# Patient Record
Sex: Female | Born: 1935
Health system: Southern US, Community
[De-identification: ages and names within clinical notes are randomized; demographics above are authoritative.]

## PROBLEM LIST (undated history)

## (undated) DIAGNOSIS — M5136 Other intervertebral disc degeneration, lumbar region: Secondary | ICD-10-CM

## (undated) DIAGNOSIS — I10 Essential (primary) hypertension: Secondary | ICD-10-CM

## (undated) DIAGNOSIS — M8580 Other specified disorders of bone density and structure, unspecified site: Secondary | ICD-10-CM

## (undated) DIAGNOSIS — C73 Malignant neoplasm of thyroid gland: Secondary | ICD-10-CM

## (undated) DIAGNOSIS — E041 Nontoxic single thyroid nodule: Secondary | ICD-10-CM

## (undated) DIAGNOSIS — M797 Fibromyalgia: Secondary | ICD-10-CM

## (undated) DIAGNOSIS — R51 Headache: Secondary | ICD-10-CM

## (undated) DIAGNOSIS — R42 Dizziness and giddiness: Secondary | ICD-10-CM

## (undated) DIAGNOSIS — R001 Bradycardia, unspecified: Secondary | ICD-10-CM

## (undated) DIAGNOSIS — K649 Unspecified hemorrhoids: Secondary | ICD-10-CM

## (undated) DIAGNOSIS — M1990 Unspecified osteoarthritis, unspecified site: Secondary | ICD-10-CM

## (undated) DIAGNOSIS — R0789 Other chest pain: Secondary | ICD-10-CM

## (undated) DIAGNOSIS — K5730 Diverticulosis of large intestine without perforation or abscess without bleeding: Secondary | ICD-10-CM

## (undated) DIAGNOSIS — H04123 Dry eye syndrome of bilateral lacrimal glands: Secondary | ICD-10-CM

## (undated) DIAGNOSIS — F419 Anxiety disorder, unspecified: Secondary | ICD-10-CM

## (undated) DIAGNOSIS — R55 Syncope and collapse: Secondary | ICD-10-CM

## (undated) DIAGNOSIS — M51369 Other intervertebral disc degeneration, lumbar region without mention of lumbar back pain or lower extremity pain: Secondary | ICD-10-CM

## (undated) DIAGNOSIS — E876 Hypokalemia: Secondary | ICD-10-CM

## (undated) DIAGNOSIS — K225 Diverticulum of esophagus, acquired: Secondary | ICD-10-CM

## (undated) DIAGNOSIS — N179 Acute kidney failure, unspecified: Secondary | ICD-10-CM

## (undated) DIAGNOSIS — K222 Esophageal obstruction: Secondary | ICD-10-CM

## (undated) DIAGNOSIS — R519 Headache, unspecified: Secondary | ICD-10-CM

## (undated) DIAGNOSIS — E039 Hypothyroidism, unspecified: Secondary | ICD-10-CM

## (undated) HISTORY — DX: Esophageal obstruction: K22.2

## (undated) HISTORY — DX: Malignant neoplasm of thyroid gland: C73

## (undated) HISTORY — DX: Nontoxic single thyroid nodule: E04.1

## (undated) HISTORY — DX: Bradycardia, unspecified: R00.1

## (undated) HISTORY — DX: Anxiety disorder, unspecified: F41.9

## (undated) HISTORY — DX: Other intervertebral disc degeneration, lumbar region: M51.36

## (undated) HISTORY — PX: BREAST SURGERY: SHX581

## (undated) HISTORY — DX: Syncope and collapse: R55

## (undated) HISTORY — DX: Headache: R51

## (undated) HISTORY — DX: Unspecified hemorrhoids: K64.9

## (undated) HISTORY — DX: Dry eye syndrome of bilateral lacrimal glands: H04.123

## (undated) HISTORY — DX: Diverticulosis of large intestine without perforation or abscess without bleeding: K57.30

## (undated) HISTORY — DX: Diverticulum of esophagus, acquired: K22.5

## (undated) HISTORY — DX: Acute kidney failure, unspecified: N17.9

## (undated) HISTORY — DX: Hypothyroidism, unspecified: E03.9

## (undated) HISTORY — DX: Other specified disorders of bone density and structure, unspecified site: M85.80

## (undated) HISTORY — DX: Other chest pain: R07.89

## (undated) HISTORY — DX: Hypokalemia: E87.6

## (undated) HISTORY — PX: ABDOMINAL HYSTERECTOMY: SHX81

## (undated) HISTORY — DX: Headache, unspecified: R51.9

## (undated) HISTORY — DX: Unspecified osteoarthritis, unspecified site: M19.90

## (undated) HISTORY — DX: Dizziness and giddiness: R42

## (undated) HISTORY — DX: Fibromyalgia: M79.7

## (undated) HISTORY — PX: CHOLECYSTECTOMY: SHX55

## (undated) HISTORY — DX: Other intervertebral disc degeneration, lumbar region without mention of lumbar back pain or lower extremity pain: M51.369

---

## 2004-08-20 ENCOUNTER — Ambulatory Visit: Payer: Self-pay | Admitting: Internal Medicine

## 2006-03-25 ENCOUNTER — Ambulatory Visit: Payer: Self-pay | Admitting: Internal Medicine

## 2007-02-10 ENCOUNTER — Ambulatory Visit: Payer: Self-pay

## 2008-01-28 ENCOUNTER — Emergency Department: Payer: Self-pay | Admitting: Emergency Medicine

## 2008-04-05 ENCOUNTER — Ambulatory Visit: Payer: Self-pay | Admitting: Internal Medicine

## 2009-10-16 ENCOUNTER — Ambulatory Visit: Payer: Self-pay | Admitting: Family Medicine

## 2009-10-17 ENCOUNTER — Encounter: Admission: RE | Admit: 2009-10-17 | Discharge: 2009-10-17 | Payer: Self-pay | Admitting: Family Medicine

## 2010-02-20 ENCOUNTER — Ambulatory Visit: Payer: Self-pay | Admitting: Physician Assistant

## 2014-02-16 ENCOUNTER — Encounter (HOSPITAL_COMMUNITY): Payer: Self-pay | Admitting: Emergency Medicine

## 2014-02-16 ENCOUNTER — Observation Stay (HOSPITAL_COMMUNITY)
Admission: EM | Admit: 2014-02-16 | Discharge: 2014-02-17 | Disposition: A | Discharge status: Home / Self Care | Payer: Medicare Other | Attending: Family Medicine | Admitting: Family Medicine

## 2014-02-16 ENCOUNTER — Observation Stay (HOSPITAL_COMMUNITY): Payer: Medicare Other

## 2014-02-16 DIAGNOSIS — R55 Syncope and collapse: Secondary | ICD-10-CM

## 2014-02-16 DIAGNOSIS — R5381 Other malaise: Secondary | ICD-10-CM | POA: Diagnosis present

## 2014-02-16 DIAGNOSIS — R001 Bradycardia, unspecified: Secondary | ICD-10-CM

## 2014-02-16 DIAGNOSIS — I10 Essential (primary) hypertension: Secondary | ICD-10-CM | POA: Diagnosis not present

## 2014-02-16 DIAGNOSIS — I498 Other specified cardiac arrhythmias: Secondary | ICD-10-CM | POA: Diagnosis not present

## 2014-02-16 DIAGNOSIS — R5383 Other fatigue: Secondary | ICD-10-CM

## 2014-02-16 HISTORY — DX: Bradycardia, unspecified: R00.1

## 2014-02-16 HISTORY — DX: Syncope and collapse: R55

## 2014-02-16 HISTORY — DX: Essential (primary) hypertension: I10

## 2014-02-16 LAB — BASIC METABOLIC PANEL
ANION GAP: 14 (ref 5–15)
BUN: 13 mg/dL (ref 6–23)
CALCIUM: 10.1 mg/dL (ref 8.4–10.5)
CO2: 27 mEq/L (ref 19–32)
Chloride: 97 mEq/L (ref 96–112)
Creatinine, Ser: 0.96 mg/dL (ref 0.50–1.10)
GFR calc Af Amer: 65 mL/min — ABNORMAL LOW (ref >90)
GFR, EST NON AFRICAN AMERICAN: 56 mL/min — AB (ref >90)
Glucose, Bld: 105 mg/dL — ABNORMAL HIGH (ref 70–99)
Potassium: 3.8 mEq/L (ref 3.7–5.3)
SODIUM: 138 meq/L (ref 137–147)

## 2014-02-16 LAB — TROPONIN I

## 2014-02-16 LAB — URINALYSIS, ROUTINE W REFLEX MICROSCOPIC
Bilirubin Urine: NEGATIVE
Glucose, UA: NEGATIVE mg/dL
HGB URINE DIPSTICK: NEGATIVE
Ketones, ur: NEGATIVE mg/dL
Leukocytes, UA: NEGATIVE
NITRITE: NEGATIVE
Protein, ur: NEGATIVE mg/dL
Specific Gravity, Urine: 1.016 (ref 1.005–1.030)
Urobilinogen, UA: 0.2 mg/dL (ref 0.0–1.0)
pH: 5 (ref 5.0–8.0)

## 2014-02-16 LAB — CBC
HEMATOCRIT: 38.4 % (ref 36.0–46.0)
HEMOGLOBIN: 13.1 g/dL (ref 12.0–15.0)
MCH: 23.8 pg — ABNORMAL LOW (ref 26.0–34.0)
MCHC: 34.1 g/dL (ref 30.0–36.0)
MCV: 69.8 fL — AB (ref 78.0–100.0)
PLATELETS: 212 10*3/uL (ref 150–400)
RBC: 5.5 MIL/uL — AB (ref 3.87–5.11)
RDW: 14.9 % (ref 11.5–15.5)
WBC: 4.5 10*3/uL (ref 4.0–10.5)

## 2014-02-16 LAB — I-STAT TROPONIN, ED: Troponin i, poc: 0.09 ng/mL (ref 0.00–0.08)

## 2014-02-16 MED ORDER — ACETAMINOPHEN 650 MG RE SUPP: 650.0000 mg | Freq: Four times a day (QID) | RECTAL | Status: DC | PRN

## 2014-02-16 MED ORDER — ONDANSETRON HCL 4 MG/2ML IJ SOLN: 4.0000 mg | Freq: Four times a day (QID) | INTRAMUSCULAR | Status: DC | PRN

## 2014-02-16 MED ORDER — LOSARTAN POTASSIUM 50 MG PO TABS
100.0000 mg | ORAL_TABLET | Freq: Every day | ORAL | Status: DC
  Administered 2014-02-17: 100 mg via ORAL
  Filled 2014-02-16: qty 2

## 2014-02-16 MED ORDER — ENOXAPARIN SODIUM 40 MG/0.4ML ~~LOC~~ SOLN
40.0000 mg | SUBCUTANEOUS | Status: DC
  Administered 2014-02-16: 40 mg via SUBCUTANEOUS
  Filled 2014-02-16 (×2): qty 0.4

## 2014-02-16 MED ORDER — LOSARTAN POTASSIUM-HCTZ 100-25 MG PO TABS: 1.0000 | ORAL_TABLET | Freq: Every day | ORAL | Status: DC

## 2014-02-16 MED ORDER — HYDROCHLOROTHIAZIDE 25 MG PO TABS
25.0000 mg | ORAL_TABLET | Freq: Every day | ORAL | Status: DC
  Administered 2014-02-17: 25 mg via ORAL
  Filled 2014-02-16: qty 1

## 2014-02-16 MED ORDER — SODIUM CHLORIDE 0.9 % IJ SOLN: 3.0000 mL | Freq: Two times a day (BID) | INTRAMUSCULAR | Status: DC

## 2014-02-16 MED ORDER — ONDANSETRON HCL 4 MG PO TABS: 4.0000 mg | ORAL_TABLET | Freq: Four times a day (QID) | ORAL | Status: DC | PRN

## 2014-02-16 MED ORDER — SODIUM CHLORIDE 0.9 % IJ SOLN
3.0000 mL | Freq: Two times a day (BID) | INTRAMUSCULAR | Status: DC
Start: 2014-02-16 — End: 2014-02-17
  Administered 2014-02-16 – 2014-02-17 (×2): 3 mL via INTRAVENOUS

## 2014-02-16 MED ORDER — HYDRALAZINE HCL 20 MG/ML IJ SOLN: 10.0000 mg | INTRAMUSCULAR | Status: DC | PRN

## 2014-02-16 MED ORDER — ACETAMINOPHEN 325 MG PO TABS: 650.0000 mg | ORAL_TABLET | Freq: Four times a day (QID) | ORAL | Status: DC | PRN

## 2014-02-16 NOTE — ED Provider Notes (Signed)
CSN: [id number]     Arrival date & time 02/16/14  1326 History   First MD Initiated Contact with Patient 02/16/14 [phone]     Chief Complaint  Patient presents with  . Fatigue     (Consider location/radiation/quality/duration/timing/severity/associated sxs/prior Treatment) HPI Shannon Ortiz 78 y.o. with a PMH of HTN presents presents for concern of symptomatic bradycardia. She has been having generalized weakness over the past week, which has been worsening. This is moderate in severity. No known exacerbating or relieving factors. She was seen by a provider (an NP) at her home 2 days ago for a routine evaluation. She reported lightheadedness at that time and reportedly had a HR of 42. Hr BP was reportedly normal as well. She was seen by her PCP (who is not in the system) today who evaluated her and told her she had a "slow heart rate" and instructed her to go to the ED for evaluation of this. She was on metoprolol for her HTN, which was stopped in the past 24 hours. She is also on a combination medication, valsartan and HCTZ, for HTN as well. No history of cardiac disease, abn stress tests. No chest pain, N/V/D, abdominal pain, LE swelling, fever, cough, palpations. Denies any history of DM, HLP as well. No history of smoking or excessive alcohol use.   Past Medical History  Diagnosis Date  . Hypertension    Past Surgical History  Procedure Laterality Date  . Cholecystectomy     No family history on file. History  Substance Use Topics  . Smoking status: Never Smoker   . Smokeless tobacco: Not on file  . Alcohol Use: No   OB History   Grav Para Term Preterm Abortions TAB SAB Ect Mult Living                 Review of Systems  All other systems reviewed and are negative.     Allergies  Review of patient's allergies indicates no known allergies.  Home Medications   Prior to Admission medications   Not on File   BP 176/79  Pulse 62  Temp(Src) 99 F (37.2 C) (Oral)  Resp 18   Ht 5\' 7"  (1.702 m)  Wt 220 lb (99.791 kg)  BMI 34.45 kg/m2  SpO2 97% Physical Exam  Constitutional: She is oriented to person, place, and time. She appears well-developed and well-nourished. No distress.  HENT:  Head: Normocephalic and atraumatic.  Right Ear: External ear normal.  Left Ear: External ear normal.  Eyes: Conjunctivae and EOM are normal. Right eye exhibits no discharge. Left eye exhibits no discharge.  Neck: Normal range of motion. Neck supple. No JVD present.  Cardiovascular: Normal rate, regular rhythm and normal heart sounds.  Exam reveals no gallop and no friction rub.   No murmur heard. HR decreased on tele intermittently to 45 bpm, which appeared sinus  Pulmonary/Chest: Effort normal and breath sounds normal. No stridor. No respiratory distress. She has no wheezes. She has no rales. She exhibits no tenderness.  Abdominal: Soft. Bowel sounds are normal. She exhibits no distension. There is no tenderness. There is no rebound and no guarding.  Musculoskeletal: Normal range of motion. She exhibits no edema.  Neurological: She is alert and oriented to person, place, and time.  Skin: Skin is warm. No rash noted. She is not diaphoretic.  Psychiatric: She has a normal mood and affect. Her behavior is normal.    ED Course  Procedures (including critical care time) Labs Review Labs  Reviewed  CBC - Abnormal; Notable for the following:    RBC 5.50 (*)    MCV 69.8 (*)    MCH 23.8 (*)    All other components within normal limits  BASIC METABOLIC PANEL - Abnormal; Notable for the following:    Glucose, Bld 105 (*)    GFR calc non Af Amer 56 (*)    GFR calc Af Amer 65 (*)    All other components within normal limits  I-STAT TROPOININ, ED - Abnormal; Notable for the following:    Troponin i, poc 0.09 (*)    All other components within normal limits  URINALYSIS, ROUTINE W REFLEX MICROSCOPIC  TROPONIN I    Imaging Review No results found.   EKG  Interpretation   Date/Time:  Friday February 16 2014 13:58:38 EDT Ventricular Rate:  68 PR Interval:  178 QRS Duration: 96 QT Interval:  430 QTC Calculation: 457 R Axis:   -53 Text Interpretation:  Sinus rhythm with occasional ventricular-paced  complexes and Premature supraventricular complexes Left axis deviation  Abnormal ECG No significant change since last tracing Confirmed by ALLEN   MD, ANTHONY ([id number]) on 02/16/2014 5:16:40 PM      MDM   Final diagnoses:  None    Pt presents with suspected symptomatic bradycardia. HR reportedly as low as 42 in the past 2 days. Stopped the beta blocker in the past 24 hours. No chest pain. No specific exertional symptoms. HR down as low as 45 on tele when I was evaluating her. No resp distress. AFVSS. NAD. Normotensive. Trop elevated on istat and repeat in lab was neg. ECG neg for ischemic changes. No electrolyte abn suggestive of of etiology. No chest pain. Low suspicion for ACS. Likely bradycardia secondary to metoprolol. Admission for telemetry observation indicated to ensure bradycardia resolved with cessation of the beta blocker. No other AV nodal blocking agents. Discussed case with Dr. Harrington Challenger, with cardiology, who agreed a medicine admission is reasonable as this was likely related to the beta blocker. Patient was admitted to the hospitalist. Care discussed with my attending, Dr. Zenia Resides.     Kelby Aline, MD 02/17/14 [phone]

## 2014-02-16 NOTE — ED Notes (Signed)
Dinner tray ordered for patient.

## 2014-02-16 NOTE — ED Notes (Signed)
Pt reports two day hx of fatigue. Home health RN came out to house yesterday and pulse was low. Pt reports some dizziness with standing. Denies chest pain, SOB. States PMD sent over for further eval. Pt awake, alert, oriented x4, NAD at present.

## 2014-02-16 NOTE — H&P (Signed)
[facility] History and Physical  Lorre Opdahl DGL:[id number] DOB: 1935-11-24 DOA: 02/16/2014  Referring physician: ER physician. PCP:  Melinda Crutch, MD   Chief Complaint: Dizziness.  HPI: Shannon Ortiz is a 78 y.o. female with history of hypertension was having a routine checkup by her nurse yesterday when she was found to be bradycardic. At that time patient stated that she has been feeling dizzy off and on for last one week particularly on exertion. Patient was instructed to discontinue her Toprol. Patient had followed up with her PCP and was referred to the ER. In the ER patient was found to be in sinus bradycardia still. Patient's heart rate sometimes drops to 40 beats per minute on the monitor. Initial point of care troponin was positive but regular troponin was negative. Patient denies any chest pain headache visual symptoms focal deficits nausea vomiting diarrhea fever chills. On-call cardiologist Dr. Harrington Challenger at this time has advised observation. Patient has not been taking her Toprol for the last 24 hours.   Review of Systems: As presented in the history of presenting illness, rest negative.  Past Medical History  Diagnosis Date  . Hypertension    Past Surgical History  Procedure Laterality Date  . Cholecystectomy     Social History:  reports that she has never smoked. She does not have any smokeless tobacco history on file. She reports that she does not drink alcohol or use illicit drugs. Where does patient live home. Can patient participate in ADLs? Yes.  No Known Allergies  Family History:  Family History  Problem Relation Age of Onset  . CAD Brother   . Stroke Other       Prior to Admission medications   Medication Sig Start Date End Date Taking? Authorizing Provider  losartan-hydrochlorothiazide (HYZAAR) 100-25 MG per tablet Take 1 tablet by mouth daily.   Yes Historical Provider, MD    Physical Exam: Filed Vitals:   02/16/14 1700 02/16/14 1740 02/16/14 1811  02/16/14 1819  BP: 159/72  143/78 143/78  Pulse: 44  48 47  Temp:  98.7 F (37.1 C)    TempSrc:  Oral    Resp: 19  15 20   Height:      Weight:      SpO2: 98%  96% 96%     General:  Well-developed and nourished.  Eyes: Anicteric no pallor.  ENT: No discharge from ears eyes nose mouth.  Neck: No mass felt.  Cardiovascular: S1-S2 heard bradycardic.  Respiratory: No rhonchi or crepitations.  Abdomen: Soft nontender bowel sounds present. No guarding or rigidity.  Skin: No rash.  Musculoskeletal: No edema.  Psychiatric: Appears normal.  Neurologic: Alert awake oriented to time place and person. Moves all extremities 5 x 5. No facial asymmetry. Tongue is midline.  Labs on Admission:  Basic Metabolic Panel:  Recent Labs Lab 02/16/14 1625  NA 138  K 3.8  CL 97  CO2 27  GLUCOSE 105*  BUN 13  CREATININE 0.96  CALCIUM 10.1   Liver Function Tests: No results found for this basename: AST, ALT, ALKPHOS, BILITOT, PROT, ALBUMIN,  in the last 168 hours No results found for this basename: LIPASE, AMYLASE,  in the last 168 hours No results found for this basename: AMMONIA,  in the last 168 hours CBC:  Recent Labs Lab 02/16/14 1625  WBC 4.5  HGB 13.1  HCT 38.4  MCV 69.8*  PLT 212   Cardiac Enzymes:  Recent Labs Lab 02/16/14 1625  TROPONINI <0.30  BNP (last 3 results) No results found for this basename: PROBNP,  in the last 8760 hours CBG: No results found for this basename: GLUCAP,  in the last 168 hours  Radiological Exams on Admission: No results found.  EKG: Independently reviewed. No sinus rhythm with heart rate around 60 beats per minute with U waves.  Assessment/Plan Principal Problem:   Near syncope Active Problems:   Bradycardia   HTN (hypertension)   1. Near-syncope/dizziness - probably related to bradycardia. Patient's heart rate at this time wavers from 40-50 beats per minute on the monitor. Toprol has been discontinued for last 24  hours. Continue to monitor in telemetry. Check TSH. Check orthostatics. I have personally discussed with on-call cardiologist Dr. Harrington Challenger both the EKG and patient's plan of care. If patient did remain bradycardic and symptomatic in consult cardiology again in a.m. 2. Hypertension - on Hyzaar. Discontinued Toprol. Closely observe blood pressure trends. When necessary IV hydralazine.  Chest x-ray is pending. Since patient's initial point of care troponin was positive but regular troponin is negative we will cycle cardiac markers.    Code Status: Full code.  Family Communication: None.  Disposition Plan: Admit for observation.    Danie Diehl N. [facility] Pager [phone].  If 7PM-7AM, please contact night-coverage [email or web address] Password [email or web address] 02/16/2014, 7:11 PM

## 2014-02-16 NOTE — ED Provider Notes (Signed)
I saw and evaluated the patient, reviewed the resident's note and I agree with the findings and plan.   EKG Interpretation   Date/Time:  Friday February 16 2014 13:58:38 EDT Ventricular Rate:  68 PR Interval:  178 QRS Duration: 96 QT Interval:  430 QTC Calculation: 457 R Axis:   -53 Text Interpretation:  Sinus rhythm with occasional ventricular-paced  complexes and Premature supraventricular complexes Left axis deviation  Abnormal ECG No significant change since last tracing Confirmed by Siennah Barrasso   MD, Khalfani Weideman ([id number]) on 02/16/2014 5:16:40 PM     Patient here complaining of increased fatigue he was noted to be bradycardic at her physician's office. No syncope noted. Elevated troponin here. Will have cardiology consult and likely medicine admission  Leota Jacobsen, MD 02/16/14 1717

## 2014-02-16 NOTE — ED Notes (Signed)
Dr. Royetta Crochet notified of elevated troponin

## 2014-02-16 NOTE — ED Notes (Signed)
Patient and family updated on plan of care, aware that patient will be admitted for further monitoring.

## 2014-02-17 LAB — CBC
HCT: 35.2 % — ABNORMAL LOW (ref 36.0–46.0)
Hemoglobin: 11.7 g/dL — ABNORMAL LOW (ref 12.0–15.0)
MCH: 23.8 pg — ABNORMAL LOW (ref 26.0–34.0)
MCHC: 33.2 g/dL (ref 30.0–36.0)
MCV: 71.5 fL — AB (ref 78.0–100.0)
PLATELETS: 173 10*3/uL (ref 150–400)
RBC: 4.92 MIL/uL (ref 3.87–5.11)
RDW: 15.1 % (ref 11.5–15.5)
WBC: 4.4 10*3/uL (ref 4.0–10.5)

## 2014-02-17 LAB — BASIC METABOLIC PANEL
Anion gap: 11 (ref 5–15)
BUN: 13 mg/dL (ref 6–23)
CALCIUM: 9.8 mg/dL (ref 8.4–10.5)
CO2: 27 mEq/L (ref 19–32)
Chloride: 100 mEq/L (ref 96–112)
Creatinine, Ser: 1 mg/dL (ref 0.50–1.10)
GFR, EST AFRICAN AMERICAN: 62 mL/min — AB (ref >90)
GFR, EST NON AFRICAN AMERICAN: 54 mL/min — AB (ref >90)
Glucose, Bld: 96 mg/dL (ref 70–99)
Potassium: 3.3 mEq/L — ABNORMAL LOW (ref 3.7–5.3)
SODIUM: 138 meq/L (ref 137–147)

## 2014-02-17 LAB — TSH: TSH: 2.27 u[IU]/mL (ref 0.350–4.500)

## 2014-02-17 LAB — TROPONIN I: Troponin I: <0.3 ng/mL (ref <0.30)

## 2014-02-17 NOTE — Discharge Summary (Signed)
Physician Discharge Summary  Shannon Ortiz KXF:[id number] DOB: 08-25-1935 DOA: 02/16/2014  PCP:  Melinda Crutch, MD  Admit date: 02/16/2014 Discharge date: 02/17/2014  Time spent: > 35 minutes  Recommendations for Outpatient Follow-up:  1. Please continue to monitor BP and adjust antihypertensive medication accordingly  Discharge Diagnoses:  Principal Problem:   Near syncope Active Problems:   Bradycardia   HTN (hypertension)   Discharge Condition: stable  Diet recommendation: heart healthy  Filed Weights   02/16/14 1341 02/16/14 2014 02/17/14 0520  Weight: 99.791 kg (220 lb) 100.4 kg (221 lb 5.5 oz) 100.2 kg (220 lb 14.4 oz)    History of present illness:  78 y/o female with h/o htn on B blocker at home that presented with dizziness and bradycardia.  Hospital Course:  Principal Problem:   Near syncope - Resolved - Ambulated with PT with no new complaints.  - They have recommended home health PT and Rolling Bellisario  Active Problems:   Bradycardia - resolved on last check 79, may have been due to toprol. Will discontinue B blocker on discharge    HTN (hypertension) - Currently on losartan and hctz - discontinue B blocker.  Procedures:  None  Consultations:  Physical therapist  Discharge Exam: Filed Vitals:   02/17/14 1322  BP: 132/72  Pulse: 79  Temp: 98.5 F (36.9 C)  Resp: 19    General: Pt in nad, alert and awake Cardiovascular: rrr, no mrg Respiratory: cta bl, no wheezes  Discharge Instructions You were cared for by a hospitalist during your hospital stay. If you have any questions about your discharge medications or the care you received while you were in the hospital after you are discharged, you can call the unit and asked to speak with the hospitalist on call if the hospitalist that took care of you is not available. Once you are discharged, your primary care physician will handle any further medical issues. Please note that NO REFILLS for any discharge  medications will be authorized once you are discharged, as it is imperative that you return to your primary care physician (or establish a relationship with a primary care physician if you do not have one) for your aftercare needs so that they can reassess your need for medications and monitor your lab values.  Discharge Instructions   Diet - low sodium heart healthy    Complete by:  As directed      Discharge instructions    Complete by:  As directed   Discharge home with home health PT     Increase activity slowly    Complete by:  As directed           Current Discharge Medication List    CONTINUE these medications which have NOT CHANGED   Details  losartan-hydrochlorothiazide (HYZAAR) 100-25 MG per tablet Take 1 tablet by mouth daily.       No Known Allergies    The results of significant diagnostics from this hospitalization (including imaging, microbiology, ancillary and laboratory) are listed below for reference.    Significant Diagnostic Studies: Dg Chest Port 1 View  02/17/2014   CLINICAL DATA:  Infiltrates.  EXAM: PORTABLE CHEST - 1 VIEW  COMPARISON:  None.  FINDINGS: The cardiac silhouette appears at least mildly enlarged, even with consideration to this low inspiratory portable examination. Tortuous aorta. Retrocardiac airspace opacity. No pleural effusions. No pneumothorax.  Large body habitus.  Osseous structures are nonsuspicious.  IMPRESSION: Mild cardiomegaly. Retrocardiac consolidation. Consider PA and lateral views of the  chest when clinically able.   Electronically Signed   By: Elon Alas   On: 02/17/2014 01:27    Microbiology: No results found for this or any previous visit (from the past 240 hour(s)).   Labs: Basic Metabolic Panel:  Recent Labs Lab 02/16/14 1625 02/17/14 0353  NA 138 138  K 3.8 3.3*  CL 97 100  CO2 27 27  GLUCOSE 105* 96  BUN 13 13  CREATININE 0.96 1.00  CALCIUM 10.1 9.8   Liver Function Tests: No results found for this  basename: AST, ALT, ALKPHOS, BILITOT, PROT, ALBUMIN,  in the last 168 hours No results found for this basename: LIPASE, AMYLASE,  in the last 168 hours No results found for this basename: AMMONIA,  in the last 168 hours CBC:  Recent Labs Lab 02/16/14 1625 02/17/14 0353  WBC 4.5 4.4  HGB 13.1 11.7*  HCT 38.4 35.2*  MCV 69.8* 71.5*  PLT 212 173   Cardiac Enzymes:  Recent Labs Lab 02/16/14 1625 02/16/14 2359 02/17/14 0353 02/17/14 1010  TROPONINI <0.30 <0.30 <0.30 <0.30   BNP: BNP (last 3 results) No results found for this basename: PROBNP,  in the last 8760 hours CBG: No results found for this basename: GLUCAP,  in the last 168 hours     Signed:  Velvet Bathe  [facility] 02/17/2014, 4:00 PM

## 2014-02-17 NOTE — Evaluation (Signed)
Physical Therapy Evaluation Patient Details Name: Shannon Ortiz MRN: [id number] DOB: 1935-06-18 Today's Date: 02/17/2014   History of Present Illness  Shannon Ortiz is a 78 y.o. female with history of hypertension was having a routine checkup by her nurse yesterday when she was found to be bradycardic. At that time patient stated that she has been feeling dizzy off and on for last one week  and now presents with pulses down to 40 at times.  Chest x-ray show s cardiomegaly with retrocardiac consolidation.  Clinical Impression  Pt was demonstrating a limited tolerance for gait without a Laton and more stable with better distance when using a Prew.  Has decreased safety with no AD but is not committed to the use of one at home.  Recommending HHPT which she is in agreement to having.    Follow Up Recommendations Outpatient PT;Home health PT;Supervision/Assistance - 24 hour    Equipment Recommendations  Rolling Lashley with 5" wheels;3in1 (PT)    Recommendations for Other Services Other (comment) (HHPT vs outpatient)     Precautions / Restrictions Precautions Precautions: Fall Restrictions Weight Bearing Restrictions: No Other Position/Activity Restrictions: Pt asked to walk only with AD.      Mobility  Bed Mobility Overal bed mobility: Modified Independent                Transfers Overall transfer level: Modified independent Equipment used: Rolling Novelo (2 wheeled);None             General transfer comment: Slower and needs more than one attempt to stand from lower heights.  Ambulation/Gait Ambulation/Gait assistance: Supervision Ambulation Distance (Feet): 200 Feet Assistive device: Rolling Odonovan (2 wheeled);None Gait Pattern/deviations: Decreased step length - right;Decreased step length - left;Step-through pattern;Wide base of support;Drifts right/left Gait velocity: slower without Elena Gait velocity interpretation: Below normal speed for age/gender General  Gait Details: shorter steps with more lateral shift esp to R side without AD. Pt is non committal about using Balthazar despite PT discussion with her.  Stairs            Wheelchair Mobility    Modified Rankin (Stroke Patients Only)       Balance Overall balance assessment: Modified Independent                                           Pertinent Vitals/Pain Pain Assessment: No/denies pain BP was 132/72, pulse 79 and sat 97% with nsg report.    Home Living Family/patient expects to be discharged to:: Private residence Living Arrangements: Children Available Help at Discharge: Family;Other (Comment) (Evening help only) Type of Home: House Home Access: Stairs to enter Entrance Stairs-Rails: Right;Left;Can reach both Entrance Stairs-Number of Steps: 3 Home Layout: One level Home Equipment: None Additional Comments: Pt is conflicted about getting equipment    Prior Function Level of Independence: Independent               Hand Dominance   Dominant Hand: Right    Extremity/Trunk Assessment   Upper Extremity Assessment: Overall WFL for tasks assessed           Lower Extremity Assessment: Generalized weakness      Cervical / Trunk Assessment: Normal  Communication   Communication: No difficulties  Cognition Arousal/Alertness: Awake/alert Behavior During Therapy: WFL for tasks assessed/performed Overall Cognitive Status: Within Functional Limits for tasks assessed  General Comments General comments (skin integrity, edema, etc.): Pt has increasing evidence of risk of falling with comparison of gait with and without Glodowski.  Walks farther and with more lateral stability with [place].  Has increased time to complete her mobility with unsteady moments in turning     Exercises        Assessment/Plan    PT Assessment All further PT needs can be met in the next venue of care  PT Diagnosis Difficulty walking    PT Problem List Decreased strength;Decreased range of motion;Decreased activity tolerance;Decreased balance;Decreased mobility;Decreased coordination;Decreased knowledge of use of DME;Decreased safety awareness;Cardiopulmonary status limiting activity;Obesity  PT Treatment Interventions     PT Goals (Current goals can be found in the Care Plan section) Acute Rehab PT Goals Patient Stated Goal: Wants to go home today PT Goal Formulation: With patient Time For Goal Achievement: 02/24/14 Potential to Achieve Goals: Good    Frequency     Barriers to discharge        Co-evaluation               End of Session Equipment Utilized During Treatment: Other (comment) (FWW) Activity Tolerance: Patient limited by fatigue Patient left: in chair;with call bell/phone within reach Nurse Communication: Mobility status;Other (comment) (Discharge information)         Time: 3710-6269 PT Time Calculation (min): 40 min   Charges:   PT Evaluation $Initial PT Evaluation Tier I: 1 Procedure PT Treatments $Gait Training: 8-22 mins $Neuromuscular Re-education: 8-22 mins   PT G Codes:          Ramond Dial 2014-03-08, 2:49 PM  Mee Hives, PT MS Acute Rehab Dept. Number: [phone]

## 2014-02-17 NOTE — Progress Notes (Signed)
UR completed 

## 2014-02-18 NOTE — ED Provider Notes (Signed)
I saw and evaluated the patient, reviewed the resident's note and I agree with the findings and plan.   EKG Interpretation   Date/Time:  Friday February 16 2014 13:58:38 EDT Ventricular Rate:  68 PR Interval:  178 QRS Duration: 96 QT Interval:  430 QTC Calculation: 457 R Axis:   -53 Text Interpretation:  Sinus rhythm with occasional ventricular-paced  complexes and Premature supraventricular complexes Left axis deviation  Abnormal ECG No significant change since last tracing Confirmed by Zenia Resides   MD, Osie Amparo ([id number]) on 02/16/2014 5:16:40 PM       Leota Jacobsen, MD 02/18/14 [phone]

## 2014-03-01 NOTE — Progress Notes (Signed)
Late entry for missed G-code. Based on review of evaluation and goals of Mee Hives, PT.  02-22-14 1500  PT G-Codes **NOT FOR INPATIENT CLASS**  Functional Assessment Tool Used clinical judgement based on chart review  Functional Limitation Mobility: Walking and moving around  Mobility: Walking and Moving Around Current Status [phone]) CI  Mobility: Walking and Moving Around Goal Status [phone]) CI  Mobility: Walking and Moving Around Discharge Status [phone]) CI  Lavonia Dana, [place]  [phone] 03/01/2014

## 2018-08-10 ENCOUNTER — Other Ambulatory Visit (HOSPITAL_BASED_OUTPATIENT_CLINIC_OR_DEPARTMENT_OTHER): Payer: Self-pay | Admitting: Family Medicine

## 2018-08-10 DIAGNOSIS — R1013 Epigastric pain: Secondary | ICD-10-CM

## 2018-08-17 ENCOUNTER — Ambulatory Visit (INDEPENDENT_AMBULATORY_CARE_PROVIDER_SITE_OTHER): Payer: Medicare Other | Admitting: Orthopaedic Surgery

## 2018-08-17 ENCOUNTER — Ambulatory Visit (INDEPENDENT_AMBULATORY_CARE_PROVIDER_SITE_OTHER): Payer: Medicare Other

## 2018-08-17 ENCOUNTER — Encounter (INDEPENDENT_AMBULATORY_CARE_PROVIDER_SITE_OTHER): Payer: Self-pay | Admitting: Orthopaedic Surgery

## 2018-08-17 DIAGNOSIS — M25552 Pain in left hip: Secondary | ICD-10-CM

## 2018-08-17 NOTE — Progress Notes (Signed)
Office Visit Note   Patient: Shannon Ortiz           Date of Birth: 23-Feb-1936           MRN: [id number] Visit Date: 08/17/2018              Requested by: Lawerance Cruel, [place], [place] [address] PCP: Lawerance Cruel, MD   Assessment & Plan: Visit Diagnoses:  1. Pain in left hip     Plan: We discussed with patient the clinical and radiographic findings.  She understands that she has arthritic changes and possible cystic changes within the hip that may be consistent with AVN.  However basically comes down to review quality of life and how much pain she has in the hip.  At this point time she is not ready to make any type decision before having a hip replacement.  She would like to follow-up with [place] on as-needed basis if her hip becomes worse or more debilitating would recommend left total hip arthroplasty.  Questions encouraged and answered at length by Dr. Ninfa Linden and myself.  Follow-Up Instructions: Return if symptoms worsen or fail to improve.   Orders:  Orders Placed This Encounter  Procedures  . XR HIP UNILAT W OR W/O PELVIS 1V LEFT   No orders of the defined types were placed in this encounter.     Procedures: No procedures performed   Clinical Data: No additional findings.   Subjective: Chief Complaint  Patient presents with  . Left Hip - Pain    HPI Shannon Ortiz is an 83 year old female comes in today for left hip pain that is been ongoing for the past couple months.  She has had no injury to the hip.  She is having some difficulty putting on her shoes and socks on the left.  She denies any groin pain.  She does have pain that radiates into her thighs never past the knees.  Pains only whenever she begins to ambulate after sitting for a while.  No radicular symptoms down the leg otherwise. Review of Systems Please see HPI otherwise negative  Objective: Vital Signs: There were no vitals taken for this visit.  Physical  Exam Constitutional:      Appearance: She is not diaphoretic.  Pulmonary:     Effort: Pulmonary effort is normal.  Neurological:     Mental Status: She is alert and oriented to person, place, and time.  Psychiatric:        Mood and Affect: Mood normal.     Ortho Exam Left hip no internal or external rotation.  No significant pain with range of motion of the hip though.  Right hip limited internal and external rotation without pain.  She has significant crepitus about the right knee with range of motion.  Left knee good range of motion without pain.  She is nontender with palpation of both knees. Specialty Comments:  No specialty comments available.  Imaging: Xr Hip Unilat W Or W/o Pelvis 1v Left  Result Date: 08/17/2018 AP pelvis lateral view left hip: No acute fractures.  Hips with significant joint space narrowing with cystic changes bilateral femoral heads.  Left femoral head appears slightly flattened compared to the right.    PMFS History: Patient Active Problem List   Diagnosis Date Noted  . Bradycardia 02/16/2014  . Near syncope 02/16/2014  . HTN (hypertension) 02/16/2014   Past Medical History:  Diagnosis Date  . Hypertension  Family History  Problem Relation Age of Onset  . CAD Brother   . Hypertension Mother   . Diabetes Mother        non-insulin dependant    Past Surgical History:  Procedure Laterality Date  . BREAST SURGERY     breast biopsy  . CHOLECYSTECTOMY     Social History   Occupational History  . Not on file  Tobacco Use  . Smoking status: Never Smoker  Substance and Sexual Activity  . Alcohol use: No  . Drug use: No  . Sexual activity: Never

## 2018-08-20 ENCOUNTER — Ambulatory Visit (HOSPITAL_BASED_OUTPATIENT_CLINIC_OR_DEPARTMENT_OTHER)
Admission: RE | Admit: 2018-08-20 | Discharge: 2018-08-20 | Disposition: A | Discharge status: Home / Self Care | Payer: Medicare Other | Source: Ambulatory Visit | Attending: Family Medicine | Admitting: Family Medicine

## 2018-08-20 DIAGNOSIS — R1013 Epigastric pain: Secondary | ICD-10-CM | POA: Diagnosis not present

## 2018-08-21 ENCOUNTER — Ambulatory Visit (HOSPITAL_BASED_OUTPATIENT_CLINIC_OR_DEPARTMENT_OTHER): Payer: Medicare Other

## 2018-08-25 ENCOUNTER — Other Ambulatory Visit (HOSPITAL_BASED_OUTPATIENT_CLINIC_OR_DEPARTMENT_OTHER): Payer: Medicare Other

## 2018-08-31 ENCOUNTER — Encounter (HOSPITAL_COMMUNITY): Payer: Self-pay

## 2018-08-31 ENCOUNTER — Emergency Department (HOSPITAL_COMMUNITY): Payer: Medicare Other

## 2018-08-31 ENCOUNTER — Observation Stay (HOSPITAL_COMMUNITY)
Admission: EM | Admit: 2018-08-31 | Discharge: 2018-09-02 | Disposition: A | Discharge status: Home / Self Care | Payer: Medicare Other | Attending: Internal Medicine | Admitting: Internal Medicine

## 2018-08-31 ENCOUNTER — Other Ambulatory Visit: Payer: Self-pay

## 2018-08-31 DIAGNOSIS — Q396 Congenital diverticulum of esophagus: Principal | ICD-10-CM | POA: Insufficient documentation

## 2018-08-31 DIAGNOSIS — R531 Weakness: Secondary | ICD-10-CM | POA: Diagnosis present

## 2018-08-31 DIAGNOSIS — R1310 Dysphagia, unspecified: Secondary | ICD-10-CM

## 2018-08-31 DIAGNOSIS — I129 Hypertensive chronic kidney disease with stage 1 through stage 4 chronic kidney disease, or unspecified chronic kidney disease: Secondary | ICD-10-CM | POA: Insufficient documentation

## 2018-08-31 DIAGNOSIS — Z79899 Other long term (current) drug therapy: Secondary | ICD-10-CM | POA: Diagnosis not present

## 2018-08-31 DIAGNOSIS — N183 Chronic kidney disease, stage 3 (moderate): Secondary | ICD-10-CM | POA: Insufficient documentation

## 2018-08-31 DIAGNOSIS — Z9049 Acquired absence of other specified parts of digestive tract: Secondary | ICD-10-CM | POA: Diagnosis not present

## 2018-08-31 DIAGNOSIS — E876 Hypokalemia: Secondary | ICD-10-CM | POA: Insufficient documentation

## 2018-08-31 DIAGNOSIS — K3189 Other diseases of stomach and duodenum: Secondary | ICD-10-CM | POA: Diagnosis not present

## 2018-08-31 DIAGNOSIS — R001 Bradycardia, unspecified: Secondary | ICD-10-CM | POA: Insufficient documentation

## 2018-08-31 DIAGNOSIS — N179 Acute kidney failure, unspecified: Secondary | ICD-10-CM

## 2018-08-31 DIAGNOSIS — K222 Esophageal obstruction: Secondary | ICD-10-CM

## 2018-08-31 DIAGNOSIS — R933 Abnormal findings on diagnostic imaging of other parts of digestive tract: Secondary | ICD-10-CM | POA: Insufficient documentation

## 2018-08-31 DIAGNOSIS — K5900 Constipation, unspecified: Secondary | ICD-10-CM | POA: Insufficient documentation

## 2018-08-31 DIAGNOSIS — R634 Abnormal weight loss: Secondary | ICD-10-CM | POA: Insufficient documentation

## 2018-08-31 DIAGNOSIS — I10 Essential (primary) hypertension: Secondary | ICD-10-CM | POA: Diagnosis present

## 2018-08-31 DIAGNOSIS — E860 Dehydration: Secondary | ICD-10-CM

## 2018-08-31 DIAGNOSIS — D509 Iron deficiency anemia, unspecified: Secondary | ICD-10-CM | POA: Insufficient documentation

## 2018-08-31 HISTORY — DX: Acute kidney failure, unspecified: N17.9

## 2018-08-31 HISTORY — DX: Hypokalemia: E87.6

## 2018-08-31 HISTORY — DX: Esophageal obstruction: K22.2

## 2018-08-31 LAB — URINALYSIS, ROUTINE W REFLEX MICROSCOPIC
Bilirubin Urine: NEGATIVE
Glucose, UA: NEGATIVE mg/dL
Hgb urine dipstick: NEGATIVE
KETONES UR: 20 mg/dL — AB
Leukocytes,Ua: NEGATIVE
Nitrite: NEGATIVE
PROTEIN: 30 mg/dL — AB
Specific Gravity, Urine: 1.017 (ref 1.005–1.030)
pH: 5 (ref 5.0–8.0)

## 2018-08-31 LAB — CBC WITH DIFFERENTIAL/PLATELET
Abs Immature Granulocytes: 0.01 10*3/uL (ref 0.00–0.07)
BASOS ABS: 0 10*3/uL (ref 0.0–0.1)
Basophils Relative: 0 %
EOS PCT: 0 %
Eosinophils Absolute: 0 10*3/uL (ref 0.0–0.5)
HCT: 37.4 % (ref 36.0–46.0)
Hemoglobin: 11.9 g/dL — ABNORMAL LOW (ref 12.0–15.0)
Immature Granulocytes: 0 %
Lymphocytes Relative: 32 %
Lymphs Abs: 1.6 10*3/uL (ref 0.7–4.0)
MCH: 22.5 pg — ABNORMAL LOW (ref 26.0–34.0)
MCHC: 31.8 g/dL (ref 30.0–36.0)
MCV: 70.8 fL — ABNORMAL LOW (ref 80.0–100.0)
Monocytes Absolute: 0.5 10*3/uL (ref 0.1–1.0)
Monocytes Relative: 10 %
NRBC: 0 % (ref 0.0–0.2)
Neutro Abs: 2.8 10*3/uL (ref 1.7–7.7)
Neutrophils Relative %: 58 %
Platelets: 228 10*3/uL (ref 150–400)
RBC: 5.28 MIL/uL — ABNORMAL HIGH (ref 3.87–5.11)
RDW: 14.9 % (ref 11.5–15.5)
WBC: 5 10*3/uL (ref 4.0–10.5)

## 2018-08-31 LAB — COMPREHENSIVE METABOLIC PANEL
ALT: 15 U/L (ref 0–44)
AST: 33 U/L (ref 15–41)
Albumin: 4.5 g/dL (ref 3.5–5.0)
Alkaline Phosphatase: 59 U/L (ref 38–126)
Anion gap: 16 — ABNORMAL HIGH (ref 5–15)
BUN: 33 mg/dL — ABNORMAL HIGH (ref 8–23)
CO2: 25 mmol/L (ref 22–32)
Calcium: 10.6 mg/dL — ABNORMAL HIGH (ref 8.9–10.3)
Chloride: 100 mmol/L (ref 98–111)
Creatinine, Ser: 1.92 mg/dL — ABNORMAL HIGH (ref 0.44–1.00)
GFR calc non Af Amer: 24 mL/min — ABNORMAL LOW (ref >60)
GFR, EST AFRICAN AMERICAN: 28 mL/min — AB (ref >60)
Glucose, Bld: 114 mg/dL — ABNORMAL HIGH (ref 70–99)
Potassium: 2.3 mmol/L — CL (ref 3.5–5.1)
Sodium: 141 mmol/L (ref 135–145)
Total Bilirubin: 1.4 mg/dL — ABNORMAL HIGH (ref 0.3–1.2)
Total Protein: 8.1 g/dL (ref 6.5–8.1)

## 2018-08-31 LAB — MAGNESIUM: Magnesium: 2.1 mg/dL (ref 1.7–2.4)

## 2018-08-31 MED ORDER — POTASSIUM CHLORIDE 10 MEQ/100ML IV SOLN
10.0000 meq | INTRAVENOUS | Status: AC
  Administered 2018-08-31 (×3): 10 meq via INTRAVENOUS
  Filled 2018-08-31 (×2): qty 100

## 2018-08-31 MED ORDER — LACTATED RINGERS IV BOLUS
1000.0000 mL | Freq: Once | INTRAVENOUS | Status: AC
  Administered 2018-08-31: 1000 mL via INTRAVENOUS

## 2018-08-31 MED ORDER — SODIUM CHLORIDE 0.9% FLUSH: 3.0000 mL | Freq: Two times a day (BID) | INTRAVENOUS | Status: DC

## 2018-08-31 MED ORDER — ACETAMINOPHEN 325 MG PO TABS: 650.0000 mg | ORAL_TABLET | Freq: Four times a day (QID) | ORAL | Status: DC | PRN

## 2018-08-31 MED ORDER — HEPARIN SODIUM (PORCINE) 5000 UNIT/ML IJ SOLN
5000.0000 [IU] | Freq: Three times a day (TID) | INTRAMUSCULAR | Status: DC
  Administered 2018-08-31 – 2018-09-02 (×5): 5000 [IU] via SUBCUTANEOUS
  Filled 2018-08-31 (×5): qty 1

## 2018-08-31 MED ORDER — POTASSIUM CHLORIDE 10 MEQ/100ML IV SOLN
10.0000 meq | INTRAVENOUS | Status: AC
  Administered 2018-08-31 (×2): 10 meq via INTRAVENOUS
  Filled 2018-08-31 (×3): qty 100

## 2018-08-31 MED ORDER — POTASSIUM CHLORIDE IN NACL 40-0.9 MEQ/L-% IV SOLN
INTRAVENOUS | Status: AC
  Administered 2018-08-31 – 2018-09-01 (×2): 100 mL/h via INTRAVENOUS
  Filled 2018-08-31 (×2): qty 1000

## 2018-08-31 MED ORDER — ACETAMINOPHEN 650 MG RE SUPP: 650.0000 mg | Freq: Four times a day (QID) | RECTAL | Status: DC | PRN

## 2018-08-31 MED ORDER — POTASSIUM CHLORIDE 20 MEQ/15ML (10%) PO SOLN
40.0000 meq | Freq: Once | ORAL | Status: AC
  Administered 2018-08-31: 40 meq via ORAL
  Filled 2018-08-31: qty 30

## 2018-08-31 MED ORDER — AMLODIPINE BESYLATE 5 MG PO TABS
5.0000 mg | ORAL_TABLET | Freq: Every day | ORAL | Status: DC
  Filled 2018-08-31: qty 1

## 2018-08-31 NOTE — ED Notes (Signed)
ED Provider at bedside. 

## 2018-08-31 NOTE — ED Notes (Addendum)
ED TO INPATIENT HANDOFF REPORT  ED Nurse Name and Phone #:  Joellen Jersey [phone]  S Name/Age/Gender Shannon Ortiz 83 y.o. female Room/Bed: 021C/021C  Code Status   Code Status: Full Code  Home/SNF/Other Home Patient oriented to: self, place, time and situation Is this baseline? Yes   Triage Complete: Triage complete  Chief Complaint unable to digest   Triage Note Pt from home, complaining of inability to eat, weakness, and constipation for approximately 2 weeks.   Allergies No Known Allergies  Level of Care/Admitting Diagnosis ED Disposition    ED Disposition Condition [facility] Area: [place] [100100]  Level of Care: Telemetry Medical [104]  I expect the patient will be discharged within 24 hours: No (not a candidate for 5C-Observation unit)  Diagnosis: Esophageal stenosis [846962]  Admitting Physician: Lenore Cordia [[id number]]  Attending Physician: Lenore Cordia [[id number]]  PT Class (Do Not Modify): Observation [104]  PT Acc Code (Do Not Modify): Observation [10022]       B Medical/Surgery History Past Medical History:  Diagnosis Date  . Hypertension    Past Surgical History:  Procedure Laterality Date  . BREAST SURGERY     breast biopsy  . CHOLECYSTECTOMY       A IV Location/Drains/Wounds Patient Lines/Drains/Airways Status   Active Line/Drains/Airways    Name:   Placement date:   Placement time:   Site:   Days:   Peripheral IV 08/31/18 Right Antecubital   08/31/18    1620    Antecubital   less than 1          Intake/Output Last 24 hours No intake or output data in the 24 hours ending 08/31/18 1923  Labs/Imaging Results for orders placed or performed during the hospital encounter of 08/31/18 (from the past 48 hour(s))  Comprehensive metabolic panel     Status: Abnormal   Collection Time: 08/31/18  4:19 PM  Result Value Ref Range   Sodium 141 135 - 145 mmol/L   Potassium 2.3 (LL) 3.5 - 5.1 mmol/L   Comment: CRITICAL RESULT CALLED TO, READ BACK BY AND VERIFIED WITH: B [facility] 1716 08/31/2018 D BRADLEY    Chloride 100 98 - 111 mmol/L   CO2 25 22 - 32 mmol/L   Glucose, Bld 114 (H) 70 - 99 mg/dL   BUN 33 (H) 8 - 23 mg/dL   Creatinine, Ser 1.92 (H) 0.44 - 1.00 mg/dL   Calcium 10.6 (H) 8.9 - 10.3 mg/dL   Total Protein 8.1 6.5 - 8.1 g/dL   Albumin 4.5 3.5 - 5.0 g/dL   AST 33 15 - 41 U/L   ALT 15 0 - 44 U/L   Alkaline Phosphatase 59 38 - 126 U/L   Total Bilirubin 1.4 (H) 0.3 - 1.2 mg/dL   GFR calc non Af Amer 24 (L) >60 mL/min   GFR calc Af Amer 28 (L) >60 mL/min   Anion gap 16 (H) 5 - 15    Comment: Performed at [facility], [address], [place], [place] [address]  CBC with Differential     Status: Abnormal   Collection Time: 08/31/18  4:19 PM  Result Value Ref Range   WBC 5.0 4.0 - 10.5 K/uL   RBC 5.28 (H) 3.87 - 5.11 MIL/uL   Hemoglobin 11.9 (L) 12.0 - 15.0 g/dL   HCT 37.4 36.0 - 46.0 %   MCV 70.8 (L) 80.0 - 100.0 fL   MCH 22.5 (L) 26.0 -  34.0 pg   MCHC 31.8 30.0 - 36.0 g/dL   RDW 14.9 11.5 - 15.5 %   Platelets 228 150 - 400 K/uL   nRBC 0.0 0.0 - 0.2 %   Neutrophils Relative % 58 %   Neutro Abs 2.8 1.7 - 7.7 K/uL   Lymphocytes Relative 32 %   Lymphs Abs 1.6 0.7 - 4.0 K/uL   Monocytes Relative 10 %   Monocytes Absolute 0.5 0.1 - 1.0 K/uL   Eosinophils Relative 0 %   Eosinophils Absolute 0.0 0.0 - 0.5 K/uL   Basophils Relative 0 %   Basophils Absolute 0.0 0.0 - 0.1 K/uL   Immature Granulocytes 0 %   Abs Immature Granulocytes 0.01 0.00 - 0.07 K/uL    Comment: Performed at [facility] [address], [place], [place] [address]  Urinalysis, Routine w reflex microscopic     Status: Abnormal   Collection Time: 08/31/18  4:19 PM  Result Value Ref Range   Color, Urine YELLOW YELLOW   APPearance HAZY (A) CLEAR   Specific Gravity, Urine 1.017 1.005 - 1.030   pH 5.0 5.0 - 8.0   Glucose, UA NEGATIVE NEGATIVE mg/dL   Hgb urine dipstick NEGATIVE NEGATIVE    Bilirubin Urine NEGATIVE NEGATIVE   Ketones, ur 20 (A) NEGATIVE mg/dL   Protein, ur 30 (A) NEGATIVE mg/dL   Nitrite NEGATIVE NEGATIVE   Leukocytes,Ua NEGATIVE NEGATIVE   RBC / HPF 0-5 0 - 5 RBC/hpf   WBC, UA 6-10 0 - 5 WBC/hpf   Bacteria, UA FEW (A) NONE SEEN   Squamous Epithelial / LPF 6-10 0 - 5   Mucus PRESENT    Hyaline Casts, UA PRESENT     Comment: Performed at [facility], [address], [place], [place] [address]  Magnesium     Status: None   Collection Time: 08/31/18  4:19 PM  Result Value Ref Range   Magnesium 2.1 1.7 - 2.4 mg/dL    Comment: Performed at [facility], [place] [address] [place],  [address]   Dg Esophagus W Single Cm (sol Or Thin Ba)  Result Date: 08/31/2018 CLINICAL DATA:  Dysphagia. EXAM: ESOPHOGRAM/BARIUM SWALLOW TECHNIQUE: Single contrast examination was performed using thin barium and barium tablet. FLUOROSCOPY TIME:  Fluoroscopy Time:  2 minutes 36 second Radiation Exposure Index (if provided by the fluoroscopic device): Number of Acquired Spot Images: 0 COMPARISON:  None. FINDINGS: Poor esophageal motility especially in the distal esophagus with numerous tertiary contractions. No aspiration. Large epiphrenic diverticulum projecting to the right. This measures approximately 3 cm. Moderately severe stenosis in the esophagus just above the diverticulum. Barium tablet did not pass through this area after several minutes of intermittent fluoroscopy. No mass lesion identified. IMPRESSION: Moderately severe stenosis distal esophagus which did not allow passage of the barium tablet. Just below the stricture there is a 3 cm epiphrenic diverticulum. Poor esophageal motility. Electronically Signed   By: Franchot Gallo M.D.   On: 08/31/2018 17:17    Pending Labs Unresulted Labs (From admission, onward)    Start     Ordered   09/01/18 3570  Basic metabolic panel  Tomorrow morning,   R     08/31/18 1833   09/01/18 0500  CBC  Tomorrow morning,   R      08/31/18 1833          Vitals/Pain Today's Vitals   08/31/18 1800 08/31/18 1815 08/31/18 1830 08/31/18 1845  BP: 134/89 (!) 149/85 Marland Kitchen)  145/68 136/89  Pulse: 94 96 92 87  Resp: 17 15 15 17   Temp:      TempSrc:      SpO2: 98% 98% 97% 97%  Weight:      Height:      PainSc:        Isolation Precautions No active isolations  Medications Medications  potassium chloride 10 mEq in 100 mL IVPB (10 mEq Intravenous New Bag/Given 08/31/18 1922)  heparin injection 5,000 Units (has no administration in time range)  sodium chloride flush (NS) 0.9 % injection 3 mL (has no administration in time range)  0.9 % NaCl with KCl 40 mEq / L  infusion (100 mL/hr Intravenous New Bag/Given 08/31/18 1921)  acetaminophen (TYLENOL) tablet 650 mg (has no administration in time range)    Or  acetaminophen (TYLENOL) suppository 650 mg (has no administration in time range)  potassium chloride 10 mEq in 100 mL IVPB (has no administration in time range)  amLODipine (NORVASC) tablet 5 mg (has no administration in time range)  lactated ringers bolus 1,000 mL (1,000 mLs Intravenous New Bag/Given 08/31/18 1752)  potassium chloride 20 MEQ/15ML (10%) solution 40 mEq (40 mEq Oral Given 08/31/18 1744)    Mobility walks with device Low fall risk   Focused Assessments Cardiac Assessment Handoff:    Lab Results  Component Value Date   TROPONINI <0.30 02/17/2014   No results found for: DDIMER Does the Patient currently have chest pain? No     R Recommendations: See Admitting Provider Note  Report given to:  6N RN  Additional Notes:  Currently on 2nd run of potassium, will get 5 total.

## 2018-08-31 NOTE — ED Notes (Signed)
Patient transported to x-ray. ?

## 2018-08-31 NOTE — ED Triage Notes (Signed)
Pt from home, complaining of inability to eat, weakness, and constipation for approximately 2 weeks.

## 2018-08-31 NOTE — H&P (Signed)
History and Physical    Shannon Ortiz GXQ:[id number] DOB: [birth year] DOA: 08/31/2018  PCP: Shannon Cruel, MD  Patient coming from: Home  I have personally briefly reviewed patient's old medical records in [place]  Chief Complaint: Dysphagia  HPI: Shannon Ortiz is a 83 y.o. female with medical history significant for hypertension presents to the ED with complaints of dysphagia.  Patient reports difficulty with swallowing food and liquids which began a little over 2 weeks ago.  She has had a sensation of food getting stuck in her lower chest without associated odynophagia, nausea, vomiting, abdominal pain, diarrhea, dysuria, cough.  She was seen by her PCP who started her on a PPI and Carafate, however as she was not having much improvement she presented to the ED for further evaluation.  ED Course:  Initial vitals in the ED showed BP 160/76, pulse 103, RR 13, temp 99.2 Fahrenheit, SPO2 99% on room air.  Orthostatic vitals were positive with drop in SBP from lying to standing position.  Labs are notable for potassium 2.3, magnesium 2.1, BUN 33, creatinine 1.92, WBC 5.0, hemoglobin 11.9, platelets 228.  Urinalysis was negative for UTI.  Barium swallow/esophagram was obtained which showed moderately severe stenosis of the distal esophagus, 3 cm epiphrenic diverticulum, and poor esophageal motility.  Patient was given 1 L lactated Ringer's, oral potassium solution 40 mEq, and IV K 10 mEq x 2.  The ED physician discussed the case with on-call GI, Dr. Cristina Gong, who recommended medical admission and GI will see in the morning.  Review of Systems: As per HPI otherwise 10 point review of systems negative.    Past Medical History:  Diagnosis Date  . Hypertension     Past Surgical History:  Procedure Laterality Date  . BREAST SURGERY     breast biopsy  . CHOLECYSTECTOMY       reports that she has never smoked. She does not have any smokeless tobacco history on file. She reports  that she does not drink alcohol or use drugs.  No Known Allergies  Family History  Problem Relation Age of Onset  . Hypertension Mother   . Diabetes Mother        non-insulin dependant  . CAD Brother      Prior to Admission medications   Medication Sig Start Date End Date Taking? Authorizing Provider  amLODipine (NORVASC) 5 MG tablet Take 5 mg by mouth daily.   Yes [provider]  CARAFATE 1 GM/10ML suspension Take 10 mLs by mouth 2 (two) times daily. For 15 days 08/26/18  Yes [provider]  losartan-hydrochlorothiazide (HYZAAR) 100-25 MG per tablet Take 1 tablet by mouth daily.   Yes [provider]  pantoprazole (PROTONIX) 40 MG tablet Take 40 mg by mouth daily.   Yes [provider]    Physical Exam: Vitals:   08/31/18 1815 08/31/18 1830 08/31/18 1845 08/31/18 1930  BP: (!) 149/85 (!) 145/68 136/89 (!) 142/76  Pulse: 96 92 87 85  Resp: 15 15 17 13   Temp:      TempSrc:      SpO2: 98% 97% 97% 97%  Weight:      Height:        Constitutional: Elderly woman resting in bed, NAD, calm, comfortable Eyes: PERRL, lids and conjunctivae normal ENMT: Mucous membranes are moist. Posterior pharynx clear of any exudate or lesions. Dentures in place..  Neck: normal, supple, no masses. Respiratory: clear to auscultation bilaterally, no wheezing, no crackles. Normal respiratory  effort. No accessory muscle use.  Cardiovascular: Regular rate and rhythm, no murmurs / rubs / gallops. No extremity edema. 2+ pedal pulses. Abdomen: no tenderness, no masses palpated. No hepatosplenomegaly. Bowel sounds positive.  Musculoskeletal: no clubbing / cyanosis. No joint deformity upper and lower extremities. Good ROM, no contractures. Normal muscle tone.  Skin: no rashes, lesions, ulcers. No induration Neurologic: CN 2-12 grossly intact. Sensation intact, Strength 5/5 in all 4.  Psychiatric: Normal judgment and insight. Alert and oriented x 3. Normal mood.      Labs on Admission: I have personally reviewed following labs and imaging studies  CBC: Recent Labs  Lab 08/31/18 1619  WBC 5.0  NEUTROABS 2.8  HGB 11.9*  HCT 37.4  MCV 70.8*  PLT 630   Basic Metabolic Panel: Recent Labs  Lab 08/31/18 1619  NA 141  K 2.3*  CL 100  CO2 25  GLUCOSE 114*  BUN 33*  CREATININE 1.92*  CALCIUM 10.6*  MG 2.1   GFR: Estimated Creatinine Clearance: 25 mL/min (A) (by C-G formula based on SCr of 1.92 mg/dL (H)). Liver Function Tests: Recent Labs  Lab 08/31/18 1619  AST 33  ALT 15  ALKPHOS 59  BILITOT 1.4*  PROT 8.1  ALBUMIN 4.5   No results for input(s): LIPASE, AMYLASE in the last 168 hours. No results for input(s): AMMONIA in the last 168 hours. Coagulation Profile: No results for input(s): INR, PROTIME in the last 168 hours. Cardiac Enzymes: No results for input(s): CKTOTAL, CKMB, CKMBINDEX, TROPONINI in the last 168 hours. BNP (last 3 results) No results for input(s): PROBNP in the last 8760 hours. HbA1C: No results for input(s): HGBA1C in the last 72 hours. CBG: No results for input(s): GLUCAP in the last 168 hours. Lipid Profile: No results for input(s): CHOL, HDL, LDLCALC, TRIG, CHOLHDL, LDLDIRECT in the last 72 hours. Thyroid Function Tests: No results for input(s): TSH, T4TOTAL, FREET4, T3FREE, THYROIDAB in the last 72 hours. Anemia Panel: No results for input(s): VITAMINB12, FOLATE, FERRITIN, TIBC, IRON, RETICCTPCT in the last 72 hours. Urine analysis:    Component Value Date/Time   COLORURINE YELLOW 08/31/2018 1619   APPEARANCEUR HAZY (A) 08/31/2018 1619   LABSPEC 1.017 08/31/2018 1619   PHURINE 5.0 08/31/2018 1619   GLUCOSEU NEGATIVE 08/31/2018 1619   HGBUR NEGATIVE 08/31/2018 1619   BILIRUBINUR NEGATIVE 08/31/2018 1619   KETONESUR 20 (A) 08/31/2018 1619   PROTEINUR 30 (A) 08/31/2018 1619   UROBILINOGEN 0.2 02/16/2014 1737   NITRITE NEGATIVE 08/31/2018 1619   LEUKOCYTESUR NEGATIVE 08/31/2018 1619     Radiological Exams on Admission: Dg Esophagus W Single Cm (sol Or Thin Ba)  Result Date: 08/31/2018 CLINICAL DATA:  Dysphagia. EXAM: ESOPHOGRAM/BARIUM SWALLOW TECHNIQUE: Single contrast examination was performed using thin barium and barium tablet. FLUOROSCOPY TIME:  Fluoroscopy Time:  2 minutes 36 second Radiation Exposure Index (if provided by the fluoroscopic device): Number of Acquired Spot Images: 0 COMPARISON:  None. FINDINGS: Poor esophageal motility especially in the distal esophagus with numerous tertiary contractions. No aspiration. Large epiphrenic diverticulum projecting to the right. This measures approximately 3 cm. Moderately severe stenosis in the esophagus just above the diverticulum. Barium tablet did not pass through this area after several minutes of intermittent fluoroscopy. No mass lesion identified. IMPRESSION: Moderately severe stenosis distal esophagus which did not allow passage of the barium tablet. Just below the stricture there is a 3 cm epiphrenic diverticulum. Poor esophageal motility. Electronically Signed   By: Franchot Gallo M.D.   On: 08/31/2018  17:17    EKG: Independently reviewed.  Normal sinus rhythm, LAD, IVCD, PVCs  Assessment/Plan Principal Problem:   Esophageal stenosis Active Problems:   HTN (hypertension)   Hypokalemia   AKI (acute kidney injury) (HCC)  Vultaggio Sedor is a 83 y.o. female with medical history significant for hypertension who was admitted with dysphagia/esophageal stenosis, hypokalemia, and acute kidney injury.  Dysphagia/esophageal stenosis: Moderately severe distal esophageal stenosis and poor esophageal motility noted on barium swallow. -GI consulted, to see in a.m. -Continue IV fluids overnight -Clear liquids for now, n.p.o. at midnight  Hypokalemia: K 2.3 on admission.  Suspect from poor oral intake and HCTZ use. -Given oral and IV repletion in the ED, give additional IV K 10 mEq x3 and recheck in a.m. -Monitor on telemetry   AKI: Likely prerenal from poor oral intake, dehydration. -Continue IV fluids overnight -Hold home losartan-HCTZ -Recheck labs in a.m.  Hypertension: Orthostatic on admission, likely from dehydration and antihypertensive use. -Holding losartan-HCTZ as above -Continue IV fluids overnight, resume amlodipine tomorrow   DVT prophylaxis: subq heparin Code Status: Full code, confirmed with patient Family Communication: No family at bedside admission Disposition Plan: Pending improvement in electrolyte abnormalities, acute kidney injury, and GI evaluation Consults called: GI, Dr. Cristina Gong consulted by EDP, to see in AM  Admission status: Observation   Zada Finders MD [facility] Pager [phone]  If 7PM-7AM, please contact night-coverage [email or web address]  08/31/2018, 8:58 PM

## 2018-08-31 NOTE — ED Provider Notes (Signed)
[facility] EMERGENCY DEPARTMENT Provider Note   CSN: [id number] Arrival date & time: 08/31/18  1538    History   Chief Complaint Chief Complaint  Patient presents with  . Weakness  . Constipation    HPI Shannon Ortiz is a 83 y.o. female.     83 year old female with past medical history including hypertension, cholecystectomy who presents with weakness and difficulty swallowing.  Patient reports progressively worsening problems swallowing mainly solid foods, anything oatmeal consistency or thicker.  She is able to swallow liquids but states that anything else feels like it gets hung up at the bottom of her esophagus.  Daughter notes that she has had a lot of belching.  She denies any associated abdominal pain.  Daughter notes that she has lost approximately 30 pounds in the last month, unintentionally.  No associated nausea, vomiting, or fevers.  She does occasionally cough up thick mucus but denies any shortness of breath, URI symptoms, or chest pain.  No urinary symptoms. She saw her PCP who started her on PPI and carafate 5 days ago with no relief. She had an outpatient abdominal US which was normal. Daughter is concerned that she has had worsening weakness recently.  The history is provided by the patient and a relative.  Weakness  Constipation    Past Medical History:  Diagnosis Date  . Hypertension     Patient Active Problem List   Diagnosis Date Noted  . Esophageal stenosis 08/31/2018  . Hypokalemia 08/31/2018  . AKI (acute kidney injury) ([place]) 08/31/2018  . Bradycardia 02/16/2014  . Near syncope 02/16/2014  . HTN (hypertension) 02/16/2014    Past Surgical History:  Procedure Laterality Date  . BREAST SURGERY     breast biopsy  . CHOLECYSTECTOMY       OB History   No obstetric history on file.      Home Medications    Prior to Admission medications   Medication Sig Start Date End Date Taking? Authorizing Provider  amLODipine  (NORVASC) 5 MG tablet Take 5 mg by mouth daily.   Yes [provider]  CARAFATE 1 GM/10ML suspension Take 10 mLs by mouth 2 (two) times daily. For 15 days 08/26/18  Yes [provider]  losartan-hydrochlorothiazide (HYZAAR) 100-25 MG per tablet Take 1 tablet by mouth daily.   Yes [provider]  pantoprazole (PROTONIX) 40 MG tablet Take 40 mg by mouth daily.   Yes [provider]    Family History Family History  Problem Relation Age of Onset  . Hypertension Mother   . Diabetes Mother        non-insulin dependant  . CAD Brother     Social History Social History   Tobacco Use  . Smoking status: Never Smoker  Substance Use Topics  . Alcohol use: No  . Drug use: No     Allergies   Patient has no known allergies.   Review of Systems Review of Systems  Gastrointestinal: Positive for constipation.  Neurological: Positive for weakness.   All other systems reviewed and are negative except that which was mentioned in HPI   Physical Exam Updated Vital Signs BP (!) 145/68   Pulse 92   Temp 99.2 F (37.3 C) (Oral)   Resp 15   Ht 5\' 7"  (1.702 m)   Wt 77.1 kg   SpO2 97%   BMI 26.63 kg/m   Physical Exam Vitals signs and nursing note reviewed.  Constitutional:      General: She  is not in acute distress.    Appearance: She is well-developed.  HENT:     Head: Normocephalic and atraumatic.     Nose: Nose normal.     Mouth/Throat:     Mouth: Mucous membranes are dry.     Pharynx: Oropharynx is clear.  Eyes:     Conjunctiva/sclera: Conjunctivae normal.  Neck:     Musculoskeletal: Neck supple.  Cardiovascular:     Heart sounds: Normal heart sounds. No murmur.     Comments: Occasional irregular beat, borderline tachycardia Pulmonary:     Effort: Pulmonary effort is normal.     Breath sounds: Normal breath sounds.  Abdominal:     General: Bowel sounds are normal. There is no distension.     Palpations: Abdomen is soft.      Tenderness: There is no abdominal tenderness.  Musculoskeletal:     Right lower leg: No edema.     Left lower leg: No edema.  Skin:    General: Skin is warm and dry.  Neurological:     Mental Status: She is alert and oriented to person, place, and time.     Comments: Fluent speech  Psychiatric:        Judgment: Judgment normal.      ED Treatments / Results  Labs (all labs ordered are listed, but only abnormal results are displayed) Labs Reviewed  COMPREHENSIVE METABOLIC PANEL - Abnormal; Notable for the following components:      Result Value   Potassium 2.3 (*)    Glucose, Bld 114 (*)    BUN 33 (*)    Creatinine, Ser 1.92 (*)    Calcium 10.6 (*)    Total Bilirubin 1.4 (*)    GFR calc non Af Amer 24 (*)    GFR calc Af Amer 28 (*)    Anion gap 16 (*)    All other components within normal limits  CBC WITH DIFFERENTIAL/PLATELET - Abnormal; Notable for the following components:   RBC 5.28 (*)    Hemoglobin 11.9 (*)    MCV 70.8 (*)    MCH 22.5 (*)    All other components within normal limits  URINALYSIS, ROUTINE W REFLEX MICROSCOPIC - Abnormal; Notable for the following components:   APPearance HAZY (*)    Ketones, ur 20 (*)    Protein, ur 30 (*)    Bacteria, UA FEW (*)    All other components within normal limits  MAGNESIUM  BASIC METABOLIC PANEL  CBC    EKG EKG Interpretation  Date/Time:  Wednesday August 31 2018 17:36:31 EDT Ventricular Rate:  88 PR Interval:    QRS Duration: 142 QT Interval:  384 QTC Calculation: 465 R Axis:   -57 Text Interpretation:  Sinus or ectopic atrial rhythm Multiple premature complexes, vent & supraven LVH with IVCD, LAD and secondary repol abnrm Inferior infarct, acute (RCA) Probable RV involvement, suggest recording right precordial leads conduction delay new from previous with multiple PVCs Confirmed by Theotis Burrow [phone]) on 08/31/2018 5:43:12 PM   Radiology Dg Esophagus W Single Cm (sol Or Thin Ba)  Result Date: 08/31/2018  CLINICAL DATA:  Dysphagia. EXAM: ESOPHOGRAM/BARIUM SWALLOW TECHNIQUE: Single contrast examination was performed using thin barium and barium tablet. FLUOROSCOPY TIME:  Fluoroscopy Time:  2 minutes 36 second Radiation Exposure Index (if provided by the fluoroscopic device): Number of Acquired Spot Images: 0 COMPARISON:  None. FINDINGS: Poor esophageal motility especially in the distal esophagus with numerous tertiary contractions. No aspiration. Large epiphrenic diverticulum projecting  to the right. This measures approximately 3 cm. Moderately severe stenosis in the esophagus just above the diverticulum. Barium tablet did not pass through this area after several minutes of intermittent fluoroscopy. No mass lesion identified. IMPRESSION: Moderately severe stenosis distal esophagus which did not allow passage of the barium tablet. Just below the stricture there is a 3 cm epiphrenic diverticulum. Poor esophageal motility. Electronically Signed   By: Franchot Gallo M.D.   On: 08/31/2018 17:17    Procedures Procedures (including critical care time)  Medications Ordered in ED Medications  potassium chloride 10 mEq in 100 mL IVPB (10 mEq Intravenous New Bag/Given 08/31/18 1754)  heparin injection 5,000 Units (has no administration in time range)  sodium chloride flush (NS) 0.9 % injection 3 mL (has no administration in time range)  0.9 % NaCl with KCl 40 mEq / L  infusion (has no administration in time range)  acetaminophen (TYLENOL) tablet 650 mg (has no administration in time range)    Or  acetaminophen (TYLENOL) suppository 650 mg (has no administration in time range)  potassium chloride 10 mEq in 100 mL IVPB (has no administration in time range)  amLODipine (NORVASC) tablet 5 mg (has no administration in time range)  lactated ringers bolus 1,000 mL (1,000 mLs Intravenous New Bag/Given 08/31/18 1752)  potassium chloride 20 MEQ/15ML (10%) solution 40 mEq (40 mEq Oral Given 08/31/18 1744)     Initial  Impression / Assessment and Plan / ED Course  I have reviewed the triage vital signs and the nursing notes.  Pertinent labs & imaging results that were available during my care of the patient were reviewed by me and considered in my medical decision making (see chart for details).       Comfortable on exam, no complaints of pain. HR at 100. DDx includes achalasia, esophageal dysmotility, esophageal stricture, mass.  I reviewed outside abdominal ultrasound which was unrevealing. Labs today show K 2.3, Cr 1.92, BUN 33, AG 16, WBC 5, Hgb 11.9. Gave oral and IV K repletion, EKG shows IVCD which may relate to hypokalemia however no recent tracing for comparison. Gave IVF bolus for AKI likely 2/2 dehydration. Obtained barium swallow which shows moderately severe stenosis of distal esophagus with diverticulum just distal to stricture.  I discussed this finding with GI, Dr. Cristina Gong; they will see pt in consultation. Discussed admission with [facility], Dr. Posey Pronto, and pt admitted for further w/u and treatment.  Final Clinical Impressions(s) / ED Diagnoses   Final diagnoses:  Dysphagia  Dehydration  Hypokalemia    ED Discharge Orders    None       Little, Wenda Overland, MD 08/31/18 [phone]

## 2018-09-01 ENCOUNTER — Observation Stay (HOSPITAL_COMMUNITY): Payer: Medicare Other | Admitting: Certified Registered Nurse Anesthetist

## 2018-09-01 ENCOUNTER — Encounter (HOSPITAL_COMMUNITY)
Admission: EM | Disposition: A | Discharge status: Home / Self Care | Payer: Self-pay | Source: Home / Self Care | Attending: Emergency Medicine

## 2018-09-01 ENCOUNTER — Encounter (HOSPITAL_COMMUNITY): Payer: Self-pay

## 2018-09-01 DIAGNOSIS — K3189 Other diseases of stomach and duodenum: Secondary | ICD-10-CM | POA: Diagnosis not present

## 2018-09-01 DIAGNOSIS — Q396 Congenital diverticulum of esophagus: Secondary | ICD-10-CM | POA: Diagnosis not present

## 2018-09-01 DIAGNOSIS — E860 Dehydration: Secondary | ICD-10-CM | POA: Diagnosis not present

## 2018-09-01 DIAGNOSIS — K222 Esophageal obstruction: Secondary | ICD-10-CM | POA: Diagnosis not present

## 2018-09-01 HISTORY — PX: BIOPSY: SHX5522

## 2018-09-01 HISTORY — PX: BOTOX INJECTION: SHX5754

## 2018-09-01 HISTORY — PX: ESOPHAGOGASTRODUODENOSCOPY (EGD) WITH PROPOFOL: SHX5813

## 2018-09-01 HISTORY — PX: BALLOON DILATION: SHX5330

## 2018-09-01 LAB — CBC
HCT: 29.9 % — ABNORMAL LOW (ref 36.0–46.0)
Hemoglobin: 10 g/dL — ABNORMAL LOW (ref 12.0–15.0)
MCH: 23.5 pg — ABNORMAL LOW (ref 26.0–34.0)
MCHC: 33.4 g/dL (ref 30.0–36.0)
MCV: 70.2 fL — ABNORMAL LOW (ref 80.0–100.0)
Platelets: 208 10*3/uL (ref 150–400)
RBC: 4.26 MIL/uL (ref 3.87–5.11)
RDW: 14.7 % (ref 11.5–15.5)
WBC: 5.1 10*3/uL (ref 4.0–10.5)
nRBC: 0 % (ref 0.0–0.2)

## 2018-09-01 LAB — BASIC METABOLIC PANEL
Anion gap: 10 (ref 5–15)
BUN: 32 mg/dL — AB (ref 8–23)
CO2: 23 mmol/L (ref 22–32)
Calcium: 9.6 mg/dL (ref 8.9–10.3)
Chloride: 106 mmol/L (ref 98–111)
Creatinine, Ser: 1.49 mg/dL — ABNORMAL HIGH (ref 0.44–1.00)
GFR calc Af Amer: 38 mL/min — ABNORMAL LOW (ref >60)
GFR calc non Af Amer: 33 mL/min — ABNORMAL LOW (ref >60)
GLUCOSE: 96 mg/dL (ref 70–99)
POTASSIUM: 3 mmol/L — AB (ref 3.5–5.1)
Sodium: 139 mmol/L (ref 135–145)

## 2018-09-01 SURGERY — ESOPHAGOGASTRODUODENOSCOPY (EGD) WITH PROPOFOL
Anesthesia: Monitor Anesthesia Care

## 2018-09-01 MED ORDER — HYDROCHLOROTHIAZIDE 25 MG PO TABS
25.0000 mg | ORAL_TABLET | Freq: Every day | ORAL | Status: DC
  Administered 2018-09-01 – 2018-09-02 (×2): 25 mg via ORAL
  Filled 2018-09-01 (×2): qty 1

## 2018-09-01 MED ORDER — POTASSIUM CHLORIDE IN NACL 40-0.9 MEQ/L-% IV SOLN
INTRAVENOUS | Status: DC
  Administered 2018-09-01: 100 mL/h via INTRAVENOUS
  Filled 2018-09-01 (×2): qty 1000

## 2018-09-01 MED ORDER — PROPOFOL 500 MG/50ML IV EMUL
INTRAVENOUS | Status: DC | PRN
  Administered 2018-09-01: 125 ug/kg/min via INTRAVENOUS

## 2018-09-01 MED ORDER — ENSURE ENLIVE PO LIQD
237.0000 mL | Freq: Three times a day (TID) | ORAL | Status: DC
  Administered 2018-09-01 – 2018-09-02 (×3): 237 mL via ORAL

## 2018-09-01 MED ORDER — PROPOFOL 10 MG/ML IV BOLUS
INTRAVENOUS | Status: DC | PRN
  Administered 2018-09-01: 20 mg via INTRAVENOUS

## 2018-09-01 MED ORDER — PANTOPRAZOLE SODIUM 40 MG PO TBEC
40.0000 mg | DELAYED_RELEASE_TABLET | Freq: Every day | ORAL | Status: DC
  Administered 2018-09-01 – 2018-09-02 (×2): 40 mg via ORAL
  Filled 2018-09-01 (×2): qty 1

## 2018-09-01 MED ORDER — POTASSIUM CHLORIDE 10 MEQ/100ML IV SOLN
10.0000 meq | INTRAVENOUS | Status: AC
  Administered 2018-09-01 (×6): 10 meq via INTRAVENOUS
  Filled 2018-09-01 (×5): qty 100

## 2018-09-01 MED ORDER — ONDANSETRON HCL 4 MG/2ML IJ SOLN
INTRAMUSCULAR | Status: DC | PRN
  Administered 2018-09-01: 4 mg via INTRAVENOUS

## 2018-09-01 MED ORDER — POTASSIUM CHLORIDE 10 MEQ/100ML IV SOLN
INTRAVENOUS | Status: AC
  Filled 2018-09-01: qty 100

## 2018-09-01 MED ORDER — SODIUM CHLORIDE (PF) 0.9 % IJ SOLN
INTRAMUSCULAR | Status: DC | PRN
  Administered 2018-09-01: 4 mL via SUBMUCOSAL

## 2018-09-01 MED ORDER — SODIUM CHLORIDE 0.9 % IV SOLN: INTRAVENOUS | Status: DC

## 2018-09-01 MED ORDER — ADULT MULTIVITAMIN W/MINERALS CH
1.0000 | ORAL_TABLET | Freq: Every day | ORAL | Status: DC
  Administered 2018-09-01 – 2018-09-02 (×2): 1 via ORAL
  Filled 2018-09-01 (×3): qty 1

## 2018-09-01 MED ORDER — LOSARTAN POTASSIUM 50 MG PO TABS
100.0000 mg | ORAL_TABLET | Freq: Every day | ORAL | Status: DC
  Administered 2018-09-01 – 2018-09-02 (×2): 100 mg via ORAL
  Filled 2018-09-01 (×3): qty 2

## 2018-09-01 MED ORDER — SODIUM CHLORIDE (PF) 0.9 % IJ SOLN
INTRAMUSCULAR | Status: AC
  Filled 2018-09-01: qty 10

## 2018-09-01 MED ORDER — LOSARTAN POTASSIUM-HCTZ 100-25 MG PO TABS: 1.0000 | ORAL_TABLET | Freq: Every day | ORAL | Status: DC

## 2018-09-01 MED ORDER — SUCRALFATE 1 GM/10ML PO SUSP
1.0000 g | Freq: Two times a day (BID) | ORAL | Status: DC
  Administered 2018-09-01 – 2018-09-02 (×3): 1 g via ORAL
  Filled 2018-09-01 (×3): qty 10

## 2018-09-01 MED ORDER — LACTATED RINGERS IV SOLN
INTRAVENOUS | Status: DC | PRN
  Administered 2018-09-01: 10:00:00 via INTRAVENOUS

## 2018-09-01 MED ORDER — SODIUM CHLORIDE (PF) 0.9 % IJ SOLN
100.0000 [IU] | Freq: Once | INTRAMUSCULAR | Status: DC
  Filled 2018-09-01: qty 100

## 2018-09-01 SURGICAL SUPPLY — 15 items

## 2018-09-01 NOTE — Transfer of Care (Signed)
Immediate Anesthesia Transfer of Care Note  Patient: Shannon Ortiz  Procedure(s) Performed: ESOPHAGOGASTRODUODENOSCOPY (EGD) WITH PROPOFOL (N/A )  Patient Location: PACU and Endoscopy Unit  Anesthesia Type:MAC  Level of Consciousness: awake and drowsy  Airway & Oxygen Therapy: Patient Spontanous Breathing  Post-op Assessment: Report given to RN and Post -op Vital signs reviewed and stable  Post vital signs: Reviewed and stable  Last Vitals:  Vitals Value Taken Time  BP    Temp    Pulse    Resp    SpO2      Last Pain:  Vitals:   09/01/18 0936  TempSrc: Oral  PainSc: 0-No pain         Complications: No apparent anesthesia complications

## 2018-09-01 NOTE — Op Note (Signed)
[facility] Patient Name: Shannon Ortiz Procedure Date : 09/01/2018 MRN: [id number] Attending MD: Ronnette Juniper , MD Date of Birth: Aug 23, 1935 CSN: [id number] Age: 83 Admit Type: Inpatient Procedure:                Upper GI endoscopy Indications:              Dysphagia, Abnormal cine-esophagram Providers:                Ronnette Juniper, MD, Baird Cancer, RN, Cherylynn Ridges,                            Technician Referring MD:              Medicines:                Monitored Anesthesia Care Complications:            No immediate complications. Estimated blood loss:                            Minimal. Estimated Blood Loss:     Estimated blood loss was minimal. Procedure:                Pre-Anesthesia Assessment:                           - Prior to the procedure, a History and Physical                            was performed, and patient medications and                            allergies were reviewed. The patient's tolerance of                            previous anesthesia was also reviewed. The risks                            and benefits of the procedure and the sedation                            options and risks were discussed with the patient.                            All questions were answered, and informed consent                            was obtained. Prior Anticoagulants: The patient has                            taken no previous anticoagulant or antiplatelet                            agents. ASA Grade Assessment: III - A patient with                            severe  systemic disease. After reviewing the risks                            and benefits, the patient was deemed in                            satisfactory condition to undergo the procedure.                           After obtaining informed consent, the endoscope was                            passed under direct vision. Throughout the                            procedure, the patient's blood  pressure, pulse, and                            oxygen saturations were monitored continuously. The                            GIF-H190 ([id number]) Olympus gastroscope was                            introduced through the mouth, and advanced to the                            second part of duodenum. The upper GI endoscopy was                            accomplished without difficulty. The patient                            tolerated the procedure well. Scope In: Scope Out: Findings:      A non-bleeding diverticulum with a large opening and no stigmata of       recent bleeding was found in the lower third of the esophagus. There was       solid food noted within the diverticulum above GE junction.      A widely patent Schatzki ring was found at the gastroesophageal       junction. A TTS dilator was passed through the scope. Dilation with an       18-19-20 mm x 5.5 cm CRE balloon dilator was performed to 20 mm. The       dilation site was examined following endoscope reinsertion and showed       moderate mucosal disruption. Estimated blood loss was minimal. Area was       successfully injected with 100 units botulinum toxin.      Diffuse moderately erythematous mucosa without bleeding was found at the       incisura, in the gastric antrum and at the pylorus. Biopsies were taken       with a cold forceps for Helicobacter pylori testing.      The cardia and gastric fundus were normal on retroflexion.      The examined duodenum was normal. Impression:               -  Diverticulum in the lower third of the esophagus.                           - Widely patent Schatzki ring. Dilated. Injected                            with botulinum toxin.                           - Erythematous mucosa in the incisura, antrum and                            pylorus. Biopsied.                           - Normal examined duodenum. Moderate Sedation:      Patient did not receive moderate sedation for this  procedure, but       instead received monitored anesthesia care. Recommendation:           - Mechanical soft diet.                           - Continue present medications.                           - Await pathology results.                           - Patient may have a motility disorder, but                            performing esophageal manometry may be tricky as                            patient has a 3 cm esophageal diverticulum. Procedure Code(s):        --- Professional ---                           [phone], Esophagogastroduodenoscopy, flexible,                            transoral; with transendoscopic balloon dilation of                            esophagus (less than 30 mm diameter)                           43236, 59, Esophagogastroduodenoscopy, flexible,                            transoral; with directed submucosal injection(s),                            any substance                           43239, 59, Esophagogastroduodenoscopy, flexible,  transoral; with biopsy, single or multiple Diagnosis Code(s):        --- Professional ---                           Q39.6, Congenital diverticulum of esophagus                           K22.2, Esophageal obstruction                           K31.89, Other diseases of stomach and duodenum                           R13.10, Dysphagia, unspecified                           R93.3, Abnormal findings on diagnostic imaging of                            other parts of digestive tract CPT copyright 2018 [employer]. All rights reserved. The codes documented in this report are preliminary and upon coder review may  be revised to meet current compliance requirements. Ronnette Juniper, MD 09/01/2018 10:51:52 AM This report has been signed electronically. Number of Addenda: 0

## 2018-09-01 NOTE — H&P (View-Only) (Signed)
[facility] Consult  Referring Provider: Lenore Cordia, MD/[facility] Primary Care Physician:  Lawerance Cruel, MD Primary Gastroenterologist: Sadie Haber primary  Reason for Consultation: Dysphasia, abnormal barium swallow  HPI: Shannon Ortiz is a 83 y.o. female was in his usual state of health until 2 to 3 weeks prior to presentation to ED.  She has developed gradually worsening difficulty swallowing solids more than liquids.  She has been eating mashed potato, mashed soft peaches and oatmeal with milk, and unable to eat bread or other kind of solid food.  She describes difficulty in swallowing and food getting stuck in the lower end of her chest.  She has not had any nausea, vomiting or regurgitation of food.  She has lost almost 23 pounds in the last several months.  She has developed constipation in the last 2 to 3 weeks, with 1 bowel movement every 3 to 4 days, but denies black stools or bloody bowel movements.  She has never had an endoscopy or a colonoscopy in the past.  She denies abdominal or rectal pain.  She denies acid reflux or heartburn, but was given Carafate and pantoprazole for her symptoms, without relief in difficulty swallowing.  She is a non-smoker and denies use of NSAIDs.   Past Medical History:  Diagnosis Date  . Hypertension     Past Surgical History:  Procedure Laterality Date  . BREAST SURGERY     breast biopsy  . CHOLECYSTECTOMY      Prior to Admission medications   Medication Sig Start Date End Date Taking? Authorizing Provider  amLODipine (NORVASC) 5 MG tablet Take 5 mg by mouth daily.   Yes [provider]  CARAFATE 1 GM/10ML suspension Take 10 mLs by mouth 2 (two) times daily. For 15 days 08/26/18  Yes [provider]  losartan-hydrochlorothiazide (HYZAAR) 100-25 MG per tablet Take 1 tablet by mouth daily.   Yes [provider]  pantoprazole (PROTONIX) 40 MG tablet Take 40 mg by mouth daily.   Yes [provider]    Current Facility-Administered Medications  Medication Dose Route Frequency Provider Last Rate Last Dose  . acetaminophen (TYLENOL) tablet 650 mg  650 mg Oral Q6H PRN Lenore Cordia, MD       Or  . acetaminophen (TYLENOL) suppository 650 mg  650 mg Rectal Q6H PRN Zada Finders R, MD      . amLODipine (NORVASC) tablet 5 mg  5 mg Oral Daily Zada Finders R, MD      . heparin injection 5,000 Units  5,000 Units Subcutaneous Q8H Lenore Cordia, MD   5,000 Units at 09/01/18 0527  . potassium chloride 10 mEq in 100 mL IVPB  10 mEq Intravenous Q1 Hr x 6 Hall, Carole N, DO      . sodium chloride flush (NS) 0.9 % injection 3 mL  3 mL Intravenous Q12H Lenore Cordia, MD        Allergies as of 08/31/2018  . (No Known Allergies)    Family History  Problem Relation Age of Onset  . Hypertension Mother   . Diabetes Mother        non-insulin dependant  . CAD Brother     Social History   Socioeconomic History  . Marital status: Widowed    Spouse name: Not on file  . Number of children: Not on file  . Years of education: Not on file  . Highest education level: Not on file  Occupational History  . Not  on file  Social Needs  . Financial resource strain: Not on file  . Food insecurity:    Worry: Not on file    Inability: Not on file  . Transportation needs:    Medical: Not on file    Non-medical: Not on file  Tobacco Use  . Smoking status: Never Smoker  Substance and Sexual Activity  . Alcohol use: No  . Drug use: No  . Sexual activity: Never  Lifestyle  . Physical activity:    Days per week: Not on file    Minutes per session: Not on file  . Stress: Not on file  Relationships  . Social connections:    Talks on phone: Not on file    Gets together: Not on file    Attends religious service: Not on file    Active member of club or organization: Not on file    Attends meetings of clubs or organizations: Not on file    Relationship status: Not on file  .  Intimate partner violence:    Fear of current or ex partner: Not on file    Emotionally abused: Not on file    Physically abused: Not on file    Forced sexual activity: Not on file  Other Topics Concern  . Not on file  Social History Narrative  . Not on file    Review of Systems: Positive for: GI: Described in detail in HPI.    Gen: involuntary weight loss, Denies any fever, chills, rigors, night sweats, anorexia, fatigue, weakness, malaise, and sleep disorder CV: Denies chest pain, angina, palpitations, syncope, orthopnea, PND, peripheral edema, and claudication. Resp: Denies dyspnea, cough, sputum, wheezing, coughing up blood. GU : Denies urinary burning, blood in urine, urinary frequency, urinary hesitancy, nocturnal urination, and urinary incontinence. MS: Denies joint pain or swelling.  Denies muscle weakness, cramps, atrophy.  Derm: Denies rash, itching, oral ulcerations, hives, unhealing ulcers.  Psych: Denies depression, anxiety, memory loss, suicidal ideation, hallucinations,  and confusion. Heme: Denies bruising, bleeding, and enlarged lymph nodes. Neuro:  Denies any headaches, dizziness, paresthesias. Endo:  Denies any problems with DM, thyroid, adrenal function.  Physical Exam: Vital signs in last 24 hours: Temp:  [98 F (36.7 C)-99.2 F (37.3 C)] 98 F (36.7 C) (03/19 0447) Pulse Rate:  [72-100] 72 (03/19 0447) Resp:  [13-17] 14 (03/19 0447) BP: (132-162)/(68-89) 135/70 (03/19 0447) SpO2:  [97 %-99 %] 99 % (03/19 0447) Weight:  [77.1 kg] 77.1 kg (03/18 1555) Last BM Date: 08/24/18  General:   Alert,  Well-developed, overweight, pleasant and cooperative in NAD Head:  Normocephalic and atraumatic. Eyes:  Sclera clear, no icterus.   Conjunctiva pink. Ears:  Normal auditory acuity. Nose:  No deformity, discharge,  or lesions. Mouth:  No deformity or lesions.  Oropharynx pink & moist. Neck:  Supple; no masses or thyromegaly. Lungs:  Clear throughout to  auscultation.   No wheezes, crackles, or rhonchi. No acute distress. Heart:  Regular rate and rhythm; no murmurs, clicks, rubs,  or gallops. Extremities:  Without clubbing or edema. Neurologic:  Alert and  oriented x4;  grossly normal neurologically. Skin:  Intact without significant lesions or rashes. Psych:  Alert and cooperative. Normal mood and affect. Abdomen:  Soft, nontender and nondistended. No masses, hepatosplenomegaly or hernias noted. Normal bowel sounds, without guarding, and without rebound.         Lab Results: Recent Labs    08/31/18 1619 09/01/18 0213  WBC 5.0 5.1  HGB 11.9* 10.0*  HCT 37.4 29.9*  PLT 228 208   BMET Recent Labs    08/31/18 1619 09/01/18 0213  NA 141 139  K 2.3* 3.0*  CL 100 106  CO2 25 23  GLUCOSE 114* 96  BUN 33* 32*  CREATININE 1.92* 1.49*  CALCIUM 10.6* 9.6   LFT Recent Labs    08/31/18 1619  PROT 8.1  ALBUMIN 4.5  AST 33  ALT 15  ALKPHOS 59  BILITOT 1.4*   PT/INR No results for input(s): LABPROT, INR in the last 72 hours.  Studies/Results: Dg Esophagus W Single Cm (sol Or Thin Ba)  Result Date: 08/31/2018 CLINICAL DATA:  Dysphagia. EXAM: ESOPHOGRAM/BARIUM SWALLOW TECHNIQUE: Single contrast examination was performed using thin barium and barium tablet. FLUOROSCOPY TIME:  Fluoroscopy Time:  2 minutes 36 second Radiation Exposure Index (if provided by the fluoroscopic device): Number of Acquired Spot Images: 0 COMPARISON:  None. FINDINGS: Poor esophageal motility especially in the distal esophagus with numerous tertiary contractions. No aspiration. Large epiphrenic diverticulum projecting to the right. This measures approximately 3 cm. Moderately severe stenosis in the esophagus just above the diverticulum. Barium tablet did not pass through this area after several minutes of intermittent fluoroscopy. No mass lesion identified. IMPRESSION: Moderately severe stenosis distal esophagus which did not allow passage of the barium  tablet. Just below the stricture there is a 3 cm epiphrenic diverticulum. Poor esophageal motility. Electronically Signed   By: Franchot Gallo M.D.   On: 08/31/2018 17:17    Impression: Dysphasia, solids more than liquids, progressively worsening, associated with unintentional weight loss Barium swallow showed moderately severe stenosis in distal esophagus 3 cm epiphrenic diverticulum noted Poor esophageal motility  Hypokalemia Microcytic anemia, hemoglobin 11.9 on presentation, MCV 70.8 Renal impairment, BUN 33/creatinine 1.92/GFR 28 on presentation   Plan: Plan diagnostic EGD today. Possible balloon dilatation/Botox injection The risks and the benefits of the procedure were discussed with the patient in details. She verbalizes understanding and consents. Patient to receive potassium chloride 10 M EQ IV x6 starting at 8 AM   LOS: 0 days   Ronnette Juniper, MD 09/01/2018, 7:42 AM  Pager [phone] If no answer or after 5 PM call [phone]

## 2018-09-01 NOTE — Consult Note (Signed)
[facility] Consult  Referring Provider: Lenore Cordia, MD/[facility] Primary Care Physician:  Lawerance Cruel, MD Primary Gastroenterologist: Sadie Haber primary  Reason for Consultation: Dysphasia, abnormal barium swallow  HPI: Shannon Ortiz is a 83 y.o. female was in his usual state of health until 2 to 3 weeks prior to presentation to ED.  She has developed gradually worsening difficulty swallowing solids more than liquids.  She has been eating mashed potato, mashed soft peaches and oatmeal with milk, and unable to eat bread or other kind of solid food.  She describes difficulty in swallowing and food getting stuck in the lower end of her chest.  She has not had any nausea, vomiting or regurgitation of food.  She has lost almost 23 pounds in the last several months.  She has developed constipation in the last 2 to 3 weeks, with 1 bowel movement every 3 to 4 days, but denies black stools or bloody bowel movements.  She has never had an endoscopy or a colonoscopy in the past.  She denies abdominal or rectal pain.  She denies acid reflux or heartburn, but was given Carafate and pantoprazole for her symptoms, without relief in difficulty swallowing.  She is a non-smoker and denies use of NSAIDs.   Past Medical History:  Diagnosis Date  . Hypertension     Past Surgical History:  Procedure Laterality Date  . BREAST SURGERY     breast biopsy  . CHOLECYSTECTOMY      Prior to Admission medications   Medication Sig Start Date End Date Taking? Authorizing Provider  amLODipine (NORVASC) 5 MG tablet Take 5 mg by mouth daily.   Yes [provider]  CARAFATE 1 GM/10ML suspension Take 10 mLs by mouth 2 (two) times daily. For 15 days 08/26/18  Yes [provider]  losartan-hydrochlorothiazide (HYZAAR) 100-25 MG per tablet Take 1 tablet by mouth daily.   Yes [provider]  pantoprazole (PROTONIX) 40 MG tablet Take 40 mg by mouth daily.   Yes [provider]    Current Facility-Administered Medications  Medication Dose Route Frequency Provider Last Rate Last Dose  . acetaminophen (TYLENOL) tablet 650 mg  650 mg Oral Q6H PRN Lenore Cordia, MD       Or  . acetaminophen (TYLENOL) suppository 650 mg  650 mg Rectal Q6H PRN Zada Finders R, MD      . amLODipine (NORVASC) tablet 5 mg  5 mg Oral Daily Zada Finders R, MD      . heparin injection 5,000 Units  5,000 Units Subcutaneous Q8H Lenore Cordia, MD   5,000 Units at 09/01/18 0527  . potassium chloride 10 mEq in 100 mL IVPB  10 mEq Intravenous Q1 Hr x 6 Hall, Carole N, DO      . sodium chloride flush (NS) 0.9 % injection 3 mL  3 mL Intravenous Q12H Lenore Cordia, MD        Allergies as of 08/31/2018  . (No Known Allergies)    Family History  Problem Relation Age of Onset  . Hypertension Mother   . Diabetes Mother        non-insulin dependant  . CAD Brother     Social History   Socioeconomic History  . Marital status: Widowed    Spouse name: Not on file  . Number of children: Not on file  . Years of education: Not on file  . Highest education level: Not on file  Occupational History  . Not  on file  Social Needs  . Financial resource strain: Not on file  . Food insecurity:    Worry: Not on file    Inability: Not on file  . Transportation needs:    Medical: Not on file    Non-medical: Not on file  Tobacco Use  . Smoking status: Never Smoker  Substance and Sexual Activity  . Alcohol use: No  . Drug use: No  . Sexual activity: Never  Lifestyle  . Physical activity:    Days per week: Not on file    Minutes per session: Not on file  . Stress: Not on file  Relationships  . Social connections:    Talks on phone: Not on file    Gets together: Not on file    Attends religious service: Not on file    Active member of club or organization: Not on file    Attends meetings of clubs or organizations: Not on file    Relationship status: Not on file  .  Intimate partner violence:    Fear of current or ex partner: Not on file    Emotionally abused: Not on file    Physically abused: Not on file    Forced sexual activity: Not on file  Other Topics Concern  . Not on file  Social History Narrative  . Not on file    Review of Systems: Positive for: GI: Described in detail in HPI.    Gen: involuntary weight loss, Denies any fever, chills, rigors, night sweats, anorexia, fatigue, weakness, malaise, and sleep disorder CV: Denies chest pain, angina, palpitations, syncope, orthopnea, PND, peripheral edema, and claudication. Resp: Denies dyspnea, cough, sputum, wheezing, coughing up blood. GU : Denies urinary burning, blood in urine, urinary frequency, urinary hesitancy, nocturnal urination, and urinary incontinence. MS: Denies joint pain or swelling.  Denies muscle weakness, cramps, atrophy.  Derm: Denies rash, itching, oral ulcerations, hives, unhealing ulcers.  Psych: Denies depression, anxiety, memory loss, suicidal ideation, hallucinations,  and confusion. Heme: Denies bruising, bleeding, and enlarged lymph nodes. Neuro:  Denies any headaches, dizziness, paresthesias. Endo:  Denies any problems with DM, thyroid, adrenal function.  Physical Exam: Vital signs in last 24 hours: Temp:  [98 F (36.7 C)-99.2 F (37.3 C)] 98 F (36.7 C) (03/19 0447) Pulse Rate:  [72-100] 72 (03/19 0447) Resp:  [13-17] 14 (03/19 0447) BP: (132-162)/(68-89) 135/70 (03/19 0447) SpO2:  [97 %-99 %] 99 % (03/19 0447) Weight:  [77.1 kg] 77.1 kg (03/18 1555) Last BM Date: 08/24/18  General:   Alert,  Well-developed, overweight, pleasant and cooperative in NAD Head:  Normocephalic and atraumatic. Eyes:  Sclera clear, no icterus.   Conjunctiva pink. Ears:  Normal auditory acuity. Nose:  No deformity, discharge,  or lesions. Mouth:  No deformity or lesions.  Oropharynx pink & moist. Neck:  Supple; no masses or thyromegaly. Lungs:  Clear throughout to  auscultation.   No wheezes, crackles, or rhonchi. No acute distress. Heart:  Regular rate and rhythm; no murmurs, clicks, rubs,  or gallops. Extremities:  Without clubbing or edema. Neurologic:  Alert and  oriented x4;  grossly normal neurologically. Skin:  Intact without significant lesions or rashes. Psych:  Alert and cooperative. Normal mood and affect. Abdomen:  Soft, nontender and nondistended. No masses, hepatosplenomegaly or hernias noted. Normal bowel sounds, without guarding, and without rebound.         Lab Results: Recent Labs    08/31/18 1619 09/01/18 0213  WBC 5.0 5.1  HGB 11.9* 10.0*  HCT 37.4 29.9*  PLT 228 208   BMET Recent Labs    08/31/18 1619 09/01/18 0213  NA 141 139  K 2.3* 3.0*  CL 100 106  CO2 25 23  GLUCOSE 114* 96  BUN 33* 32*  CREATININE 1.92* 1.49*  CALCIUM 10.6* 9.6   LFT Recent Labs    08/31/18 1619  PROT 8.1  ALBUMIN 4.5  AST 33  ALT 15  ALKPHOS 59  BILITOT 1.4*   PT/INR No results for input(s): LABPROT, INR in the last 72 hours.  Studies/Results: Dg Esophagus W Single Cm (sol Or Thin Ba)  Result Date: 08/31/2018 CLINICAL DATA:  Dysphagia. EXAM: ESOPHOGRAM/BARIUM SWALLOW TECHNIQUE: Single contrast examination was performed using thin barium and barium tablet. FLUOROSCOPY TIME:  Fluoroscopy Time:  2 minutes 36 second Radiation Exposure Index (if provided by the fluoroscopic device): Number of Acquired Spot Images: 0 COMPARISON:  None. FINDINGS: Poor esophageal motility especially in the distal esophagus with numerous tertiary contractions. No aspiration. Large epiphrenic diverticulum projecting to the right. This measures approximately 3 cm. Moderately severe stenosis in the esophagus just above the diverticulum. Barium tablet did not pass through this area after several minutes of intermittent fluoroscopy. No mass lesion identified. IMPRESSION: Moderately severe stenosis distal esophagus which did not allow passage of the barium  tablet. Just below the stricture there is a 3 cm epiphrenic diverticulum. Poor esophageal motility. Electronically Signed   By: Franchot Gallo M.D.   On: 08/31/2018 17:17    Impression: Dysphasia, solids more than liquids, progressively worsening, associated with unintentional weight loss Barium swallow showed moderately severe stenosis in distal esophagus 3 cm epiphrenic diverticulum noted Poor esophageal motility  Hypokalemia Microcytic anemia, hemoglobin 11.9 on presentation, MCV 70.8 Renal impairment, BUN 33/creatinine 1.92/GFR 28 on presentation   Plan: Plan diagnostic EGD today. Possible balloon dilatation/Botox injection The risks and the benefits of the procedure were discussed with the patient in details. She verbalizes understanding and consents. Patient to receive potassium chloride 10 M EQ IV x6 starting at 8 AM   LOS: 0 days   Ronnette Juniper, MD 09/01/2018, 7:42 AM  Pager [phone] If no answer or after 5 PM call [phone]

## 2018-09-01 NOTE — Care Management Obs Status (Signed)
[facility] NOTIFICATION   Patient Details  Name: Shannon Ortiz MRN: [id number] Date of Birth: 1935/08/07   Medicare Observation Status Notification Given:  Yes    Marilu Favre, RN 09/01/2018, 1:00 PM

## 2018-09-01 NOTE — Brief Op Note (Signed)
08/31/2018 - 09/01/2018  10:51 AM  PATIENT:  Darrol Jump  83 y.o. female  PRE-OPERATIVE DIAGNOSIS:  Dysphagia, abnormal barium swallow  POST-OPERATIVE DIAGNOSIS:  * No post-op diagnosis entered *  PROCEDURE:  Procedure(s): ESOPHAGOGASTRODUODENOSCOPY (EGD) WITH PROPOFOL (N/A)  SURGEON:  Surgeon(s) and Role:    Ronnette Juniper, MD - Primary  PHYSICIAN ASSISTANT:   ASSISTANTS: Tempie Hoist, RN, Janie Billups, Tech   ANESTHESIA:   MAC  EBL:  0 mL   BLOOD ADMINISTERED:none  DRAINS: none   LOCAL MEDICATIONS USED:  NONE  SPECIMEN:  Biopsy / Limited Resection  DISPOSITION OF SPECIMEN:  PATHOLOGY  COUNTS:  YES  TOURNIQUET:  * No tourniquets in log *  DICTATION: .Dragon Dictation  PLAN OF CARE: Admit to inpatient   PATIENT DISPOSITION:  PACU - hemodynamically stable.   Delay start of Pharmacological VTE agent (>24hrs) due to surgical blood loss or risk of bleeding: no

## 2018-09-01 NOTE — Progress Notes (Signed)
PROGRESS NOTE  Shannon Ortiz [id number] DOB: Jul 24, 1935 DOA: 08/31/2018 PCP: Shannon Cruel, MD  HPI/Recap of past 24 hours:  Shannon Ortiz is a 83 y.o. female with medical history significant for hypertension presents to the ED with complaints of dysphagia.  Patient reports difficulty with swallowing food and liquids which began a little over 2 weeks ago.  She has had a sensation of food getting stuck in her lower chest without associated odynophagia, nausea, vomiting, abdominal pain, diarrhea, dysuria, cough.  She was seen by her PCP who started her on a PPI and Carafate, however as she was not having much improvement she presented to the ED for further evaluation.  Barium swallow/esophagram was obtained which showed moderately severe stenosis of the distal esophagus, 3 cm epiphrenic diverticulum, and poor esophageal motility.  09/01/18: Patient seen and examined at bedside.  No acute events overnight.  She has no new complaints.  GI following.  Highly appreciated.  EGD done today revealed 3 cm esophageal diverticulum.  Recommendations for mechanical soft diet.   Assessment/Plan: Principal Problem:   Esophageal stenosis Active Problems:   HTN (hypertension)   Hypokalemia   AKI (acute kidney injury) ([place])  Dysphasia/esophageal stenosis secondary to 3 cm esophageal diverticulum GI following.  Highly appreciated.  EGD done on 09/01/2018 revealed 3 cm esophageal diverticulum, widely patent Schatzki ring which was dilated and injected with botulinum toxin Erythematous mucosa in the insula, antrum and pylorus, biopsied. Recommendations for mechanical soft diet at this time Continue aspiration precautions  Severe hypokalemia, unclear etiology Presented with potassium 2.3 Potassium on 09/01/2018 is 3.0 Added 10 mEq of IV potassium x6 doses Repeat BMP in the morning Magnesium normal 2.1  Chronic normocytic anemia Hemoglobin at baseline appears to be 12 Drop of hemoglobin from  11.9-10 No obvious sign of bleeding Repeat CBC in the morning Vital signs are stable  AKI on CKD 3 Baseline creatinine appears to be 1.4 with GFR of 38 Presented with creatinine of 1.92 She is back to her baseline creatinine Avoid nephrotoxic agents/dehydration/hypotension Repeat BMP in the morning Monitor urine output  Hypertension Blood pressure is soft Maintain map greater than 65 Hold amlodipine due to bradycardia Hold losartan and HCTZ due to AKI  Sinus bradycardia Hold amlodipine    DVT prophylaxis: subq heparin Code Status: Full code, confirmed with patient Family Communication:  None at bedside Disposition Plan:  Pending improvement of electrolyte abnormalities.  Possible discharge tomorrow or when GI signs off. Consults called: GI   Admission status: Observation      Objective: Vitals:   09/01/18 1047 09/01/18 1055 09/01/18 1105 09/01/18 1110  BP: (!) 113/48 123/60 (!) 114/54   Pulse: 64 (!) 59 (!) 47 (!) 45  Resp: 17 15  13   Temp: 98.8 F (37.1 C)     TempSrc: Oral     SpO2: 97%     Weight:      Height:        Intake/Output Summary (Last 24 hours) at 09/01/2018 1113 Last data filed at 09/01/2018 1039 Gross per 24 hour  Intake 1162.89 ml  Output 0 ml  Net 1162.89 ml   Filed Weights   08/31/18 1555  Weight: 77.1 kg    Exam:  . General: 83 y.o. year-old female well developed well nourished in no acute distress.  Alert and oriented x3. . Cardiovascular: Regular rate and rhythm with no rubs or gallops.  No thyromegaly or JVD noted.   Marland Kitchen Respiratory: Clear to auscultation with no wheezes  or rales. Good inspiratory effort. . Abdomen: Soft nontender nondistended with normal bowel sounds x4 quadrants. . Musculoskeletal: No lower extremity edema. 2/4 pulses in all 4 extremities. Marland Kitchen Psychiatry: Mood is appropriate for condition and setting   Data Reviewed: CBC: Recent Labs  Lab 08/31/18 1619 09/01/18 0213  WBC 5.0 5.1  NEUTROABS 2.8  --    HGB 11.9* 10.0*  HCT 37.4 29.9*  MCV 70.8* 70.2*  PLT 228 914   Basic Metabolic Panel: Recent Labs  Lab 08/31/18 1619 09/01/18 0213  NA 141 139  K 2.3* 3.0*  CL 100 106  CO2 25 23  GLUCOSE 114* 96  BUN 33* 32*  CREATININE 1.92* 1.49*  CALCIUM 10.6* 9.6  MG 2.1  --    GFR: Estimated Creatinine Clearance: 32.2 mL/min (A) (by C-G formula based on SCr of 1.49 mg/dL (H)). Liver Function Tests: Recent Labs  Lab 08/31/18 1619  AST 33  ALT 15  ALKPHOS 59  BILITOT 1.4*  PROT 8.1  ALBUMIN 4.5   No results for input(s): LIPASE, AMYLASE in the last 168 hours. No results for input(s): AMMONIA in the last 168 hours. Coagulation Profile: No results for input(s): INR, PROTIME in the last 168 hours. Cardiac Enzymes: No results for input(s): CKTOTAL, CKMB, CKMBINDEX, TROPONINI in the last 168 hours. BNP (last 3 results) No results for input(s): PROBNP in the last 8760 hours. HbA1C: No results for input(s): HGBA1C in the last 72 hours. CBG: No results for input(s): GLUCAP in the last 168 hours. Lipid Profile: No results for input(s): CHOL, HDL, LDLCALC, TRIG, CHOLHDL, LDLDIRECT in the last 72 hours. Thyroid Function Tests: No results for input(s): TSH, T4TOTAL, FREET4, T3FREE, THYROIDAB in the last 72 hours. Anemia Panel: No results for input(s): VITAMINB12, FOLATE, FERRITIN, TIBC, IRON, RETICCTPCT in the last 72 hours. Urine analysis:    Component Value Date/Time   COLORURINE YELLOW 08/31/2018 1619   APPEARANCEUR HAZY (A) 08/31/2018 1619   LABSPEC 1.017 08/31/2018 1619   PHURINE 5.0 08/31/2018 1619   GLUCOSEU NEGATIVE 08/31/2018 1619   HGBUR NEGATIVE 08/31/2018 1619   BILIRUBINUR NEGATIVE 08/31/2018 1619   KETONESUR 20 (A) 08/31/2018 1619   PROTEINUR 30 (A) 08/31/2018 1619   UROBILINOGEN 0.2 02/16/2014 1737   NITRITE NEGATIVE 08/31/2018 1619   LEUKOCYTESUR NEGATIVE 08/31/2018 1619   Sepsis Labs: @LABRCNTIP (procalcitonin:4,lacticidven:4)  )No results found for this  or any previous visit (from the past 240 hour(s)).    Studies: Dg Esophagus W Single Cm (sol Or Thin Ba)  Result Date: 08/31/2018 CLINICAL DATA:  Dysphagia. EXAM: ESOPHOGRAM/BARIUM SWALLOW TECHNIQUE: Single contrast examination was performed using thin barium and barium tablet. FLUOROSCOPY TIME:  Fluoroscopy Time:  2 minutes 36 second Radiation Exposure Index (if provided by the fluoroscopic device): Number of Acquired Spot Images: 0 COMPARISON:  None. FINDINGS: Poor esophageal motility especially in the distal esophagus with numerous tertiary contractions. No aspiration. Large epiphrenic diverticulum projecting to the right. This measures approximately 3 cm. Moderately severe stenosis in the esophagus just above the diverticulum. Barium tablet did not pass through this area after several minutes of intermittent fluoroscopy. No mass lesion identified. IMPRESSION: Moderately severe stenosis distal esophagus which did not allow passage of the barium tablet. Just below the stricture there is a 3 cm epiphrenic diverticulum. Poor esophageal motility. Electronically Signed   By: Franchot Gallo M.D.   On: 08/31/2018 17:17    Scheduled Meds: . [MAR Hold] amLODipine  5 mg Oral Daily  . [MAR Hold] botox 100 units/NS 4  mL for final conc. 25 units/mL mixture  100 Units Submucosal Once  . [MAR Hold] heparin  5,000 Units Subcutaneous Q8H  . [MAR Hold] sodium chloride flush  3 mL Intravenous Q12H    Continuous Infusions: . sodium chloride    . [MAR Hold] potassium chloride 10 mEq (09/01/18 0901)     LOS: 0 days     Kayleen Memos, MD [facility] Pager [phone]  If 7PM-7AM, please contact night-coverage [email or web address] Password [email or web address] 09/01/2018, 11:13 AM

## 2018-09-01 NOTE — Discharge Instructions (Addendum)
Dehydration  Dehydration is when there is not enough fluid or water in your body. This happens when you lose more fluids than you take in. People who are age 81 or older have a higher risk of getting dehydrated. Dehydration can range from mild to very bad. It should be treated right away to keep it from getting very bad. Symptoms of mild dehydration may include:  Thirst.  Dry lips.  Slightly dry mouth.  Dry, warm skin.  Dizziness. Symptoms of moderate dehydration may include:  Very dry mouth.  Muscle cramps.  Dark pee (urine). Pee may be the color of tea.  Your body making less pee.  Your eyes making fewer tears.  Heartbeat that is uneven or faster than normal (palpitations).  Headache.  Light-headedness, especially when you stand up from sitting.  Fainting (syncope). Symptoms of very bad dehydration may include:  Changes in skin, such as: ? Cold and clammy skin. ? Blotchy (mottled) or pale skin. ? Skin that does not quickly return to normal after being lightly pinched and let go (poor skin turgor).  Changes in body fluids, such as: ? Feeling very thirsty. ? Your eyes making fewer tears. ? Not sweating when body temperature is high, such as in hot weather. ? Your body making very little pee.  Changes in vital signs, such as: ? Weak pulse. ? Pulse that is more than 100 beats a minute when you are sitting still. ? Fast breathing. ? Low blood pressure.  Other changes, such as: ? Sunken eyes. ? Cold hands and feet. ? Confusion. ? Lack of energy (lethargy). ? Trouble waking up from sleep. ? Short-term weight loss. ? Unconsciousness. Follow these instructions at home:   If told by your doctor, drink an ORS: ? Make an ORS by using instructions on the package. ? Start by drinking small amounts, about  cup (120 mL) every 5-10 minutes. ? Slowly drink more until you have had the amount that your doctor said to have.  Drink enough clear fluid to keep your  pee clear or pale yellow. If you were told to drink an ORS, finish the ORS first, then start slowly drinking clear fluids. Drink fluids such as: ? Water. Do not drink only water by itself. Doing that can make the salt (sodium) level in your body get too low (hyponatremia). ? Ice chips. ? Fruit juice that you have added water to (diluted). ? Low-calorie sports drinks.  Avoid: ? Alcohol. ? Drinks that have a lot of sugar. These include high-calorie sports drinks, fruit juice that does not have water added, and soda. ? Caffeine. ? Foods that are greasy or have a lot of fat or sugar.  Take over-the-counter and prescription medicines only as told by your doctor.  Do not take salt tablets. Doing that can make the salt level in your body get too high (hypernatremia).  Eat foods that have minerals (electrolytes). Examples include bananas, oranges, potatoes, tomatoes, and spinach.  Keep all follow-up visits as told by your doctor. This is important. Contact a doctor if:  You have belly (abdominal) pain that: ? Gets worse. ? Stays in one area (localizes).  You have a rash.  You have a stiff neck.  You get angry or annoyed more easily than normal (irritability).  You are more sleepy than normal.  You have a harder time waking up than normal.  You feel: ? Weak. ? Dizzy. ? Very thirsty. Get help right away if:  You have symptoms of  very bad dehydration.  You cannot drink fluids without throwing up (vomiting).  Your symptoms get worse with treatment.  You have a fever.  You have a very bad headache.  You are throwing up or having watery poop (diarrhea) and it: ? Gets worse. ? Does not go away.  You have diarrhea for more than 24 hours.  You have blood or something green (bile) in your throw-up.  You have blood in your poop (stool). This may cause poop to look black and tarry.  You have not peed in 6-8 hours.  You have peed (urinated) only a small amount of very dark  pee during 6-8 hours.  You pass out (faint).  Your heart rate when you are sitting still is more than 100 beats a minute.  You have trouble breathing. This information is not intended to replace advice given to you by your health care provider. Make sure you discuss any questions you have with your health care provider. Document Released: 05/21/2011 Document Revised: 12/20/2015 Document Reviewed: 07/26/2015 Elsevier Interactive Patient Education  2019 [facility] Stay Proper nutrition can help your body recover from illness and injury.   Foods and beverages high in protein, vitamins, and minerals help rebuild muscle loss, promote healing, & reduce fall risk.   In addition to eating healthy foods, a nutrition shake is an easy, delicious way to get the nutrition you need during and after your hospital stay  It is recommended that you continue to drink 2 bottles per day of:       Ensure Plus for at least 1 month (30 days) after your hospital stay   Tips for adding a nutrition shake into your routine: As allowed, drink one with vitamins or medications instead of water or juice Enjoy one as a tasty mid-morning or afternoon snack Drink cold or make a milkshake out of it Drink one instead of milk with cereal or snacks Use as a coffee creamer   Available at the following grocery stores and pharmacies:           * [place] [phone]            For COUPONS visit: www.ensure.com/join or [email or web address]-may.[email or web address]   Suggested Substitutions Ensure Plus = Boost Plus = Carnation Breakfast Essentials = Boost Compact Ensure Active Clear = Boost Breeze Glucerna Shake = Boost Glucose Control = Carnation Breakfast Essentials SUGAR FREE

## 2018-09-01 NOTE — Anesthesia Preprocedure Evaluation (Signed)
Anesthesia Evaluation    Reviewed: Allergy & Precautions, Patient's Chart, lab work & pertinent test results  Airway Mallampati: II  TM Distance: >3 FB Neck ROM: Full    Dental no notable dental hx. (+) Teeth Intact   Pulmonary neg pulmonary ROS,    Pulmonary exam normal breath sounds clear to auscultation       Cardiovascular hypertension, Pt. on medications Normal cardiovascular exam Rhythm:Regular Rate:Normal     Neuro/Psych negative neurological ROS  negative psych ROS   GI/Hepatic Neg liver ROS, Dysphagia   Endo/Other    Renal/GU Renal disease     Musculoskeletal negative musculoskeletal ROS (+)   Abdominal   Peds  Hematology Hgb 10.0   Anesthesia Other Findings   Reproductive/Obstetrics                             Anesthesia Physical Anesthesia Plan  ASA: II  Anesthesia Plan: MAC   Post-op Pain Management:    Induction: Intravenous  PONV Risk Score and Plan: Treatment may vary due to age or medical condition  Airway Management Planned: Nasal Cannula and Natural Airway  Additional Equipment:   Intra-op Plan:   Post-operative Plan:   Informed Consent: I have reviewed the patients History and Physical, chart, labs and discussed the procedure including the risks, benefits and alternatives for the proposed anesthesia with the patient or authorized representative who has indicated his/her understanding and acceptance.     Dental advisory given  Plan Discussed with: CRNA  Anesthesia Plan Comments:         Anesthesia Quick Evaluation

## 2018-09-01 NOTE — Interval H&P Note (Signed)
History and Physical Interval Note: 82/female with dysphagia and abnormal barium swallow for EGD with possible balloon dilation and botox injection.  09/01/2018 10:11 AM  Shannon Ortiz  has presented today for surgery, with the diagnosis of Dysphagia, abnormal barium swallow.  The various methods of treatment have been discussed with the patient and family. After consideration of risks, benefits and other options for treatment, the patient has consented to  Procedure(s): ESOPHAGOGASTRODUODENOSCOPY (EGD) WITH PROPOFOL (N/A) as a surgical intervention.  The patient's history has been reviewed, patient examined, no change in status, stable for surgery.  I have reviewed the patient's chart and labs.  Questions were answered to the patient's satisfaction.     Shannon Ortiz

## 2018-09-01 NOTE — Anesthesia Postprocedure Evaluation (Signed)
Anesthesia Post Note  Patient: Cherrell Maybee  Procedure(s) Performed: ESOPHAGOGASTRODUODENOSCOPY (EGD) WITH PROPOFOL (N/A )     Patient location during evaluation: Endoscopy Anesthesia Type: MAC Level of consciousness: awake and alert Pain management: pain level controlled Vital Signs Assessment: post-procedure vital signs reviewed and stable Respiratory status: spontaneous breathing, nonlabored ventilation, respiratory function stable and patient connected to nasal cannula oxygen Cardiovascular status: blood pressure returned to baseline and stable Postop Assessment: no apparent nausea or vomiting Anesthetic complications: no    Last Vitals:  Vitals:   09/01/18 0908 09/01/18 0936  BP: (!) 97/59 (!) 168/87  Pulse: 81 93  Resp:  14  Temp:  36.7 C  SpO2:  (!) 15%    Last Pain:  Vitals:   09/01/18 0936  TempSrc: Oral  PainSc: 0-No pain                 Barnet Glasgow

## 2018-09-01 NOTE — Progress Notes (Signed)
Initial Nutrition Assessment  DOCUMENTATION CODES:   Not applicable  INTERVENTION:   -Once diet is advanced,  -Ensure Enlive po TID, each supplement provides 350 kcal and 20 grams of protein -MVI with minerals daily  NUTRITION DIAGNOSIS:   Inadequate oral intake related to dysphagia as evidenced by per patient/family report.  GOAL:   Patient will meet greater than or equal to 90% of their needs  MONITOR:   PO intake, Supplement acceptance, Diet advancement, Labs, Weight trends, Skin, I & O's  REASON FOR ASSESSMENT:   Malnutrition Screening Tool    ASSESSMENT:   Shannon Ortiz is a 83 y.o. female with medical history significant for hypertension who was admitted with dysphagia/esophageal stenosis, hypokalemia, and acute kidney injury.  Pt admitted with dysphagia and esophageal stenosis.   3/19- s/p EGD- revealed  iverticulum in the lower third of the esophagus; widely patent Schatzki ring. Dilated. Injected with botulinum toxin; erythematous mucosa in the incisura, antrum and pylorus. Biopsied; normal examined duodenum.  Reviewed GI notes; recommended start on mechanical soft diet, however, awaiting until pt is more alert from procedure prior to initiating PO intake. GI suspects possible motility disorder. Plan to awaiting biopsies.   Spoke with pt and daughter at bedside, who both confirm a general decline in health over the past month secondary to decreased mobility, weight loss, and decreased oral intake due to dysphagia. Daughter reports pt has experienced progressive decline in PO intake, due to difficulty swallowing- pt would feels sensation of food getting stuck or "coming right back off". Initially, pt was able to tolerate softer texture foods, such as mashed potatoes, however, pt was consuming mainly liquids over the past 2 weeks. Over the past week, pt consumed only chicken soup, water, and Ensure, however, it would take her all day to consume a bowl of soup, which she  would not even finish.   Pt daughter reports UBW is 210#, which she last weighed around one month ago. She estimates pt has experienced a 30# wt loss during this time, however, no wt records available to confirm this. Daughter shares pt has been weaker and more fatigued; has stopped doing activities that require a lot of physical energy, such as washing clothes, one of her favorite pastimes.   Discussed recommendation of mechanical soft diet per GI. Also discussed importance of good meal and supplement intake to promote healing. Both amendable to Ensure supplement to promote healing and additional nourishment.   Labs reviewed: K: 3.0.   NUTRITION - FOCUSED PHYSICAL EXAM:    Most Recent Value  Orbital Region  No depletion  Upper Arm Region  No depletion  Thoracic and Lumbar Region  No depletion  Buccal Region  No depletion  Temple Region  Mild depletion  Clavicle Bone Region  No depletion  Clavicle and Acromion Bone Region  No depletion  Scapular Bone Region  No depletion  Dorsal Hand  No depletion  Patellar Region  No depletion  Anterior Thigh Region  No depletion  Posterior Calf Region  No depletion  Edema (RD Assessment)  Mild  Hair  Reviewed  Eyes  Reviewed  Mouth  Reviewed  Skin  Reviewed  Nails  Reviewed       Diet Order:   Diet Order            DIET - DYS 1 Room service appropriate? Yes; Fluid consistency: Thin  Diet effective now              EDUCATION NEEDS:  Education needs have been addressed  Skin:  Skin Assessment: Reviewed RN Assessment  Last BM:  08/24/18  Height:   Ht Readings from Last 1 Encounters:  08/31/18 5\' 7"  (1.702 m)    Weight:   Wt Readings from Last 1 Encounters:  08/31/18 77.1 kg    Ideal Body Weight:  61.4 kg  BMI:  Body mass index is 26.63 kg/m.  Estimated Nutritional Needs:   Kcal:  1650-1850  Protein:  80-95 grams  Fluid:  1.6-1.8 L    Shannon Ortiz A. Jimmye Norman, RD, LDN, [place] Registered Dietitian II Certified  Diabetes Care and Education Specialist Pager: [phone] After hours Pager: [phone]

## 2018-09-02 ENCOUNTER — Encounter (HOSPITAL_COMMUNITY): Payer: Self-pay | Admitting: *Deleted

## 2018-09-02 DIAGNOSIS — K222 Esophageal obstruction: Secondary | ICD-10-CM | POA: Diagnosis not present

## 2018-09-02 LAB — BASIC METABOLIC PANEL
Anion gap: 6 (ref 5–15)
BUN: 20 mg/dL (ref 8–23)
CO2: 25 mmol/L (ref 22–32)
Calcium: 9.5 mg/dL (ref 8.9–10.3)
Chloride: 109 mmol/L (ref 98–111)
Creatinine, Ser: 1.18 mg/dL — ABNORMAL HIGH (ref 0.44–1.00)
GFR calc Af Amer: 50 mL/min — ABNORMAL LOW (ref >60)
GFR, EST NON AFRICAN AMERICAN: 44 mL/min — AB (ref >60)
Glucose, Bld: 86 mg/dL (ref 70–99)
Potassium: 3.9 mmol/L (ref 3.5–5.1)
Sodium: 140 mmol/L (ref 135–145)

## 2018-09-02 LAB — CBC
HCT: 29.5 % — ABNORMAL LOW (ref 36.0–46.0)
HEMOGLOBIN: 9.1 g/dL — AB (ref 12.0–15.0)
MCH: 22.4 pg — ABNORMAL LOW (ref 26.0–34.0)
MCHC: 30.8 g/dL (ref 30.0–36.0)
MCV: 72.7 fL — ABNORMAL LOW (ref 80.0–100.0)
Platelets: 183 10*3/uL (ref 150–400)
RBC: 4.06 MIL/uL (ref 3.87–5.11)
RDW: 15.8 % — ABNORMAL HIGH (ref 11.5–15.5)
WBC: 4.2 10*3/uL (ref 4.0–10.5)
nRBC: 0 % (ref 0.0–0.2)

## 2018-09-02 MED ORDER — BISACODYL 10 MG RE SUPP: 10.0000 mg | Freq: Once | RECTAL | 0 refills | Status: AC

## 2018-09-02 MED ORDER — ADULT MULTIVITAMIN W/MINERALS CH: 1.0000 | ORAL_TABLET | Freq: Every day | ORAL | 0 refills | Status: DC

## 2018-09-02 MED ORDER — AMLODIPINE BESYLATE 5 MG PO TABS
5.0000 mg | ORAL_TABLET | Freq: Every day | ORAL | Status: DC
  Administered 2018-09-02: 5 mg via ORAL
  Filled 2018-09-02: qty 1

## 2018-09-02 MED ORDER — POLYETHYLENE GLYCOL 3350 17 G PO PACK: 17.0000 g | PACK | Freq: Every day | ORAL | 0 refills | Status: DC

## 2018-09-02 MED ORDER — SENNOSIDES-DOCUSATE SODIUM 8.6-50 MG PO TABS
2.0000 | ORAL_TABLET | Freq: Two times a day (BID) | ORAL | Status: DC
  Administered 2018-09-02: 2 via ORAL
  Filled 2018-09-02: qty 2

## 2018-09-02 MED ORDER — BISACODYL 10 MG RE SUPP
10.0000 mg | Freq: Once | RECTAL | Status: AC
  Administered 2018-09-02: 10 mg via RECTAL
  Filled 2018-09-02: qty 1

## 2018-09-02 MED ORDER — SENNOSIDES-DOCUSATE SODIUM 8.6-50 MG PO TABS: 2.0000 | ORAL_TABLET | Freq: Every evening | ORAL | 0 refills | Status: DC | PRN

## 2018-09-02 MED ORDER — POLYETHYLENE GLYCOL 3350 17 G PO PACK
17.0000 g | PACK | Freq: Every day | ORAL | Status: DC
  Administered 2018-09-02: 17 g via ORAL
  Filled 2018-09-02: qty 1

## 2018-09-02 MED ORDER — ENSURE ENLIVE PO LIQD: 237.0000 mL | Freq: Three times a day (TID) | ORAL | 0 refills | Status: DC

## 2018-09-02 NOTE — Discharge Summary (Addendum)
Discharge Summary  Shannon Ortiz OAC:[id number] DOB: 07-17-1935  PCP: Lawerance Cruel, MD  Admit date: 08/31/2018 Discharge date: 09/02/2018  Time spent: 35 minutes   Recommendations for Outpatient Follow-up:  1. Follow-up with GI 2. Follow-up with your PCP 3. Take your medications as prescribed  Discharge Diagnoses:  Active Hospital Problems   Diagnosis Date Noted   Esophageal stenosis 08/31/2018   Hypokalemia 08/31/2018   AKI (acute kidney injury) ([place]) 08/31/2018   HTN (hypertension) 02/16/2014    Resolved Hospital Problems  No resolved problems to display.    Discharge Condition: Stable   Diet recommendation: Soft diet as recommended by GI  Vitals:   09/01/18 2021 09/02/18 0454  BP: 122/63 (!) 165/87  Pulse: 66 82  Resp: 16 16  Temp: 98.5 F (36.9 C) 98.7 F (37.1 C)  SpO2: 98% 100%    History of present illness:   Shannon Ortiz a 83 y.o.femalewith medical history significant forhypertension presents to the ED with complaints of dysphagia. Patient reports difficulty with swallowing food and liquids which began a little over 2 weeks ago. She has had asensation of food getting stuck in her lower chest without associated odynophagia, nausea, vomiting, abdominal pain, diarrhea, dysuria, cough. She was seen by her PCP who started her on a PPI and Carafate, however as she was not having much improvement she presented to the ED for further evaluation.  Barium swallow/esophagram was obtained which showed moderately severe stenosis of the distal esophagus, 3 cm epiphrenic diverticulum, and poor esophageal motility.  EGD done on 09/01/18 revealed 3 cm esophageal diverticulum.  Recommendations for mechanical soft diet.  09/02/18: Patient seen and examined with her daughter at bedside.  No acute events overnight.  She has no complaints.  Vital signs and labs reviewed and are stable.  On the day of discharge, the patient was hemodynamically stable.  She will  need to follow-up with her primary care provider and GI posthospitalization.   Hospital Course:  Principal Problem:   Esophageal stenosis Active Problems:   HTN (hypertension)   Hypokalemia   AKI (acute kidney injury) ([place])  Dysphagia/esophageal stenosis secondary to 3 cm esophageal diverticulum and Schatzki ring GI following.  Highly appreciated.  EGD done on 09/01/2018 revealed 3 cm esophageal diverticulum, widely patent Schatzki ring which was dilated and injected with botulinum toxin Erythematous mucosa in the insula, antrum and pylorus, biopsied. Biopsy pending Recommendations for mechanical soft diet at this time Continue aspiration precautions Follow-up with GI outpatient  Resolved severe hypokalemia, unclear etiology Presented with potassium 2.3 Potassium on 09/02/2018 is 3.9 Magnesium normal 2.1  Chronic normocytic anemia Hemoglobin at baseline appears to be 12 Hg 9.1 on 09/02/18 No obvious sign of bleeding Vital signs are stable Follow-up with your PCP  Resolved AKI on CKD 3 Baseline creatinine appears to be 1.1 with GFR 50 Creatinine 1.18 on 09/02/2018 from 1.49 yesterday  Presented with creatinine of 1.92 She is back to her baseline creatinine F/u w PCP  Hypertension Resume antihypertensive medications Follow up with your PCP  Resolved sinus bradycardia    Code Status:Full code, confirmed with patient  Consults called:GI    Discharge Exam: BP (!) 165/87 (BP Location: Left Arm)    Pulse 82    Temp 98.7 F (37.1 C) (Oral)    Resp 16    Ht 5\' 7"  (1.702 m)    Wt 77.1 kg    SpO2 100%    BMI 26.63 kg/m   General: 83 y.o. year-old female well developed  well nourished in no acute distress.  Alert and oriented x3.  Cardiovascular: Regular rate and rhythm with no rubs or gallops.  No thyromegaly or JVD noted.    Respiratory: Clear to auscultation with no wheezes or rales. Good inspiratory effort.  Abdomen: Soft nontender nondistended with normal  bowel sounds x4 quadrants.  Musculoskeletal: No lower extremity edema. 2/4 pulses in all 4 extremities.  Psychiatry: Mood is appropriate for condition and setting  Discharge Instructions You were cared for by a hospitalist during your hospital stay. If you have any questions about your discharge medications or the care you received while you were in the hospital after you are discharged, you can call the unit and asked to speak with the hospitalist on call if the hospitalist that took care of you is not available. Once you are discharged, your primary care physician will handle any further medical issues. Please note that NO REFILLS for any discharge medications will be authorized once you are discharged, as it is imperative that you return to your primary care physician (or establish a relationship with a primary care physician if you do not have one) for your aftercare needs so that they can reassess your need for medications and monitor your lab values.   Allergies as of 09/02/2018   No Known Allergies     Medication List    TAKE these medications   amLODipine 5 MG tablet Commonly known as:  NORVASC Take 5 mg by mouth daily.   bisacodyl 10 MG suppository Commonly known as:  DULCOLAX Place 1 suppository (10 mg total) rectally once for 1 dose.   Carafate 1 GM/10ML suspension Generic drug:  sucralfate Take 10 mLs by mouth 2 (two) times daily. For 15 days   feeding supplement (ENSURE ENLIVE) Liqd Take 237 mLs by mouth 3 (three) times daily between meals.   losartan-hydrochlorothiazide 100-25 MG tablet Commonly known as:  HYZAAR Take 1 tablet by mouth daily.   multivitamin with minerals Tabs tablet Take 1 tablet by mouth daily.   pantoprazole 40 MG tablet Commonly known as:  PROTONIX Take 40 mg by mouth daily.   polyethylene glycol packet Commonly known as:  MIRALAX / GLYCOLAX Take 17 g by mouth daily.   senna-docusate 8.6-50 MG tablet Commonly known as:  Senokot-S Take  2 tablets by mouth at bedtime as needed for mild constipation.      No Known Allergies Follow-up Information    Lawerance Cruel, MD. Call in 1 day(s).   Specialty:  Family Medicine Why:  Please call for a post hospital follow-up appointment Contact information: [address] [place] [address] [phone]        Ronnette Juniper, MD. Call in 1 day(s).   Specialty:  Gastroenterology Why:  Please call for a post hospital follow-up appointment Contact information: [place] [address] [phone]            The results of significant diagnostics from this hospitalization (including imaging, microbiology, ancillary and laboratory) are listed below for reference.    Significant Diagnostic Studies: US Abdomen Complete  Result Date: 08/22/2018 CLINICAL DATA:  83 year old female with loss of appetite for the past 2-3 weeks. 8 pound weight loss of 5 months. Prior cholecystectomy. Initial encounter. EXAM: ABDOMEN ULTRASOUND COMPLETE COMPARISON:  No comparison exam available for direct review. Report from prior ultrasound 08/24/2001. FINDINGS: Gallbladder: Post cholecystectomy Common bile duct: Diameter: 5.8 mm Liver: No focal lesion identified. Within normal limits in parenchymal echogenicity.  Portal vein is patent on color Doppler imaging with normal direction of blood flow towards the liver. IVC: No abnormality visualized. Pancreas: Suboptimally evaluated secondary to habitus and bowel gas. Portions visualized unremarkable. Spleen: Size and appearance within normal limits. Right Kidney: Length: 10.6 cm. Echogenicity within normal limits. No mass or hydronephrosis visualized. Left Kidney: Length: 10.6 cm. Echogenicity within normal limits. Upper pole 1.7 cm minimally complex cyst without worrisome features. No hydronephrosis. Abdominal aorta: Atherosclerotic changes. Portions of the aorta not visualized secondary to bowel gas. Visualized portions with  maximal transverse dimension 2.1 cm without focal aneurysm detected. Other findings: None. IMPRESSION: 1. Post cholecystectomy. 2. Aorta poorly delineated secondary to bowel gas. Portions visualized with atherosclerotic changes without focal aneurysm noted. 3. Suboptimal evaluation of the pancreas secondary to bowel gas. The portions which are visualized are unremarkable. 4. 1.7 cm left upper pole minimally complex renal cyst without worrisome features. 5. Remainder of exam unremarkable. Electronically Signed   By: Genia Del M.D.   On: 08/22/2018 07:41   Xr Hip Unilat W Or W/o Pelvis 1v Left  Result Date: 08/17/2018 AP pelvis lateral view left hip: No acute fractures.  Hips with significant joint space narrowing with cystic changes bilateral femoral heads.  Left femoral head appears slightly flattened compared to the right.  Dg Esophagus W Single Cm (sol Or Thin Ba)  Result Date: 08/31/2018 CLINICAL DATA:  Dysphagia. EXAM: ESOPHOGRAM/BARIUM SWALLOW TECHNIQUE: Single contrast examination was performed using thin barium and barium tablet. FLUOROSCOPY TIME:  Fluoroscopy Time:  2 minutes 36 second Radiation Exposure Index (if provided by the fluoroscopic device): Number of Acquired Spot Images: 0 COMPARISON:  None. FINDINGS: Poor esophageal motility especially in the distal esophagus with numerous tertiary contractions. No aspiration. Large epiphrenic diverticulum projecting to the right. This measures approximately 3 cm. Moderately severe stenosis in the esophagus just above the diverticulum. Barium tablet did not pass through this area after several minutes of intermittent fluoroscopy. No mass lesion identified. IMPRESSION: Moderately severe stenosis distal esophagus which did not allow passage of the barium tablet. Just below the stricture there is a 3 cm epiphrenic diverticulum. Poor esophageal motility. Electronically Signed   By: Franchot Gallo M.D.   On: 08/31/2018 17:17    Microbiology: No results  found for this or any previous visit (from the past 240 hour(s)).   Labs: Basic Metabolic Panel: Recent Labs  Lab 08/31/18 1619 09/01/18 0213 09/02/18 0220  NA 141 139 140  K 2.3* 3.0* 3.9  CL 100 106 109  CO2 25 23 25   GLUCOSE 114* 96 86  BUN 33* 32* 20  CREATININE 1.92* 1.49* 1.18*  CALCIUM 10.6* 9.6 9.5  MG 2.1  --   --    Liver Function Tests: Recent Labs  Lab 08/31/18 1619  AST 33  ALT 15  ALKPHOS 59  BILITOT 1.4*  PROT 8.1  ALBUMIN 4.5   No results for input(s): LIPASE, AMYLASE in the last 168 hours. No results for input(s): AMMONIA in the last 168 hours. CBC: Recent Labs  Lab 08/31/18 1619 09/01/18 0213 09/02/18 0220  WBC 5.0 5.1 4.2  NEUTROABS 2.8  --   --   HGB 11.9* 10.0* 9.1*  HCT 37.4 29.9* 29.5*  MCV 70.8* 70.2* 72.7*  PLT 228 208 183   Cardiac Enzymes: No results for input(s): CKTOTAL, CKMB, CKMBINDEX, TROPONINI in the last 168 hours. BNP: BNP (last 3 results) No results for input(s): BNP in the last 8760 hours.  ProBNP (last 3 results)  No results for input(s): PROBNP in the last 8760 hours.  CBG: No results for input(s): GLUCAP in the last 168 hours.     Signed:  Kayleen Memos, MD [facility] 09/02/2018, 8:42 AM

## 2018-09-02 NOTE — Progress Notes (Signed)
Pt for discharge going home, discontinued peripheral IV line, given health teachings, next appointment, given all her personal belongings, due med given explained and understood, no complain of pain at this time, her daughter at the bedside.

## 2018-09-02 NOTE — Progress Notes (Signed)
Subjective: The patient was seen and examined at bedside.  Tolerating mechanical soft diet. c/o constipation.  Objective: Vital signs in last 24 hours: Temp:  [98 F (36.7 C)-98.8 F (37.1 C)] 98.7 F (37.1 C) (03/20 0454) Pulse Rate:  [45-93] 82 (03/20 0454) Resp:  [13-17] 16 (03/20 0454) BP: (97-168)/(48-87) 165/87 (03/20 0454) SpO2:  [97 %-100 %] 100 % (03/20 0454) Weight change:  Last BM Date: 08/24/18  PE: Not in distress GENERAL: Mild pallor, no icterus ABDOMEN: non distended EXTREMITIES: no deformity  Lab Results: Results for orders placed or performed during the hospital encounter of 08/31/18 (from the past 48 hour(s))  Comprehensive metabolic panel     Status: Abnormal   Collection Time: 08/31/18  4:19 PM  Result Value Ref Range   Sodium 141 135 - 145 mmol/L   Potassium 2.3 (LL) 3.5 - 5.1 mmol/L    Comment: CRITICAL RESULT CALLED TO, READ BACK BY AND VERIFIED WITH: [facility] 1716 08/31/2018 D BRADLEY    Chloride 100 98 - 111 mmol/L   CO2 25 22 - 32 mmol/L   Glucose, Bld 114 (H) 70 - 99 mg/dL   BUN 33 (H) 8 - 23 mg/dL   Creatinine, Ser 1.92 (H) 0.44 - 1.00 mg/dL   Calcium 10.6 (H) 8.9 - 10.3 mg/dL   Total Protein 8.1 6.5 - 8.1 g/dL   Albumin 4.5 3.5 - 5.0 g/dL   AST 33 15 - 41 U/L   ALT 15 0 - 44 U/L   Alkaline Phosphatase 59 38 - 126 U/L   Total Bilirubin 1.4 (H) 0.3 - 1.2 mg/dL   GFR calc non Af Amer 24 (L) >60 mL/min   GFR calc Af Amer 28 (L) >60 mL/min   Anion gap 16 (H) 5 - 15    Comment: Performed at [facility], [address], [place], [place] [address]  CBC with Differential     Status: Abnormal   Collection Time: 08/31/18  4:19 PM  Result Value Ref Range   WBC 5.0 4.0 - 10.5 K/uL   RBC 5.28 (H) 3.87 - 5.11 MIL/uL   Hemoglobin 11.9 (L) 12.0 - 15.0 g/dL   HCT 37.4 36.0 - 46.0 %   MCV 70.8 (L) 80.0 - 100.0 fL   MCH 22.5 (L) 26.0 - 34.0 pg   MCHC 31.8 30.0 - 36.0 g/dL   RDW 14.9 11.5 - 15.5 %   Platelets 228 150 - 400 K/uL   nRBC 0.0  0.0 - 0.2 %   Neutrophils Relative % 58 %   Neutro Abs 2.8 1.7 - 7.7 K/uL   Lymphocytes Relative 32 %   Lymphs Abs 1.6 0.7 - 4.0 K/uL   Monocytes Relative 10 %   Monocytes Absolute 0.5 0.1 - 1.0 K/uL   Eosinophils Relative 0 %   Eosinophils Absolute 0.0 0.0 - 0.5 K/uL   Basophils Relative 0 %   Basophils Absolute 0.0 0.0 - 0.1 K/uL   Immature Granulocytes 0 %   Abs Immature Granulocytes 0.01 0.00 - 0.07 K/uL    Comment: Performed at [facility] [address], [place], [place] [address]  Urinalysis, Routine w reflex microscopic     Status: Abnormal   Collection Time: 08/31/18  4:19 PM  Result Value Ref Range   Color, Urine YELLOW YELLOW   APPearance HAZY (A) CLEAR   Specific Gravity, Urine 1.017 1.005 - 1.030   pH 5.0 5.0 - 8.0   Glucose, UA NEGATIVE NEGATIVE mg/dL  Hgb urine dipstick NEGATIVE NEGATIVE   Bilirubin Urine NEGATIVE NEGATIVE   Ketones, ur 20 (A) NEGATIVE mg/dL   Protein, ur 30 (A) NEGATIVE mg/dL   Nitrite NEGATIVE NEGATIVE   Leukocytes,Ua NEGATIVE NEGATIVE   RBC / HPF 0-5 0 - 5 RBC/hpf   WBC, UA 6-10 0 - 5 WBC/hpf   Bacteria, UA FEW (A) NONE SEEN   Squamous Epithelial / LPF 6-10 0 - 5   Mucus PRESENT    Hyaline Casts, UA PRESENT     Comment: Performed at [facility], [place] [address], [place], [place] [address]  Magnesium     Status: None   Collection Time: 08/31/18  4:19 PM  Result Value Ref Range   Magnesium 2.1 1.7 - 2.4 mg/dL    Comment: Performed at [facility], [place] [address] [place], [place] [address]  Basic metabolic panel     Status: Abnormal   Collection Time: 09/01/18  2:13 AM  Result Value Ref Range   Sodium 139 135 - 145 mmol/L   Potassium 3.0 (L) 3.5 - 5.1 mmol/L    Comment: DELTA CHECK NOTED   Chloride 106 98 - 111 mmol/L   CO2 23 22 - 32 mmol/L   Glucose, Bld 96 70 - 99 mg/dL   BUN 32 (H) 8 - 23 mg/dL   Creatinine, Ser 1.49 (H) 0.44 - 1.00 mg/dL   Calcium 9.6 8.9 - 10.3 mg/dL   GFR calc non Af Amer 33 (L) >60  mL/min   GFR calc Af Amer 38 (L) >60 mL/min   Anion gap 10 5 - 15    Comment: Performed at [facility], [place] [address], [place], [place] [address]  CBC     Status: Abnormal   Collection Time: 09/01/18  2:13 AM  Result Value Ref Range   WBC 5.1 4.0 - 10.5 K/uL   RBC 4.26 3.87 - 5.11 MIL/uL   Hemoglobin 10.0 (L) 12.0 - 15.0 g/dL   HCT 29.9 (L) 36.0 - 46.0 %   MCV 70.2 (L) 80.0 - 100.0 fL   MCH 23.5 (L) 26.0 - 34.0 pg   MCHC 33.4 30.0 - 36.0 g/dL   RDW 14.7 11.5 - 15.5 %   Platelets 208 150 - 400 K/uL   nRBC 0.0 0.0 - 0.2 %    Comment: Performed at [facility], [place] [address] [place], [place] [address]  CBC     Status: Abnormal   Collection Time: 09/02/18  2:20 AM  Result Value Ref Range   WBC 4.2 4.0 - 10.5 K/uL   RBC 4.06 3.87 - 5.11 MIL/uL   Hemoglobin 9.1 (L) 12.0 - 15.0 g/dL   HCT 29.5 (L) 36.0 - 46.0 %   MCV 72.7 (L) 80.0 - 100.0 fL   MCH 22.4 (L) 26.0 - 34.0 pg   MCHC 30.8 30.0 - 36.0 g/dL   RDW 15.8 (H) 11.5 - 15.5 %   Platelets 183 150 - 400 K/uL   nRBC 0.0 0.0 - 0.2 %    Comment: Performed at [facility], [place] [address], [place], [place] [address]  Basic metabolic panel     Status: Abnormal   Collection Time: 09/02/18  2:20 AM  Result Value Ref Range   Sodium 140 135 - 145 mmol/L   Potassium 3.9 3.5 - 5.1 mmol/L    Comment: NO VISIBLE HEMOLYSIS   Chloride 109 98 - 111 mmol/L   CO2 25 22 -  32 mmol/L   Glucose, Bld 86 70 - 99 mg/dL   BUN 20 8 - 23 mg/dL   Creatinine, Ser 1.18 (H) 0.44 - 1.00 mg/dL   Calcium 9.5 8.9 - 10.3 mg/dL   GFR calc non Af Amer 44 (L) >60 mL/min   GFR calc Af Amer 50 (L) >60 mL/min   Anion gap 6 5 - 15    Comment: Performed at [facility] [address] [place], [place] [address]    Studies/Results: Dg Esophagus W Single Cm (sol Or Thin Ba)  Result Date: 08/31/2018 CLINICAL DATA:  Dysphagia. EXAM: ESOPHOGRAM/BARIUM SWALLOW TECHNIQUE: Single contrast examination was performed using thin barium and barium  tablet. FLUOROSCOPY TIME:  Fluoroscopy Time:  2 minutes 36 second Radiation Exposure Index (if provided by the fluoroscopic device): Number of Acquired Spot Images: 0 COMPARISON:  None. FINDINGS: Poor esophageal motility especially in the distal esophagus with numerous tertiary contractions. No aspiration. Large epiphrenic diverticulum projecting to the right. This measures approximately 3 cm. Moderately severe stenosis in the esophagus just above the diverticulum. Barium tablet did not pass through this area after several minutes of intermittent fluoroscopy. No mass lesion identified. IMPRESSION: Moderately severe stenosis distal esophagus which did not allow passage of the barium tablet. Just below the stricture there is a 3 cm epiphrenic diverticulum. Poor esophageal motility. Electronically Signed   By: Franchot Gallo M.D.   On: 08/31/2018 17:17    Medications: I have reviewed the patient's current medications.  Assessment: 3 cm esophageal diverticulum above GE junction, Schatzki's ring, 20 mm balloon dilatation and 100 units of Botox injection during EGD yesterday  Plan: Discussed with patient that she may benefit from surgical evaluation for removal of esophageal diverticulum if she continues to be symptomatic, despite above maneuvers performed yesterday. Advised to continue mechanical soft diet and take PPI once a day. Advised to discontinue Carafate, start bulk forming laxative such as Benefiber 1 tablespoon in 8 ounces of water twice a day for constipation. If patient has improvement in symptoms of dysphagia, may need periodic EGD with Botox injection every few months depending on her symptoms. Discussed the same with the patient and her daughter at bedside. They verbalized understanding and consent. Patient to be discharged today and follow-up with me in 6 to 8 weeks.   Ronnette Juniper 09/02/2018, 8:37 AM   Pager [phone] If no answer or after 5 PM call [phone]

## 2018-09-02 NOTE — Plan of Care (Signed)
?  Problem: Education: ?Goal: Knowledge of General Education information will improve ?Description: Including pain rating scale, medication(s)/side effects and non-pharmacologic comfort measures ?Outcome: Progressing ?  ?Problem: Health Behavior/Discharge Planning: ?Goal: Ability to manage health-related needs will improve ?Outcome: Progressing ?  ?Problem: Clinical Measurements: ?Goal: Ability to maintain clinical measurements within normal limits will improve ?Outcome: Progressing ?Goal: Will remain free from infection ?Outcome: Progressing ?Goal: Diagnostic test results will improve ?Outcome: Progressing ?Goal: Cardiovascular complication will be avoided ?Outcome: Progressing ?  ?Problem: Activity: ?Goal: Risk for activity intolerance will decrease ?Outcome: Progressing ?  ?Problem: Nutrition: ?Goal: Adequate nutrition will be maintained ?Outcome: Progressing ?  ?Problem: Coping: ?Goal: Level of anxiety will decrease ?Outcome: Progressing ?  ?Problem: Pain Managment: ?Goal: General experience of comfort will improve ?Outcome: Progressing ?  ?Problem: Safety: ?Goal: Ability to remain free from injury will improve ?Outcome: Progressing ?  ?

## 2018-10-03 ENCOUNTER — Encounter: Payer: Self-pay | Admitting: Surgery

## 2018-10-03 ENCOUNTER — Ambulatory Visit: Payer: Self-pay | Admitting: Surgery

## 2018-10-03 DIAGNOSIS — K225 Diverticulum of esophagus, acquired: Secondary | ICD-10-CM | POA: Insufficient documentation

## 2018-10-03 HISTORY — DX: Diverticulum of esophagus, acquired: K22.5

## 2018-10-03 NOTE — H&P (View-Only) (Signed)
Shannon Ortiz Documented: 10/03/2018 9:07 AM Location: [facility] Patient #: [id number] DOB: [birth year] Single / Language: Cleophus Molt / Race: Black or African American Female  History of Present Illness Shannon Hector MD; 10/03/2018 12:16 PM) The patient is a 83 year old female who presents with dysphagia. Note for "Dysphagia": ` ` ` Patient sent for surgical consultation at the request of Dr Ronnette Juniper, [facility]  Chief Complaint: Worsening dysphagia with distal esophageal stricture and esophageal diverticulum. Consider surgery ` ` The patient is elderly woman that is struggle with intermittent dysphagia. Became more persistent. Got to the point she couldn't keep hardly anything down. Her weight dropped from around 210 to now around 170. Unintentional weight loss. Came into the emergency room last month for concerns.. Barium swallow confirmed distal esophageal stricture with side diverticulum. Gastroenterology was consulted. Endoscopy confirmed a distal esophageal diverticulum with Schatzki's drain and stricture. Underwent balloon dilatation. Still symptomatic. Discharged postprocedure day 1. Followed up with gastroenterology a few days later. Recommendation made for surgical evaluation.  Patient comes in today with her daughter. Daughter concerned. Patient notes she is felt a little better with the dilatation but still is focusing mainly on liquids some an some occasional soft foods. She never had much in the way of heartburn and reflux issues except for the past few months. She's never seen a gastrologist for 4. She can walk 20 minutes without difficulty. She does use a cane for balance. Had a cholecystectomy done laparoscopically. C-section many decades ago. She tends to have constipation now. She is to go to [place] 3 times a week. Now she is going maybe once a week. She does not smoke. Not diabetic. She did have an episode of near syncope due to bradycardia on  some medications in 2015. Medications were adjusted and she's had no problems since. No personal nor family history of GI/colon cancer, inflammatory bowel disease, irritable bowel syndrome, allergy such as Celiac Sprue, dietary/dairy problems, colitis, ulcers nor gastritis. No recent sick contacts/gastroenteritis. No travel outside the country. No changes in diet. No hematochezia, hematemesis, coffee ground emesis. No evidence of prior gastric/peptic ulceration.  (Review of systems as stated in this history (HPI) or in the review of systems. Otherwise all other 12 point ROS are negative) ` ` `   Past Surgical History Nance Pew, CMA; 10/03/2018 9:07 AM) No pertinent past surgical history  Diagnostic Studies History Nance Pew, CMA; 10/03/2018 9:07 AM) Pap Smear 1-5 years ago  Allergies Nance Pew, CMA; 10/03/2018 9:08 AM) No Known Allergies [10/03/2018]: No Known Drug Allergies [10/03/2018]: Allergies Reconciled  Medication History Nance Pew, CMA; 10/03/2018 9:09 AM) Sucralfate (1GM Tablet, Oral) Active. Pantoprazole Sodium (40MG  Tablet DR, Oral) Active. Losartan Potassium-HCTZ (100-25MG  Tablet, Oral) Active. Bisacodyl (10MG  Suppository, Rectal) Active. amLODIPine Besylate (2.5MG  Tablet, Oral) Active. Medications Reconciled  Social History Nance Pew, CMA; 10/03/2018 9:07 AM) No alcohol use No caffeine use No drug use Tobacco use Never smoker.  Family History Nance Pew, [place]; 10/03/2018 9:07 AM) Depression Daughter. Hypertension Brother, Mother, Sister.  Pregnancy / Birth History Nance Pew, [place]; 10/03/2018 9:07 AM) Age at menarche 21 years. Gravida 34 Maternal age 53-35 Para 87  Other Problems Nance Pew, CMA; 10/03/2018 9:07 AM) Depression Gastroesophageal Reflux Disease High blood pressure     Review of Systems (Sabrina Canty CMA; 10/03/2018 9:07 AM) General Present- Appetite Loss, Fatigue and Weight Loss.  Not Present- Chills, Fever, Night Sweats and Weight Gain. Gastrointestinal Present- Change in Bowel Habits, Difficulty Swallowing, Excessive gas  and Gets full quickly at meals. Not Present- Abdominal Pain, Bloating, Bloody Stool, Chronic diarrhea, Constipation, Hemorrhoids, Indigestion, Nausea, Rectal Pain and Vomiting. Musculoskeletal Present- Joint Stiffness and Muscle Weakness. Not Present- Back Pain, Joint Pain, Muscle Pain and Swelling of Extremities.  Vitals (Sabrina Canty CMA; 10/03/2018 9:10 AM) 10/03/2018 9:09 AM Weight: 168.2 lb Height: 67in Body Surface Area: 1.88 m Body Mass Index: 26.34 kg/m  Temp.: 98.68F(Oral)  Pulse: 109 (Regular)  P.OX: 97% (Room air) BP: 160/82 (Sitting, Left Arm, Standard)      Physical Exam Shannon Hector MD; 10/03/2018 9:33 AM)  General Mental Status-Alert. General Appearance-Not in acute distress, Not Sickly. Orientation-Oriented X3. Hydration-Well hydrated. Voice-Normal.  Integumentary Global Assessment Upon inspection and palpation of skin surfaces of the - Axillae: non-tender, no inflammation or ulceration, no drainage. and Distribution of scalp and body hair is normal. General Characteristics Temperature - normal warmth is noted.  Head and Neck Head-normocephalic, atraumatic with no lesions or palpable masses. Face Global Assessment - atraumatic, no absence of expression. Neck Global Assessment - no abnormal movements, no bruit auscultated on the right, no bruit auscultated on the left, no decreased range of motion, non-tender. Trachea-midline. Thyroid Gland Characteristics - non-tender.  Eye Eyeball - Left-Extraocular movements intact, No Nystagmus. Eyeball - Right-Extraocular movements intact, No Nystagmus. Cornea - Left-No Hazy. Cornea - Right-No Hazy. Sclera/Conjunctiva - Left-No scleral icterus, No Discharge. Sclera/Conjunctiva - Right-No scleral icterus, No Discharge. Pupil -  Left-Direct reaction to light normal. Pupil - Right-Direct reaction to light normal.  ENMT Ears Pinna - Left - no drainage observed, no generalized tenderness observed. Right - no drainage observed, no generalized tenderness observed. Nose and Sinuses External Inspection of the Nose - no destructive lesion observed. Inspection of the nares - Left - quiet respiration. Right - quiet respiration. Mouth and Throat Lips - Upper Lip - no fissures observed, no pallor noted. Lower Lip - no fissures observed, no pallor noted. Nasopharynx - no discharge present. Oral Cavity/Oropharynx - Tongue - no dryness observed. Oral Mucosa - no cyanosis observed. Hypopharynx - no evidence of airway distress observed.  Chest and Lung Exam Inspection Movements - Normal and Symmetrical. Accessory muscles - No use of accessory muscles in breathing. Palpation Palpation of the chest reveals - Non-tender. Auscultation Breath sounds - Normal and Clear.  Cardiovascular Auscultation Rhythm - Regular. Murmurs & Other Heart Sounds - Auscultation of the heart reveals - No Murmurs and No Systolic Clicks.  Abdomen Inspection Inspection of the abdomen reveals - No Visible peristalsis and No Abnormal pulsations. Umbilicus - No Bleeding, No Urine drainage. Palpation/Percussion Palpation and Percussion of the abdomen reveal - Soft, Non Tender, No Rebound tenderness, No Rigidity (guarding) and No Cutaneous hyperesthesia. Note: Abdomen soft. Not severely distended. No distasis recti. Small hernia at bellybutton 5 mm on Valsalva only. No other anterior abdominal wall hernias  Female Genitourinary Sexual Maturity Tanner 5 - Adult hair pattern. Note: No vaginal bleeding nor discharge  Peripheral Vascular Upper Extremity Inspection - Left - No Cyanotic nailbeds, Not Ischemic. Right - No Cyanotic nailbeds, Not Ischemic.  Neurologic Neurologic evaluation reveals -normal attention span and ability to concentrate,  able to name objects and repeat phrases. Appropriate fund of knowledge , normal sensation and normal coordination. Mental Status Affect - not angry, not paranoid. Cranial Nerves-Normal Bilaterally. Gait-Normal.  Neuropsychiatric Mental status exam performed with findings of-able to articulate well with normal speech/language, rate, volume and coherence, thought content normal with ability to perform basic computations and apply abstract reasoning  and no evidence of hallucinations, delusions, obsessions or homicidal/suicidal ideation.  Musculoskeletal Global Assessment Spine, Ribs and Pelvis - no instability, subluxation or laxity. Right Upper Extremity - no instability, subluxation or laxity.  Lymphatic Head & Neck  General Head & Neck Lymphatics: Bilateral - Description - No Localized lymphadenopathy. Axillary  General Axillary Region: Bilateral - Description - No Localized lymphadenopathy. Femoral & Inguinal  Generalized Femoral & Inguinal Lymphatics: Left - Description - No Localized lymphadenopathy. Right - Description - No Localized lymphadenopathy.    Assessment & Plan Shannon Hector MD; 10/03/2018 12:20 PM)  EPIPHRENIC DIVERTICULUM (K22.5) Impression: Distal esophageal epiphrenic diverticulum most likely due to chronic esophageal stricturing. Standard of care is resection of the diverticulum parallel to the esophagus most likely over a bougie with contralateral myotomy and fundoplication to protect the repair. Most likely a partial wrap. Toupet versus door depending on where the diverticulum is oriented. Minimally invasive approach. Most likely robotic. Endoscopy.  Cardiac clearance since she walks with a cane is 83 years old despite decent tolerance. Make sure were not missing anything.  I would get manometry preoperatively get a sense of esophageal motility. Make sure there is no issues there as well. Trying get this done more urgently since she cannot wait several  months with her unintentional weight loss.  Endoscopy disproved any obvious retained food or delayed gastric emptying. Upper GI argued against that as well I do not think that is a factor. We'll hold off on any gastric emptying study at this time.  I discussed with the patient and her daughter. Questions answered. They wish to be aggressive and proceed with surgery as soon as possible. Hopefully the dilation done last month and will buy [place] some time, but I don't think this can wait several months.  Current Plans You are being scheduled for surgery- Our schedulers will call you.  You should hear from our office's scheduling department within 5 working days about the location, date, and time of surgery. We try to make accommodations for patient's preferences in scheduling surgery, but sometimes the OR schedule or the surgeon's schedule prevents [place] from making those accommodations.  If you have not heard from our office [phone]) in 5 working days, call the office and ask for your surgeon's nurse.  If you have other questions about your diagnosis, plan, or surgery, call the office and ask for your surgeon's nurse.  Follow Up - Call [facility] office after tests / studies doneto discuss further plans Pt Education - [facility] Esophageal Surgery Diet HCI (Mallie Giambra): discussed with patient and provided information. Pt Education - [facility] Laparoscopic Surgery HCI The anatomy & physiology of the foregut and anti-reflux mechanism was discussed. The pathophysiology of esophageal stricturing and diverticulum with GERD was discussed. Natural history risks without surgery was discussed. The patient's symptoms are not adequately controlled by medicines and other non-operative treatments. She's only had temporary help with the endoscopy. I feel the risks of no intervention will lead to serious problems that outweigh the operative risks; therefore, I recommended surgery to resect the chronic esophageal diverticulum with  myotomy and fundoplication to help open up the lower esophagus and prevent leakage. Rebuild the anti-reflux valve and control reflux better. Need for a thorough workup to rule out the differential diagnosis and plan treatment was explained. I explained minimally invasive robotic / laparoscopic techniques with possible need for an open approach.  Risks such as bleeding, infection, abscess, leak, need for further treatment, heart attack, death, and other risks were discussed.  I noted a good likelihood this will help address the problem. Goals of post-operative recovery were discussed as well. Possibility that this will not correct all symptoms was explained. Post-operative dysphagia, need for short-term liquid & pureed diet, inability to vomit, possibility of reherniation, possible need for medicines to help control symptoms in addition to surgery were discussed. We will work to minimize complications. Educational handouts further explaining the pathology, treatment options, and dysphagia diet was given as well. Questions were answered. The patient expresses understanding & wishes to proceed with surgery.  I recommended obtaining preoperative cardiac clearance. I am concerned about the health of the patient and the ability to tolerate the operation. Therefore, we will request clearance by cardiology to better assess operative risk & see if a reevaluation, further workup, etc is needed. Also recommendations on how medications such as for anticoagulation and blood pressure should be managed/held/restarted after surgery.  BENIGN ESOPHAGEAL STRICTURE (K22.2) Impression: Most likely heartburn and reflux stricturing causing impulsion distal esophageal diverticulum. Hopefully with myotomy and partial reconstruction we can open this up more widely.   SCHATZKI'S RING (K22.2) Impression: Suspect congenital and not a major obstructing issue. Already dilated.   UNINTENTIONAL WEIGHT LOSS OF 10% BODY  WEIGHT WITHIN 6 MONTHS (R63.4) Impression: Worsening dysphagia to solids with weighed around 210 now around 170 pounds. A little more stable with more aggressive exercise and supplemental shakes. I'm concerned that this will progress and as we do something surgically. Daughter feel strongly with proceeding with surgery.  Shannon Hector, MD, FACS, MASCRS Gastrointestinal and Minimally Invasive Surgery    [address] [place], [place] [address] [phone] Main / Paging [phone] Fax

## 2018-10-03 NOTE — H&P (Signed)
Arnette Norris Documented: 10/03/2018 9:07 AM Location: [facility] Patient #: [id number] DOB: [birth year] Single / Language: Cleophus Molt / Race: Black or African American Female  History of Present Illness Adin Hector MD; 10/03/2018 12:16 PM) The patient is a 83 year old female who presents with dysphagia. Note for "Dysphagia": ` ` ` Patient sent for surgical consultation at the request of Dr Ronnette Juniper, [facility]  Chief Complaint: Worsening dysphagia with distal esophageal stricture and esophageal diverticulum. Consider surgery ` ` The patient is elderly woman that is struggle with intermittent dysphagia. Became more persistent. Got to the point she couldn't keep hardly anything down. Her weight dropped from around 210 to now around 170. Unintentional weight loss. Came into the emergency room last month for concerns.. Barium swallow confirmed distal esophageal stricture with side diverticulum. Gastroenterology was consulted. Endoscopy confirmed a distal esophageal diverticulum with Schatzki's drain and stricture. Underwent balloon dilatation. Still symptomatic. Discharged postprocedure day 1. Followed up with gastroenterology a few days later. Recommendation made for surgical evaluation.  Patient comes in today with her daughter. Daughter concerned. Patient notes she is felt a little better with the dilatation but still is focusing mainly on liquids some an some occasional soft foods. She never had much in the way of heartburn and reflux issues except for the past few months. She's never seen a gastrologist for 4. She can walk 20 minutes without difficulty. She does use a cane for balance. Had a cholecystectomy done laparoscopically. C-section many decades ago. She tends to have constipation now. She is to go to [place] 3 times a week. Now she is going maybe once a week. She does not smoke. Not diabetic. She did have an episode of near syncope due to bradycardia on  some medications in 2015. Medications were adjusted and she's had no problems since. No personal nor family history of GI/colon cancer, inflammatory bowel disease, irritable bowel syndrome, allergy such as Celiac Sprue, dietary/dairy problems, colitis, ulcers nor gastritis. No recent sick contacts/gastroenteritis. No travel outside the country. No changes in diet. No hematochezia, hematemesis, coffee ground emesis. No evidence of prior gastric/peptic ulceration.  (Review of systems as stated in this history (HPI) or in the review of systems. Otherwise all other 12 point ROS are negative) ` ` `   Past Surgical History Nance Pew, CMA; 10/03/2018 9:07 AM) No pertinent past surgical history  Diagnostic Studies History Nance Pew, CMA; 10/03/2018 9:07 AM) Pap Smear 1-5 years ago  Allergies Nance Pew, CMA; 10/03/2018 9:08 AM) No Known Allergies [10/03/2018]: No Known Drug Allergies [10/03/2018]: Allergies Reconciled  Medication History Nance Pew, CMA; 10/03/2018 9:09 AM) Sucralfate (1GM Tablet, Oral) Active. Pantoprazole Sodium (40MG  Tablet DR, Oral) Active. Losartan Potassium-HCTZ (100-25MG  Tablet, Oral) Active. Bisacodyl (10MG  Suppository, Rectal) Active. amLODIPine Besylate (2.5MG  Tablet, Oral) Active. Medications Reconciled  Social History Nance Pew, CMA; 10/03/2018 9:07 AM) No alcohol use No caffeine use No drug use Tobacco use Never smoker.  Family History Nance Pew, [place]; 10/03/2018 9:07 AM) Depression Daughter. Hypertension Brother, Mother, Sister.  Pregnancy / Birth History Nance Pew, [place]; 10/03/2018 9:07 AM) Age at menarche 87 years. Gravida 64 Maternal age 81-35 Para 25  Other Problems Nance Pew, CMA; 10/03/2018 9:07 AM) Depression Gastroesophageal Reflux Disease High blood pressure     Review of Systems (Sabrina Canty CMA; 10/03/2018 9:07 AM) General Present- Appetite Loss, Fatigue and Weight Loss.  Not Present- Chills, Fever, Night Sweats and Weight Gain. Gastrointestinal Present- Change in Bowel Habits, Difficulty Swallowing, Excessive gas  and Gets full quickly at meals. Not Present- Abdominal Pain, Bloating, Bloody Stool, Chronic diarrhea, Constipation, Hemorrhoids, Indigestion, Nausea, Rectal Pain and Vomiting. Musculoskeletal Present- Joint Stiffness and Muscle Weakness. Not Present- Back Pain, Joint Pain, Muscle Pain and Swelling of Extremities.  Vitals (Sabrina Canty CMA; 10/03/2018 9:10 AM) 10/03/2018 9:09 AM Weight: 168.2 lb Height: 67in Body Surface Area: 1.88 m Body Mass Index: 26.34 kg/m  Temp.: 98.53F(Oral)  Pulse: 109 (Regular)  P.OX: 97% (Room air) BP: 160/82 (Sitting, Left Arm, Standard)      Physical Exam Adin Hector MD; 10/03/2018 9:33 AM)  General Mental Status-Alert. General Appearance-Not in acute distress, Not Sickly. Orientation-Oriented X3. Hydration-Well hydrated. Voice-Normal.  Integumentary Global Assessment Upon inspection and palpation of skin surfaces of the - Axillae: non-tender, no inflammation or ulceration, no drainage. and Distribution of scalp and body hair is normal. General Characteristics Temperature - normal warmth is noted.  Head and Neck Head-normocephalic, atraumatic with no lesions or palpable masses. Face Global Assessment - atraumatic, no absence of expression. Neck Global Assessment - no abnormal movements, no bruit auscultated on the right, no bruit auscultated on the left, no decreased range of motion, non-tender. Trachea-midline. Thyroid Gland Characteristics - non-tender.  Eye Eyeball - Left-Extraocular movements intact, No Nystagmus. Eyeball - Right-Extraocular movements intact, No Nystagmus. Cornea - Left-No Hazy. Cornea - Right-No Hazy. Sclera/Conjunctiva - Left-No scleral icterus, No Discharge. Sclera/Conjunctiva - Right-No scleral icterus, No Discharge. Pupil -  Left-Direct reaction to light normal. Pupil - Right-Direct reaction to light normal.  ENMT Ears Pinna - Left - no drainage observed, no generalized tenderness observed. Right - no drainage observed, no generalized tenderness observed. Nose and Sinuses External Inspection of the Nose - no destructive lesion observed. Inspection of the nares - Left - quiet respiration. Right - quiet respiration. Mouth and Throat Lips - Upper Lip - no fissures observed, no pallor noted. Lower Lip - no fissures observed, no pallor noted. Nasopharynx - no discharge present. Oral Cavity/Oropharynx - Tongue - no dryness observed. Oral Mucosa - no cyanosis observed. Hypopharynx - no evidence of airway distress observed.  Chest and Lung Exam Inspection Movements - Normal and Symmetrical. Accessory muscles - No use of accessory muscles in breathing. Palpation Palpation of the chest reveals - Non-tender. Auscultation Breath sounds - Normal and Clear.  Cardiovascular Auscultation Rhythm - Regular. Murmurs & Other Heart Sounds - Auscultation of the heart reveals - No Murmurs and No Systolic Clicks.  Abdomen Inspection Inspection of the abdomen reveals - No Visible peristalsis and No Abnormal pulsations. Umbilicus - No Bleeding, No Urine drainage. Palpation/Percussion Palpation and Percussion of the abdomen reveal - Soft, Non Tender, No Rebound tenderness, No Rigidity (guarding) and No Cutaneous hyperesthesia. Note: Abdomen soft. Not severely distended. No distasis recti. Small hernia at bellybutton 5 mm on Valsalva only. No other anterior abdominal wall hernias  Female Genitourinary Sexual Maturity Tanner 5 - Adult hair pattern. Note: No vaginal bleeding nor discharge  Peripheral Vascular Upper Extremity Inspection - Left - No Cyanotic nailbeds, Not Ischemic. Right - No Cyanotic nailbeds, Not Ischemic.  Neurologic Neurologic evaluation reveals -normal attention span and ability to concentrate,  able to name objects and repeat phrases. Appropriate fund of knowledge , normal sensation and normal coordination. Mental Status Affect - not angry, not paranoid. Cranial Nerves-Normal Bilaterally. Gait-Normal.  Neuropsychiatric Mental status exam performed with findings of-able to articulate well with normal speech/language, rate, volume and coherence, thought content normal with ability to perform basic computations and apply abstract reasoning  and no evidence of hallucinations, delusions, obsessions or homicidal/suicidal ideation.  Musculoskeletal Global Assessment Spine, Ribs and Pelvis - no instability, subluxation or laxity. Right Upper Extremity - no instability, subluxation or laxity.  Lymphatic Head & Neck  General Head & Neck Lymphatics: Bilateral - Description - No Localized lymphadenopathy. Axillary  General Axillary Region: Bilateral - Description - No Localized lymphadenopathy. Femoral & Inguinal  Generalized Femoral & Inguinal Lymphatics: Left - Description - No Localized lymphadenopathy. Right - Description - No Localized lymphadenopathy.    Assessment & Plan Adin Hector MD; 10/03/2018 12:20 PM)  EPIPHRENIC DIVERTICULUM (K22.5) Impression: Distal esophageal epiphrenic diverticulum most likely due to chronic esophageal stricturing. Standard of care is resection of the diverticulum parallel to the esophagus most likely over a bougie with contralateral myotomy and fundoplication to protect the repair. Most likely a partial wrap. Toupet versus door depending on where the diverticulum is oriented. Minimally invasive approach. Most likely robotic. Endoscopy.  Cardiac clearance since she walks with a cane is 83 years old despite decent tolerance. Make sure were not missing anything.  I would get manometry preoperatively get a sense of esophageal motility. Make sure there is no issues there as well. Trying get this done more urgently since she cannot wait several  months with her unintentional weight loss.  Endoscopy disproved any obvious retained food or delayed gastric emptying. Upper GI argued against that as well I do not think that is a factor. We'll hold off on any gastric emptying study at this time.  I discussed with the patient and her daughter. Questions answered. They wish to be aggressive and proceed with surgery as soon as possible. Hopefully the dilation done last month and will buy Shannon Ortiz some time, but I don't think this can wait several months.  Current Plans You are being scheduled for surgery- Our schedulers will call you.  You should hear from our office's scheduling department within 5 working days about the location, date, and time of surgery. We try to make accommodations for patient's preferences in scheduling surgery, but sometimes the OR schedule or the surgeon's schedule prevents Shannon Ortiz from making those accommodations.  If you have not heard from our office [phone]) in 5 working days, call the office and ask for your surgeon's nurse.  If you have other questions about your diagnosis, plan, or surgery, call the office and ask for your surgeon's nurse.  Follow Up - Call [facility] office after tests / studies doneto discuss further plans Pt Education - [facility] Esophageal Surgery Diet HCI (Willim Turnage): discussed with patient and provided information. Pt Education - [facility] Laparoscopic Surgery HCI The anatomy & physiology of the foregut and anti-reflux mechanism was discussed. The pathophysiology of esophageal stricturing and diverticulum with GERD was discussed. Natural history risks without surgery was discussed. The patient's symptoms are not adequately controlled by medicines and other non-operative treatments. She's only had temporary help with the endoscopy. I feel the risks of no intervention will lead to serious problems that outweigh the operative risks; therefore, I recommended surgery to resect the chronic esophageal diverticulum with  myotomy and fundoplication to help open up the lower esophagus and prevent leakage. Rebuild the anti-reflux valve and control reflux better. Need for a thorough workup to rule out the differential diagnosis and plan treatment was explained. I explained minimally invasive robotic / laparoscopic techniques with possible need for an open approach.  Risks such as bleeding, infection, abscess, leak, need for further treatment, heart attack, death, and other risks were discussed.  I noted a good likelihood this will help address the problem. Goals of post-operative recovery were discussed as well. Possibility that this will not correct all symptoms was explained. Post-operative dysphagia, need for short-term liquid & pureed diet, inability to vomit, possibility of reherniation, possible need for medicines to help control symptoms in addition to surgery were discussed. We will work to minimize complications. Educational handouts further explaining the pathology, treatment options, and dysphagia diet was given as well. Questions were answered. The patient expresses understanding & wishes to proceed with surgery.  I recommended obtaining preoperative cardiac clearance. I am concerned about the health of the patient and the ability to tolerate the operation. Therefore, we will request clearance by cardiology to better assess operative risk & see if a reevaluation, further workup, etc is needed. Also recommendations on how medications such as for anticoagulation and blood pressure should be managed/held/restarted after surgery.  BENIGN ESOPHAGEAL STRICTURE (K22.2) Impression: Most likely heartburn and reflux stricturing causing impulsion distal esophageal diverticulum. Hopefully with myotomy and partial reconstruction we can open this up more widely.   SCHATZKI'S RING (K22.2) Impression: Suspect congenital and not a major obstructing issue. Already dilated.   UNINTENTIONAL WEIGHT LOSS OF 10% BODY  WEIGHT WITHIN 6 MONTHS (R63.4) Impression: Worsening dysphagia to solids with weighed around 210 now around 170 pounds. A little more stable with more aggressive exercise and supplemental shakes. I'm concerned that this will progress and as we do something surgically. Daughter feel strongly with proceeding with surgery.  Adin Hector, MD, FACS, MASCRS Gastrointestinal and Minimally Invasive Surgery    [address], [place], [place] [address] [phone] Main / Paging [phone] Fax

## 2018-10-04 ENCOUNTER — Telehealth: Payer: Self-pay | Admitting: *Deleted

## 2018-10-04 ENCOUNTER — Telehealth: Payer: Self-pay

## 2018-10-04 NOTE — Telephone Encounter (Signed)
Opened in ERROR

## 2018-10-04 NOTE — Telephone Encounter (Signed)
CARDIAC CLEARANCE REQUEST GIVEN TO CMA DANIELLE DISHER AND NOTES ON FILE FROM DR. GROSS [phone].

## 2018-10-05 ENCOUNTER — Other Ambulatory Visit: Payer: Self-pay | Admitting: Gastroenterology

## 2018-10-06 NOTE — Progress Notes (Signed)
CARDIOLOGY CONSULT NOTE       Patient ID: Shannon Ortiz MRN: [id number] DOB/AGE: May 15, 1936 83 y.o.  Admit date: (Not on file) Referring Physician: Pro[place] Primary Physician: Lawerance Cruel, MD Primary Cardiologist: New/Lawrencia Mauney Reason for Consultation: Preoperative Cardiac Evaluation    HPI:  83 y.o. referred by Dr Laqueta Due for preoperative clearance .  Reviewed office note from 08/15/18.  Complaining of jaw pain with pain in back molars worse with eating and hot / cold liquid Atypical pain after eating fruit sharp History of GERD Also complained of lightheadedness and fatigue Started on Valacyclovir for facial pain Not orthostatic BP actually elevated commented about white coat syndrome No BP meds prescribed ECG in office non acute SR rate 76 RSR; LAE to my eye slight ST segment depression in 2,3,F   D/c from hospital 09/02/18 developed dysphagia with distal esophageal stenosis EGD had dilatation of Schatzki ring and botulinum injection Did not note further w/u or colonoscopy for iron deficiency anemia d/c meds for BP included losartan 100/25 and norvasc 5 mg   She had some manometry on 4/22 results not available She indicates she may need surgery for a ? Diverticulum in her esophagus  She is not having any residual dysphagia. Having mostly soft solid diet   ROS All other systems reviewed and negative except as noted above  Past Medical History:  Diagnosis Date  . Acquired diverticulum of distal esophagus with dysphagia 10/03/2018  . AKI (acute kidney injury) ([place]) 08/31/2018  . Anxiety   . Bradycardia 02/16/2014  . Chest tightness   . DDD (degenerative disc disease), lumbar   . DJD (degenerative joint disease)   . Dry eyes   . Esophageal stenosis 08/31/2018  . Facial pain   . Fibromyalgia   . Hemorrhoid   . Hypertension   . Hypokalemia 08/31/2018  . Hypothyroidism   . Lightheadedness   . Near syncope 02/16/2014  . Osteopenia   . Sigmoid diverticulosis   . Thyroid cancer ([place])    . Thyroid nodule     Family History  Problem Relation Age of Onset  . Hypertension Mother   . Diabetes Mother        non-insulin dependant  . CAD Brother     Social History   Socioeconomic History  . Marital status: Widowed    Spouse name: Not on file  . Number of children: Not on file  . Years of education: Not on file  . Highest education level: Not on file  Occupational History  . Not on file  Social Needs  . Financial resource strain: Not on file  . Food insecurity:    Worry: Not on file    Inability: Not on file  . Transportation needs:    Medical: Not on file    Non-medical: Not on file  Tobacco Use  . Smoking status: Never Smoker  Substance and Sexual Activity  . Alcohol use: No  . Drug use: No  . Sexual activity: Never  Lifestyle  . Physical activity:    Days per week: Not on file    Minutes per session: Not on file  . Stress: Not on file  Relationships  . Social connections:    Talks on phone: Not on file    Gets together: Not on file    Attends religious service: Not on file    Active member of club or organization: Not on file    Attends meetings of clubs or organizations: Not on file  Relationship status: Not on file  . Intimate partner violence:    Fear of current or ex partner: Not on file    Emotionally abused: Not on file    Physically abused: Not on file    Forced sexual activity: Not on file  Other Topics Concern  . Not on file  Social History Narrative  . Not on file    Past Surgical History:  Procedure Laterality Date  . BALLOON DILATION N/A 09/01/2018   Procedure: BALLOON DILATION;  Surgeon: Ronnette Juniper, MD;  Location: [facility];  Service: Gastroenterology;  Laterality: N/A;  . BIOPSY  09/01/2018   Procedure: BIOPSY;  Surgeon: Ronnette Juniper, MD;  Location: [facility] END[facility]COPY;  Service: Gastroenterology;;  . Lum Keas INJECTION  09/01/2018   Procedure: BOTOX INJECTION;  Surgeon: Ronnette Juniper, MD;  Location: [place] [facility];  Service:  Gastroenterology;;  . BREAST SURGERY     breast biopsy  . CHOLECYSTECTOMY    . ESOPHAGOGASTRODUODENOSCOPY (EGD) WITH PROPOFOL N/A 09/01/2018   Procedure: ESOPHAGOGASTRODUODENOSCOPY (EGD) WITH PROPOFOL;  Surgeon: Ronnette Juniper, MD;  Location: [place];  Service: Gastroenterology;  Laterality: N/A;        Physical Exam: There were no vitals taken for this visit.    Affect appropriate Healthy:  appears stated age 62: normal Neck supple with no adenopathy JVP normal no bruits no thyromegaly Lungs clear with no wheezing and good diaphragmatic motion Heart:  S1/S2 no murmur, no rub, gallop or click PMI normal Abdomen: benighn, BS positve, no tenderness, no AAA no bruit.  No HSM or HJR Distal pulses intact with no bruits No edema Neuro non-focal Skin warm and dry No muscular weakness   Labs:   Lab Results  Component Value Date   WBC 4.2 09/02/2018   HGB 9.1 (L) 09/02/2018   HCT 29.5 (L) 09/02/2018   MCV 72.7 (L) 09/02/2018   PLT 183 09/02/2018   No results for input(s): NA, K, CL, CO2, BUN, CREATININE, CALCIUM, PROT, BILITOT, ALKPHOS, ALT, AST, GLUCOSE in the last 168 hours.  Invalid input(s): LABALBU Lab Results  Component Value Date   TROPONINI <0.30 02/17/2014   No results found for: CHOL No results found for: HDL No results found for: LDLCALC No results found for: TRIG No results found for: CHOLHDL No results found for: LDLDIRECT    Radiology: No results found.  EKG: See HPI     ASSESSMENT AND PLAN:   Chest Pain:  Atypical no need for stress testing clear to have general anesthesia and surgery  HTN: Started on ACE/Diuretic and norvasc f/u primary low sodium diet Anemia:  Hct noted to be 29.5 09/02/18 with low MCV ? No colonoscopy due to age f/u GI Esophageal Stricture:  Post dilatation due to acute dysphagia 09/01/18 on carafate F/U GI would not appear to need further surgery for diverticulum based on symptoms   Signed: Jenkins Rouge 10/06/2018, 3:21  PM

## 2018-10-07 ENCOUNTER — Ambulatory Visit (HOSPITAL_COMMUNITY)
Admission: RE | Admit: 2018-10-07 | Discharge: 2018-10-07 | Disposition: A | Discharge status: Home / Self Care | Payer: Medicare Other | Attending: Gastroenterology | Admitting: Gastroenterology

## 2018-10-07 ENCOUNTER — Encounter (HOSPITAL_COMMUNITY)
Admission: RE | Disposition: A | Discharge status: Home / Self Care | Payer: Self-pay | Source: Home / Self Care | Attending: Gastroenterology

## 2018-10-07 ENCOUNTER — Ambulatory Visit: Payer: Medicare Other | Admitting: Cardiovascular Disease

## 2018-10-07 ENCOUNTER — Encounter: Payer: Self-pay | Admitting: Cardiovascular Disease

## 2018-10-07 ENCOUNTER — Other Ambulatory Visit: Payer: Self-pay

## 2018-10-07 VITALS — BP 120/68 | HR 97 | Ht 67.0 in | Wt 168.6 lb

## 2018-10-07 DIAGNOSIS — I10 Essential (primary) hypertension: Secondary | ICD-10-CM

## 2018-10-07 DIAGNOSIS — R0789 Other chest pain: Secondary | ICD-10-CM

## 2018-10-07 DIAGNOSIS — R1310 Dysphagia, unspecified: Secondary | ICD-10-CM | POA: Insufficient documentation

## 2018-10-07 DIAGNOSIS — R079 Chest pain, unspecified: Secondary | ICD-10-CM

## 2018-10-07 DIAGNOSIS — Z01810 Encounter for preprocedural cardiovascular examination: Secondary | ICD-10-CM

## 2018-10-07 HISTORY — PX: ESOPHAGEAL MANOMETRY: SHX5429

## 2018-10-07 SURGERY — MANOMETRY, ESOPHAGUS

## 2018-10-07 MED ORDER — LIDOCAINE VISCOUS HCL 2 % MT SOLN
OROMUCOSAL | Status: AC
  Filled 2018-10-07: qty 15

## 2018-10-07 SURGICAL SUPPLY — 2 items
FACESHIELD LNG OPTICON STERILE (SAFETY) IMPLANT
GLOVE BIO SURGEON STRL SZ8 (GLOVE) ×4 IMPLANT

## 2018-10-07 NOTE — Patient Instructions (Addendum)
Medication Instructions:   If you need a refill on your cardiac medications before your next appointment, please call your pharmacy.   Lab work:  If you have labs (blood work) drawn today and your tests are completely normal, you will receive your results only by: . MyChart Message (if you have MyChart) OR . A paper copy in the mail If you have any lab test that is abnormal or we need to change your treatment, we will call you to review the results.  Testing/Procedures: None ordered today.  Follow-Up: At [facility], you and your health needs are our priority.  As part of our continuing mission to provide you with exceptional heart care, we have created designated Provider Care Teams.  These Care Teams include your primary Cardiologist (physician) and Advanced Practice Providers (APPs -  Physician Assistants and Nurse Practitioners) who all work together to provide you with the care you need, when you need it. . You will need a follow up appointment as needed with Dr. Nishan.    

## 2018-10-07 NOTE — Progress Notes (Signed)
Esophageal Manometry done per protocol. Patient tolerated well without distress or complication.  

## 2018-10-10 ENCOUNTER — Encounter (HOSPITAL_COMMUNITY): Payer: Self-pay | Admitting: Gastroenterology

## 2018-10-15 ENCOUNTER — Inpatient Hospital Stay (HOSPITAL_COMMUNITY)
Admission: EM | Admit: 2018-10-15 | Discharge: 2018-10-17 | DRG: 391 | Disposition: A | Discharge status: Home / Self Care | Payer: Medicare Other | Attending: Student | Admitting: Student

## 2018-10-15 ENCOUNTER — Emergency Department (HOSPITAL_COMMUNITY): Payer: Medicare Other

## 2018-10-15 ENCOUNTER — Other Ambulatory Visit: Payer: Self-pay

## 2018-10-15 DIAGNOSIS — K222 Esophageal obstruction: Secondary | ICD-10-CM | POA: Diagnosis present

## 2018-10-15 DIAGNOSIS — R1310 Dysphagia, unspecified: Secondary | ICD-10-CM | POA: Diagnosis present

## 2018-10-15 DIAGNOSIS — R4702 Dysphasia: Secondary | ICD-10-CM | POA: Diagnosis present

## 2018-10-15 DIAGNOSIS — R627 Adult failure to thrive: Secondary | ICD-10-CM | POA: Diagnosis present

## 2018-10-15 DIAGNOSIS — Z79899 Other long term (current) drug therapy: Secondary | ICD-10-CM

## 2018-10-15 DIAGNOSIS — F419 Anxiety disorder, unspecified: Secondary | ICD-10-CM | POA: Diagnosis present

## 2018-10-15 DIAGNOSIS — Z1159 Encounter for screening for other viral diseases: Secondary | ICD-10-CM

## 2018-10-15 DIAGNOSIS — N183 Chronic kidney disease, stage 3 (moderate): Secondary | ICD-10-CM | POA: Diagnosis present

## 2018-10-15 DIAGNOSIS — E860 Dehydration: Secondary | ICD-10-CM | POA: Diagnosis present

## 2018-10-15 DIAGNOSIS — E876 Hypokalemia: Secondary | ICD-10-CM | POA: Diagnosis present

## 2018-10-15 DIAGNOSIS — T465X5A Adverse effect of other antihypertensive drugs, initial encounter: Secondary | ICD-10-CM | POA: Diagnosis present

## 2018-10-15 DIAGNOSIS — T502X5A Adverse effect of carbonic-anhydrase inhibitors, benzothiadiazides and other diuretics, initial encounter: Secondary | ICD-10-CM | POA: Diagnosis present

## 2018-10-15 DIAGNOSIS — K5900 Constipation, unspecified: Secondary | ICD-10-CM | POA: Diagnosis present

## 2018-10-15 DIAGNOSIS — I129 Hypertensive chronic kidney disease with stage 1 through stage 4 chronic kidney disease, or unspecified chronic kidney disease: Secondary | ICD-10-CM | POA: Diagnosis present

## 2018-10-15 DIAGNOSIS — K225 Diverticulum of esophagus, acquired: Secondary | ICD-10-CM | POA: Diagnosis present

## 2018-10-15 DIAGNOSIS — E039 Hypothyroidism, unspecified: Secondary | ICD-10-CM | POA: Diagnosis present

## 2018-10-15 DIAGNOSIS — Z85850 Personal history of malignant neoplasm of thyroid: Secondary | ICD-10-CM

## 2018-10-15 DIAGNOSIS — N170 Acute kidney failure with tubular necrosis: Secondary | ICD-10-CM | POA: Diagnosis present

## 2018-10-15 DIAGNOSIS — M797 Fibromyalgia: Secondary | ICD-10-CM | POA: Diagnosis present

## 2018-10-15 DIAGNOSIS — N179 Acute kidney failure, unspecified: Secondary | ICD-10-CM | POA: Diagnosis not present

## 2018-10-15 DIAGNOSIS — I10 Essential (primary) hypertension: Secondary | ICD-10-CM | POA: Diagnosis not present

## 2018-10-15 DIAGNOSIS — R634 Abnormal weight loss: Secondary | ICD-10-CM | POA: Diagnosis not present

## 2018-10-15 LAB — CBC WITH DIFFERENTIAL/PLATELET
Abs Immature Granulocytes: 0.02 10*3/uL (ref 0.00–0.07)
Basophils Absolute: 0 10*3/uL (ref 0.0–0.1)
Basophils Relative: 0 %
Eosinophils Absolute: 0 10*3/uL (ref 0.0–0.5)
Eosinophils Relative: 0 %
HCT: 34.1 % — ABNORMAL LOW (ref 36.0–46.0)
Hemoglobin: 11.2 g/dL — ABNORMAL LOW (ref 12.0–15.0)
Immature Granulocytes: 0 %
Lymphocytes Relative: 37 %
Lymphs Abs: 2.4 10*3/uL (ref 0.7–4.0)
MCH: 23.5 pg — ABNORMAL LOW (ref 26.0–34.0)
MCHC: 32.8 g/dL (ref 30.0–36.0)
MCV: 71.6 fL — ABNORMAL LOW (ref 80.0–100.0)
Monocytes Absolute: 0.7 10*3/uL (ref 0.1–1.0)
Monocytes Relative: 10 %
Neutro Abs: 3.4 10*3/uL (ref 1.7–7.7)
Neutrophils Relative %: 53 %
Platelets: 307 10*3/uL (ref 150–400)
RBC: 4.76 MIL/uL (ref 3.87–5.11)
RDW: 16.2 % — ABNORMAL HIGH (ref 11.5–15.5)
WBC: 6.6 10*3/uL (ref 4.0–10.5)
nRBC: 0 % (ref 0.0–0.2)

## 2018-10-15 LAB — TROPONIN I: Troponin I: 0.05 ng/mL (ref <0.03)

## 2018-10-15 LAB — COMPREHENSIVE METABOLIC PANEL
ALT: 14 U/L (ref 0–44)
AST: 27 U/L (ref 15–41)
Albumin: 4.2 g/dL (ref 3.5–5.0)
Alkaline Phosphatase: 45 U/L (ref 38–126)
Anion gap: 17 — ABNORMAL HIGH (ref 5–15)
BUN: 18 mg/dL (ref 8–23)
CO2: 24 mmol/L (ref 22–32)
Calcium: 10.6 mg/dL — ABNORMAL HIGH (ref 8.9–10.3)
Chloride: 95 mmol/L — ABNORMAL LOW (ref 98–111)
Creatinine, Ser: 1.91 mg/dL — ABNORMAL HIGH (ref 0.44–1.00)
GFR calc Af Amer: 28 mL/min — ABNORMAL LOW (ref >60)
GFR calc non Af Amer: 24 mL/min — ABNORMAL LOW (ref >60)
Glucose, Bld: 147 mg/dL — ABNORMAL HIGH (ref 70–99)
Potassium: 2.5 mmol/L — CL (ref 3.5–5.1)
Sodium: 136 mmol/L (ref 135–145)
Total Bilirubin: 1.3 mg/dL — ABNORMAL HIGH (ref 0.3–1.2)
Total Protein: 7.5 g/dL (ref 6.5–8.1)

## 2018-10-15 LAB — PREALBUMIN: Prealbumin: 15.4 mg/dL — ABNORMAL LOW (ref 18–38)

## 2018-10-15 LAB — SARS CORONAVIRUS 2 BY RT PCR (HOSPITAL ORDER, PERFORMED IN ~~LOC~~ HOSPITAL LAB): SARS Coronavirus 2: NEGATIVE

## 2018-10-15 LAB — CBG MONITORING, ED: Glucose-Capillary: 135 mg/dL — ABNORMAL HIGH (ref 70–99)

## 2018-10-15 LAB — MAGNESIUM: Magnesium: 1.6 mg/dL — ABNORMAL LOW (ref 1.7–2.4)

## 2018-10-15 MED ORDER — ACETAMINOPHEN 650 MG RE SUPP: 650.0000 mg | Freq: Four times a day (QID) | RECTAL | Status: DC | PRN

## 2018-10-15 MED ORDER — ONDANSETRON HCL 4 MG PO TABS: 4.0000 mg | ORAL_TABLET | Freq: Four times a day (QID) | ORAL | Status: DC | PRN

## 2018-10-15 MED ORDER — LACTATED RINGERS IV BOLUS
1000.0000 mL | Freq: Once | INTRAVENOUS | Status: AC
  Administered 2018-10-15: 1000 mL via INTRAVENOUS

## 2018-10-15 MED ORDER — SENNOSIDES-DOCUSATE SODIUM 8.6-50 MG PO TABS: 2.0000 | ORAL_TABLET | Freq: Every evening | ORAL | Status: DC | PRN

## 2018-10-15 MED ORDER — ENOXAPARIN SODIUM 30 MG/0.3ML ~~LOC~~ SOLN
30.0000 mg | SUBCUTANEOUS | Status: DC
  Administered 2018-10-15 – 2018-10-16 (×2): 30 mg via SUBCUTANEOUS
  Filled 2018-10-15 (×2): qty 0.3

## 2018-10-15 MED ORDER — AMLODIPINE BESYLATE 5 MG PO TABS
5.0000 mg | ORAL_TABLET | Freq: Every day | ORAL | Status: DC
  Administered 2018-10-16 – 2018-10-17 (×2): 5 mg via ORAL
  Filled 2018-10-15 (×2): qty 1

## 2018-10-15 MED ORDER — SODIUM CHLORIDE 0.9 % IV BOLUS
1000.0000 mL | Freq: Once | INTRAVENOUS | Status: AC
  Administered 2018-10-15: 1000 mL via INTRAVENOUS

## 2018-10-15 MED ORDER — POLYETHYLENE GLYCOL 3350 17 G PO PACK
17.0000 g | PACK | Freq: Every day | ORAL | Status: DC
  Administered 2018-10-16 – 2018-10-17 (×2): 17 g via ORAL
  Filled 2018-10-15 (×2): qty 1

## 2018-10-15 MED ORDER — ENSURE SURGERY PO LIQD
237.0000 mL | Freq: Two times a day (BID) | ORAL | Status: DC
  Administered 2018-10-16 – 2018-10-17 (×3): 237 mL via ORAL
  Filled 2018-10-15 (×5): qty 237

## 2018-10-15 MED ORDER — PANTOPRAZOLE SODIUM 40 MG PO TBEC
40.0000 mg | DELAYED_RELEASE_TABLET | Freq: Every day | ORAL | Status: DC
  Administered 2018-10-16 – 2018-10-17 (×2): 40 mg via ORAL
  Filled 2018-10-15 (×2): qty 1

## 2018-10-15 MED ORDER — POTASSIUM CHLORIDE CRYS ER 20 MEQ PO TBCR
40.0000 meq | EXTENDED_RELEASE_TABLET | Freq: Once | ORAL | Status: AC
  Administered 2018-10-15: 40 meq via ORAL
  Filled 2018-10-15: qty 2

## 2018-10-15 MED ORDER — MAGNESIUM SULFATE 2 GM/50ML IV SOLN
2.0000 g | Freq: Once | INTRAVENOUS | Status: AC
  Administered 2018-10-15: 2 g via INTRAVENOUS
  Filled 2018-10-15: qty 50

## 2018-10-15 MED ORDER — SODIUM CHLORIDE 0.9 % IV SOLN: Freq: Once | INTRAVENOUS | Status: DC

## 2018-10-15 MED ORDER — ACETAMINOPHEN 325 MG PO TABS: 650.0000 mg | ORAL_TABLET | Freq: Four times a day (QID) | ORAL | Status: DC | PRN

## 2018-10-15 MED ORDER — DOCUSATE SODIUM 100 MG PO CAPS
100.0000 mg | ORAL_CAPSULE | Freq: Two times a day (BID) | ORAL | Status: DC
  Administered 2018-10-16 – 2018-10-17 (×3): 100 mg via ORAL
  Filled 2018-10-15 (×3): qty 1

## 2018-10-15 MED ORDER — POTASSIUM CHLORIDE 10 MEQ/100ML IV SOLN
10.0000 meq | INTRAVENOUS | Status: AC
  Administered 2018-10-15 (×3): 10 meq via INTRAVENOUS
  Filled 2018-10-15: qty 100

## 2018-10-15 MED ORDER — BISACODYL 5 MG PO TBEC
5.0000 mg | DELAYED_RELEASE_TABLET | Freq: Every day | ORAL | Status: DC | PRN
  Administered 2018-10-15: 5 mg via ORAL
  Filled 2018-10-15: qty 1

## 2018-10-15 MED ORDER — LACTATED RINGERS IV SOLN
INTRAVENOUS | Status: DC
  Administered 2018-10-15 – 2018-10-16 (×3): via INTRAVENOUS
  Administered 2018-10-17: 1000 mL via INTRAVENOUS

## 2018-10-15 MED ORDER — ONDANSETRON HCL 4 MG/2ML IJ SOLN: 4.0000 mg | Freq: Four times a day (QID) | INTRAMUSCULAR | Status: DC | PRN

## 2018-10-15 MED ORDER — FLEET ENEMA 7-19 GM/118ML RE ENEM: 1.0000 | ENEMA | Freq: Once | RECTAL | Status: DC | PRN

## 2018-10-15 NOTE — H&P (Signed)
History and Physical    Shannon Ortiz:[id number] DOB: 07/12/1935 DOA: 10/15/2018  PCP: Shannon Cruel, MD Consultants:  Shannon Ortiz - GI; [place] - surgery; Shannon Ortiz - cardiology Patient coming from:  Home - lives with daughter; Shannon Ortiz: Daughter, [phone]  Chief Complaint: Dizziness and weakness  HPI: Shannon Ortiz is a 83 y.o. female with medical history significant of HTN; stage 3 CKD; hypokalemia; and recent hospitalization last month with dysphagia and esophageal stenosis from 3cm esophageal diverticulum and Schatki's ring.  GI performed a dilation procedure and injected Botox.  She subsequently followed up with Dr. Johney Ortiz on 4/20 and is being scheduled for outpatient surgical repair, currently planned for 5/12.   She has not been able to eat since discharge.  She is unable to swallow and does not have much appetite.  She is able to tolerate liquids but nothing solid.  It is some better since her esophagus.  She still is having enough difficulty that she is dehydrated.  She has post-tussive nausea.  She is constipated.    ED Course:  Known esophageal stricture, now with failure to thrive.  Low K+ and Mag++.  EKG with tachycardia to 120-130 on presentation, improved after IVF.  Review of Systems: As per HPI; otherwise review of systems reviewed and negative.   Ambulatory Status:  Ambulates with a cane recently  Past Medical History:  Diagnosis Date  . Acquired diverticulum of distal esophagus with dysphagia 10/03/2018  . AKI (acute kidney injury) ([place]) 08/31/2018  . Anxiety   . Bradycardia 02/16/2014  . Chest tightness   . DDD (degenerative disc disease), lumbar   . DJD (degenerative joint disease)   . Dry eyes   . Esophageal stenosis 08/31/2018  . Facial pain   . Fibromyalgia   . Hemorrhoid   . Hypertension   . Hypokalemia 08/31/2018  . Hypothyroidism   . Lightheadedness   . Near syncope 02/16/2014  . Osteopenia   . Sigmoid diverticulosis   . Thyroid cancer ([place])   . Thyroid nodule      Past Surgical History:  Procedure Laterality Date  . BALLOON DILATION N/A 09/01/2018   Procedure: BALLOON DILATION;  Surgeon: Shannon Juniper, MD;  Location: [place];  Service: Gastroenterology;  Laterality: N/A;  . BIOPSY  09/01/2018   Procedure: BIOPSY;  Surgeon: Shannon Juniper, MD;  Location: [facility] ENDOSCOPY;  Service: Gastroenterology;;  . Lum Keas INJECTION  09/01/2018   Procedure: BOTOX INJECTION;  Surgeon: Shannon Juniper, MD;  Location: [place];  Service: Gastroenterology;;  . BREAST SURGERY     breast biopsy  . CHOLECYSTECTOMY    . ESOPHAGEAL MANOMETRY N/A 10/07/2018   Procedure: ESOPHAGEAL MANOMETRY (EM);  Surgeon: Shannon Juniper, MD;  Location: [facility] ENDOSCOPY;  Service: Gastroenterology;  Laterality: N/A;  . ESOPHAGOGASTRODUODENOSCOPY (EGD) WITH PROPOFOL N/A 09/01/2018   Procedure: ESOPHAGOGASTRODUODENOSCOPY (EGD) WITH PROPOFOL;  Surgeon: Shannon Juniper, MD;  Location: [place];  Service: Gastroenterology;  Laterality: N/A;    Social History   Socioeconomic History  . Marital status: Widowed    Spouse name: Not on file  . Number of children: Not on file  . Years of education: Not on file  . Highest education level: Not on file  Occupational History  . Not on file  Social Needs  . Financial resource strain: Not on file  . Food insecurity:    Worry: Not on file    Inability: Not on file  . Transportation needs:    Medical: Not on file    Non-medical: Not on  file  Tobacco Use  . Smoking status: Never Smoker  . Smokeless tobacco: Never Used  Substance and Sexual Activity  . Alcohol use: No  . Drug use: No  . Sexual activity: Never  Lifestyle  . Physical activity:    Days per week: Not on file    Minutes per session: Not on file  . Stress: Not on file  Relationships  . Social connections:    Talks on phone: Not on file    Gets together: Not on file    Attends religious service: Not on file    Active member of club or organization: Not on file    Attends meetings of  clubs or organizations: Not on file    Relationship status: Not on file  . Intimate partner violence:    Fear of current or ex partner: Not on file    Emotionally abused: Not on file    Physically abused: Not on file    Forced sexual activity: Not on file  Other Topics Concern  . Not on file  Social History Narrative  . Not on file    Allergies  Allergen Reactions  . Elavil [Amitriptyline Hcl] Hives  . Ketek [Telithromycin] Hives    Family History  Problem Relation Age of Onset  . Hypertension Mother   . Diabetes Mother        non-insulin dependant  . CAD Brother     Prior to Admission medications   Medication Sig Start Date End Date Taking? Authorizing Provider  amLODipine (NORVASC) 5 MG tablet Take 5 mg by mouth daily.   Yes [provider]  feeding supplement, ENSURE ENLIVE, (ENSURE ENLIVE) LIQD Take 237 mLs by mouth 3 (three) times daily between meals. Patient taking differently: Take 237 mLs by mouth 2 (two) times daily between meals.  09/02/18  Yes Ortiz, Shannon N, DO  losartan-hydrochlorothiazide (HYZAAR) 100-25 MG per tablet Take 1 tablet by mouth daily.   Yes [provider]  Multiple Vitamin (MULTIVITAMIN WITH MINERALS) TABS tablet Take 1 tablet by mouth daily. Patient taking differently: Take 2 tablets by mouth daily.  09/02/18  Yes Ortiz, Shannon N, DO  pantoprazole (PROTONIX) 40 MG tablet Take 40 mg by mouth daily.   Yes [provider]  polyethylene glycol (MIRALAX / GLYCOLAX) packet Take 17 g by mouth daily. Patient not taking: Reported on 10/15/2018 09/02/18   Kayleen Memos, DO  senna-docusate (SENOKOT-S) 8.6-50 MG tablet Take 2 tablets by mouth at bedtime as needed for mild constipation. Patient not taking: Reported on 10/15/2018 09/02/18   Kayleen Memos, DO    Physical Exam: Vitals:   10/15/18 1545 10/15/18 1600 10/15/18 1610 10/15/18 1648  BP: 137/75 129/62  (!) 143/77  Pulse: 94 85 92 91  Resp: 15 16 (!) 22 16  Temp:    98 F (36.7  C)  TempSrc:    Oral  SpO2: 100% 100%  100%  Weight:    76 kg  Height:    5\' 7"  (1.702 m)     . General:  Appears calm and comfortable and is NAD . Eyes:  PERRL, EOMI, normal lids, iris . ENT:  grossly normal hearing, lips & tongue, somewhat dry mm; artificial dentition . Neck:  no LAD, masses or thyromegaly . Cardiovascular:  RRR, no r/g. No LE edema.  Marland Kitchen Respiratory:   CTA bilaterally with no wheezes/rales/rhonchi.  Normal respiratory effort. . Abdomen:  soft, NT, ND, NABS . Skin:  no rash or induration  seen on limited exam . Musculoskeletal:  grossly normal tone BUE/BLE, good ROM, no bony abnormality . Psychiatric:  grossly normal mood and affect, speech fluent and appropriate, AOx3 . Neurologic:  CN 2-12 grossly intact, moves all extremities in coordinated fashion, sensation intact    Radiological Exams on Admission: Dg Chest Portable 1 View  Result Date: 10/15/2018 CLINICAL DATA:  I had miss, palpitations, no dysphagia EXAM: PORTABLE CHEST 1 VIEW COMPARISON:  02/16/2014 FINDINGS: The heart size and mediastinal contours are within normal limits. Both lungs are clear. The visualized skeletal structures are unremarkable. IMPRESSION: No active disease. Electronically Signed   By: Kathreen Devoid   On: 10/15/2018 13:47    EKG: Independently reviewed.  Wide complex tachycardia with rate 142; nonspecific ST changes that are likely rate-related    Labs on Admission: I have personally reviewed the available labs and imaging studies at the time of the admission.  Pertinent labs:   K+ 2.5 Glucose 147 BUN 18/Creatinine 1.91/GFR 28; 20/1.18/50 on 3/20 Mg++ 1.6 Troponin 0.05 WBC 6.6 Hgb 11.2; 9.1 on 3/20   Assessment/Plan Principal Problem:   Acquired diverticulum of distal esophagus with dysphagia Active Problems:   HTN (hypertension)   Hypokalemia   AKI (acute kidney injury) ([place])   Esophageal stenosis/diverticulum -Patient recently hospitalized for similar issue; EGD on  3/19 with 4 cm esophageal diverticulum and dilation/botox done -She did not show significant improvement and is now returning with inability to effectively maintain hydration as an outpatient; will admit to inpatient status given outpatient treatment failure and renal failure -This is failure of outpatient therapy and will likely require surgical repair sooner than scheduled 5/12 -Surgery has been consulted to see patient, although the surgery is unlikely to take place prior to early next week -Will continue Protonix, although this is unlikely to provide significant benefit in this circumstance -Clear liquids, advance diet as tolerated for now -Nutrition consult  Constipation -She also complains of constipation, will treat  AKI -Hold ACE/HCTZ -Likely associated with inadequate PO hydration -LR at 100 cc/hr following boluses  HTN -Continue Norvasc -Hold Losartan-HCTZ  Hypokalemia -Repleted in ER with 30 mEq IV KCl and 40 mEq  -Will follow.   -Will check Mag level in AM, as this was also low and repleted in the ER.  DVT prophylaxis:  Lovenox  Code Status:  Full - confirmed with patient Family Communication: None present Disposition Plan:  Home once clinically improved Consults called: surgery; nutrition  Admission status: Admit - It is my clinical opinion that admission to INPATIENT is reasonable and necessary because of the expectation that this patient will require hospital care that crosses at least 2 midnights to treat this condition based on the medical complexity of the problems presented.  Given the aforementioned information, the predictability of an adverse outcome is felt to be significant.    Karmen Bongo MD [facility]   How to contact the [facility] Attending or Consulting provider [place] or covering provider during after hours [place], for this patient?  1. Check the care team in [facility] and look for a) attending/consulting [facility] provider listed and b) the [facility] team  listed 2. Log into [email or web address] and use [facility]'s universal password to access. If you do not have the password, please contact the hospital operator. 3. Locate the [facility] provider you are looking for under [facility] and page to a number that you can be directly reached. 4. If you still have difficulty reaching the provider, please page the  DOC (Director on Call) for the Hospitalists listed on amion for assistance.   10/15/2018, 5:27 PM

## 2018-10-15 NOTE — H&P (Addendum)
CC: Consult by Dr. Ellender Hose for esophageal stricture, PO intolerance  HPI: Shannon Ortiz is an 83 y.o. female known to our practice - seeing Dr. Johney Maine. She has a hx of progressive solid food dysphagia with distal esophageal stricture/Schatzki's ring and esophageal diverticulum. She reports hx of dilations as well as botox with subsequent recurrence of her symptoms. She has been evaluated by Dr. Johney Maine whom is planning robotic diverticulectomy with myotomy and wrap in ~2 weeks. She came to ED today complaining of ongoing PO intolerance to soft/solid foods. Meats and potatoes have given her issues. She reports no issues with keeping down liquids but has to go slow. She reports soliid foods get stuck and eventually regurgitated. She denies any chest or abdominal pain. She denies fever/chills. She was admitted to [facility] and we were asked to see.  Past Medical History:  Diagnosis Date  . Acquired diverticulum of distal esophagus with dysphagia 10/03/2018  . AKI (acute kidney injury) ([place]) 08/31/2018  . Anxiety   . Bradycardia 02/16/2014  . Chest tightness   . DDD (degenerative disc disease), lumbar   . DJD (degenerative joint disease)   . Dry eyes   . Esophageal stenosis 08/31/2018  . Facial pain   . Fibromyalgia   . Hemorrhoid   . Hypertension   . Hypokalemia 08/31/2018  . Hypothyroidism   . Lightheadedness   . Near syncope 02/16/2014  . Osteopenia   . Sigmoid diverticulosis   . Thyroid cancer ([place])   . Thyroid nodule     Past Surgical History:  Procedure Laterality Date  . BALLOON DILATION N/A 09/01/2018   Procedure: BALLOON DILATION;  Surgeon: Ronnette Juniper, MD;  Location: [place];  Service: Gastroenterology;  Laterality: N/A;  . BIOPSY  09/01/2018   Procedure: BIOPSY;  Surgeon: Ronnette Juniper, MD;  Location: [facility] ENDOSCOPY;  Service: Gastroenterology;;  . Lum Keas INJECTION  09/01/2018   Procedure: BOTOX INJECTION;  Surgeon: Ronnette Juniper, MD;  Location: [place];  Service: Gastroenterology;;  .  BREAST SURGERY     breast biopsy  . CHOLECYSTECTOMY    . ESOPHAGEAL MANOMETRY N/A 10/07/2018   Procedure: ESOPHAGEAL MANOMETRY (EM);  Surgeon: Ronnette Juniper, MD;  Location: [facility] ENDOSCOPY;  Service: Gastroenterology;  Laterality: N/A;  . ESOPHAGOGASTRODUODENOSCOPY (EGD) WITH PROPOFOL N/A 09/01/2018   Procedure: ESOPHAGOGASTRODUODENOSCOPY (EGD) WITH PROPOFOL;  Surgeon: Ronnette Juniper, MD;  Location: [place];  Service: Gastroenterology;  Laterality: N/A;    Family History  Problem Relation Age of Onset  . Hypertension Mother   . Diabetes Mother        non-insulin dependant  . CAD Brother     Social:  reports that she has never smoked. She has never used smokeless tobacco. She reports that she does not drink alcohol or use drugs.  Allergies:  Allergies  Allergen Reactions  . Elavil [Amitriptyline Hcl] Hives  . Ketek [Telithromycin] Hives    Medications: I have reviewed the patient's current medications.  Results for orders placed or performed during the hospital encounter of 10/15/18 (from the past 48 hour(s))  CBG monitoring, ED     Status: Abnormal   Collection Time: 10/15/18  1:04 PM  Result Value Ref Range   Glucose-Capillary 135 (H) 70 - 99 mg/dL  CBC with Differential     Status: Abnormal   Collection Time: 10/15/18  1:25 PM  Result Value Ref Range   WBC 6.6 4.0 - 10.5 K/uL   RBC 4.76 3.87 - 5.11 MIL/uL   Hemoglobin 11.2 (L) 12.0 - 15.0 g/dL  HCT 34.1 (L) 36.0 - 46.0 %   MCV 71.6 (L) 80.0 - 100.0 fL   MCH 23.5 (L) 26.0 - 34.0 pg   MCHC 32.8 30.0 - 36.0 g/dL   RDW 16.2 (H) 11.5 - 15.5 %   Platelets 307 150 - 400 K/uL   nRBC 0.0 0.0 - 0.2 %   Neutrophils Relative % 53 %   Neutro Abs 3.4 1.7 - 7.7 K/uL   Lymphocytes Relative 37 %   Lymphs Abs 2.4 0.7 - 4.0 K/uL   Monocytes Relative 10 %   Monocytes Absolute 0.7 0.1 - 1.0 K/uL   Eosinophils Relative 0 %   Eosinophils Absolute 0.0 0.0 - 0.5 K/uL   Basophils Relative 0 %   Basophils Absolute 0.0 0.0 - 0.1 K/uL    Immature Granulocytes 0 %   Abs Immature Granulocytes 0.02 0.00 - 0.07 K/uL    Comment: Performed at [facility] [address] [place], [place] [address]  Comprehensive metabolic panel     Status: Abnormal   Collection Time: 10/15/18  1:25 PM  Result Value Ref Range   Sodium 136 135 - 145 mmol/L   Potassium 2.5 (LL) 3.5 - 5.1 mmol/L    Comment: CRITICAL RESULT CALLED TO, READ BACK BY AND VERIFIED WITH: T HAMILTON,RN AT 1429 10/15/2018 BY L BENFIELD    Chloride 95 (L) 98 - 111 mmol/L   CO2 24 22 - 32 mmol/L   Glucose, Bld 147 (H) 70 - 99 mg/dL   BUN 18 8 - 23 mg/dL   Creatinine, Ser 1.91 (H) 0.44 - 1.00 mg/dL   Calcium 10.6 (H) 8.9 - 10.3 mg/dL   Total Protein 7.5 6.5 - 8.1 g/dL   Albumin 4.2 3.5 - 5.0 g/dL   AST 27 15 - 41 U/L   ALT 14 0 - 44 U/L   Alkaline Phosphatase 45 38 - 126 U/L   Total Bilirubin 1.3 (H) 0.3 - 1.2 mg/dL   GFR calc non Af Amer 24 (L) >60 mL/min   GFR calc Af Amer 28 (L) >60 mL/min   Anion gap 17 (H) 5 - 15    Comment: Performed at [facility], [place] [address] [place], [place] [address]  Magnesium     Status: Abnormal   Collection Time: 10/15/18  1:25 PM  Result Value Ref Range   Magnesium 1.6 (L) 1.7 - 2.4 mg/dL    Comment: Performed at [facility] [address] [place], [place] [address]  Troponin I - ONCE - STAT     Status: Abnormal   Collection Time: 10/15/18  1:25 PM  Result Value Ref Range   Troponin I 0.05 (HH) <0.03 ng/mL    Comment: CRITICAL RESULT CALLED TO, READ BACK BY AND VERIFIED WITH: T HAMILTON,RN AT 1429 10/15/2018 BY L BENFIELD Performed at [facility], [place] [address], [place], [place] [address]   SARS Coronavirus 2 (CEPHEID - Performed in [facility] hospital lab), Hosp Order     Status: None   Collection Time: 10/15/18  1:39 PM  Result Value Ref Range   SARS Coronavirus 2 NEGATIVE NEGATIVE    Comment: (NOTE) If result is NEGATIVE SARS-CoV-2 target nucleic acids are NOT DETECTED. The SARS-CoV-2 RNA is  generally detectable in upper and lower  respiratory specimens during the acute phase of infection. The lowest  concentration of SARS-CoV-2 viral copies this assay can detect is 250  copies / mL. A negative result does not  preclude SARS-CoV-2 infection  and should not be used as the sole basis for treatment or other  patient management decisions.  A negative result may occur with  improper specimen collection / handling, submission of specimen other  than nasopharyngeal swab, presence of viral mutation(s) within the  areas targeted by this assay, and inadequate number of viral copies  (<250 copies / mL). A negative result must be combined with clinical  observations, patient history, and epidemiological information. If result is POSITIVE SARS-CoV-2 target nucleic acids are DETECTED. The SARS-CoV-2 RNA is generally detectable in upper and lower  respiratory specimens dur ing the acute phase of infection.  Positive  results are indicative of active infection with SARS-CoV-2.  Clinical  correlation with patient history and other diagnostic information is  necessary to determine patient infection status.  Positive results do  not rule out bacterial infection or co-infection with other viruses. If result is PRESUMPTIVE POSTIVE SARS-CoV-2 nucleic acids MAY BE PRESENT.   A presumptive positive result was obtained on the submitted specimen  and confirmed on repeat testing.  While 2019 novel coronavirus  (SARS-CoV-2) nucleic acids may be present in the submitted sample  additional confirmatory testing may be necessary for epidemiological  and / or clinical management purposes  to differentiate between  SARS-CoV-2 and other Sarbecovirus currently known to infect humans.  If clinically indicated additional testing with an alternate test  methodology [phone]) is advised. The SARS-CoV-2 RNA is generally  detectable in upper and lower respiratory sp ecimens during the acute  phase of infection.  The expected result is Negative. Fact Sheet for Patients:  StrictlyIdeas.no Fact Sheet for Healthcare Providers: BankingDealers.co.za This test is not yet approved or cleared by the [place] FDA and has been authorized for detection and/or diagnosis of SARS-CoV-2 by FDA under an Emergency Use Authorization (EUA).  This EUA will remain in effect (meaning this test can be used) for the duration of the COVID-19 declaration under Section 564(b)(1) of the Act, 21 U.S.C. section 360bbb-3(b)(1), unless the authorization is terminated or revoked sooner. Performed at [facility], [place] [address] [place], [place] [address]     Dg Chest Portable 1 View  Result Date: 10/15/2018 CLINICAL DATA:  I had miss, palpitations, no dysphagia EXAM: PORTABLE CHEST 1 VIEW COMPARISON:  02/16/2014 FINDINGS: The heart size and mediastinal contours are within normal limits. Both lungs are clear. The visualized skeletal structures are unremarkable. IMPRESSION: No active disease. Electronically Signed   By: Kathreen Devoid   On: 10/15/2018 13:47    ROS - all of the below systems have been reviewed with the patient and positives are indicated with bold text General: chills, fever or night sweats Eyes: blurry vision or double vision ENT: epistaxis or sore throat Allergy/Immunology: itchy/watery eyes or nasal congestion Hematologic/Lymphatic: bleeding problems, blood clots or swollen lymph nodes Endocrine: temperature intolerance or unexpected weight changes Breast: new or changing breast lumps or nipple discharge Resp: cough, shortness of breath, or wheezing CV: chest pain or dyspnea on exertion GI: as per HPI GU: dysuria, trouble voiding, or hematuria MSK: joint pain or joint stiffness Neuro: TIA or stroke symptoms Derm: pruritus and skin lesion changes Psych: anxiety and depression  PE Blood pressure (!) 143/77, pulse 91, temperature 98 F (36.7 C),  temperature source Oral, resp. rate 16, height 5\' 7"  (1.702 m), weight 76 kg, SpO2 100 %. Constitutional: NAD; conversant; no deformities Eyes: Moist conjunctiva; no lid lag; anicteric; PERRL Neck: Trachea midline; no thyromegaly Lungs:  Normal respiratory effort; no tactile fremitus CV: RRR; no palpable thrills; no pitting edema GI: Abd soft, NT/ND; no palpable hepatosplenomegaly MSK: Normal gait; no clubbing/cyanosis Psychiatric: Appropriate affect; alert and oriented x3 Lymphatic: No palpable cervical or axillary lymphadenopathy  Results for orders placed or performed during the hospital encounter of 10/15/18 (from the past 48 hour(s))  CBG monitoring, ED     Status: Abnormal   Collection Time: 10/15/18  1:04 PM  Result Value Ref Range   Glucose-Capillary 135 (H) 70 - 99 mg/dL  CBC with Differential     Status: Abnormal   Collection Time: 10/15/18  1:25 PM  Result Value Ref Range   WBC 6.6 4.0 - 10.5 K/uL   RBC 4.76 3.87 - 5.11 MIL/uL   Hemoglobin 11.2 (L) 12.0 - 15.0 g/dL   HCT 34.1 (L) 36.0 - 46.0 %   MCV 71.6 (L) 80.0 - 100.0 fL   MCH 23.5 (L) 26.0 - 34.0 pg   MCHC 32.8 30.0 - 36.0 g/dL   RDW 16.2 (H) 11.5 - 15.5 %   Platelets 307 150 - 400 K/uL   nRBC 0.0 0.0 - 0.2 %   Neutrophils Relative % 53 %   Neutro Abs 3.4 1.7 - 7.7 K/uL   Lymphocytes Relative 37 %   Lymphs Abs 2.4 0.7 - 4.0 K/uL   Monocytes Relative 10 %   Monocytes Absolute 0.7 0.1 - 1.0 K/uL   Eosinophils Relative 0 %   Eosinophils Absolute 0.0 0.0 - 0.5 K/uL   Basophils Relative 0 %   Basophils Absolute 0.0 0.0 - 0.1 K/uL   Immature Granulocytes 0 %   Abs Immature Granulocytes 0.02 0.00 - 0.07 K/uL    Comment: Performed at [facility], [address] [place], [place] [address]  Comprehensive metabolic panel     Status: Abnormal   Collection Time: 10/15/18  1:25 PM  Result Value Ref Range   Sodium 136 135 - 145 mmol/L   Potassium 2.5 (LL) 3.5 - 5.1 mmol/L    Comment: CRITICAL RESULT CALLED TO,  READ BACK BY AND VERIFIED WITH: T HAMILTON,RN AT 1429 10/15/2018 BY L BENFIELD    Chloride 95 (L) 98 - 111 mmol/L   CO2 24 22 - 32 mmol/L   Glucose, Bld 147 (H) 70 - 99 mg/dL   BUN 18 8 - 23 mg/dL   Creatinine, Ser 1.91 (H) 0.44 - 1.00 mg/dL   Calcium 10.6 (H) 8.9 - 10.3 mg/dL   Total Protein 7.5 6.5 - 8.1 g/dL   Albumin 4.2 3.5 - 5.0 g/dL   AST 27 15 - 41 U/L   ALT 14 0 - 44 U/L   Alkaline Phosphatase 45 38 - 126 U/L   Total Bilirubin 1.3 (H) 0.3 - 1.2 mg/dL   GFR calc non Af Amer 24 (L) >60 mL/min   GFR calc Af Amer 28 (L) >60 mL/min   Anion gap 17 (H) 5 - 15    Comment: Performed at [facility], [place] [address] [place], [place] [address]  Magnesium     Status: Abnormal   Collection Time: 10/15/18  1:25 PM  Result Value Ref Range   Magnesium 1.6 (L) 1.7 - 2.4 mg/dL    Comment: Performed at [facility] [address] [place], [place] [address]  Troponin I - ONCE - STAT     Status: Abnormal   Collection Time: 10/15/18  1:25 PM  Result Value Ref Range   Troponin  I 0.05 (HH) <0.03 ng/mL    Comment: CRITICAL RESULT CALLED TO, READ BACK BY AND VERIFIED WITH: T HAMILTON,RN AT 1429 10/15/2018 BY L BENFIELD Performed at [facility], [place] [address], [place], [place] [address]   SARS Coronavirus 2 (CEPHEID - Performed in [facility] hospital lab), Hosp Order     Status: None   Collection Time: 10/15/18  1:39 PM  Result Value Ref Range   SARS Coronavirus 2 NEGATIVE NEGATIVE    Comment: (NOTE) If result is NEGATIVE SARS-CoV-2 target nucleic acids are NOT DETECTED. The SARS-CoV-2 RNA is generally detectable in upper and lower  respiratory specimens during the acute phase of infection. The lowest  concentration of SARS-CoV-2 viral copies this assay can detect is 250  copies / mL. A negative result does not preclude SARS-CoV-2 infection  and should not be used as the sole basis for treatment or other  patient management decisions.  A negative result may occur with   improper specimen collection / handling, submission of specimen other  than nasopharyngeal swab, presence of viral mutation(s) within the  areas targeted by this assay, and inadequate number of viral copies  (<250 copies / mL). A negative result must be combined with clinical  observations, patient history, and epidemiological information. If result is POSITIVE SARS-CoV-2 target nucleic acids are DETECTED. The SARS-CoV-2 RNA is generally detectable in upper and lower  respiratory specimens dur ing the acute phase of infection.  Positive  results are indicative of active infection with SARS-CoV-2.  Clinical  correlation with patient history and other diagnostic information is  necessary to determine patient infection status.  Positive results do  not rule out bacterial infection or co-infection with other viruses. If result is PRESUMPTIVE POSTIVE SARS-CoV-2 nucleic acids MAY BE PRESENT.   A presumptive positive result was obtained on the submitted specimen  and confirmed on repeat testing.  While 2019 novel coronavirus  (SARS-CoV-2) nucleic acids may be present in the submitted sample  additional confirmatory testing may be necessary for epidemiological  and / or clinical management purposes  to differentiate between  SARS-CoV-2 and other Sarbecovirus currently known to infect humans.  If clinically indicated additional testing with an alternate test  methodology [phone]) is advised. The SARS-CoV-2 RNA is generally  detectable in upper and lower respiratory sp ecimens during the acute  phase of infection. The expected result is Negative. Fact Sheet for Patients:  StrictlyIdeas.no Fact Sheet for Healthcare Providers: BankingDealers.co.za This test is not yet approved or cleared by the [place] FDA and has been authorized for detection and/or diagnosis of SARS-CoV-2 by FDA under an Emergency Use Authorization (EUA).  This EUA will  remain in effect (meaning this test can be used) for the duration of the COVID-19 declaration under Section 564(b)(1) of the Act, 21 U.S.C. section 360bbb-3(b)(1), unless the authorization is terminated or revoked sooner. Performed at [facility], [place] [address], [place], [place] [address]     Dg Chest Portable 1 View  Result Date: 10/15/2018 CLINICAL DATA:  I had miss, palpitations, no dysphagia EXAM: PORTABLE CHEST 1 VIEW COMPARISON:  02/16/2014 FINDINGS: The heart size and mediastinal contours are within normal limits. Both lungs are clear. The visualized skeletal structures are unremarkable. IMPRESSION: No active disease. Electronically Signed   By: Kathreen Devoid   On: 10/15/2018 13:47   A/P: Shannon Ortiz is an 83 y.o. female with distal esophageal diverticulum and Schatzki ring/distal eso stricture - has now undergone multiple dilations and  botox with transient improvement  -Last dilation + botox was 09/01/2018. She reports improvement that lasts 3-4 weeks and then symptoms begin to return. She was noted to have food within diverticulum at that time as well -Manometry just completed 4/24; no achalasia or DES -I have discussed the case with Dr. Johney Maine - clear liquids tonight; would plan for her to be on a full liquid diet + boost/protein shakes tomorrow if no problem with clears. Ultimately, pureed diet moving forward - consistency thin enough to drink with straw -Prealbumin ordered -Would order gastrografin esophagogram to ensure no food impaction -GI consult for possible repeat dilation  [name], M.D. [facility]

## 2018-10-15 NOTE — ED Triage Notes (Signed)
Pt came in with c/o dizziness and weakness x1 week.  No LOC noted.  IV placed and pt placed on cardiac monitor.  Resting with NAD at this time.

## 2018-10-15 NOTE — ED Notes (Signed)
ED TO INPATIENT HANDOFF REPORT  ED Nurse Name and Phone #: [phone]  S Name/Age/Gender Shannon Ortiz 83 y.o. female Room/Bed: 033C/033C  Code Status   Code Status: Prior  Home/SNF/Other Home Patient oriented to: self, place, time and situation Is this baseline? Yes   Triage Complete: Triage complete  Chief Complaint seeing spots/dehydration/low potassium   Triage Note Pt came in with c/o dizziness and weakness x1 week.  No LOC noted.  IV placed and pt placed on cardiac monitor.  Resting with NAD at this time.   Allergies Allergies  Allergen Reactions  . Elavil [Amitriptyline Hcl] Hives  . Ketek [Telithromycin] Hives    Level of Care/Admitting Diagnosis ED Disposition    ED Disposition Condition Comment   Admit  Hospital Area: [place] [[id number]]  Level of Care: Med-Surg [16]  Covid Evaluation: N/A  Diagnosis: Esophageal stricture [450388]  Admitting Physician: Karmen Bongo [[id number]]  Attending Physician: Karmen Bongo [[id number]]  Estimated length of stay: 3 - 4 days  Certification:: I certify this patient will need inpatient services for at least 2 midnights  PT Class (Do Not Modify): Inpatient [101]  PT Acc Code (Do Not Modify): Private [1]       B Medical/Surgery History Past Medical History:  Diagnosis Date  . Acquired diverticulum of distal esophagus with dysphagia 10/03/2018  . AKI (acute kidney injury) ([place]) 08/31/2018  . Anxiety   . Bradycardia 02/16/2014  . Chest tightness   . DDD (degenerative disc disease), lumbar   . DJD (degenerative joint disease)   . Dry eyes   . Esophageal stenosis 08/31/2018  . Facial pain   . Fibromyalgia   . Hemorrhoid   . Hypertension   . Hypokalemia 08/31/2018  . Hypothyroidism   . Lightheadedness   . Near syncope 02/16/2014  . Osteopenia   . Sigmoid diverticulosis   . Thyroid cancer ([place])   . Thyroid nodule    Past Surgical History:  Procedure Laterality Date  . BALLOON DILATION N/A 09/01/2018   Procedure: BALLOON DILATION;  Surgeon: Ronnette Juniper, MD;  Location: [place];  Service: Gastroenterology;  Laterality: N/A;  . BIOPSY  09/01/2018   Procedure: BIOPSY;  Surgeon: Ronnette Juniper, MD;  [facility];  Service: Gastroenterology;;  . Lum Keas INJECTION  09/01/2018   Procedure: BOTOX INJECTION;  Surgeon: Ronnette Juniper, MD;  Location: [place];  Service: Gastroenterology;;  . BREAST SURGERY     breast biopsy  . CHOLECYSTECTOMY    . ESOPHAGEAL MANOMETRY N/A 10/07/2018   Procedure: ESOPHAGEAL MANOMETRY (EM);  Surgeon: Ronnette Juniper, MD;  Location: [facility] ENDOSCOPY;  Service: Gastroenterology;  Laterality: N/A;  . ESOPHAGOGASTRODUODENOSCOPY (EGD) WITH PROPOFOL N/A 09/01/2018   Procedure: ESOPHAGOGASTRODUODENOSCOPY (EGD) WITH PROPOFOL;  Surgeon: Ronnette Juniper, MD;  Location: [place];  Service: Gastroenterology;  Laterality: N/A;     A IV Location/Drains/Wounds Patient Lines/Drains/Airways Status   Active Line/Drains/Airways    Name:   Placement date:   Placement time:   Site:   Days:   Peripheral IV 08/31/18 Right Antecubital   08/31/18    1620    Antecubital   45   Peripheral IV 10/15/18 Left Antecubital   10/15/18    1311    Antecubital   less than 1          Intake/Output Last 24 hours No intake or output data in the 24 hours ending 10/15/18 1600  Labs/Imaging Results for orders placed or performed during the hospital encounter of 10/15/18 (from the past 48  hour(s))  CBG monitoring, ED     Status: Abnormal   Collection Time: 10/15/18  1:04 PM  Result Value Ref Range   Glucose-Capillary 135 (H) 70 - 99 mg/dL  CBC with Differential     Status: Abnormal   Collection Time: 10/15/18  1:25 PM  Result Value Ref Range   WBC 6.6 4.0 - 10.5 K/uL   RBC 4.76 3.87 - 5.11 MIL/uL   Hemoglobin 11.2 (L) 12.0 - 15.0 g/dL   HCT 34.1 (L) 36.0 - 46.0 %   MCV 71.6 (L) 80.0 - 100.0 fL   MCH 23.5 (L) 26.0 - 34.0 pg   MCHC 32.8 30.0 - 36.0 g/dL   RDW 16.2 (H) 11.5 - 15.5 %   Platelets  307 150 - 400 K/uL   nRBC 0.0 0.0 - 0.2 %   Neutrophils Relative % 53 %   Neutro Abs 3.4 1.7 - 7.7 K/uL   Lymphocytes Relative 37 %   Lymphs Abs 2.4 0.7 - 4.0 K/uL   Monocytes Relative 10 %   Monocytes Absolute 0.7 0.1 - 1.0 K/uL   Eosinophils Relative 0 %   Eosinophils Absolute 0.0 0.0 - 0.5 K/uL   Basophils Relative 0 %   Basophils Absolute 0.0 0.0 - 0.1 K/uL   Immature Granulocytes 0 %   Abs Immature Granulocytes 0.02 0.00 - 0.07 K/uL    Comment: Performed at [facility], [address] [place], [place] [address]  Comprehensive metabolic panel     Status: Abnormal   Collection Time: 10/15/18  1:25 PM  Result Value Ref Range   Sodium 136 135 - 145 mmol/L   Potassium 2.5 (LL) 3.5 - 5.1 mmol/L    Comment: CRITICAL RESULT CALLED TO, READ BACK BY AND VERIFIED WITH: T HAMILTON,RN AT 1429 10/15/2018 BY L BENFIELD    Chloride 95 (L) 98 - 111 mmol/L   CO2 24 22 - 32 mmol/L   Glucose, Bld 147 (H) 70 - 99 mg/dL   BUN 18 8 - 23 mg/dL   Creatinine, Ser 1.91 (H) 0.44 - 1.00 mg/dL   Calcium 10.6 (H) 8.9 - 10.3 mg/dL   Total Protein 7.5 6.5 - 8.1 g/dL   Albumin 4.2 3.5 - 5.0 g/dL   AST 27 15 - 41 U/L   ALT 14 0 - 44 U/L   Alkaline Phosphatase 45 38 - 126 U/L   Total Bilirubin 1.3 (H) 0.3 - 1.2 mg/dL   GFR calc non Af Amer 24 (L) >60 mL/min   GFR calc Af Amer 28 (L) >60 mL/min   Anion gap 17 (H) 5 - 15    Comment: Performed at [facility], [place] [address] [place], [place] [address]  Magnesium     Status: Abnormal   Collection Time: 10/15/18  1:25 PM  Result Value Ref Range   Magnesium 1.6 (L) 1.7 - 2.4 mg/dL    Comment: Performed at [facility] [address] [place], [place] [address]  Troponin I - ONCE - STAT     Status: Abnormal   Collection Time: 10/15/18  1:25 PM  Result Value Ref Range   Troponin I 0.05 (HH) <0.03 ng/mL    Comment: CRITICAL RESULT CALLED TO, READ BACK BY AND VERIFIED WITH: T HAMILTON,RN AT 1429 10/15/2018 BY L BENFIELD Performed at [facility], [place] [address], [place], [place] [address]   SARS Coronavirus 2 (CEPHEID - Performed in [facility] hospital lab), [facility]  Status: None   Collection Time: 10/15/18  1:39 PM  Result Value Ref Range   SARS Coronavirus 2 NEGATIVE NEGATIVE    Comment: (NOTE) If result is NEGATIVE SARS-CoV-2 target nucleic acids are NOT DETECTED. The SARS-CoV-2 RNA is generally detectable in upper and lower  respiratory specimens during the acute phase of infection. The lowest  concentration of SARS-CoV-2 viral copies this assay can detect is 250  copies / mL. A negative result does not preclude SARS-CoV-2 infection  and should not be used as the sole basis for treatment or other  patient management decisions.  A negative result may occur with  improper specimen collection / handling, submission of specimen other  than nasopharyngeal swab, presence of viral mutation(s) within the  areas targeted by this assay, and inadequate number of viral copies  (<250 copies / mL). A negative result must be combined with clinical  observations, patient history, and epidemiological information. If result is POSITIVE SARS-CoV-2 target nucleic acids are DETECTED. The SARS-CoV-2 RNA is generally detectable in upper and lower  respiratory specimens dur ing the acute phase of infection.  Positive  results are indicative of active infection with SARS-CoV-2.  Clinical  correlation with patient history and other diagnostic information is  necessary to determine patient infection status.  Positive results do  not rule out bacterial infection or co-infection with other viruses. If result is PRESUMPTIVE POSTIVE SARS-CoV-2 nucleic acids MAY BE PRESENT.   A presumptive positive result was obtained on the submitted specimen  and confirmed on repeat testing.  While 2019 novel coronavirus  (SARS-CoV-2) nucleic acids may be present in the submitted sample  additional confirmatory testing may be necessary for  epidemiological  and / or clinical management purposes  to differentiate between  SARS-CoV-2 and other Sarbecovirus currently known to infect humans.  If clinically indicated additional testing with an alternate test  methodology [phone]) is advised. The SARS-CoV-2 RNA is generally  detectable in upper and lower respiratory sp ecimens during the acute  phase of infection. The expected result is Negative. Fact Sheet for Patients:  StrictlyIdeas.no Fact Sheet for Healthcare Providers: BankingDealers.co.za This test is not yet approved or cleared by the [place] FDA and has been authorized for detection and/or diagnosis of SARS-CoV-2 by FDA under an Emergency Use Authorization (EUA).  This EUA will remain in effect (meaning this test can be used) for the duration of the COVID-19 declaration under Section 564(b)(1) of the Act, 21 U.S.C. section 360bbb-3(b)(1), unless the authorization is terminated or revoked sooner. Performed at [facility], [place] [address] [place], [place] [address]    Dg Chest Portable 1 View  Result Date: 10/15/2018 CLINICAL DATA:  I had miss, palpitations, no dysphagia EXAM: PORTABLE CHEST 1 VIEW COMPARISON:  02/16/2014 FINDINGS: The heart size and mediastinal contours are within normal limits. Both lungs are clear. The visualized skeletal structures are unremarkable. IMPRESSION: No active disease. Electronically Signed   By: Kathreen Devoid   On: 10/15/2018 13:47    Pending Labs Unresulted Labs (From admission, onward)   None      Vitals/Pain Today's Vitals   10/15/18 1500 10/15/18 1515 10/15/18 1530 10/15/18 1545  BP: 130/69 110/69 124/64 137/75  Pulse: 84 81 87 94  Resp: 19 12 15 15   Temp:      TempSrc:      SpO2: 100% 99% 100% 100%  Weight:      Height:      PainSc:  Isolation Precautions No active isolations  Medications Medications  0.9 %  sodium chloride infusion (has no  administration in time range)  lactated ringers bolus 1,000 mL (has no administration in time range)  magnesium sulfate IVPB 2 g 50 mL (has no administration in time range)  potassium chloride SA (K-DUR) CR tablet 40 mEq (has no administration in time range)  potassium chloride 10 mEq in 100 mL IVPB (has no administration in time range)  sodium chloride 0.9 % bolus 1,000 mL (1,000 mLs Intravenous New Bag/Given 10/15/18 1321)    Mobility walks with device Low fall risk   Focused Assessments Cardiac Assessment Handoff:  Cardiac Rhythm: Sinus tachycardia Lab Results  Component Value Date   TROPONINI 0.05 ([place]) 10/15/2018   No results found for: DDIMER Does the Patient currently have chest pain? No     R Recommendations: See Admitting Provider Note  Report given to:   Additional Notes: .

## 2018-10-15 NOTE — ED Provider Notes (Signed)
[facility] EMERGENCY DEPARTMENT Provider Note   CSN: [id number] Arrival date & time: 10/15/18  1243    History   Chief Complaint Chief Complaint  Patient presents with  . Dizziness  . Weakness    HPI Shannon Ortiz is a 83 y.o. female.     HPI   83 yo F with PMHx as below most pertinent for h/o esophageal stricture and diverticulum here w/ dizziness, fatigue, lightheadedness, difficulty tolerating PO. Pt has a known stricture and is scheduled for surgery w/ Dr. Johney Maine on 5/12. She's been on a soft diet for the last few weeks and admits to poor PO intake and loss of appetite, both 2/2 dysphagia as well as dietary restrictions. Over the last week, she's had less PO intake and has subsequently felt weak, fatigued, and is now having lightheadedness and "seeing spots" when going from sitting to standing. Sx improve w/ rest and lying flat. Denies any abdominal ro chest pain. No palpitations, SOB. No fever or chills. No other complaints. No cough or known aspiration events.  Past Medical History:  Diagnosis Date  . Acquired diverticulum of distal esophagus with dysphagia 10/03/2018  . AKI (acute kidney injury) ([place]) 08/31/2018  . Anxiety   . Bradycardia 02/16/2014  . Chest tightness   . DDD (degenerative disc disease), lumbar   . DJD (degenerative joint disease)   . Dry eyes   . Esophageal stenosis 08/31/2018  . Facial pain   . Fibromyalgia   . Hemorrhoid   . Hypertension   . Hypokalemia 08/31/2018  . Hypothyroidism   . Lightheadedness   . Near syncope 02/16/2014  . Osteopenia   . Sigmoid diverticulosis   . Thyroid cancer ([place])   . Thyroid nodule     Patient Active Problem List   Diagnosis Date Noted  . Acquired diverticulum of distal esophagus with dysphagia 10/03/2018  . Esophageal stenosis 08/31/2018  . Hypokalemia 08/31/2018  . AKI (acute kidney injury) ([place]) 08/31/2018  . Bradycardia 02/16/2014  . Near syncope 02/16/2014  . HTN (hypertension) 02/16/2014     Past Surgical History:  Procedure Laterality Date  . BALLOON DILATION N/A 09/01/2018   Procedure: BALLOON DILATION;  Surgeon: Ronnette Juniper, MD;  Location: [place];  Service: Gastroenterology;  Laterality: N/A;  . BIOPSY  09/01/2018   Procedure: BIOPSY;  Surgeon: Ronnette Juniper, MD;  Location: [facility] ENDOSCOPY;  Service: Gastroenterology;;  . Lum Keas INJECTION  09/01/2018   Procedure: BOTOX INJECTION;  Surgeon: Ronnette Juniper, MD;  Location: [place];  Service: Gastroenterology;;  . BREAST SURGERY     breast biopsy  . CHOLECYSTECTOMY    . ESOPHAGEAL MANOMETRY N/A 10/07/2018   Procedure: ESOPHAGEAL MANOMETRY (EM);  Surgeon: Ronnette Juniper, MD;  Location: [facility] ENDOSCOPY;  Service: Gastroenterology;  Laterality: N/A;  . ESOPHAGOGASTRODUODENOSCOPY (EGD) WITH PROPOFOL N/A 09/01/2018   Procedure: ESOPHAGOGASTRODUODENOSCOPY (EGD) WITH PROPOFOL;  Surgeon: Ronnette Juniper, MD;  Location: [place];  Service: Gastroenterology;  Laterality: N/A;     OB History   No obstetric history on file.      Home Medications    Prior to Admission medications   Medication Sig Start Date End Date Taking? Authorizing Provider  amLODipine (NORVASC) 5 MG tablet Take 5 mg by mouth daily.   Yes [provider]  feeding supplement, ENSURE ENLIVE, (ENSURE ENLIVE) LIQD Take 237 mLs by mouth 3 (three) times daily between meals. Patient taking differently: Take 237 mLs by mouth 2 (two) times daily between meals.  09/02/18  Yes Irene Pap  N, DO  losartan-hydrochlorothiazide (HYZAAR) 100-25 MG per tablet Take 1 tablet by mouth daily.   Yes [provider]  Multiple Vitamin (MULTIVITAMIN WITH MINERALS) TABS tablet Take 1 tablet by mouth daily. Patient taking differently: Take 2 tablets by mouth daily.  09/02/18  Yes Hall, Carole N, DO  pantoprazole (PROTONIX) 40 MG tablet Take 40 mg by mouth daily.   Yes [provider]  polyethylene glycol (MIRALAX / GLYCOLAX) packet Take 17 g by mouth daily. Patient  not taking: Reported on 10/15/2018 09/02/18   Kayleen Memos, DO  senna-docusate (SENOKOT-S) 8.6-50 MG tablet Take 2 tablets by mouth at bedtime as needed for mild constipation. Patient not taking: Reported on 10/15/2018 09/02/18   Kayleen Memos, DO    Family History Family History  Problem Relation Age of Onset  . Hypertension Mother   . Diabetes Mother        non-insulin dependant  . CAD Brother     Social History Social History   Tobacco Use  . Smoking status: Never Smoker  . Smokeless tobacco: Never Used  Substance Use Topics  . Alcohol use: No  . Drug use: No     Allergies   Elavil [amitriptyline hcl] and Ketek [telithromycin]   Review of Systems Review of Systems  Constitutional: Positive for fatigue. Negative for chills and fever.  HENT: Negative for congestion and rhinorrhea.   Eyes: Negative for visual disturbance.  Respiratory: Negative for cough, shortness of breath and wheezing.   Cardiovascular: Negative for chest pain and leg swelling.  Gastrointestinal: Negative for abdominal pain, diarrhea, nausea and vomiting.  Genitourinary: Negative for dysuria and flank pain.  Musculoskeletal: Negative for neck pain and neck stiffness.  Skin: Negative for rash and wound.  Allergic/Immunologic: Negative for immunocompromised state.  Neurological: Positive for dizziness and light-headedness. Negative for syncope, weakness and headaches.  All other systems reviewed and are negative.    Physical Exam Updated Vital Signs BP (!) 159/61 (BP Location: Right Arm)   Pulse (!) 130   Temp 98.3 F (36.8 C) (Oral)   Resp (!) 23   Ht 5\' 7"  (1.702 m)   Wt 76.2 kg   SpO2 100%   BMI 26.31 kg/m   Physical Exam Vitals signs and nursing note reviewed.  Constitutional:      General: She is not in acute distress.    Appearance: She is well-developed.  HENT:     Head: Normocephalic and atraumatic.     Mouth/Throat:     Mouth: Mucous membranes are dry.  Eyes:      Conjunctiva/sclera: Conjunctivae normal.  Neck:     Musculoskeletal: Neck supple.  Cardiovascular:     Rate and Rhythm: Normal rate. Rhythm irregularly irregular.     Heart sounds: Normal heart sounds. No murmur. No friction rub.  Pulmonary:     Effort: Pulmonary effort is normal. No respiratory distress.     Breath sounds: Normal breath sounds. No wheezing or rales.  Abdominal:     General: There is no distension.     Palpations: Abdomen is soft.     Tenderness: There is no abdominal tenderness.  Skin:    General: Skin is warm.     Capillary Refill: Capillary refill takes less than 2 seconds.  Neurological:     Mental Status: She is alert and oriented to person, place, and time.     Motor: No abnormal muscle tone.      ED Treatments / Results  Labs (all  labs ordered are listed, but only abnormal results are displayed) Labs Reviewed  CBC WITH DIFFERENTIAL/PLATELET - Abnormal; Notable for the following components:      Result Value   Hemoglobin 11.2 (*)    HCT 34.1 (*)    MCV 71.6 (*)    MCH 23.5 (*)    RDW 16.2 (*)    All other components within normal limits  COMPREHENSIVE METABOLIC PANEL - Abnormal; Notable for the following components:   Potassium 2.5 (*)    Chloride 95 (*)    Glucose, Bld 147 (*)    Creatinine, Ser 1.91 (*)    Calcium 10.6 (*)    Total Bilirubin 1.3 (*)    GFR calc non Af Amer 24 (*)    GFR calc Af Amer 28 (*)    Anion gap 17 (*)    All other components within normal limits  MAGNESIUM - Abnormal; Notable for the following components:   Magnesium 1.6 (*)    All other components within normal limits  TROPONIN I - Abnormal; Notable for the following components:   Troponin I 0.05 (*)    All other components within normal limits  CBG MONITORING, ED - Abnormal; Notable for the following components:   Glucose-Capillary 135 (*)    All other components within normal limits  SARS CORONAVIRUS 2 (HOSPITAL ORDER, [facility])     EKG EKG Interpretation  Date/Time:  Saturday Oct 15 2018 13:00:19 EDT Ventricular Rate:  142 PR Interval:    QRS Duration: 125 QT Interval:  318 QTC Calculation: 484 R Axis:   -61 Text Interpretation:  Wide-QRS tachycardia Paired ventricular premature complexes Nonspecific IVCD with LAD Since last EKG, rate is significantly increased pACs are now evident Suspect EAT Confirmed by Duffy Bruce [id number]) on 10/15/2018 1:47:00 PM   Radiology Dg Chest Portable 1 View  Result Date: 10/15/2018 CLINICAL DATA:  I had miss, palpitations, no dysphagia EXAM: PORTABLE CHEST 1 VIEW COMPARISON:  02/16/2014 FINDINGS: The heart size and mediastinal contours are within normal limits. Both lungs are clear. The visualized skeletal structures are unremarkable. IMPRESSION: No active disease. Electronically Signed   By: Kathreen Devoid   On: 10/15/2018 13:47    Procedures Procedures (including critical care time)  Medications Ordered in ED Medications  0.9 %  sodium chloride infusion (has no administration in time range)  lactated ringers bolus 1,000 mL (has no administration in time range)  magnesium sulfate IVPB 2 g 50 mL (has no administration in time range)  potassium chloride SA (K-DUR) CR tablet 40 mEq (has no administration in time range)  potassium chloride 10 mEq in 100 mL IVPB (has no administration in time range)  sodium chloride 0.9 % bolus 1,000 mL (1,000 mLs Intravenous New Bag/Given 10/15/18 1321)     Initial Impression / Assessment and Plan / ED Course  I have reviewed the triage vital signs and the nursing notes.  Pertinent labs & imaging results that were available during my care of the patient were reviewed by me and considered in my medical decision making (see chart for details).        83 yo F here with nausea, vomiting, weakness. Suspect acute on chronic weakness, dehydration 2/2 her known esophageal stricture. Labs show AKI, hypoK and hyponmag. Will replace, admit to Medicine.  D/w Dr. Dema Severin of [facility] per Hospitalist, who will review pt's notes. Suspect she may need more urgent surgery with Dr. Johney Maine, versus discussion w/ GI re: dilatation though  she just had this on 4/24.  Of note, on arrival EKG c/f EAT. It was not c/w AFib. Resolved with fluids.  Final Clinical Impressions(s) / ED Diagnoses   Final diagnoses:  Dehydration  Hypokalemia  Hypomagnesemia    ED Discharge Orders    None       Duffy Bruce, MD 10/15/18 1517

## 2018-10-16 ENCOUNTER — Other Ambulatory Visit: Payer: Self-pay

## 2018-10-16 DIAGNOSIS — I10 Essential (primary) hypertension: Secondary | ICD-10-CM

## 2018-10-16 DIAGNOSIS — E876 Hypokalemia: Secondary | ICD-10-CM

## 2018-10-16 DIAGNOSIS — N179 Acute kidney failure, unspecified: Secondary | ICD-10-CM

## 2018-10-16 DIAGNOSIS — R634 Abnormal weight loss: Secondary | ICD-10-CM

## 2018-10-16 LAB — CBC
HCT: 24.7 % — ABNORMAL LOW (ref 36.0–46.0)
Hemoglobin: 8.3 g/dL — ABNORMAL LOW (ref 12.0–15.0)
MCH: 23.2 pg — ABNORMAL LOW (ref 26.0–34.0)
MCHC: 33.6 g/dL (ref 30.0–36.0)
MCV: 69.2 fL — ABNORMAL LOW (ref 80.0–100.0)
Platelets: 254 10*3/uL (ref 150–400)
RBC: 3.57 MIL/uL — ABNORMAL LOW (ref 3.87–5.11)
RDW: 15.9 % — ABNORMAL HIGH (ref 11.5–15.5)
WBC: 4.2 10*3/uL (ref 4.0–10.5)
nRBC: 0 % (ref 0.0–0.2)

## 2018-10-16 LAB — BASIC METABOLIC PANEL
Anion gap: 11 (ref 5–15)
BUN: 14 mg/dL (ref 8–23)
CO2: 26 mmol/L (ref 22–32)
Calcium: 9.8 mg/dL (ref 8.9–10.3)
Chloride: 101 mmol/L (ref 98–111)
Creatinine, Ser: 1.2 mg/dL — ABNORMAL HIGH (ref 0.44–1.00)
GFR calc Af Amer: 49 mL/min — ABNORMAL LOW (ref >60)
GFR calc non Af Amer: 43 mL/min — ABNORMAL LOW (ref >60)
Glucose, Bld: 84 mg/dL (ref 70–99)
Potassium: 3.3 mmol/L — ABNORMAL LOW (ref 3.5–5.1)
Sodium: 138 mmol/L (ref 135–145)

## 2018-10-16 LAB — MAGNESIUM: Magnesium: 2 mg/dL (ref 1.7–2.4)

## 2018-10-16 MED ORDER — POTASSIUM CHLORIDE 20 MEQ PO PACK
40.0000 meq | PACK | ORAL | Status: AC
  Administered 2018-10-16 (×2): 40 meq via ORAL
  Filled 2018-10-16 (×2): qty 2

## 2018-10-16 NOTE — Progress Notes (Signed)
PROGRESS NOTE  Shannon Aldape WSF:[id number] DOB: 1936/02/04 DOA: 10/15/2018 PCP: Lawerance Cruel, MD   LOS: 1 day   Patient is from: Home.  Lives with daughter.  Uses cane at baseline.  Brief Narrative / Interim history: 83 year old female with history of HTN, CKD-3, hypokalemia, esophageal diverticulum/Schatzki ring/esophageal stricture presenting with dysphagia with solid food.   Patient had EGD dilation and Botox injection by [facility] about 2 months ago.  Followed by Dr. gross, general surgery on 4/20 and has an upcoming appointment on 5/12 for outpatient surgical repair but came to ED due to worsening dysphagia with solid food.  Also found to have AKI on admission.  Valuated by general surgery who suggested GI consult and advancing diet as tolerated. [facility] consulted on 10/16/2018 and will see patient on 10/17/2018.  Subjective: No major events overnight of this morning.  Tolerated beef broth and Jell-O's and some tea for dinner.  Has not had a breakfast by the time I saw her this morning.  Denies chest pain, dyspnea, nausea, vomiting or abdominal pain.  AKI improved.  Had a bowel movement.  Denies melena or hematochezia.   Assessment & Plan: Principal Problem:   Acquired diverticulum of distal esophagus with dysphagia Active Problems:   HTN (hypertension)   Hypokalemia   AKI (acute kidney injury) ([place])  Dysphagia/esophageal diverticulum/stricture/Schatzki ring -Appreciate general surgery input  -Advance diet  -Consult GI-done -Follow GI recommendation -Continue Protonix  Constipation: Resolved.  Had normal bowel movement this morning. -Bowel regimen as needed  AKI on CKD 3: Likely a combination of prerenal etiology in the setting of poor p.o. intake and ATN from ARB/HCTZ.  AKI resolved. -Avoid nephrotoxic meds -Monitor renal function  Hypokalemia: Likely due to IV fluid and HCTZ -Replenish and recheck -Check magnesium  ?Hypothyroidism/history of thyroid cancer-not on  medication at home -Check TSH  Anxiety/fibromyalgia: Stable.  Not on medication at home  Essential hypertension: home losartan/HCTZ on hold due to AKI.  Normotensive -Continue amlodipine  Weight loss likely due to poor p.o. intake Wt Readings from Last 10 Encounters:  10/15/18 76 kg  10/07/18 76.5 kg  08/31/18 77.1 kg  02/17/14 100.2 kg  -Feeding supplement  Scheduled Meds: . amLODipine  5 mg Oral Daily  . docusate sodium  100 mg Oral BID  . enoxaparin (LOVENOX) injection  30 mg Subcutaneous Q24H  . feeding supplement  237 mL Oral BID BM  . pantoprazole  40 mg Oral Daily  . polyethylene glycol  17 g Oral Daily  . potassium chloride  40 mEq Oral Q4H   Continuous Infusions: . lactated ringers Stopped (10/16/18 0649)   PRN Meds:.acetaminophen **OR** acetaminophen, bisacodyl, ondansetron **OR** ondansetron (ZOFRAN) IV, senna-docusate, sodium phosphate   DVT prophylaxis: Subcu Lovenox Code Status: Full code Family Communication: None at bedside.  Updated patient's daughter over the phone. Disposition Plan: Remains inpatient  Consultants:   General surgery  Gastroenterology  Procedures:   None  Microbiology: . None  Antimicrobials:  None  Objective: Vitals:   10/15/18 1600 10/15/18 1610 10/15/18 1648 10/16/18 0522  BP: 129/62  (!) 143/77 120/60  Pulse: 85 92 91 75  Resp: 16 (!) 22 16 18   Temp:   98 F (36.7 C) 98.3 F (36.8 C)  TempSrc:   Oral Oral  SpO2: 100%  100% 99%  Weight:   76 kg   Height:   5\' 7"  (1.702 m)     Intake/Output Summary (Last 24 hours) at 10/16/2018 1433 Last data filed at  10/16/2018 0700 Gross per 24 hour  Intake 1443.89 ml  Output -  Net 1443.89 ml   Filed Weights   10/15/18 1307 10/15/18 1648  Weight: 76.2 kg 76 kg    Examination:  GENERAL: No acute distress.  Appears well.  HEENT: MMM.  Vision and hearing grossly intact.  No glossitis NECK: Supple.  No JVD.  LUNGS:  No IWOB. Good air movement bilaterally. HEART:  RRR.  Heart sounds normal.  ABD: Bowel sounds present. Soft. Non tender.  MSK/EXT:  Moves all extremities. No apparent deformity. No edema bilaterally.  SKIN: no apparent skin lesion or wound NEURO: Awake, alert and oriented appropriately.  No gross deficit.  PSYCH: Calm. Normal affect.    Data Reviewed: I have independently reviewed following labs and imaging studies  CBC: Recent Labs  Lab 10/15/18 1325 10/16/18 0205  WBC 6.6 4.2  NEUTROABS 3.4  --   HGB 11.2* 8.3*  HCT 34.1* 24.7*  MCV 71.6* 69.2*  PLT 307 259   Basic Metabolic Panel: Recent Labs  Lab 10/15/18 1325 10/16/18 0205  NA 136 138  K 2.5* 3.3*  CL 95* 101  CO2 24 26  GLUCOSE 147* 84  BUN 18 14  CREATININE 1.91* 1.20*  CALCIUM 10.6* 9.8  MG 1.6* 2.0   GFR: Estimated Creatinine Clearance: 39.8 mL/min (A) (by C-G formula based on SCr of 1.2 mg/dL (H)). Liver Function Tests: Recent Labs  Lab 10/15/18 1325  AST 27  ALT 14  ALKPHOS 45  BILITOT 1.3*  PROT 7.5  ALBUMIN 4.2   No results for input(s): LIPASE, AMYLASE in the last 168 hours. No results for input(s): AMMONIA in the last 168 hours. Coagulation Profile: No results for input(s): INR, PROTIME in the last 168 hours. Cardiac Enzymes: Recent Labs  Lab 10/15/18 1325  TROPONINI 0.05*   BNP (last 3 results) No results for input(s): PROBNP in the last 8760 hours. HbA1C: No results for input(s): HGBA1C in the last 72 hours. CBG: Recent Labs  Lab 10/15/18 1304  GLUCAP 135*   Lipid Profile: No results for input(s): CHOL, HDL, LDLCALC, TRIG, CHOLHDL, LDLDIRECT in the last 72 hours. Thyroid Function Tests: No results for input(s): TSH, T4TOTAL, FREET4, T3FREE, THYROIDAB in the last 72 hours. Anemia Panel: No results for input(s): VITAMINB12, FOLATE, FERRITIN, TIBC, IRON, RETICCTPCT in the last 72 hours. Urine analysis:    Component Value Date/Time   COLORURINE YELLOW 08/31/2018 1619   APPEARANCEUR HAZY (A) 08/31/2018 1619   LABSPEC 1.017  08/31/2018 1619   PHURINE 5.0 08/31/2018 1619   GLUCOSEU NEGATIVE 08/31/2018 1619   HGBUR NEGATIVE 08/31/2018 1619   BILIRUBINUR NEGATIVE 08/31/2018 1619   KETONESUR 20 (A) 08/31/2018 1619   PROTEINUR 30 (A) 08/31/2018 1619   UROBILINOGEN 0.2 02/16/2014 1737   NITRITE NEGATIVE 08/31/2018 1619   LEUKOCYTESUR NEGATIVE 08/31/2018 1619   Sepsis Labs: Invalid input(s): PROCALCITONIN, LACTICIDVEN  Recent Results (from the past 240 hour(s))  SARS Coronavirus 2 (CEPHEID - Performed in [place] hospital lab), Hosp Order     Status: None   Collection Time: 10/15/18  1:39 PM  Result Value Ref Range Status   SARS Coronavirus 2 NEGATIVE NEGATIVE Final    Comment: (NOTE) If result is NEGATIVE SARS-CoV-2 target nucleic acids are NOT DETECTED. The SARS-CoV-2 RNA is generally detectable in upper and lower  respiratory specimens during the acute phase of infection. The lowest  concentration of SARS-CoV-2 viral copies this assay can detect is 250  copies / mL. A negative  result does not preclude SARS-CoV-2 infection  and should not be used as the sole basis for treatment or other  patient management decisions.  A negative result may occur with  improper specimen collection / handling, submission of specimen other  than nasopharyngeal swab, presence of viral mutation(s) within the  areas targeted by this assay, and inadequate number of viral copies  (<250 copies / mL). A negative result must be combined with clinical  observations, patient history, and epidemiological information. If result is POSITIVE SARS-CoV-2 target nucleic acids are DETECTED. The SARS-CoV-2 RNA is generally detectable in upper and lower  respiratory specimens dur ing the acute phase of infection.  Positive  results are indicative of active infection with SARS-CoV-2.  Clinical  correlation with patient history and other diagnostic information is  necessary to determine patient infection status.  Positive results do  not  rule out bacterial infection or co-infection with other viruses. If result is PRESUMPTIVE POSTIVE SARS-CoV-2 nucleic acids MAY BE PRESENT.   A presumptive positive result was obtained on the submitted specimen  and confirmed on repeat testing.  While 2019 novel coronavirus  (SARS-CoV-2) nucleic acids may be present in the submitted sample  additional confirmatory testing may be necessary for epidemiological  and / or clinical management purposes  to differentiate between  SARS-CoV-2 and other Sarbecovirus currently known to infect humans.  If clinically indicated additional testing with an alternate test  methodology [phone]) is advised. The SARS-CoV-2 RNA is generally  detectable in upper and lower respiratory sp ecimens during the acute  phase of infection. The expected result is Negative. Fact Sheet for Patients:  StrictlyIdeas.no Fact Sheet for Healthcare Providers: BankingDealers.co.za This test is not yet approved or cleared by the [place] FDA and has been authorized for detection and/or diagnosis of SARS-CoV-2 by FDA under an Emergency Use Authorization (EUA).  This EUA will remain in effect (meaning this test can be used) for the duration of the COVID-19 declaration under Section 564(b)(1) of the Act, 21 U.S.C. section 360bbb-3(b)(1), unless the authorization is terminated or revoked sooner. Performed at [facility], [place] [address] [place], [place] [address]       Radiology Studies: No results found.   Kherington Meraz T. [facility] Pager [phone]  If 7PM-7AM, please contact night-coverage [email or web address] Password [facility] 10/16/2018, 2:33 PM

## 2018-10-16 NOTE — Progress Notes (Signed)
Subjective No acute events. Feeling well. Tolerating clear liquids without issue  Objective: Vital signs in last 24 hours: Temp:  [98 F (36.7 C)-98.3 F (36.8 C)] 98.3 F (36.8 C) (05/03 0522) Pulse Rate:  [75-106] 75 (05/03 0522) Resp:  [12-25] 18 (05/03 0522) BP: (110-154)/(53-77) 120/60 (05/03 0522) SpO2:  [99 %-100 %] 99 % (05/03 0522) Weight:  [76 kg-76.2 kg] 76 kg (05/02 1648) Last BM Date: 10/11/18(Approximate per patient.)  Intake/Output from previous day: 05/02 0701 - 05/03 0700 In: 1443.9 [P.O.:300; I.V.:1143.9] Out: -  Intake/Output this shift: No intake/output data recorded.  Gen: NAD, comfortable CV: RRR Pulm: Normal work of breathing Abd: Soft, NT/ND Ext: SCDs in place  Lab Results: CBC  Recent Labs    10/15/18 1325 10/16/18 0205  WBC 6.6 4.2  HGB 11.2* 8.3*  HCT 34.1* 24.7*  PLT 307 254   BMET Recent Labs    10/15/18 1325 10/16/18 0205  NA 136 138  K 2.5* 3.3*  CL 95* 101  CO2 24 26  GLUCOSE 147* 84  BUN 18 14  CREATININE 1.91* 1.20*  CALCIUM 10.6* 9.8   PT/INR No results for input(s): LABPROT, INR in the last 72 hours. ABG No results for input(s): PHART, HCO3 in the last 72 hours.  Invalid input(s): PCO2, PO2  Studies/Results:  Anti-infectives: Anti-infectives (From admission, onward)   None       Assessment/Plan: Patient Active Problem List   Diagnosis Date Noted  . Esophageal stricture 10/15/2018  . Acquired diverticulum of distal esophagus with dysphagia 10/03/2018  . Esophageal stenosis 08/31/2018  . Hypokalemia 08/31/2018  . AKI (acute kidney injury) ([place]) 08/31/2018  . Bradycardia 02/16/2014  . Near syncope 02/16/2014  . HTN (hypertension) 02/16/2014   Shannon Ortiz is an 83 y.o. female with distal esophageal diverticulum and Schatzki ring/distal eso stricture - has now undergone multiple dilations and botox with transient improvement  -Last dilation + botox was 09/01/2018. She reports improvement that lasts 3-4  weeks and then symptoms begin to return. She was noted to have food within diverticulum at that time as well -Manometry just completed 4/24; no achalasia or DES -Advance to full liquids + protein shakes. If tolerates, will try pureed diet tomorrow. -Prealbumin 15.4; Cr improved - 1.2 today -GI consult for possible repeat dilation if fails to tolerate full liquids   LOS: 1 day   Shannon Ortiz, M.D. [facility]

## 2018-10-17 ENCOUNTER — Encounter (HOSPITAL_COMMUNITY): Payer: Self-pay

## 2018-10-17 LAB — HEMOGLOBIN AND HEMATOCRIT, BLOOD
HCT: 25.3 % — ABNORMAL LOW (ref 36.0–46.0)
Hemoglobin: 8.5 g/dL — ABNORMAL LOW (ref 12.0–15.0)

## 2018-10-17 LAB — BASIC METABOLIC PANEL
Anion gap: 10 (ref 5–15)
BUN: 9 mg/dL (ref 8–23)
CO2: 26 mmol/L (ref 22–32)
Calcium: 10 mg/dL (ref 8.9–10.3)
Chloride: 102 mmol/L (ref 98–111)
Creatinine, Ser: 1.02 mg/dL — ABNORMAL HIGH (ref 0.44–1.00)
GFR calc Af Amer: >60 mL/min (ref >60)
GFR calc non Af Amer: 52 mL/min — ABNORMAL LOW (ref >60)
Glucose, Bld: 81 mg/dL (ref 70–99)
Potassium: 4 mmol/L (ref 3.5–5.1)
Sodium: 138 mmol/L (ref 135–145)

## 2018-10-17 LAB — MAGNESIUM: Magnesium: 1.5 mg/dL — ABNORMAL LOW (ref 1.7–2.4)

## 2018-10-17 MED ORDER — ENOXAPARIN SODIUM 40 MG/0.4ML ~~LOC~~ SOLN: 40.0000 mg | SUBCUTANEOUS | Status: DC

## 2018-10-17 MED ORDER — MAGNESIUM SULFATE 2 GM/50ML IV SOLN
2.0000 g | Freq: Once | INTRAVENOUS | Status: AC
  Administered 2018-10-17: 2 g via INTRAVENOUS
  Filled 2018-10-17: qty 50

## 2018-10-17 MED ORDER — ENSURE SURGERY PO LIQD: 237.0000 mL | Freq: Two times a day (BID) | ORAL | 0 refills | Status: DC

## 2018-10-17 NOTE — Discharge Summary (Signed)
Physician Discharge Summary  Shannon Ortiz HGD:[id number] DOB: January 09, 1936 DOA: 10/15/2018  PCP: Lawerance Cruel, MD  Admit date: 10/15/2018 Discharge date: 10/17/2018  Admitted From: Home Disposition: Home  Recommendations for Outpatient Follow-up:  1. Follow up with general surgery on 10/25/2018 2. Please obtain CBC/BMP/Mag at follow up 3. Please follow up on the following pending results: None  Home Health: None Equipment/Devices: None  Discharge Condition: Stable CODE STATUS: Full code  Hospital Course: 83 year old female with history of HTN, CKD-3, hypokalemia, esophageal diverticulum/Schatzki ring/esophageal stricture presenting with dysphagia with solid food.   Patient had EGD dilation and Botox injection by [facility] about 2 months ago.  Followed by Dr. gross, general surgery on 4/20 and has an upcoming appointment on 5/12 for outpatient surgical repair but came to ED due to worsening dysphagia with solid food.  Also found to have AKI on admission.  Evaluated by general surgery who suggested GI consult and advancing diet as tolerated. [facility] consulted and did not feel repeat endoscopy for balloon dilation or Botox injection would provide any symptomatic relief, and concurred with general surgery on outpatient surgical intervention as planned if patient tolerates full liquid diet that she did. This  was discussed with patient's daughter over the phone who voiced understanding.   Patient's  AKI resolved on discharge.  See individual problem list below for more.  Discharge Diagnoses:  Principal Problem:   Acquired diverticulum of distal esophagus with dysphagia Active Problems:   HTN (hypertension)   Hypokalemia   AKI (acute kidney injury) ([place])  Dysphagia/esophageal diverticulum/stricture/Schatzki ring: Tolerated full liquid diet.  Patient is somewhat anxious. -Appreciate general surgery and GI recommendations. -Discharged on full liquid diet and Ensure. -Patient has an  upcoming appointment with general surgery on 10/25/2018.  Constipation: Resolved.  AKI on CKD 3: Likely a combination of prerenal etiology in the setting of poor p.o. intake and ATN from ARB/HCTZ.   Resolved. -ARB and HCTZ held on discharge. -Repeat BMP at follow-up.  Hypokalemia: Likely due to IV fluid and HCTZ -Resolved.  Hypomagnesemia: Replenished prior to discharge.  Anxiety/fibromyalgia: Stable.  Not on medication at home  Essential hypertension: home losartan/HCTZ on hold due to AKI.  Normotensive -Discharged on home amlodipine. -Arfeen HCTZ held on discharge due to AKI.   Discharge Instructions  Discharge Instructions    Call MD for:  persistant dizziness or light-headedness   Complete by:  As directed    Call MD for:  persistant nausea and vomiting   Complete by:  As directed    Call MD for:  severe uncontrolled pain   Complete by:  As directed    Call MD for:  temperature >100.4   Complete by:  As directed    Diet - low sodium heart healthy   Complete by:  As directed    Full liquid diet   Discharge instructions   Complete by:  As directed    It has been a pleasure taking care of you! You were admitted due to some difficulty swallowing.  You were evaluated by general surgery and gastroenterology.  The recommendation is to continue full liquid diet such as soap, broth, Jell-O's... until you see your surgeon.  There could be some changes to your medication.  Please review your medication list and the directions before you take them.  Once you are discharged, your primary care physician will handle any further medical issues. Please note that NO REFILLS for any discharge medications will be authorized once you are discharged,  as it is imperative that you return to your primary care physician (or establish a relationship with a primary care physician if you do not have one) for your aftercare needs so that they can reassess your need for medications and monitor your  lab values. Take care,   Increase activity slowly   Complete by:  As directed      Allergies as of 10/17/2018      Reactions   Elavil [amitriptyline Hcl] Hives   Ketek [telithromycin] Hives      Medication List    STOP taking these medications   losartan-hydrochlorothiazide 100-25 MG tablet Commonly known as:  HYZAAR     TAKE these medications   amLODipine 5 MG tablet Commonly known as:  NORVASC Take 5 mg by mouth daily.   feeding supplement (ENSURE ENLIVE) Liqd Take 237 mLs by mouth 3 (three) times daily between meals. What changed:  when to take this   feeding supplement Liqd Take 237 mLs by mouth 2 (two) times daily between meals. What changed:  You were already taking a medication with the same name, and this prescription was added. Make sure you understand how and when to take each.   multivitamin with minerals Tabs tablet Take 1 tablet by mouth daily. What changed:  how much to take   pantoprazole 40 MG tablet Commonly known as:  PROTONIX Take 40 mg by mouth daily.   polyethylene glycol 17 g packet Commonly known as:  MIRALAX / GLYCOLAX Take 17 g by mouth daily.   senna-docusate 8.6-50 MG tablet Commonly known as:  Senokot-S Take 2 tablets by mouth at bedtime as needed for mild constipation.        Consultations:  General surgery  Gastroenterology  Procedures/Studies:  2D Echo: None obtained this admission  Dg Chest Portable 1 View  Result Date: 10/15/2018 CLINICAL DATA:  I had miss, palpitations, no dysphagia EXAM: PORTABLE CHEST 1 VIEW COMPARISON:  02/16/2014 FINDINGS: The heart size and mediastinal contours are within normal limits. Both lungs are clear. The visualized skeletal structures are unremarkable. IMPRESSION: No active disease. Electronically Signed   By: Kathreen Devoid   On: 10/15/2018 13:47      Subjective: No major events overnight of this morning.  Tolerated full liquid diet this morning.  No complaints.  No cardiopulmonary or  other GI symptoms.   Discharge Exam: Vitals:   10/16/18 2119 10/17/18 0521  BP: (!) 144/72 139/70  Pulse: 87 79  Resp: 16 18  Temp: 98.4 F (36.9 C) 98.2 F (36.8 C)  SpO2: 100% 98%    GENERAL: No acute distress.  Appears well.  HEENT: MMM.  Vision and hearing grossly intact.  NECK: Supple.  No JVD.  LUNGS:  No IWOB. Good air movement bilaterally. HEART:  RRR. Heart sounds normal.  ABD: Bowel sounds present. Soft. Non tender.  MSK/EXT:  Moves all extremities. No apparent deformity. No edema bilaterally.  SKIN: no apparent skin lesion or wound NEURO: Awake, alert and oriented appropriately.  No gross deficit.  PSYCH: Calm. Normal affect.     The results of significant diagnostics from this hospitalization (including imaging, microbiology, ancillary and laboratory) are listed below for reference.     Microbiology: Recent Results (from the past 240 hour(s))  SARS Coronavirus 2 (CEPHEID - Performed in [place] hospital lab), Hosp Order     Status: None   Collection Time: 10/15/18  1:39 PM  Result Value Ref Range Status   SARS Coronavirus 2 NEGATIVE NEGATIVE Final  Comment: (NOTE) If result is NEGATIVE SARS-CoV-2 target nucleic acids are NOT DETECTED. The SARS-CoV-2 RNA is generally detectable in upper and lower  respiratory specimens during the acute phase of infection. The lowest  concentration of SARS-CoV-2 viral copies this assay can detect is 250  copies / mL. A negative result does not preclude SARS-CoV-2 infection  and should not be used as the sole basis for treatment or other  patient management decisions.  A negative result may occur with  improper specimen collection / handling, submission of specimen other  than nasopharyngeal swab, presence of viral mutation(s) within the  areas targeted by this assay, and inadequate number of viral copies  (<250 copies / mL). A negative result must be combined with clinical  observations, patient history, and  epidemiological information. If result is POSITIVE SARS-CoV-2 target nucleic acids are DETECTED. The SARS-CoV-2 RNA is generally detectable in upper and lower  respiratory specimens dur ing the acute phase of infection.  Positive  results are indicative of active infection with SARS-CoV-2.  Clinical  correlation with patient history and other diagnostic information is  necessary to determine patient infection status.  Positive results do  not rule out bacterial infection or co-infection with other viruses. If result is PRESUMPTIVE POSTIVE SARS-CoV-2 nucleic acids MAY BE PRESENT.   A presumptive positive result was obtained on the submitted specimen  and confirmed on repeat testing.  While 2019 novel coronavirus  (SARS-CoV-2) nucleic acids may be present in the submitted sample  additional confirmatory testing may be necessary for epidemiological  and / or clinical management purposes  to differentiate between  SARS-CoV-2 and other Sarbecovirus currently known to infect humans.  If clinically indicated additional testing with an alternate test  methodology [phone]) is advised. The SARS-CoV-2 RNA is generally  detectable in upper and lower respiratory sp ecimens during the acute  phase of infection. The expected result is Negative. Fact Sheet for Patients:  StrictlyIdeas.no Fact Sheet for Healthcare Providers: BankingDealers.co.za This test is not yet approved or cleared by the [place] FDA and has been authorized for detection and/or diagnosis of SARS-CoV-2 by FDA under an Emergency Use Authorization (EUA).  This EUA will remain in effect (meaning this test can be used) for the duration of the COVID-19 declaration under Section 564(b)(1) of the Act, 21 U.S.C. section 360bbb-3(b)(1), unless the authorization is terminated or revoked sooner. Performed at [facility], [place] [address] [place], [place] [address]      Labs:  BNP (last 3 results) No results for input(s): BNP in the last 8760 hours. Basic Metabolic Panel: Recent Labs  Lab 10/15/18 1325 10/16/18 0205 10/17/18 0319  NA 136 138 138  K 2.5* 3.3* 4.0  CL 95* 101 102  CO2 24 26 26   GLUCOSE 147* 84 81  BUN 18 14 9   CREATININE 1.91* 1.20* 1.02*  CALCIUM 10.6* 9.8 10.0  MG 1.6* 2.0 1.5*   Liver Function Tests: Recent Labs  Lab 10/15/18 1325  AST 27  ALT 14  ALKPHOS 45  BILITOT 1.3*  PROT 7.5  ALBUMIN 4.2   No results for input(s): LIPASE, AMYLASE in the last 168 hours. No results for input(s): AMMONIA in the last 168 hours. CBC: Recent Labs  Lab 10/15/18 1325 10/16/18 0205 10/17/18 0319  WBC 6.6 4.2  --   NEUTROABS 3.4  --   --   HGB 11.2* 8.3* 8.5*  HCT 34.1* 24.7* 25.3*  MCV 71.6* 69.2*  --   PLT 307 254  --  Cardiac Enzymes: Recent Labs  Lab 10/15/18 1325  TROPONINI 0.05*   BNP: Invalid input(s): POCBNP CBG: Recent Labs  Lab 10/15/18 1304  GLUCAP 135*   D-Dimer No results for input(s): DDIMER in the last 72 hours. Hgb A1c No results for input(s): HGBA1C in the last 72 hours. Lipid Profile No results for input(s): CHOL, HDL, LDLCALC, TRIG, CHOLHDL, LDLDIRECT in the last 72 hours. Thyroid function studies No results for input(s): TSH, T4TOTAL, T3FREE, THYROIDAB in the last 72 hours.  Invalid input(s): FREET3 Anemia work up No results for input(s): VITAMINB12, FOLATE, FERRITIN, TIBC, IRON, RETICCTPCT in the last 72 hours. Urinalysis    Component Value Date/Time   COLORURINE YELLOW 08/31/2018 1619   APPEARANCEUR HAZY (A) 08/31/2018 1619   LABSPEC 1.017 08/31/2018 1619   PHURINE 5.0 08/31/2018 1619   GLUCOSEU NEGATIVE 08/31/2018 1619   HGBUR NEGATIVE 08/31/2018 1619   BILIRUBINUR NEGATIVE 08/31/2018 1619   KETONESUR 20 (A) 08/31/2018 1619   PROTEINUR 30 (A) 08/31/2018 1619   UROBILINOGEN 0.2 02/16/2014 1737   NITRITE NEGATIVE 08/31/2018 1619   LEUKOCYTESUR NEGATIVE 08/31/2018 1619   Sepsis Labs  Invalid input(s): PROCALCITONIN,  WBC,  LACTICIDVEN   Time coordinating discharge: 35 minutes  SIGNED:  Mercy Riding, MD  [facility] 10/17/2018, 10:33 AM Pager [phone]  If 7PM-7AM, please contact night-coverage [email or web address] Password [email or web address]

## 2018-10-17 NOTE — Progress Notes (Signed)
   Subjective/Chief Complaint: hard to swallow Pt tolerated full liquids without problem    Objective: Vital signs in last 24 hours: Temp:  [98.2 F (36.8 C)-98.4 F (36.9 C)] 98.2 F (36.8 C) (05/04 0521) Pulse Rate:  [76-87] 79 (05/04 0521) Resp:  [15-18] 18 (05/04 0521) BP: (139-145)/(70-85) 139/70 (05/04 0521) SpO2:  [98 %-100 %] 98 % (05/04 0521) Last BM Date: 10/16/18  Intake/Output from previous day: 05/03 0701 - 05/04 0700 In: 1321.8 [P.O.:720; I.V.:601.8] Out: -  Intake/Output this shift: No intake/output data recorded.  General appearance: alert and cooperative Head: Normocephalic, without obvious abnormality, atraumatic GI: soft, non-tender; bowel sounds normal; no masses,  no organomegaly  Lab Results:  Recent Labs    10/15/18 1325 10/16/18 0205 10/17/18 0319  WBC 6.6 4.2  --   HGB 11.2* 8.3* 8.5*  HCT 34.1* 24.7* 25.3*  PLT 307 254  --    BMET Recent Labs    10/16/18 0205 10/17/18 0319  NA 138 138  K 3.3* 4.0  CL 101 102  CO2 26 26  GLUCOSE 84 81  BUN 14 9  CREATININE 1.20* 1.02*  CALCIUM 9.8 10.0   PT/INR No results for input(s): LABPROT, INR in the last 72 hours. ABG No results for input(s): PHART, HCO3 in the last 72 hours.  Invalid input(s): PCO2, PO2  Studies/Results: Dg Chest Portable 1 View  Result Date: 10/15/2018 CLINICAL DATA:  I had miss, palpitations, no dysphagia EXAM: PORTABLE CHEST 1 VIEW COMPARISON:  02/16/2014 FINDINGS: The heart size and mediastinal contours are within normal limits. Both lungs are clear. The visualized skeletal structures are unremarkable. IMPRESSION: No active disease. Electronically Signed   By: Kathreen Devoid   On: 10/15/2018 13:47    Anti-infectives: Anti-infectives (From admission, onward)   None      Assessment/Plan: Shannon Ortiz an 83 y.o.femalewith distal esophageal diverticulum and Schatzki ring/distal eso stricture - has now undergone multiple dilations and botox with transient  improvement  -Last dilation + botox was 09/01/2018. She reports improvement that lasts 3-4 weeks and then symptoms begin to return. She was noted to have food within diverticulum at that time as well -Manometry just completed 4/24; no achalasia or DES -Advance to full liquids + protein shakes. She can go home on this  Will arrange for follow up with D Gross and try to push up surgery sooner if able  Instruction provided to the patient    LOS: 2 days    Shannon Ortiz 10/17/2018

## 2018-10-17 NOTE — Discharge Instructions (Addendum)
[facility] Stay Proper nutrition can help your body recover from illness and injury.   Foods and beverages high in protein, vitamins, and minerals help rebuild muscle loss, promote healing, & reduce fall risk.   In addition to eating healthy foods, a nutrition shake is an easy, delicious way to get the nutrition you need during and after your hospital stay  It is recommended that you continue to drink 2 bottles per day of:       Ensuere Plus or equivalent  for at least 1 month (30 days) after your hospital stay   Tips for adding a nutrition shake into your routine: As allowed, drink one with vitamins or medications instead of water or juice Enjoy one as a tasty mid-morning or afternoon snack Drink cold or make a milkshake out of it Drink one instead of milk with cereal or snacks Use as a coffee creamer   Available at the following grocery stores and pharmacies:           * [place] [phone]            For COUPONS visit: [email or web address]/join or [email or web address]   Suggested Substitutions Ensure Plus = Boost Plus = Carnation Breakfast Essentials = Boost Compact Ensure Active Clear = Boost Breeze Glucerna Shake = Boost Glucose Control = Carnation Breakfast Essentials SUGAR FREE  Full Liquid Diet Nutrition Therapy  The full liquid diet includes mostly liquids (including milk) and some foods with small amounts of fiber. The full liquid diet can provide many of the nutrients your body needs, but it may not give enough vitamins, minerals, and fiber.  A fluid is anything that is liquid or anything that would melt if left at room temperature.  You will need to count these foods and liquids--including any liquid used to take medication--as part of your daily fluid intake. Some examples are:  Coffee, tea,  and other hot beverages  Gelatin   Gravy  Ice cream, sherbet, sorbet  Ice cubes, ice chips  Milk, liquid creamer  Nutritional supplements  Popsicles  Vegetable and fruit juices; fluid in canned fruit (fruit itself is not allowed)  Yogurt (without nuts, seeds, or fruit)  Soft drinks, lemonade, limeade  Soups (strained)  Syrup This diet should only be used temporarily during your recovery until it is safe for you to eat regular foods. Your RDN can help establish a nutritionally-balanced full liquid meal plan, if needed.    Tips  Determine the which foods/fluids from the list are most appealing to you.  Eat or drink your favorite flavors to help you better enjoy this diet.  Include milk-based or dairy-type fluids and juices. If you are lactose intolerant, choose nut and seed milks.  Eat 3 full liquid meals throughout the day and include a snack time between each meal.  Drink nutritional shakes in 6-8 ounce servings as part of a meal or between meals to make sure youre taking in enough calories. You can make fortified shakes for yourself or buy them premade at a store. Foods Recommended Food Group Foods Recommended  Grains Thin hot cereal, such as cream of wheat  Dairy Milk: Nonfat, 1%, 2%, whole Soy milk, almond milk, rice  milk, coconut milk, cashew milk Milkshakes Yogurt Custard Pudding  Vegetables Vegetable juice with or without pulp Thin, pureed vegetable soups  Fruits Translucent fruit juices without pulp (apple, cranberry, grape)  Oils Almond, avocado, canola, cashew, corn, grapeseed, olive, safflower, sesame, soybean, sunflower Butter (melted) Margarine (melted) that does not contain trans fat (read the product label)  Other Flavored gelatin (any flavor) Strained cream soups Chicken, beef, or vegetable broths Popsicle  Beverages Water Ice Soda Tea Coffee Nutritional supplements or shakes    A high-protein shake should contain at least 8-10 grams of  protein per serving. Read product labels to find a shake that is high in protein. If you are preparing the shake at home, you can increase the amount of protein by adding protein powder, non-fat dry milk powder, yogurt, or low-fat milk.     Foods Not Recommended Food Group Foods Not Recommended  Grains All grain foods including whole grains, processed grains such as pasta, rice, cold cereals, bread, snacks, and sweets that are flour-based (cakes, cookies)  Protein Foods Beef and pork (all cuts) Chicken and [place] (all cuts) Fish (all types) Nuts and nut butters (all types) Eggs (all types) All meat substitutes (such as soy and tofu) All old cuts or lunch meat (such as salami, ham) Sausage (all types)  Dairy Hard cheese Yogurt with fruit chunks  Vegetables Whole, frozen, fresh, canned varieties   Fruits Whole, frozen, fresh, canned varieties   Oils Butter Coconut oil Palm Oil Lard  Full Liquid Nutrition Therapy Sample 1-Day Menu View Nutrient Info  Breakfast 1/2 cup orange juice (without pulp) 1 cup cream of wheat 1 cup skim milk 6 ounces nonfat yogurt (without nuts, seeds, or fruit) 8 ounces coffee  Lunch 1 cup apple juice 1 cup tomato soup 1/2 cup chocolate pudding 1 cup high protein chocolate shake 8 ounces tea  Evening Meal 1/2 cup grape juice 1 cup skim milk 1 cup high-protein vanilla shake 1 cup strained, blended cream of broccoli soup 1/4 cup custard  Evening Snack 1 cup high-protein strawberry shake (without seeds)  Copyright 2020  Academy of Nutrition and Dietetics. All rights reserved. National Dysphagia Diet Pureed Nutrition Therapy  The purpose of this diet is to provide foods that can be successfully and safely swallowed. This diet consists of foods that are easy to swallow because they are blended, whipped, or mashed until they are a lump-free puree texture. A registered dietitian nutritionist can individualize this diet to provide some favorite food items in a  modified form.  Tips  Puree all foods you eat. Foods should not have lumps or require any chewing.  Add small amounts of gravy, sauce, vegetable juice or cooking water, fruit juice, milk, or half and half to foods, then puree them to the consistency of pudding.   Add potato flakes or commercial thickeners to pureed foods that are too thin.   Strain pureed or blended foods to remove lumps, chunks, pulp, or seeds.  Add dry milk powder to food to increase the calories and protein in that food.  Prepare quantities of favorite food items and freeze them in portion sizes for use later.  Reheat foods carefully so that a tough outer crust does not form on them.   Foods Recommended Food Group Foods Recommended  Grains Soft pancakes, breads, sweet rolls, [place] pastries, [place] toasts, muffins, bread dressing, that have been pureed to a pudding consistency. Commercial pureed bread mixes. Well-cooked pasta, noodles, and rice that have been pureed into a  pudding consistency. Pureed oatmeal. Smooth, cooked cereals (farina, cream of wheat) with small amounts of milk. Soft, moist cakes with icing dissolved in milk or juice to form a slurry. Cookies softened with milk, coffee, or other liquid to form a slurry.   Protein Foods Pureed prepared, moistened red meat, beef, pork, or lamb. Gravy and sauce to add moisture. Pureed prepared, moistened poultry, including chicken or [place]. Gravy and sauce to add moisture. Pureed prepared, moistened seafood, including fish (salmon, herring, and sardines), shrimp, lobster, clams, and scallops. Gravy and sauce to add moisture. Pureed prepared moistened ham, hot dogs and deli meats. Pureed tuna, egg, or meat salad Pureed poached, egg dishes such as scrambled, or soft-cooked eggs or egg substitute mashed with butter, margarine, sauce, or gravy. Pureed smooth souffls Pureed smooth casseroles. Pureed, moist macaroni and cheese, well-cooked pasta with meat sauce,  tuna-noodle casserole, soft and moist lasagna. Smooth, nut and seed butters, such as peanut butter, almond butter, and sunflower seed butter if pureed within recipe. Pureed, prepared, moistened, and ground soy foods, such as tofu or tempeh Pureed, prepared, moistened meat alternatives, such as veggie burgers, and sausages based on plant protein Pureed, prepared, and moistened legumes, such as dried beans, lentils, or peas.  Dairy Milk, plain yogurt (without nuts or coconut), sour cream, and whipped topping. Fortified soymilk or milk are acceptable components to add moisture. Pureed cottage cheese and cream cheese. Frozen desserts made from milk such as smooth pudding, custard, ice cream, sherbet, malts, and frozen yogurt.   Vegetables All pureed, cooked, canned or frozen, tender vegetables including dark-green, red and orange vegetables, legumes (beans and peas), and starchy vegetables. Tomato sauce or tomato paste without seeds. Mashed or pureed potatoes without skins seasoned with gravy, butter, margarine, or sour cream.  Fruits All soft, drained pureed fruits including canned and cooked fruits (without skins). Gelatin with canned fruit (except pineapple) that is pureed together. Well-mashed banana or avocado; pureed fresh watermelon without seeds. 100% fruit juice in consistency recommended by a speech therapist.  Oils Vegetable oils, including olive, peanut, and canola oils; margarines and spreads); salad dressing and mayonnaise, butter, strained gravy, cream sauces -white sauce, cheese sauce, hollandaise sauce (for the addition of moisture to foods).  Beverages Coffee, tea, water, 100% fruit juice in the liquid consistency recommended by a speech therapist  Other Puree prepared foods, including all blenderized and strained soups, casseroles, baked goods, and snacks made from recommended ingredients. Smooth pureed sandwiches and pizza. All finely ground seasonings and sweeteners, jelly Pureed  desserts, gelatin, popsicle and pudding pops.  Foods Not Recommended   Food Group Foods Not Recommended  Grains Breads, rolls, crackers, biscuits, pancakes, waffles, [place] toast, muffins, and bread dressing, pasta, noodles, and rice that have not been pureed to pudding consistency. Dry cereals, oatmeal, or cooked cereals with lumps, seeds, or chunks. Dry or coarse cakes, cookies, pies, pastries, bread or rice puddings, or coarse puddings that have not been pureed to pudding consistency. Breads and starches such as biscuits, frozen waffles, sweet breads, doughnuts, pastries, packaged baking mixes, pancakes, cakes, and cookies that have not been pureed to pudding consistency.   Protein Foods Tough, dry, whole, or ground red meat (beef, pork, lamb). Tough, dry, whole, or ground poultry (chicken and [place]). Tough, dry, whole, or ground fried meats, poultry, or fish. Whole or ground deli meats, such as pastrami, bologna, or salami (made of meat or poultry) Tough, dry, whole, or ground processed meats, such as bacon, sausage, hot dogs and ham  Non-pureed fried, scrambled, or hard-cooked eggs Non-pureed or dry casseroles. Whole nuts or seeds. All nut butters if not pureed unless used in an existing recipe that is tolerated. Whole, ground, processed meat alternatives  Dairy Cheese slices or cubes. Non-pureed yogurt or ice cream made with fruit pieces, nuts, or coconut. All cheese except pureed cottage cheese or cheese used in a recipe that is pureed.   Vegetables Any fresh, frozen, canned, or dried vegetables that have not been pureed Non-pureed tomatoes or tomato sauce with seeds Fried vegetables such as potato skins, potato chips or other crunchy vegetable chips, fried or french-fried potatoes, tough or crisp-fried potatoes.   Fruits All non-pureed whole fresh, frozen, canned, or dried fruits that have not been pureed. All pineapple, coconut, or watermelon with seeds. Fruit leather, fruit  roll-ups, fruit snacks, dried fruits.   Oils  All fats with coarse or chunky additives.  Beverages Liquid consistencies other than those specified by a speech therapist.   Other Soups that have lumps, chunks, and are not pureed smooth. Coarsely ground pepper or herbs. Sweets that are not pureed; chunky fruit preserves or seeded jams Sandwiches, pizza (that are not pureed) Nuts, seeds, or sticky foods. Candies with nuts, seeds, or coconut. Chewy caramel, taffy-style candies, or licorice.  [facility] Pureed Foods Sample 1-Day Menu  Breakfast 1/2 cup orange juice, at prescribed consistency 1/2 cup farina 1/4 cup low-fat milk, for farina 1/2 teaspoon brown sugar, lump-free for farina 1 pureed, scrambled egg 1/2 muffin, pureed 1 teaspoon butter, for muffin  Lunch 1/2 cup pureed tomato soup, made with milk 3 pureed saltine crackers 1/2 cup mashed potatoes 1/4 cup gravy 1/2 cup pureed carrots and peas 1/2 cup vanilla pudding 1/2 cup pureed peaches  Evening Meal 1/2 cup pureed potato soup made with milk 3 pureed saltine crackers 1/2 cup pureed green beans 1/2 cup pureed applesauce 6 oz smooth, whipped fruit-flavored yogurt  Copyright 2020  Academy of Nutrition and Dietetics. All rights reserved. High Calorie, High Protein Recipes High-Calorie, High-Protein Baked Custard Makes 4 servings Ingredients  2 cups whole milk  1 cup half-and-half  5 eggs  2/3 cup sugar   teaspoon salt  1 teaspoon vanilla Directions 1. Preheat oven to 350F. 2. Mix milk and cream in saucepan. Heat until mixture steams but does not boil. Remove from heat and cool. 3. Place eggs, sugar, and salt in a bowl and mix well. 4. Pour cooled milk slowly into the mixture and mix well. 5. Fill 4 single-serving baking dishes  full. 6. Set dishes in a baking pan and put in the oven. 7. Pour hot water into the baking pan (do not add water to the custard dishes). The water level should be even with the custard. This  helps custard to bake evenly and have a smooth texture. 8. Bake about 50 minutes or until a knife inserted in the custard comes out clean. 9. Serve warm or cool. 10. Store in the refrigerator. Nutrient Information (per serving): 380 calories, 42 grams carbohydrate, 14 grams protein, 18 grams fat, 9 grams saturated fat, 310 mg sodium, 240 mg calcium.  High-Calorie, High-Protein Instant Pudding Makes 4 servings Ingredients  1 box instant pudding mix  12-ounce can evaporated milk   cup whole milk  Frozen whipped topping (optional)  Chocolate syrup (optional) Directions 1. Wash the lid on the can of milk. 2. Pour the canned and the whole milk into a large bowl or blender container. 3. Slowly pour the instant pudding mix into  the milk and mix or blend until smooth. 4. Pour into 4 dishes. Refrigerate and serve cold. 5. For more calories, top with frozen whipped topping and drizzle with chocolate syrup. Nutrient Information (per serving): 240 calories, 33 grams carbohydrate, 8 grams protein, 9 grams total fat, 5 grams saturated fat, 470 mg sodium, 390 mg potassium, 288 mg calcium. High-Calorie, High-Protein Gelatin Dessert Makes 4 servings Ingredients   cup hot water  1 small box gelatin dessert mix (such as Jell-O), any flavor (not sugar-free)  12-ounce can evaporated milk Directions 1. Dissolve gelatin mix in hot water. 2. Wash the lid on the can of milk. Then add milk to gelatin. Mix. 3. Pour into 4 dishes. Refrigerate until set and ready to serve. Nutrient Information (per serving): 210 calories, 28 grams carbohydrate, 8 grams protein, 7 grams total fat, 4 grams saturated fat, 160 mg sodium, 290 mg potassium, 250 mg calcium.  High-Calorie, High-Protein Fruit Smoothie Makes 1 large serving Ingredients  6 ounces ( cup) orange juice  1 banana  6 frozen strawberries (unsweetened)  1 ounce (3 tablespoons) protein powder Directions Place all ingredients in blender.  Blend until smooth. Nutrient Information (per serving): 320 calories, 60 grams carbohydrate, 19 grams protein, 2 grams total fat, < 1 gram saturated fat, 190 mg sodium, 1020 mg potassium, 160 mg calcium. High-Calorie, High-Protein Eggnog Makes 1 serving Ingredients   cup half-and-half or whole milk  2 tablespoons sugar  3.5-ounce container ice cream   cup refrigerated or frozen egg substitute (such as Egg Beaters)  Note: Do not use whole eggs. They may have harmful germs. Directions Place all ingredients in blender. Blend until smooth. Nutrient Information (per serving):  Made with half-and-half: 410 calories, 45 g carbohydrate, 11 grams protein, 20 grams fat, 13 grams saturated fat, 180 mg sodium, 360 mg potassium, 200 mg calcium.  Made with whole milk: 330 calories, 46 grams carbohydrate, 11 grams protein, 11 grams total fat, 7 grams saturated fat, 190 mg sodium, 390 mg potassium, 220 mg calcium.  Super Cream Soup Makes 3 servings; suitable for individuals on a pureed diet Ingredients  10.5-ounce can condensed cream soup (such as cream of chicken or cream of mushroom)  12-ounce can evaporated milk  1 small jar strained [place] or chicken (baby food) Directions 1. Wash lids of cans and jar before opening. 2. Place all ingredients in blender. Blend until smooth. Nutrient Information (per serving): 290 calories, 20 grams carbohydrate, 15 grams protein, 17 grams total fat, 8 grams saturated fat, 950 mg sodium, 495 mg potassium, 360 mg calcium. Copyright 2020  Academy of Nutrition and Dietetics. All rights reserved.

## 2018-10-17 NOTE — Progress Notes (Signed)
Brief Nutrition Education Note  RD working remotely.  RD consulted for education on a full liquid and pureed diet. Per general surgery and consult notes, pt will be discharged home on a full liquid/pureed diet with protein supplements. She will return for surgery on 10/25/18.   Attempted to reach pt by phone x 2, however, no answer. Noted discharge orders already in place. RD provided "Full Liquid Nutrition Therapy", "National Dysphagia Diet Pureed Nutrition Therapy" and "High Protein, High Calorie Recipes" Nutrition Therapy from [facility] Nutrition Care Manual. Also provided examples of appropriate nutrition supplements to consume and where to purchase after discharge. All information was posted in the AVS/discharge instructions so pt can refer to handouts at home.   No nutrition interventions warranted at this time. If nutrition issues arise, please consult RD.   Shannon Ortiz A. Jimmye Norman, RD, LDN, [place] Registered Dietitian II Certified Diabetes Care and Education Specialist Pager: [phone] After hours Pager: [phone]

## 2018-10-17 NOTE — Consult Note (Signed)
[facility] Consult  Referring Provider: Mercy Riding, MD Primary Care Physician:  Lawerance Cruel, MD Primary Gastroenterologist: Dr.Airianna Kreischer  Reason for Consultation:  Decreased PO tolerance  HPI: Shannon Ortiz is a 83 y.o. female with history of 3 cm esophageal diverticulum, status post dilatation of distal esophagus with Botox injection on 09/01/2018, presented to the ER on 10/15/2018 with progressively worsening dysphasia, decreased appetite, dehydration, weakness and constipation. Patient was in her usual state of health until the last several months when she developed progressively worsening difficulty swallowing with solids more than liquids and is now significantly lost weight, she used to be over 200 pounds, weight 77.1 kg on 09/01/2018, weighed 76 kgs on admission. Patient has been on liquids and soft semisolid such as applesauce, yogurt, potato soup.  She reports a lot of anxiety every time she eats.  She denies symptoms with liquids.  She has been constipated lately and reports having 1 bowel movement in 1 week, denies blood in stools or black stools. On presentation to the ER she was hypokalemic potassium 2.5, and had acute renal insufficiency BUN/creatinine 18/1.91 with a GFR of only 28, these parameters have been reversed subsequently with IV fluid resuscitation. Her hemoglobin on presentation was 11.2 which is dropped down to 8.5 which is likely related to hemodilution, with most of his source of GI bleed. Patient has had an esophageal manometry recently, which showed intact peristalsis, complete bolus clearance and is being planned for an elective surgery by Dr. Michael Boston on 10/25/2018.   Past Medical History:  Diagnosis Date  . Acquired diverticulum of distal esophagus with dysphagia 10/03/2018  . AKI (acute kidney injury) ([place]) 08/31/2018  . Anxiety   . Bradycardia 02/16/2014  . Chest tightness   . DDD (degenerative disc disease), lumbar   . DJD (degenerative joint  disease)   . Dry eyes   . Esophageal stenosis 08/31/2018  . Facial pain   . Fibromyalgia   . Hemorrhoid   . Hypertension   . Hypokalemia 08/31/2018  . Hypothyroidism   . Lightheadedness   . Near syncope 02/16/2014  . Osteopenia   . Sigmoid diverticulosis   . Thyroid cancer ([place])   . Thyroid nodule     Past Surgical History:  Procedure Laterality Date  . BALLOON DILATION N/A 09/01/2018   Procedure: BALLOON DILATION;  Surgeon: Ronnette Juniper, MD;  Location: [place];  Service: Gastroenterology;  Laterality: N/A;  . BIOPSY  09/01/2018   Procedure: BIOPSY;  Surgeon: Ronnette Juniper, MD;  Location: [facility] ENDOSCOPY;  Service: Gastroenterology;;  . Lum Keas INJECTION  09/01/2018   Procedure: BOTOX INJECTION;  Surgeon: Ronnette Juniper, MD;  Location: [place];  Service: Gastroenterology;;  . BREAST SURGERY     breast biopsy  . CHOLECYSTECTOMY    . ESOPHAGEAL MANOMETRY N/A 10/07/2018   Procedure: ESOPHAGEAL MANOMETRY (EM);  Surgeon: Ronnette Juniper, MD;  [facility];  Service: Gastroenterology;  Laterality: N/A;  . ESOPHAGOGASTRODUODENOSCOPY (EGD) WITH PROPOFOL N/A 09/01/2018   Procedure: ESOPHAGOGASTRODUODENOSCOPY (EGD) WITH PROPOFOL;  Surgeon: Ronnette Juniper, MD;  Location: [place];  Service: Gastroenterology;  Laterality: N/A;    Prior to Admission medications   Medication Sig Start Date End Date Taking? Authorizing Provider  amLODipine (NORVASC) 5 MG tablet Take 5 mg by mouth daily.   Yes [provider]  feeding supplement, ENSURE ENLIVE, (ENSURE ENLIVE) LIQD Take 237 mLs by mouth 3 (three) times daily between meals. Patient taking differently: Take 237 mLs by mouth 2 (two) times daily between meals.  09/02/18  Yes Hall, Carole N, DO  losartan-hydrochlorothiazide (HYZAAR) 100-25 MG per tablet Take 1 tablet by mouth daily.   Yes [provider]  Multiple Vitamin (MULTIVITAMIN WITH MINERALS) TABS tablet Take 1 tablet by mouth daily. Patient taking differently: Take 2  tablets by mouth daily.  09/02/18  Yes Hall, Carole N, DO  pantoprazole (PROTONIX) 40 MG tablet Take 40 mg by mouth daily.   Yes [provider]  feeding supplement (ENSURE SURGERY) LIQD Take 237 mLs by mouth 2 (two) times daily between meals. 10/17/18   Mercy Riding, MD  polyethylene glycol (MIRALAX / GLYCOLAX) packet Take 17 g by mouth daily. Patient not taking: Reported on 10/15/2018 09/02/18   Kayleen Memos, DO  senna-docusate (SENOKOT-S) 8.6-50 MG tablet Take 2 tablets by mouth at bedtime as needed for mild constipation. Patient not taking: Reported on 10/15/2018 09/02/18   Kayleen Memos, DO    Current Facility-Administered Medications  Medication Dose Route Frequency Provider Last Rate Last Dose  . acetaminophen (TYLENOL) tablet 650 mg  650 mg Oral Q6H PRN Karmen Bongo, MD       Or  . acetaminophen (TYLENOL) suppository 650 mg  650 mg Rectal Q6H PRN Karmen Bongo, MD      . amLODipine (NORVASC) tablet 5 mg  5 mg Oral Daily Karmen Bongo, MD   5 mg at 10/16/18 5573  . bisacodyl (DULCOLAX) EC tablet 5 mg  5 mg Oral Daily PRN Karmen Bongo, MD   5 mg at 10/15/18 2050  . docusate sodium (COLACE) capsule 100 mg  100 mg Oral BID Karmen Bongo, MD   100 mg at 10/16/18 2142  . enoxaparin (LOVENOX) injection 30 mg  30 mg Subcutaneous Q24H Karmen Bongo, MD   30 mg at 10/16/18 1753  . feeding supplement (ENSURE SURGERY) liquid 237 mL  237 mL Oral BID BM Michael Boston, MD   237 mL at 10/16/18 1519  . ondansetron (ZOFRAN) tablet 4 mg  4 mg Oral Q6H PRN Karmen Bongo, MD       Or  . ondansetron [facility]) injection 4 mg  4 mg Intravenous Q6H PRN Karmen Bongo, MD      . pantoprazole (PROTONIX) EC tablet 40 mg  40 mg Oral Daily Karmen Bongo, MD   40 mg at 10/16/18 2202  . polyethylene glycol (MIRALAX / GLYCOLAX) packet 17 g  17 g Oral Daily Karmen Bongo, MD   17 g at 10/16/18 5427  . senna-docusate (Senokot-S) tablet 2 tablet  2 tablet Oral QHS PRN Karmen Bongo, MD      .  sodium phosphate (FLEET) 7-19 GM/118ML enema 1 enema  1 enema Rectal Once PRN Karmen Bongo, MD        Allergies as of 10/15/2018 - Review Complete 10/15/2018  Allergen Reaction Noted  . Elavil [amitriptyline hcl] Hives 10/06/2018  . Ketek [telithromycin] Hives 10/06/2018    Family History  Problem Relation Age of Onset  . Hypertension Mother   . Diabetes Mother        non-insulin dependant  . CAD Brother     Social History   Socioeconomic History  . Marital status: Widowed    Spouse name: Not on file  . Number of children: Not on file  . Years of education: Not on file  . Highest education level: Not on file  Occupational History  . Not on file  Social Needs  . Financial resource strain: Not on file  . Food insecurity:  Worry: Not on file    Inability: Not on file  . Transportation needs:    Medical: Not on file    Non-medical: Not on file  Tobacco Use  . Smoking status: Never Smoker  . Smokeless tobacco: Never Used  Substance and Sexual Activity  . Alcohol use: No  . Drug use: No  . Sexual activity: Never  Lifestyle  . Physical activity:    Days per week: Not on file    Minutes per session: Not on file  . Stress: Not on file  Relationships  . Social connections:    Talks on phone: Not on file    Gets together: Not on file    Attends religious service: Not on file    Active member of club or organization: Not on file    Attends meetings of clubs or organizations: Not on file    Relationship status: Not on file  . Intimate partner violence:    Fear of current or ex partner: Not on file    Emotionally abused: Not on file    Physically abused: Not on file    Forced sexual activity: Not on file  Other Topics Concern  . Not on file  Social History Narrative  . Not on file    Review of Systems: Positive for: GI: Described in detail in HPI.    Gen:  fatigue, weakness, malaise, involuntary weight loss,Denies any fever, chills, rigors, night sweats,  anorexia, and sleep disorder CV: Denies chest pain, angina, palpitations, syncope, orthopnea, PND, peripheral edema, and claudication. Resp: increased mucous production, Denies dyspnea, cough, sputum, wheezing, coughing up blood. GU : Denies urinary burning, blood in urine, urinary frequency, urinary hesitancy, nocturnal urination, and urinary incontinence. MS: Denies joint pain or swelling.  Denies muscle weakness, cramps, atrophy.  Derm: Denies rash, itching, oral ulcerations, hives, unhealing ulcers.  Psych: Denies depression, anxiety, memory loss, suicidal ideation, hallucinations,  and confusion. Heme: Denies bruising, bleeding, and enlarged lymph nodes. Neuro:  Denies any headaches, dizziness, paresthesias. Endo:  Denies any problems with DM, thyroid, adrenal function.  Physical Exam: Vital signs in last 24 hours: Temp:  [98.2 F (36.8 C)-98.4 F (36.9 C)] 98.2 F (36.8 C) (05/04 0521) Pulse Rate:  [76-87] 79 (05/04 0521) Resp:  [15-18] 18 (05/04 0521) BP: (139-145)/(70-85) 139/70 (05/04 0521) SpO2:  [98 %-100 %] 98 % (05/04 0521) Last BM Date: 10/16/18  General:   Alert,  Well-developed, well-nourished, pleasant and cooperative in NAD Head:  Normocephalic and atraumatic. Eyes:  Sclera clear, no icterus.   Mild pallor Ears:  Normal auditory acuity. Nose:  No deformity, discharge,  or lesions. Mouth:  No deformity or lesions.  Oropharynx pink & moist. Neck:  Supple; no masses or thyromegaly. Lungs:  Clear throughout to auscultation.   No wheezes, crackles, or rhonchi. No acute distress. Heart:  Regular rate and rhythm; no murmurs, clicks, rubs,  or gallops. Extremities:  Without clubbing or edema. Neurologic:  Alert and  oriented x4;  grossly normal neurologically. Skin:  Intact without significant lesions or rashes. Psych:  Alert and cooperative. Normal mood and affect. Abdomen:  Soft, nontender and nondistended. No masses, hepatosplenomegaly or hernias noted. Normal bowel  sounds, without guarding, and without rebound.         Lab Results: Recent Labs    10/15/18 1325 10/16/18 0205 10/17/18 0319  WBC 6.6 4.2  --   HGB 11.2* 8.3* 8.5*  HCT 34.1* 24.7* 25.3*  PLT 307 254  --  BMET Recent Labs    10/15/18 1325 10/16/18 0205 10/17/18 0319  NA 136 138 138  K 2.5* 3.3* 4.0  CL 95* 101 102  CO2 24 26 26   GLUCOSE 147* 84 81  BUN 18 14 9   CREATININE 1.91* 1.20* 1.02*  CALCIUM 10.6* 9.8 10.0   LFT Recent Labs    10/15/18 1325  PROT 7.5  ALBUMIN 4.2  AST 27  ALT 14  ALKPHOS 45  BILITOT 1.3*   PT/INR No results for input(s): LABPROT, INR in the last 72 hours.  Studies/Results: Dg Chest Portable 1 View  Result Date: 10/15/2018 CLINICAL DATA:  I had miss, palpitations, no dysphagia EXAM: PORTABLE CHEST 1 VIEW COMPARISON:  02/16/2014 FINDINGS: The heart size and mediastinal contours are within normal limits. Both lungs are clear. The visualized skeletal structures are unremarkable. IMPRESSION: No active disease. Electronically Signed   By: Kathreen Devoid   On: 10/15/2018 13:47    Impression: Distal esophageal diverticulum, status post balloon dilatation and Botox injection on 09/01/2018, with minimal improvement in symptoms for a few weeks. Progressively worsening dysphasia, dehydration and acute renal failure, failure to thrive on admission. Patient currently on clear liquids with plan to advance to full liquids. Patient currently scheduled for robotic diverticulectomy with myotomy and wrap on 10/25/2018 by Dr. Johney Maine.   Plan: Balloon dilatation and Botox injection did not provide relief of symptoms in 08/2018. Patient appears to be symptomatic from esophageal diverticulum, also has anxiety every time she eats which probably complicates the situation. I do not believe a repeat endoscopy for balloon dilatation or Botox injection will provide any symptomatic relief. If patient is able to tolerate full liquids, agree with outpatient surgery as  planned. Discussed the same with the patient at bedside.   LOS: 2 days   Ronnette Juniper, MD  10/17/2018, 10:35 AM  Pager [phone] If no answer or after 5 PM call [phone]

## 2018-10-20 ENCOUNTER — Ambulatory Visit: Payer: Self-pay | Admitting: Surgery

## 2018-10-20 NOTE — Patient Instructions (Addendum)
Shannon Ortiz     Your procedure is scheduled on: 10-25-2018  Report to [facility] Main  Entrance  Report to [place] at 530 AM    Call this number if you have problems the morning of surgery [phone]   Remember: Do not eat food or drink liquids :After Midnight. BRUSH YOUR TEETH MORNING OF SURGERY AND RINSE YOUR MOUTH OUT, NO CHEWING GUM CANDY OR MINTS.     Take these medicines the morning of surgery with A SIP OF WATER: AMLODIPINE (NORVASC), PANTAPRAZOLE (PROTONIX)                               You may not have any metal on your body including hair pins and              piercings  Do not wear jewelry, make-up, lotions, powders or perfumes, deodorant             Do not wear nail polish.  Do not shave  48 hours prior to surgery.                 Do not bring valuables to the hospital. [place].  Contacts, dentures or bridgework may not be worn into surgery.  Leave suitcase in the car. After surgery it may be brought to your room.     ___________             [facility] - Preparing for Surgery Before surgery, you can play an important role.  Because skin is not sterile, your skin needs to be as free of germs as possible.  You can reduce the number of germs on your skin by washing with CHG (chlorahexidine gluconate) soap before surgery.  CHG is an antiseptic cleaner which kills germs and bonds with the skin to continue killing germs even after washing. Please DO NOT use if you have an allergy to CHG or antibacterial soaps.  If your skin becomes reddened/irritated stop using the CHG and inform your nurse when you arrive at Short Stay. Do not shave (including legs and underarms) for at least 48 hours prior to the first CHG shower.  You may shave your face/neck. Please follow these instructions carefully:  1.  Shower with CHG Soap the night before surgery and the  morning of Surgery.  2.  If you choose to wash  your hair, wash your hair first as usual with your  normal  shampoo.  3.  After you shampoo, rinse your hair and body thoroughly to remove the  shampoo.                           4.  Use CHG as you would any other liquid soap.  You can apply chg directly  to the skin and wash                       Gently with a scrungie or clean washcloth.  5.  Apply the CHG Soap to your body ONLY FROM THE NECK DOWN.   Do not use on face/ open  Wound or open sores. Avoid contact with eyes, ears mouth and genitals (private parts).                       Wash face,  Genitals (private parts) with your normal soap.             6.  Wash thoroughly, paying special attention to the area where your surgery  will be performed.  7.  Thoroughly rinse your body with warm water from the neck down.  8.  DO NOT shower/wash with your normal soap after using and rinsing off  the CHG Soap.                9.  Pat yourself dry with a clean towel.            10.  Wear clean pajamas.            11.  Place clean sheets on your bed the night of your first shower and do not  sleep with pets. Day of Surgery : Do not apply any lotions/deodorants the morning of surgery.  Please wear clean clothes to the hospital/surgery center.  FAILURE TO FOLLOW THESE INSTRUCTIONS MAY RESULT IN THE CANCELLATION OF YOUR SURGERY PATIENT SIGNATURE_________________________________  NURSE SIGNATURE__________________________________  ________________________________________________________________________

## 2018-10-20 NOTE — Progress Notes (Signed)
CALLED WENDY AT [facility] AND REQUESTED COVID 19 TEST ORDER IN Epic ASAP. CALLED PATIENT AND LEFT MESSAGE TO COME TOMORROW 1100 AM TO 300 PM [facility] ENTRANCE FOR COVID 19 TEST

## 2018-10-21 ENCOUNTER — Other Ambulatory Visit (HOSPITAL_COMMUNITY): Admission: RE | Admit: 2018-10-21 | Payer: Medicare Other | Source: Ambulatory Visit

## 2018-10-21 NOTE — Progress Notes (Signed)
Called patient and LM on VM that she never showed up for her COVID 19 testing today for surgery. LM on VM for daughter, Estill Cotta to call us back when she gets this message.

## 2018-10-24 ENCOUNTER — Encounter (HOSPITAL_COMMUNITY): Payer: Self-pay

## 2018-10-24 ENCOUNTER — Other Ambulatory Visit: Payer: Self-pay

## 2018-10-24 ENCOUNTER — Telehealth (HOSPITAL_COMMUNITY): Payer: Self-pay | Admitting: *Deleted

## 2018-10-24 ENCOUNTER — Encounter (HOSPITAL_COMMUNITY)
Admission: RE | Admit: 2018-10-24 | Discharge: 2018-10-24 | Disposition: A | Discharge status: Home / Self Care | Payer: Medicare Other | Source: Ambulatory Visit | Attending: Surgery | Admitting: Surgery

## 2018-10-24 ENCOUNTER — Other Ambulatory Visit (HOSPITAL_COMMUNITY)
Admission: RE | Admit: 2018-10-24 | Discharge: 2018-10-24 | Disposition: A | Discharge status: Home / Self Care | Payer: Medicare Other | Source: Ambulatory Visit | Attending: Surgery | Admitting: Surgery

## 2018-10-24 DIAGNOSIS — K222 Esophageal obstruction: Secondary | ICD-10-CM | POA: Insufficient documentation

## 2018-10-24 DIAGNOSIS — M797 Fibromyalgia: Secondary | ICD-10-CM | POA: Insufficient documentation

## 2018-10-24 DIAGNOSIS — M5136 Other intervertebral disc degeneration, lumbar region: Secondary | ICD-10-CM | POA: Insufficient documentation

## 2018-10-24 DIAGNOSIS — Z79899 Other long term (current) drug therapy: Secondary | ICD-10-CM | POA: Insufficient documentation

## 2018-10-24 DIAGNOSIS — E039 Hypothyroidism, unspecified: Secondary | ICD-10-CM | POA: Insufficient documentation

## 2018-10-24 DIAGNOSIS — Z01818 Encounter for other preprocedural examination: Secondary | ICD-10-CM | POA: Insufficient documentation

## 2018-10-24 DIAGNOSIS — I10 Essential (primary) hypertension: Secondary | ICD-10-CM | POA: Insufficient documentation

## 2018-10-24 DIAGNOSIS — R9431 Abnormal electrocardiogram [ECG] [EKG]: Secondary | ICD-10-CM | POA: Insufficient documentation

## 2018-10-24 DIAGNOSIS — M8580 Other specified disorders of bone density and structure, unspecified site: Secondary | ICD-10-CM | POA: Insufficient documentation

## 2018-10-24 LAB — CBC
HCT: 29.8 % — ABNORMAL LOW (ref 36.0–46.0)
Hemoglobin: 9.7 g/dL — ABNORMAL LOW (ref 12.0–15.0)
MCH: 23.4 pg — ABNORMAL LOW (ref 26.0–34.0)
MCHC: 32.6 g/dL (ref 30.0–36.0)
MCV: 71.8 fL — ABNORMAL LOW (ref 80.0–100.0)
Platelets: 243 10*3/uL (ref 150–400)
RBC: 4.15 MIL/uL (ref 3.87–5.11)
RDW: 18.1 % — ABNORMAL HIGH (ref 11.5–15.5)
WBC: 4.9 10*3/uL (ref 4.0–10.5)
nRBC: 0 % (ref 0.0–0.2)

## 2018-10-24 LAB — ABO/RH: ABO/RH(D): A POS

## 2018-10-24 LAB — SARS CORONAVIRUS 2 BY RT PCR (HOSPITAL ORDER, PERFORMED IN ~~LOC~~ HOSPITAL LAB): SARS Coronavirus 2: NEGATIVE

## 2018-10-24 LAB — BASIC METABOLIC PANEL
Anion gap: 14 (ref 5–15)
BUN: 9 mg/dL (ref 8–23)
CO2: 24 mmol/L (ref 22–32)
Calcium: 10.4 mg/dL — ABNORMAL HIGH (ref 8.9–10.3)
Chloride: 101 mmol/L (ref 98–111)
Creatinine, Ser: 0.77 mg/dL (ref 0.44–1.00)
GFR calc Af Amer: >60 mL/min (ref >60)
GFR calc non Af Amer: >60 mL/min (ref >60)
Glucose, Bld: 107 mg/dL — ABNORMAL HIGH (ref 70–99)
Potassium: 3.3 mmol/L — ABNORMAL LOW (ref 3.5–5.1)
Sodium: 139 mmol/L (ref 135–145)

## 2018-10-24 NOTE — Progress Notes (Signed)
Anesthesia Chart Review   Case:  [id number] Date/Time:  10/25/18 0700   Procedures:      ROBOTIC RESECTION OF ESOPHAGEAL DIVERTICULUM, ESOPHAGEAL CARDIOMYOTOMY, FUNDOPLICATION, (N/A )     ESOPHAGOGASTRODUODENOSCOPY (EGD) (N/A )   Anesthesia type:  General   Pre-op diagnosis:  BENIGN ESOPHAGEAL STRICTURE WITH EPIPHRENIC DISTAL ESOPHAGEAL DIVERTICULUM   Location:  [facility] ROOM 02 / [facility]   Surgeon:  Michael Boston, MD      DISCUSSION:83 yo never smoker with h/o HTN, anxiety, fibromyalgia, hypothyroidism, thyroid cancer, benign esophageal stricture with epiphrenic distal esophageal diverticulum scheduled for above procedure 10/25/18 with Dr. Michael Boston.   Pt last seen by cardiologist, Dr. Jenkins Rouge, 10/07/2018.  Per his note, "Chest Pain: Atypical no need for stress testing, clear to have general anesthesia and surgery."  EKG at PAT visit reviewed with Dr. Gifford Shave.  Ok to proceed.  Will evaluate DOS.    Negative COVID19 swab 10/24/18.  VS: BP (!) 159/86   Pulse 98   Temp 36.8 C (Oral)   Resp 16   Ht 5\' 7"  (1.702 m)   Wt 75.8 kg   SpO2 98%   BMI 26.16 kg/m   PROVIDERS: Lawerance Cruel, MD is PCP   Jenkins Rouge, MD is Cardiologist  LABS: Labs reviewed: Acceptable for surgery. (all labs ordered are listed, but only abnormal results are displayed)  Labs Reviewed  CBC - Abnormal; Notable for the following components:      Result Value   Hemoglobin 9.7 (*)    HCT 29.8 (*)    MCV 71.8 (*)    MCH 23.4 (*)    RDW 18.1 (*)    All other components within normal limits  BASIC METABOLIC PANEL - Abnormal; Notable for the following components:   Potassium 3.3 (*)    Glucose, Bld 107 (*)    Calcium 10.4 (*)    All other components within normal limits  TYPE AND SCREEN     IMAGES: Chest Xray 10/15/2018 FINDINGS: The heart size and mediastinal contours are within normal limits. Both lungs are clear. The visualized skeletal structures are unremarkable.  IMPRESSION: No active  disease.  EKG: 10/24/2018  Rate 112 bpm Undetermined rhythm ST & T wave abnormality, consider lateral ischemia Abnormal ECG  CV:  Past Medical History:  Diagnosis Date  . Acquired diverticulum of distal esophagus with dysphagia 10/03/2018  . AKI (acute kidney injury) ([place]) 08/31/2018  . Anxiety   . Bradycardia 02/16/2014  . Chest tightness   . DDD (degenerative disc disease), lumbar   . DJD (degenerative joint disease)   . Dry eyes   . Esophageal stenosis 08/31/2018  . Facial pain   . Fibromyalgia   . Hemorrhoid   . Hypertension   . Hypokalemia 08/31/2018  . Hypothyroidism   . Lightheadedness   . Near syncope 02/16/2014  . Osteopenia   . Sigmoid diverticulosis   . Thyroid nodule    PT DENIES    Past Surgical History:  Procedure Laterality Date  . ABDOMINAL HYSTERECTOMY     COMPLETE  . BALLOON DILATION N/A 09/01/2018   Procedure: BALLOON DILATION;  Surgeon: Ronnette Juniper, MD;  Location: [place];  Service: Gastroenterology;  Laterality: N/A;  . BIOPSY  09/01/2018   Procedure: BIOPSY;  Surgeon: Ronnette Juniper, MD;  Location: [facility] ENDOSCOPY;  Service: Gastroenterology;;  . Lum Keas INJECTION  09/01/2018   Procedure: BOTOX INJECTION;  Surgeon: Ronnette Juniper, MD;  Location: [place];  Service: Gastroenterology;;  . BREAST SURGERY  breast biopsy  . CHOLECYSTECTOMY    . ESOPHAGEAL MANOMETRY N/A 10/07/2018   Procedure: ESOPHAGEAL MANOMETRY (EM);  Surgeon: Ronnette Juniper, MD;  [facility];  Service: Gastroenterology;  Laterality: N/A;  . ESOPHAGOGASTRODUODENOSCOPY (EGD) WITH PROPOFOL N/A 09/01/2018   Procedure: ESOPHAGOGASTRODUODENOSCOPY (EGD) WITH PROPOFOL;  Surgeon: Ronnette Juniper, MD;  Location: [place];  Service: Gastroenterology;  Laterality: N/A;    MEDICATIONS: . amLODipine (NORVASC) 5 MG tablet  . feeding supplement (ENSURE SURGERY) LIQD  . Multiple Vitamins-Minerals (MULTIVITAMIN ADULT) CHEW  . pantoprazole (PROTONIX) 40 MG tablet  . polyethylene glycol  (MIRALAX / GLYCOLAX) packet  . senna-docusate (SENOKOT-S) 8.6-50 MG tablet   No current facility-administered medications for this encounter.     Maia Plan [facility] Pre-Surgical Testing [phone] 10/24/18 9:21 PM

## 2018-10-24 NOTE — Progress Notes (Signed)
JESSICA ZANETTO PA AWARE OF EKG RESULTS FROM EKG TODAY. PATIENT EKG OK TO USE FOR 10-25-2018 SURGERY.

## 2018-10-24 NOTE — Anesthesia Preprocedure Evaluation (Addendum)
Anesthesia Evaluation  Patient identified by MRN, date of birth, ID band Patient awake    Reviewed: Allergy & Precautions, NPO status , Patient's Chart, lab work & pertinent test results  Airway Mallampati: II  TM Distance: >3 FB Neck ROM: Full    Dental no notable dental hx. (+) Edentulous Upper, Edentulous Lower, Upper Dentures, Lower Dentures   Pulmonary neg pulmonary ROS,    Pulmonary exam normal breath sounds clear to auscultation       Cardiovascular hypertension, Pt. on medications Normal cardiovascular exam Rhythm:Regular Rate:Normal     Neuro/Psych negative neurological ROS  negative psych ROS   GI/Hepatic negative GI ROS, Neg liver ROS,   Endo/Other  Hypothyroidism   Renal/GU negative Renal ROS  negative genitourinary   Musculoskeletal  (+) Fibromyalgia -  Abdominal   Peds negative pediatric ROS (+)  Hematology negative hematology ROS (+)   Anesthesia Other Findings   Reproductive/Obstetrics negative OB ROS                            Anesthesia Physical Anesthesia Plan  ASA: II  Anesthesia Plan: General   Post-op Pain Management:    Induction: Intravenous and Rapid sequence  PONV Risk Score and Plan: 4 or greater and Ondansetron, Dexamethasone and Treatment may vary due to age or medical condition  Airway Management Planned: Oral ETT  Additional Equipment:   Intra-op Plan:   Post-operative Plan: Extubation in OR  Informed Consent: I have reviewed the patients History and Physical, chart, labs and discussed the procedure including the risks, benefits and alternatives for the proposed anesthesia with the patient or authorized representative who has indicated his/her understanding and acceptance.     Dental advisory given  Plan Discussed with: CRNA  Anesthesia Plan Comments: (See PAT note 10/24/18, Konrad Felix, PA-C)       Anesthesia Quick Evaluation

## 2018-10-24 NOTE — Progress Notes (Signed)
CBC RESULTS ROUTED TO DR GROSS Epic INBASKET

## 2018-10-25 ENCOUNTER — Ambulatory Visit (HOSPITAL_COMMUNITY): Payer: Medicare Other | Admitting: Physician Assistant

## 2018-10-25 ENCOUNTER — Encounter (HOSPITAL_COMMUNITY): Payer: Self-pay | Admitting: *Deleted

## 2018-10-25 ENCOUNTER — Inpatient Hospital Stay (HOSPITAL_COMMUNITY)
Admission: AD | Admit: 2018-10-25 | Discharge: 2018-10-28 | DRG: 328 | Disposition: A | Discharge status: Home Health | Payer: Medicare Other | Source: Other Acute Inpatient Hospital | Attending: Surgery | Admitting: Surgery

## 2018-10-25 ENCOUNTER — Encounter (HOSPITAL_COMMUNITY)
Admission: AD | Disposition: A | Discharge status: Home Health | Payer: Self-pay | Source: Other Acute Inpatient Hospital | Attending: Surgery

## 2018-10-25 DIAGNOSIS — K449 Diaphragmatic hernia without obstruction or gangrene: Secondary | ICD-10-CM | POA: Diagnosis present

## 2018-10-25 DIAGNOSIS — Z90710 Acquired absence of both cervix and uterus: Secondary | ICD-10-CM | POA: Diagnosis not present

## 2018-10-25 DIAGNOSIS — M797 Fibromyalgia: Secondary | ICD-10-CM | POA: Diagnosis not present

## 2018-10-25 DIAGNOSIS — I10 Essential (primary) hypertension: Secondary | ICD-10-CM | POA: Diagnosis present

## 2018-10-25 DIAGNOSIS — Z9049 Acquired absence of other specified parts of digestive tract: Secondary | ICD-10-CM | POA: Diagnosis not present

## 2018-10-25 DIAGNOSIS — Z9889 Other specified postprocedural states: Secondary | ICD-10-CM

## 2018-10-25 DIAGNOSIS — M8580 Other specified disorders of bone density and structure, unspecified site: Secondary | ICD-10-CM | POA: Diagnosis not present

## 2018-10-25 DIAGNOSIS — K222 Esophageal obstruction: Secondary | ICD-10-CM | POA: Diagnosis present

## 2018-10-25 DIAGNOSIS — Z1159 Encounter for screening for other viral diseases: Secondary | ICD-10-CM

## 2018-10-25 DIAGNOSIS — K5730 Diverticulosis of large intestine without perforation or abscess without bleeding: Secondary | ICD-10-CM | POA: Diagnosis not present

## 2018-10-25 DIAGNOSIS — E039 Hypothyroidism, unspecified: Secondary | ICD-10-CM | POA: Diagnosis present

## 2018-10-25 DIAGNOSIS — K225 Diverticulum of esophagus, acquired: Principal | ICD-10-CM | POA: Diagnosis present

## 2018-10-25 DIAGNOSIS — Z8249 Family history of ischemic heart disease and other diseases of the circulatory system: Secondary | ICD-10-CM | POA: Diagnosis not present

## 2018-10-25 DIAGNOSIS — Z79899 Other long term (current) drug therapy: Secondary | ICD-10-CM | POA: Diagnosis not present

## 2018-10-25 DIAGNOSIS — M5136 Other intervertebral disc degeneration, lumbar region: Secondary | ICD-10-CM | POA: Diagnosis present

## 2018-10-25 DIAGNOSIS — K219 Gastro-esophageal reflux disease without esophagitis: Secondary | ICD-10-CM | POA: Diagnosis present

## 2018-10-25 DIAGNOSIS — E876 Hypokalemia: Secondary | ICD-10-CM | POA: Diagnosis present

## 2018-10-25 HISTORY — PX: ESOPHAGOGASTRODUODENOSCOPY: SHX5428

## 2018-10-25 LAB — TYPE AND SCREEN
ABO/RH(D): A POS
Antibody Screen: NEGATIVE

## 2018-10-25 SURGERY — FUNDOPLICATION, NISSEN, ROBOT-ASSISTED, LAPAROSCOPIC
Anesthesia: General | Site: Esophagus

## 2018-10-25 MED ORDER — PANTOPRAZOLE SODIUM 40 MG PO TBEC
40.0000 mg | DELAYED_RELEASE_TABLET | Freq: Every day | ORAL | Status: DC
  Administered 2018-10-26: 40 mg via ORAL
  Filled 2018-10-25: qty 1

## 2018-10-25 MED ORDER — KETAMINE HCL 10 MG/ML IJ SOLN
INTRAMUSCULAR | Status: DC | PRN
  Administered 2018-10-25: 30 mg via INTRAVENOUS

## 2018-10-25 MED ORDER — SODIUM CHLORIDE 0.9 % IV SOLN
INTRAVENOUS | Status: DC
  Administered 2018-10-25: 14:00:00 via INTRAVENOUS

## 2018-10-25 MED ORDER — SUCCINYLCHOLINE CHLORIDE 200 MG/10ML IV SOSY
PREFILLED_SYRINGE | INTRAVENOUS | Status: AC
  Filled 2018-10-25: qty 10

## 2018-10-25 MED ORDER — EPHEDRINE 5 MG/ML INJ
INTRAVENOUS | Status: AC
  Filled 2018-10-25: qty 10

## 2018-10-25 MED ORDER — ONDANSETRON 4 MG PO TBDP: 4.0000 mg | ORAL_TABLET | Freq: Four times a day (QID) | ORAL | Status: DC | PRN

## 2018-10-25 MED ORDER — ONDANSETRON HCL 4 MG/2ML IJ SOLN
INTRAMUSCULAR | Status: AC
  Filled 2018-10-25: qty 2

## 2018-10-25 MED ORDER — MEPERIDINE HCL 50 MG/ML IJ SOLN: 6.2500 mg | INTRAMUSCULAR | Status: DC | PRN

## 2018-10-25 MED ORDER — SIMETHICONE 80 MG PO CHEW: 40.0000 mg | CHEWABLE_TABLET | Freq: Four times a day (QID) | ORAL | Status: DC | PRN

## 2018-10-25 MED ORDER — ROCURONIUM BROMIDE 10 MG/ML (PF) SYRINGE
PREFILLED_SYRINGE | INTRAVENOUS | Status: AC
  Filled 2018-10-25: qty 10

## 2018-10-25 MED ORDER — POLYETHYLENE GLYCOL 3350 17 G PO PACK: 17.0000 g | PACK | Freq: Every day | ORAL | Status: DC | PRN

## 2018-10-25 MED ORDER — PHENYLEPHRINE 40 MCG/ML (10ML) SYRINGE FOR IV PUSH (FOR BLOOD PRESSURE SUPPORT)
PREFILLED_SYRINGE | INTRAVENOUS | Status: AC
  Filled 2018-10-25: qty 10

## 2018-10-25 MED ORDER — ONDANSETRON HCL 4 MG PO TABS: 4.0000 mg | ORAL_TABLET | Freq: Three times a day (TID) | ORAL | 5 refills | Status: DC | PRN

## 2018-10-25 MED ORDER — ACETAMINOPHEN 500 MG PO TABS
1000.0000 mg | ORAL_TABLET | ORAL | Status: AC
  Administered 2018-10-25: 1000 mg via ORAL
  Filled 2018-10-25: qty 2

## 2018-10-25 MED ORDER — GABAPENTIN 300 MG PO CAPS
300.0000 mg | ORAL_CAPSULE | ORAL | Status: AC
  Administered 2018-10-25: 300 mg via ORAL
  Filled 2018-10-25: qty 1

## 2018-10-25 MED ORDER — PHENYLEPHRINE HCL (PRESSORS) 10 MG/ML IV SOLN
INTRAVENOUS | Status: AC
  Filled 2018-10-25: qty 1

## 2018-10-25 MED ORDER — DIPHENHYDRAMINE HCL 50 MG/ML IJ SOLN: 12.5000 mg | Freq: Four times a day (QID) | INTRAMUSCULAR | Status: DC | PRN

## 2018-10-25 MED ORDER — LACTATED RINGERS IR SOLN
Status: DC | PRN
  Administered 2018-10-25: 2000 mL

## 2018-10-25 MED ORDER — ENOXAPARIN SODIUM 40 MG/0.4ML ~~LOC~~ SOLN
40.0000 mg | SUBCUTANEOUS | Status: DC
  Administered 2018-10-26 – 2018-10-28 (×3): 40 mg via SUBCUTANEOUS
  Filled 2018-10-25 (×3): qty 0.4

## 2018-10-25 MED ORDER — LIDOCAINE HCL (CARDIAC) PF 100 MG/5ML IV SOSY
PREFILLED_SYRINGE | INTRAVENOUS | Status: DC | PRN
  Administered 2018-10-25: 100 mg via INTRAVENOUS

## 2018-10-25 MED ORDER — TRAMADOL HCL 50 MG PO TABS: 50.0000 mg | ORAL_TABLET | Freq: Four times a day (QID) | ORAL | 0 refills | Status: DC | PRN

## 2018-10-25 MED ORDER — ESMOLOL HCL 100 MG/10ML IV SOLN
INTRAVENOUS | Status: AC
  Filled 2018-10-25: qty 10

## 2018-10-25 MED ORDER — TRAMADOL HCL 50 MG PO TABS: 50.0000 mg | ORAL_TABLET | Freq: Four times a day (QID) | ORAL | Status: DC | PRN

## 2018-10-25 MED ORDER — AMLODIPINE BESYLATE 5 MG PO TABS
5.0000 mg | ORAL_TABLET | Freq: Every day | ORAL | Status: DC
  Administered 2018-10-26 – 2018-10-28 (×3): 5 mg via ORAL
  Filled 2018-10-25 (×3): qty 1

## 2018-10-25 MED ORDER — METRONIDAZOLE IN NACL 5-0.79 MG/ML-% IV SOLN
500.0000 mg | INTRAVENOUS | Status: AC
  Administered 2018-10-25: 500 mg via INTRAVENOUS
  Filled 2018-10-25: qty 100

## 2018-10-25 MED ORDER — BUPIVACAINE-EPINEPHRINE (PF) 0.25% -1:200000 IJ SOLN
INTRAMUSCULAR | Status: AC
  Filled 2018-10-25: qty 60

## 2018-10-25 MED ORDER — SODIUM CHLORIDE 0.9 % IV SOLN
INTRAVENOUS | Status: DC | PRN
  Administered 2018-10-25: 25 ug/min via INTRAVENOUS

## 2018-10-25 MED ORDER — PROCHLORPERAZINE MALEATE 10 MG PO TABS: 10.0000 mg | ORAL_TABLET | Freq: Four times a day (QID) | ORAL | Status: DC | PRN

## 2018-10-25 MED ORDER — METHOCARBAMOL 500 MG PO TABS: 750.0000 mg | ORAL_TABLET | Freq: Four times a day (QID) | ORAL | Status: DC | PRN

## 2018-10-25 MED ORDER — 0.9 % SODIUM CHLORIDE (POUR BTL) OPTIME
TOPICAL | Status: DC | PRN
  Administered 2018-10-25: 11:00:00 2000 mL

## 2018-10-25 MED ORDER — LIDOCAINE 20MG/ML (2%) 15 ML SYRINGE OPTIME
INTRAMUSCULAR | Status: DC | PRN
  Administered 2018-10-25: 1.5 mg/kg/h via INTRAVENOUS

## 2018-10-25 MED ORDER — SUGAMMADEX SODIUM 200 MG/2ML IV SOLN
INTRAVENOUS | Status: AC
  Filled 2018-10-25: qty 2

## 2018-10-25 MED ORDER — HYDROMORPHONE HCL 1 MG/ML IJ SOLN: 0.5000 mg | INTRAMUSCULAR | Status: DC | PRN

## 2018-10-25 MED ORDER — PHENYLEPHRINE HCL (PRESSORS) 10 MG/ML IV SOLN
INTRAVENOUS | Status: DC | PRN
  Administered 2018-10-25 (×4): 80 ug via INTRAVENOUS

## 2018-10-25 MED ORDER — FENTANYL CITRATE (PF) 250 MCG/5ML IJ SOLN
INTRAMUSCULAR | Status: AC
  Filled 2018-10-25: qty 5

## 2018-10-25 MED ORDER — METOCLOPRAMIDE HCL 5 MG/ML IJ SOLN: 10.0000 mg | Freq: Once | INTRAMUSCULAR | Status: DC | PRN

## 2018-10-25 MED ORDER — SUGAMMADEX SODIUM 200 MG/2ML IV SOLN
INTRAVENOUS | Status: DC | PRN
  Administered 2018-10-25: 200 mg via INTRAVENOUS

## 2018-10-25 MED ORDER — GABAPENTIN 300 MG PO CAPS
300.0000 mg | ORAL_CAPSULE | Freq: Two times a day (BID) | ORAL | Status: DC
  Administered 2018-10-26 (×2): 300 mg via ORAL
  Filled 2018-10-25 (×3): qty 1

## 2018-10-25 MED ORDER — ROCURONIUM BROMIDE 10 MG/ML (PF) SYRINGE
PREFILLED_SYRINGE | INTRAVENOUS | Status: DC | PRN
  Administered 2018-10-25: 100 mg via INTRAVENOUS
  Administered 2018-10-25 (×2): 10 mg via INTRAVENOUS

## 2018-10-25 MED ORDER — LACTATED RINGERS IV SOLN
INTRAVENOUS | Status: DC
  Administered 2018-10-25: 06:00:00 via INTRAVENOUS

## 2018-10-25 MED ORDER — FENTANYL CITRATE (PF) 100 MCG/2ML IJ SOLN: 25.0000 ug | INTRAMUSCULAR | Status: DC | PRN

## 2018-10-25 MED ORDER — ALBUMIN HUMAN 5 % IV SOLN
12.5000 g | Freq: Four times a day (QID) | INTRAVENOUS | Status: AC | PRN
  Filled 2018-10-25: qty 250

## 2018-10-25 MED ORDER — BUPIVACAINE-EPINEPHRINE 0.25% -1:200000 IJ SOLN
INTRAMUSCULAR | Status: DC | PRN
  Administered 2018-10-25: 60 mL

## 2018-10-25 MED ORDER — HYDRALAZINE HCL 20 MG/ML IJ SOLN: 5.0000 mg | INTRAMUSCULAR | Status: DC | PRN

## 2018-10-25 MED ORDER — FENTANYL CITRATE (PF) 100 MCG/2ML IJ SOLN
INTRAMUSCULAR | Status: DC | PRN
  Administered 2018-10-25: 100 ug via INTRAVENOUS

## 2018-10-25 MED ORDER — PROPOFOL 10 MG/ML IV BOLUS
INTRAVENOUS | Status: AC
  Filled 2018-10-25: qty 20

## 2018-10-25 MED ORDER — SODIUM CHLORIDE 0.9 % IV SOLN
2.0000 g | INTRAVENOUS | Status: AC
  Administered 2018-10-25: 2 g via INTRAVENOUS
  Filled 2018-10-25: qty 20

## 2018-10-25 MED ORDER — ONDANSETRON HCL 4 MG/2ML IJ SOLN: 4.0000 mg | Freq: Four times a day (QID) | INTRAMUSCULAR | Status: DC | PRN

## 2018-10-25 MED ORDER — DEXAMETHASONE SODIUM PHOSPHATE 10 MG/ML IJ SOLN
INTRAMUSCULAR | Status: AC
  Filled 2018-10-25: qty 1

## 2018-10-25 MED ORDER — LIP MEDEX EX OINT
1.0000 "application " | TOPICAL_OINTMENT | Freq: Two times a day (BID) | CUTANEOUS | Status: DC
  Administered 2018-10-25 – 2018-10-27 (×5): 1 via TOPICAL
  Filled 2018-10-25 (×3): qty 7

## 2018-10-25 MED ORDER — ACETAMINOPHEN 500 MG PO TABS
1000.0000 mg | ORAL_TABLET | Freq: Three times a day (TID) | ORAL | Status: DC
  Administered 2018-10-25 – 2018-10-27 (×3): 1000 mg via ORAL
  Filled 2018-10-25 (×6): qty 2

## 2018-10-25 MED ORDER — LIDOCAINE HCL 2 % IJ SOLN
INTRAMUSCULAR | Status: AC
  Filled 2018-10-25: qty 20

## 2018-10-25 MED ORDER — METRONIDAZOLE IN NACL 5-0.79 MG/ML-% IV SOLN
500.0000 mg | Freq: Three times a day (TID) | INTRAVENOUS | Status: AC
  Administered 2018-10-25: 500 mg via INTRAVENOUS
  Filled 2018-10-25: qty 100

## 2018-10-25 MED ORDER — PROCHLORPERAZINE EDISYLATE 10 MG/2ML IJ SOLN: 5.0000 mg | Freq: Four times a day (QID) | INTRAMUSCULAR | Status: DC | PRN

## 2018-10-25 MED ORDER — MAGIC MOUTHWASH
15.0000 mL | Freq: Four times a day (QID) | ORAL | Status: DC | PRN
  Filled 2018-10-25: qty 15

## 2018-10-25 MED ORDER — ZOLPIDEM TARTRATE 5 MG PO TABS: 5.0000 mg | ORAL_TABLET | Freq: Every evening | ORAL | Status: DC | PRN

## 2018-10-25 MED ORDER — BISACODYL 10 MG RE SUPP: 10.0000 mg | Freq: Every day | RECTAL | Status: DC | PRN

## 2018-10-25 MED ORDER — DEXAMETHASONE SODIUM PHOSPHATE 10 MG/ML IJ SOLN
INTRAMUSCULAR | Status: DC | PRN
  Administered 2018-10-25: 10 mg via INTRAVENOUS

## 2018-10-25 MED ORDER — ACETAMINOPHEN 160 MG/5ML PO SOLN
1000.0000 mg | Freq: Two times a day (BID) | ORAL | Status: DC | PRN
  Administered 2018-10-25: 1000 mg via ORAL
  Filled 2018-10-25: qty 40.6

## 2018-10-25 MED ORDER — LACTATED RINGERS IV SOLN
INTRAVENOUS | Status: DC | PRN
  Administered 2018-10-25 (×2): via INTRAVENOUS

## 2018-10-25 MED ORDER — ONDANSETRON HCL 4 MG/2ML IJ SOLN
INTRAMUSCULAR | Status: DC | PRN
  Administered 2018-10-25: 4 mg via INTRAVENOUS

## 2018-10-25 MED ORDER — PROPOFOL 10 MG/ML IV BOLUS
INTRAVENOUS | Status: DC | PRN
  Administered 2018-10-25: 130 mg via INTRAVENOUS

## 2018-10-25 MED ORDER — METOPROLOL TARTRATE 5 MG/5ML IV SOLN: 5.0000 mg | Freq: Four times a day (QID) | INTRAVENOUS | Status: DC | PRN

## 2018-10-25 MED ORDER — KETAMINE HCL 10 MG/ML IJ SOLN
INTRAMUSCULAR | Status: AC
  Filled 2018-10-25: qty 1

## 2018-10-25 MED ORDER — DIPHENHYDRAMINE HCL 12.5 MG/5ML PO ELIX: 12.5000 mg | ORAL_SOLUTION | Freq: Four times a day (QID) | ORAL | Status: DC | PRN

## 2018-10-25 MED ORDER — ESMOLOL HCL 100 MG/10ML IV SOLN
INTRAVENOUS | Status: DC | PRN
  Administered 2018-10-25: 20 mg via INTRAVENOUS

## 2018-10-25 MED ORDER — EPHEDRINE SULFATE 50 MG/ML IJ SOLN
INTRAMUSCULAR | Status: DC | PRN
  Administered 2018-10-25 (×2): 10 mg via INTRAVENOUS

## 2018-10-25 MED ORDER — LIDOCAINE 2% (20 MG/ML) 5 ML SYRINGE
INTRAMUSCULAR | Status: AC
  Filled 2018-10-25: qty 5

## 2018-10-25 SURGICAL SUPPLY — 76 items
APPLICATOR COTTON TIP 6 STRL (MISCELLANEOUS) ×2 IMPLANT
APPLICATOR COTTON TIP 6IN STRL (MISCELLANEOUS) ×4
APPLIER CLIP 5 13 M/L LIGAMAX5 (MISCELLANEOUS)
APPLIER CLIP ROT 10 11.4 M/L (STAPLE)
BLADE SURG SZ11 CARB STEEL (BLADE) ×4 IMPLANT
CANNULA REDUC XI 12-8 STAPL (CANNULA) ×1
CANNULA REDUC XI 12-8MM STAPL (CANNULA) ×1
CANNULA REDUCER 12-8 DVNC XI (CANNULA) ×2 IMPLANT
CHLORAPREP W/TINT 26 (MISCELLANEOUS) ×4 IMPLANT
CLIP APPLIE 5 13 M/L LIGAMAX5 (MISCELLANEOUS) IMPLANT
CLIP APPLIE ROT 10 11.4 M/L (STAPLE) IMPLANT
COVER SURGICAL LIGHT HANDLE (MISCELLANEOUS) ×4 IMPLANT
COVER TIP SHEARS 8 DVNC (MISCELLANEOUS) ×2 IMPLANT
COVER TIP SHEARS 8MM DA VINCI (MISCELLANEOUS) ×2
COVER WAND RF STERILE (DRAPES) ×4 IMPLANT
DECANTER SPIKE VIAL GLASS SM (MISCELLANEOUS) ×4 IMPLANT
DRAIN CHANNEL 19F RND (DRAIN) ×4 IMPLANT
DRAIN PENROSE 18X1/2 LTX STRL (DRAIN) IMPLANT
DRAPE ARM DVNC X/XI (DISPOSABLE) ×8 IMPLANT
DRAPE COLUMN DVNC XI (DISPOSABLE) ×2 IMPLANT
DRAPE DA VINCI XI ARM (DISPOSABLE) ×8
DRAPE DA VINCI XI COLUMN (DISPOSABLE) ×2
DRAPE WARM FLUID 44X44 (DRAPE) ×4 IMPLANT
DRSG TEGADERM 2-3/8X2-3/4 SM (GAUZE/BANDAGES/DRESSINGS) ×20 IMPLANT
DRSG TEGADERM 4X4.75 (GAUZE/BANDAGES/DRESSINGS) ×4 IMPLANT
ELECT REM PT RETURN 15FT ADLT (MISCELLANEOUS) ×4 IMPLANT
ENDOLOOP SUT PDS II  0 18 (SUTURE)
ENDOLOOP SUT PDS II 0 18 (SUTURE) IMPLANT
EVACUATOR DRAINAGE 10X20 100CC (DRAIN) IMPLANT
EVACUATOR SILICONE 100CC (DRAIN) ×4 IMPLANT
FELT TEFLON 4 X1 (Mesh General) ×4 IMPLANT
GAUZE SPONGE 2X2 8PLY STRL LF (GAUZE/BANDAGES/DRESSINGS) ×2 IMPLANT
GLOVE ECLIPSE 8.0 STRL XLNG CF (GLOVE) ×8 IMPLANT
GLOVE INDICATOR 8.0 STRL GRN (GLOVE) ×8 IMPLANT
GOWN STRL REUS W/TWL XL LVL3 (GOWN DISPOSABLE) ×12 IMPLANT
IRRIG SUCT STRYKERFLOW 2 WTIP (MISCELLANEOUS) ×4
IRRIGATION SUCT STRKRFLW 2 WTP (MISCELLANEOUS) ×2 IMPLANT
KIT BASIN OR (CUSTOM PROCEDURE TRAY) ×4 IMPLANT
KIT TURNOVER KIT A (KITS) IMPLANT
NEEDLE HYPO 22GX1.5 SAFETY (NEEDLE) ×4 IMPLANT
NEEDLE INSUFFLATION 14GA 120MM (NEEDLE) ×4 IMPLANT
PACK CARDIOVASCULAR III (CUSTOM PROCEDURE TRAY) ×4 IMPLANT
PAD POSITIONING PINK XL (MISCELLANEOUS) ×4 IMPLANT
POUCH RETRIEVAL ECOSAC 10 (ENDOMECHANICALS) ×2 IMPLANT
POUCH RETRIEVAL ECOSAC 10MM (ENDOMECHANICALS) ×2
SCISSORS LAP 5X45 EPIX DISP (ENDOMECHANICALS) IMPLANT
SEAL CANN UNIV 5-8 DVNC XI (MISCELLANEOUS) ×8 IMPLANT
SEAL XI 5MM-8MM UNIVERSAL (MISCELLANEOUS) ×8
SEALER VESSEL DA VINCI XI (MISCELLANEOUS) ×2
SEALER VESSEL EXT DVNC XI (MISCELLANEOUS) ×2 IMPLANT
SOLUTION ELECTROLUBE (MISCELLANEOUS) ×4 IMPLANT
SPONGE GAUZE 2X2 STER 10/PKG (GAUZE/BANDAGES/DRESSINGS) ×2
SPONGE LAP 18X18 RF (DISPOSABLE) ×4 IMPLANT
STAPLER 45 BLU RELOAD XI (STAPLE) ×2 IMPLANT
STAPLER 45 BLUE RELOAD XI (STAPLE) ×2
STAPLER CANNULA SEAL DVNC XI (STAPLE) ×2 IMPLANT
STAPLER CANNULA SEAL XI (STAPLE) ×2
STAPLER SHEATH (SHEATH) ×2
STAPLER SHEATH ENDOWRIST DVNC (SHEATH) ×2 IMPLANT
SUT ETHIBOND 0 36 GRN (SUTURE) ×8 IMPLANT
SUT ETHIBOND NAB CT1 #1 30IN (SUTURE) ×8 IMPLANT
SUT MNCRL AB 4-0 PS2 18 (SUTURE) ×4 IMPLANT
SUT PROLENE 2 0 SH DA (SUTURE) ×4 IMPLANT
SUT VLOC 180 2-0 9IN GS21 (SUTURE) IMPLANT
SYR 10ML LL (SYRINGE) ×4 IMPLANT
SYR 20CC LL (SYRINGE) ×4 IMPLANT
TIP INNERVISION DETACH 40FR (MISCELLANEOUS) IMPLANT
TIP INNERVISION DETACH 50FR (MISCELLANEOUS) IMPLANT
TIP INNERVISION DETACH 56FR (MISCELLANEOUS) IMPLANT
TIPS INNERVISION DETACH 40FR (MISCELLANEOUS)
TOWEL OR 17X26 10 PK STRL BLUE (TOWEL DISPOSABLE) ×4 IMPLANT
TOWEL OR NON WOVEN STRL DISP B (DISPOSABLE) ×4 IMPLANT
TRAY FOLEY MTR SLVR 14FR STAT (SET/KITS/TRAYS/PACK) IMPLANT
TRAY FOLEY MTR SLVR 16FR STAT (SET/KITS/TRAYS/PACK) IMPLANT
TROCAR ADV FIXATION 5X100MM (TROCAR) ×4 IMPLANT
TUBING INSUFFLATION 10FT LAP (TUBING) ×4 IMPLANT

## 2018-10-25 NOTE — Transfer of Care (Signed)
Immediate Anesthesia Transfer of Care Note  Patient: Shannon Ortiz  Procedure(s) Performed: ROBOTIC HIATAL HERNIA REPAIR ESOPHAGEAL EPIPHRENIC DIVERTICULECTOMY, HELLER CARDIOMYOTOMY, TOUPET FUNDOPLICATION (N/A Abdomen) ESOPHAGOGASTRODUODENOSCOPY (EGD) (N/A Esophagus)  Patient Location: PACU  Anesthesia Type:General  Level of Consciousness: drowsy, patient cooperative and responds to stimulation  Airway & Oxygen Therapy: Patient Spontanous Breathing and Patient connected to face mask oxygen  Post-op Assessment: Report given to RN and Post -op Vital signs reviewed and stable  Post vital signs: Reviewed and stable  Last Vitals:  Vitals Value Taken Time  BP 120/54 10/25/2018 11:38 AM  Temp    Pulse 78 10/25/2018 11:41 AM  Resp 22 10/25/2018 11:41 AM  SpO2 100 % 10/25/2018 11:41 AM  Vitals shown include unvalidated device data.  Last Pain:  Vitals:   10/25/18 0539  TempSrc: Oral      Patients Stated Pain Goal: 4 (97/53/00 5110)  Complications: No apparent anesthesia complications

## 2018-10-25 NOTE — Op Note (Signed)
10/25/2018  11:55 AM  PATIENT:  Shannon Ortiz  83 y.o. female  Patient Care Team: Lawerance Cruel, MD as PCP - General (Family Medicine) Michael Boston, MD as Consulting Physician (General Surgery) Ronnette Juniper, MD as Consulting Physician (Gastroenterology)  PRE-OPERATIVE DIAGNOSIS:  BENIGN ESOPHAGEAL STRICTURE WITH EPIPHRENIC DISTAL ESOPHAGEAL DIVERTICULUM  POST-OPERATIVE DIAGNOSIS:   BENIGN ESOPHAGEAL STRICTURE EPIPHRENIC DISTAL ESOPHAGEAL DIVERTICULUM HIATAL HERNIA  PROCEDURE:   ESOPHAGEAL EPIPHRENIC DIVERTICULECTOMY HELLER CARDIOMYOTOMY TOUPET FUNDOPLICATION (PARTIAL POSTERIOR 240 DEGREE WRAP) ROBOTIC HIATAL HERNIA REPAIR  ESOPHAGOGASTRODUODENOSCOPY (EGD)  SURGEON:  Adin Hector, MD  ASSIST: Stark Klein, MD  ANESTHESIA:   local and general  EBL:  Total I/O In: 7026 [I.V.:2150; IV Piggyback:200] Out: 43 [Blood:50]  Delay start of Pharmacological VTE agent (>24hrs) due to surgical blood loss or risk of bleeding:  no  ANESTHESIA: 1. General anesthesia. 2. Local anesthetic in a field block around all port sites.  SPECIMEN:  Mediastinal hernia sac (not sent).  DRAINS:  A 19-French Blake drain goes from the left upper quadrant along the lesser curvature of the stomach into the mediastinum.  COUNTS:  YES  PLAN OF CARE: Admit to inpatient   PATIENT DISPOSITION:  PACU - hemodynamically stable.  INDICATION:   Pleasant only woman with worsening episodes of dysphasia to solids and occasionally full liquids.  History of distal benign esophageal stricture with prior dilatations and Botox injections.  Latest evaluation showed evidence of a pouching on the distal esophagus.  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  Given dietary and endoscopic options exhausted and still struggling with readmissions with vomiting and unintentional weight loss, surgical consultation made.  Manometry shows some mild dysmotility but certainly no achalasia.  I  recommended surgery to remove the diverticulum with cardiomyopathy to release the stricture of the esophagus and a partial wrap to help protect the myotomy and provide some heartburn control.  Discussion made with the patient and her daughter repeatedly.  Despite her advanced age she is relatively active and wished to be aggressive and proceed with surgery since she is exhausted other options.  Recently readmitted, so I did not feel was safe to delay surgery further despite the COVID pandemic The patient has had extensive work-up & is felt to benefit from repair:  The anatomy & physiology of the foregut and anti-reflux mechanism was discussed.  The pathophysiology of  stricture and epiphrenic diverticulum was discussed.  Natural history risks without surgery was discussed.   The patient's symptoms are not adequately controlled by non-operative treatments.  I feel the risks of no intervention will lead to serious problems that outweigh the operative risks; therefore, I recommended surgery to relax the fibrotic LES &  rebuild the anti-reflux valve to control reflux.  Need for a thorough workup to rule out the differential diagnosis and plan treatment was explained.  I explained laparoscopic techniques with possible need for an open approach.  Risks such as bleeding, infection, abscess, leak, need for further treatment, heart attack, death, and other risks were discussed.  I noted a good likelihood this will help address the problem.   Goals of post-operative recovery were discussed as well.  Possibility that this will not correct all symptoms was explained.  Post-operative dysphagia, need for short-term liquid & pureed diet, inability to vomit, possibility of recurrence needing further treatment, possible need for medicines to help control symptoms in addition to surgery were discussed.  We will work to minimize complications.   Educational handouts further explaining the pathology,  treatment options, and  dysphagia diet was given as well.  Questions were answered.  The patient expresses understanding & wishes to proceed with surgery.  OR FINDINGS:   Patient had a broad-based distal esophageal diverticulum on the right lateral aspect.  This was stapled off.  Contralateral myotomy done in the left lateral aspect.  The myotomy is 6 cm long on the distal esophagus & 3 cm long on the cardia/anterior stomach.  Small sliding hiatal hernia.  Primary crural repair done  The patient has a 3-4 cm Toupet (posterior 161 deg) fundoplication.  That covers both of the right lateral stapled off esophageal diverticulum as well as the left lateral myotomy loosely.  The patient has had bilateral anterior and posterior gastropexies.  DESCRIPTION:   Informed consent was confirmed.  The patient received IV antibiotics prior to incision.  The underwent general anesthesia without difficulty.  A Foley catheter was sterilely placed.  The patient was positioned in split leg with arms tucked.  The abdomen was prepped and draped in the sterile fashion.  Surgical time-out confirmed our plan.  I used a Varess technique in the left subcostal region.  No blood & flushed well.  Induced capnoperitoneum.  After 15 mmHg pressure, an 51mm Xi port was placed in the left lateral upper quadrant.  Entry was clean.  Inspection revealed no injury.  Additional 8 mm Exira robotic ports were placed in the upper abdomen.  I also placed a 5 mm port in the left subxiphoid region under direct visualization.  I removed that and placed an Omega-shaped rigid Nathanson liver retractor to lift the left lateral sector of the liver anteriorly to expose the esophageal hiatus.  This was secured to the bed using the iron man system.  I placed another 46mm port in the RLQ. Robot was carefully docked.  We inspected the epigastric region.  Could see an obvious but small hiatal hernia.  Placed the stomach and esophagus on axial tension and transected through the  right lateral thin hepatogastric ligament.  With that I could get into the anterior mediastinum between the right crus and esophagus.  I used primarily focused gentle blunt dissection as well as focused vessel sealer dissection.  I transected phrenoesophageal attachments to the inner right crus sac until I found the base of the crura.  I then came around anteriorly on the left side and freed up the phrenoesophageal attachments on the medial part of the left crus on the superior half.   We did see some bulging of the distal esophagus consistent with the epiphrenic diverticulum.  We left that in place for now  We ligated the short gastrics along the lesser curvature of the stomach about a third way down and then came up over the fundus.  We released the attachments of the stomach to the retroperitoneum until we were able to connect with the prior dissection on the left crus.  We completed the release of phrenoesophageal attachments to the medial part of the left crus down to its base.  With this, we had circumferential mobilization.    We placed the stomach and esophagus on axial tension.  I then did a type 2 mediastinal dissection where I freed the esophagus from its attachments to the aorta, pleura, and pericardium using primarily gentle blunt as well as focused ultrasonic dissection.  We saw the anterior vagus nerve & preserved and left anteriorly.  The posterior vagus nerve was intact.  We preserved it at all times.  With that  I could straighten out the esophagus and get 5 cm of intra-abdominal length of the esophagus at a best estimation.    Next I focused on the epiphrenic diverticulum in the distal esophagus.  Came through the right lateral distal esophageal muscle layers to expose an obviously ballooned diverticulum.  No meticulous myotomy to get down to the level of the mucosa of the diverticulum.  Took some time to help free that off.  The right posterior vagus ran along very close to it.  Carefully  skeletonized and mobilize that off.  Became apparent that it was actually quite a broad-based diverticulum with a several centimeter base.  Ended up entering into the diverticulum anteriorly to better ascertain the anatomy and prove a broad-based diverticulum.  Once we had that completely skeletonized I stapled it off parallel to the esophagus with a single 45 mm blue load robotic stapler longitudinally in a coronal plane, staying away from the vagus.  Placed in EcoSac bag and removed it.  Focused on myotomy on the contralateral side to keep tension off the stapled distal esophagus.  I mobilized the anterior vagus nerve off the esophagus as it crossed over more towards the left more proximally.  This help expose the anterior esophagus and anterior cardia.  I dissected out & removed the anterior epiphrenic pad.  Posterior epiphrenic fat was skeletonized and freed off as well.  With that, I could better define the esophagogastric junction.  I brought the fundus of the stomach posterior to the esophagus over to the right side.  The wrap was mobile with the classic shoe shinebmaneuver.  Wrap became together gently.  We then proceeded with myotomy using focused blunt dissection and hook cautery.  I focused on the left lateral aspect, 180 degrees away from the staple line.  I carefully split the longitudinal fibers of the proximal cardia of the stomach near the angle of His and headed more proximally up to the distal esophagus.  I came underneath the circular ring fibers of the esophagus.  They were rather fibrotic.  I freed off the mucosa.  Transected across them using some gentle avulsion technique as well as focused hook dissection.  We continued more proximally up above the esophageal hiatus.  This took some time as there was some moderate scarring consistent with his prior dilations and Botox injections.  Distal esophageal and proximal cardia muscle fibers were quite thickened and fibrotic, implying most  strictured area.  I carried the myotomy approximately until he came to some esophagus that was less thickened and left inflamed.  Measure the length of myotomy and it was 6 cm on the esophagus and 3 cm onto the cardia.  I meticulously reinspected.  There is no evidence of perforation.  Bridging vessels on the mucosa were carefully elevated and transected.  I direction visualization with good robotic camera work.  I then robotically clamped off the ligament of Treitz to the proximal jejunum and proceeded with EGD.  Was able pass the endoscope transorally into the proximal esophagus easily.  Esophagus was somewhat dilated and boggy but was able to follow down distally until I came to the Z line around 45 cm from the incisors.  I felt no resistance or stricturing.  Came into the stomach which was viable.  We insufflated the stomach with irrigation and saw no bubbling, consistent with a negative air leak test.  Was able to come back and noticed mild bruising in the distal esophagus but no narrowing or stricturing at the  staple line.  I saw no evidence of perforation or leak.  Went back and aspirated the gas of the stomach to desufflated as well as esophagus on the way out.    Then returned towards hiatal closure and fundoplication.  We then reflected the stomach left laterally and closed the esophageal hiatus using 0 Ethibond stitch on pledgets using horizontal mattress sutures.  I did that x2 stitches.  I did did one more horizontal mattress suture without pledgets to help close the hiatus down snugly but not too tightly.  I then did a posterior gastropexy of the wrap using #1 Ethibond sutures through the posterior wall of the wrap in and out of the hiatal closure.  Did that times 2 sutures.  Did another stitch on the posterior wrap of the stomach to the right anterior crus for a right anterior posterior gastropexy.  That made sure the stomach was at least a centimeter proximal to the most proximal staple line of  the esophagus to protect that.  Then proceeded with a partial posterior fundoplication.  Continued from the right anterior gastropexy.  Did interrupted 0 Ethibond sutures of the right part of the wrap to the esophagus just anterior to the right lateral esophageal mucosa staple line of the diverticulectomy where the muscle was intact.  4 stitches total on the right side.  I then did a left anterior gastropexy taking the apex of the left side of the wrap and tacked it to the left anterior crura just anterior to the left lateral myotomy to help protect the mucosa of that myotomy..  I tied that down. I did 3 more stitches distally, taking a bite of the side of the wrap with a myotomy for a posterior Toupet fundoplication.  That with the myotomy protected the staple line on the right lateral side and the myotomy in the left lateral side.  The posterior wrap was loose and not stricturing.    Hemostasis was good.  We did irrigation.  We directed a 15 [place] Blake drain through the left upper quadrant stapler port site into the right anterior hiatal opening where was nice and loose with the tip in the mediastinum.  The drain then ran along the lesser curvature the stomach to exit out of the left upper quadrant 12 mm stapler port site.  The robot was safely undocked.  I removed the [facility] liver retractor under direct visualization.  Hemostasis was good.I evacuated carbon dioxide and removed the ports.  Drain secured with 2-0 Prolene vertical mattress suture the skin was closed with Monocryl and sterile dressings applied.  The patient is being extubated and brought back to the recovery room.  I discussed postop care in detail with the patient and family in in the office.  Discussed again with the patient in the holding area.  Instructions are written. I discussed operative findings, updated the patient's status, discussed probable steps to recovery, and gave postoperative recommendations to the Patient's daughter,  [name]..  Recommendations were made.  Questions were answered.  She expressed understanding & appreciation.   Adin Hector, M.D., F.A.C.S. Gastrointestinal and Minimally Invasive Surgery [facility] [address] [place], [place] [address] [phone] Main / Paging

## 2018-10-25 NOTE — Interval H&P Note (Signed)
History and Physical Interval Note:  10/25/2018 7:16 AM  Shannon Ortiz  has presented today for surgery, with the diagnosis of [place].  The various methods of treatment have been discussed with the patient and family. After consideration of risks, benefits and other options for treatment, the patient has consented to  Procedure(s): ROBOTIC RESECTION OF ESOPHAGEAL DIVERTICULUM, ESOPHAGEAL CARDIOMYOTOMY, FUNDOPLICATION, (N/A) ESOPHAGOGASTRODUODENOSCOPY (EGD) (N/A) as a surgical intervention.  The patient's history has been reviewed, patient examined, no change in status, stable for surgery.  I have reviewed the patient's chart and labs.  Questions were answered to the patient's satisfaction.    The anatomy & physiology of the foregut and anti-reflux mechanism was discussed.  The pathophysiology of achalasia was discussed.  Natural history risks without surgery was discussed.   The patient's symptoms are not adequately controlled by non-operative treatments.  I feel the risks of no intervention will lead to serious problems that outweigh the operative risks; therefore, I recommended surgery to relax the fibrotic LES, remove the esophageal diverticulum, & rebuild the anti-reflux valve to control reflux.  Need for a thorough workup to rule out the differential diagnosis and plan treatment was explained.  I explained laparoscopic techniques with possible need for an open approach.  Risks such as bleeding, infection, abscess, leak, injury to other organs, need for repair of tissues / organs, need for further treatment, heart attack, death, and other risks were discussed.  I noted a good likelihood this will help address the problem.   Goals of post-operative recovery were discussed as well.  Possibility that this will not correct all symptoms was explained.  Post-operative dysphagia, need for short-term liquid & pureed diet, inability to vomit,  possibility of recurrence needing further treatment, possible need for medicines to help control symptoms in addition to surgery were discussed.  We will work to minimize complications.   Educational handouts further explaining the pathology, treatment options, and dysphagia diet was given as well.  Questions were answered.  The patient expresses understanding & wishes to proceed with surgery.     Adin Hector

## 2018-10-25 NOTE — Anesthesia Procedure Notes (Signed)
Procedure Name: Intubation Date/Time: 10/25/2018 7:44 AM Performed by: Glory Buff, CRNA Pre-anesthesia Checklist: Patient identified, Emergency Drugs available, Suction available and Patient being monitored Patient Re-evaluated:Patient Re-evaluated prior to induction Oxygen Delivery Method: Circle system utilized Preoxygenation: Pre-oxygenation with 100% oxygen Induction Type: IV induction Ventilation: Mask ventilation without difficulty Laryngoscope Size: Miller and 3 Grade View: Grade II Tube type: Oral Tube size: 7.0 mm Number of attempts: 1 Airway Equipment and Method: Stylet and Oral airway Placement Confirmation: ETT inserted through vocal cords under direct vision,  positive ETCO2 and breath sounds checked- equal and bilateral Secured at: 19 cm Tube secured with: Tape Dental Injury: Teeth and Oropharynx as per pre-operative assessment

## 2018-10-25 NOTE — Anesthesia Postprocedure Evaluation (Signed)
Anesthesia Post Note  Patient: Shannon Ortiz  Procedure(s) Performed: ROBOTIC HIATAL HERNIA REPAIR ESOPHAGEAL EPIPHRENIC DIVERTICULECTOMY, HELLER CARDIOMYOTOMY, TOUPET FUNDOPLICATION (N/A Abdomen) ESOPHAGOGASTRODUODENOSCOPY (EGD) (N/A Esophagus)     Patient location during evaluation: PACU Anesthesia Type: General Level of consciousness: awake and alert Pain management: pain level controlled Vital Signs Assessment: post-procedure vital signs reviewed and stable Respiratory status: spontaneous breathing, nonlabored ventilation, respiratory function stable and patient connected to nasal cannula oxygen Cardiovascular status: blood pressure returned to baseline and stable Postop Assessment: no apparent nausea or vomiting Anesthetic complications: no    Last Vitals:  Vitals:   10/25/18 1245 10/25/18 1308  BP: 114/62 (!) 121/56  Pulse: 66   Resp: 17 16  Temp:  36.4 C  SpO2: 99% 98%    Last Pain:  Vitals:   10/25/18 1308  TempSrc: Oral  PainSc: 2                  Montez Hageman

## 2018-10-26 ENCOUNTER — Inpatient Hospital Stay (HOSPITAL_COMMUNITY): Payer: Medicare Other

## 2018-10-26 ENCOUNTER — Encounter (HOSPITAL_COMMUNITY): Payer: Self-pay | Admitting: Surgery

## 2018-10-26 MED ORDER — DEXAMETHASONE SODIUM PHOSPHATE 4 MG/ML IJ SOLN
4.0000 mg | Freq: Two times a day (BID) | INTRAMUSCULAR | Status: DC
  Administered 2018-10-26 – 2018-10-28 (×5): 4 mg via INTRAVENOUS
  Filled 2018-10-26 (×5): qty 1

## 2018-10-26 MED ORDER — FUROSEMIDE 10 MG/ML IJ SOLN
40.0000 mg | Freq: Once | INTRAMUSCULAR | Status: AC
  Administered 2018-10-26: 40 mg via INTRAVENOUS
  Filled 2018-10-26: qty 4

## 2018-10-26 MED ORDER — SODIUM CHLORIDE 0.9% FLUSH
3.0000 mL | INTRAVENOUS | Status: DC | PRN
  Administered 2018-10-26: 3 mL via INTRAVENOUS
  Filled 2018-10-26: qty 3

## 2018-10-26 MED ORDER — SODIUM CHLORIDE 0.9% FLUSH
3.0000 mL | Freq: Two times a day (BID) | INTRAVENOUS | Status: DC
  Administered 2018-10-26 – 2018-10-28 (×5): 3 mL via INTRAVENOUS

## 2018-10-26 MED ORDER — SODIUM CHLORIDE 0.9 % IV SOLN: 250.0000 mL | INTRAVENOUS | Status: DC | PRN

## 2018-10-26 NOTE — Progress Notes (Signed)
Shannon Ortiz [id number] February 05, 1936  CARE TEAM:  PCP: Lawerance Cruel, MD  Outpatient Care Team: Patient Care Team: Lawerance Cruel, MD as PCP - General (Family Medicine) Michael Boston, MD as Consulting Physician (General Surgery) Ronnette Juniper, MD as Consulting Physician (Gastroenterology)  Inpatient Treatment Team: Treatment Team: Attending Provider: Michael Boston, MD; Registered Nurse: Arminda Resides, RN; Registered Nurse: Charlyne Petrin, RN   Problem List:   Principal Problem:   Acquired diverticulum of distal esophagus s/p robotic excision & myotomy 10/25/2018 Active Problems:   HTN (hypertension)   Esophageal stricture   Hiatal hernia s/p robotic repair 10/25/2018   Status post robotic Toupet (posterior partial) fundoplication 0/27/7412   1 Day Post-Op  10/25/2018  POST-OPERATIVE DIAGNOSIS:   BENIGN ESOPHAGEAL STRICTURE EPIPHRENIC DISTAL ESOPHAGEAL DIVERTICULUM HIATAL HERNIA  PROCEDURE:   ESOPHAGEAL EPIPHRENIC DIVERTICULECTOMY HELLER CARDIOMYOTOMY TOUPET FUNDOPLICATION (PARTIAL POSTERIOR 240 DEGREE WRAP) ROBOTIC HIATAL HERNIA REPAIR  ESOPHAGOGASTRODUODENOSCOPY (EGD)  SURGEON:  Adin Hector, MD    Assessment  Recovering  [facility] Stay = 1 days)  Plan:  -medlock IVF -Lasix x1 diuresis -clears low vol OK.  Esophagram this AM.  If no leak/obstruction, ADAT Dys1 -keep drain for now -HTN control - PRN backup -PT/OT evals to make sure OK to transition back home (uses occasional cane) -VTE prophylaxis- SCDs, etc -mobilize as tolerated to help recovery  20 minutes spent in review, evaluation, examination, counseling, and coordination of care.  More than 50% of that time was spent in counseling.  10/26/2018    Subjective: (Chief complaint)  Denies pain C/o mucus in throat tol sips of clears RN just outside room  Objective:  Vital signs:  Vitals:   10/25/18 1500 10/25/18 2107 10/26/18 0146 10/26/18 0548  BP: 126/66 (!) 152/92  139/65 127/69  Pulse: 66 73 83 86  Resp: 16 18 20 16   Temp: 98.3 F (36.8 C) 98.1 F (36.7 C) 98.2 F (36.8 C) 98.7 F (37.1 C)  TempSrc: Oral Oral Oral Oral  SpO2: 99% 100% 100% 100%  Weight:      Height:           Intake/Output   Yesterday:  05/12 0701 - 05/13 0700 In: 4030.5 [P.O.:600; I.V.:3130.5; IV Piggyback:300] Out: 360 [Urine:300; Drains:10; Blood:50] This shift:  No intake/output data recorded.  Bowel function:  Flatus: No  BM:  No  Drain: Serosanguinous   Physical Exam:  General: Pt awake/alert/oriented x4 in no acute distress Eyes: PERRL, normal EOM.  Sclera clear.  No icterus Neuro: CN II-XII intact w/o focal sensory/motor deficits. Lymph: No head/neck/groin lymphadenopathy Psych:  No delerium/psychosis/paranoia HENT: Normocephalic, Mucus membranes moist.  No thrush Neck: Supple, No tracheal deviation Chest: No chest wall pain w good excursion CV:  Pulses intact.  Regular rhythm MS: Normal AROM mjr joints.  No obvious deformity  Abdomen: Soft.  Nondistended.  Nontender.  No evidence of peritonitis.  No incarcerated hernias.  Ext:  No deformity.  No mjr edema.  No cyanosis Skin: No petechiae / purpura  Results:   Cultures: Recent Results (from the past 720 hour(s))  SARS Coronavirus 2 (CEPHEID - Performed in [place] hospital lab), Hosp Order     Status: None   Collection Time: 10/15/18  1:39 PM  Result Value Ref Range Status   SARS Coronavirus 2 NEGATIVE NEGATIVE Final    Comment: (NOTE) If result is NEGATIVE SARS-CoV-2 target nucleic acids are NOT DETECTED. The SARS-CoV-2 RNA is generally detectable in upper and lower  respiratory specimens  during the acute phase of infection. The lowest  concentration of SARS-CoV-2 viral copies this assay can detect is 250  copies / mL. A negative result does not preclude SARS-CoV-2 infection  and should not be used as the sole basis for treatment or other  patient management decisions.  A negative  result may occur with  improper specimen collection / handling, submission of specimen other  than nasopharyngeal swab, presence of viral mutation(s) within the  areas targeted by this assay, and inadequate number of viral copies  (<250 copies / mL). A negative result must be combined with clinical  observations, patient history, and epidemiological information. If result is POSITIVE SARS-CoV-2 target nucleic acids are DETECTED. The SARS-CoV-2 RNA is generally detectable in upper and lower  respiratory specimens dur ing the acute phase of infection.  Positive  results are indicative of active infection with SARS-CoV-2.  Clinical  correlation with patient history and other diagnostic information is  necessary to determine patient infection status.  Positive results do  not rule out bacterial infection or co-infection with other viruses. If result is PRESUMPTIVE POSTIVE SARS-CoV-2 nucleic acids MAY BE PRESENT.   A presumptive positive result was obtained on the submitted specimen  and confirmed on repeat testing.  While 2019 novel coronavirus  (SARS-CoV-2) nucleic acids may be present in the submitted sample  additional confirmatory testing may be necessary for epidemiological  and / or clinical management purposes  to differentiate between  SARS-CoV-2 and other Sarbecovirus currently known to infect humans.  If clinically indicated additional testing with an alternate test  methodology [phone]) is advised. The SARS-CoV-2 RNA is generally  detectable in upper and lower respiratory sp ecimens during the acute  phase of infection. The expected result is Negative. Fact Sheet for Patients:  StrictlyIdeas.no Fact Sheet for Healthcare Providers: BankingDealers.co.za This test is not yet approved or cleared by the [place] FDA and has been authorized for detection and/or diagnosis of SARS-CoV-2 by FDA under an Emergency Use Authorization  (EUA).  This EUA will remain in effect (meaning this test can be used) for the duration of the COVID-19 declaration under Section 564(b)(1) of the Act, 21 U.S.C. section 360bbb-3(b)(1), unless the authorization is terminated or revoked sooner. Performed at [facility], [place] [address] [place], [place] [address]   SARS Coronavirus 2 (CEPHEID - Performed in [place] hospital lab), Hosp Order     Status: None   Collection Time: 10/24/18  8:42 AM  Result Value Ref Range Status   SARS Coronavirus 2 NEGATIVE NEGATIVE Final    Comment: (NOTE) If result is NEGATIVE SARS-CoV-2 target nucleic acids are NOT DETECTED. The SARS-CoV-2 RNA is generally detectable in upper and lower  respiratory specimens during the acute phase of infection. The lowest  concentration of SARS-CoV-2 viral copies this assay can detect is 250  copies / mL. A negative result does not preclude SARS-CoV-2 infection  and should not be used as the sole basis for treatment or other  patient management decisions.  A negative result may occur with  improper specimen collection / handling, submission of specimen other  than nasopharyngeal swab, presence of viral mutation(s) within the  areas targeted by this assay, and inadequate number of viral copies  (<250 copies / mL). A negative result must be combined with clinical  observations, patient history, and epidemiological information. If result is POSITIVE SARS-CoV-2 target nucleic acids are DETECTED. The SARS-CoV-2 RNA is generally detectable in upper and lower  respiratory specimens  dur ing the acute phase of infection.  Positive  results are indicative of active infection with SARS-CoV-2.  Clinical  correlation with patient history and other diagnostic information is  necessary to determine patient infection status.  Positive results do  not rule out bacterial infection or co-infection with other viruses. If result is PRESUMPTIVE POSTIVE SARS-CoV-2 nucleic acids  MAY BE PRESENT.   A presumptive positive result was obtained on the submitted specimen  and confirmed on repeat testing.  While 2019 novel coronavirus  (SARS-CoV-2) nucleic acids may be present in the submitted sample  additional confirmatory testing may be necessary for epidemiological  and / or clinical management purposes  to differentiate between  SARS-CoV-2 and other Sarbecovirus currently known to infect humans.  If clinically indicated additional testing with an alternate test  methodology [phone]) is advised. The SARS-CoV-2 RNA is generally  detectable in upper and lower respiratory sp ecimens during the acute  phase of infection. The expected result is Negative. Fact Sheet for Patients:  StrictlyIdeas.no Fact Sheet for Healthcare Providers: BankingDealers.co.za This test is not yet approved or cleared by the [place] FDA and has been authorized for detection and/or diagnosis of SARS-CoV-2 by FDA under an Emergency Use Authorization (EUA).  This EUA will remain in effect (meaning this test can be used) for the duration of the COVID-19 declaration under Section 564(b)(1) of the Act, 21 U.S.C. section 360bbb-3(b)(1), unless the authorization is terminated or revoked sooner. Performed at [facility], [place] [address] [place], [place] [address]     Labs: Results for orders placed or performed during the hospital encounter of 10/24/18 (from the past 48 hour(s))  CBC     Status: Abnormal   Collection Time: 10/24/18  2:42 PM  Result Value Ref Range   WBC 4.9 4.0 - 10.5 K/uL   RBC 4.15 3.87 - 5.11 MIL/uL   Hemoglobin 9.7 (L) 12.0 - 15.0 g/dL   HCT 29.8 (L) 36.0 - 46.0 %   MCV 71.8 (L) 80.0 - 100.0 fL   MCH 23.4 (L) 26.0 - 34.0 pg   MCHC 32.6 30.0 - 36.0 g/dL   RDW 18.1 (H) 11.5 - 15.5 %   Platelets 243 150 - 400 K/uL   nRBC 0.0 0.0 - 0.2 %    Comment: Performed at [facility], [place]  [address], [place], [place] [address]  Basic metabolic panel     Status: Abnormal   Collection Time: 10/24/18  2:42 PM  Result Value Ref Range   Sodium 139 135 - 145 mmol/L   Potassium 3.3 (L) 3.5 - 5.1 mmol/L   Chloride 101 98 - 111 mmol/L   CO2 24 22 - 32 mmol/L   Glucose, Bld 107 (H) 70 - 99 mg/dL   BUN 9 8 - 23 mg/dL   Creatinine, Ser 0.77 0.44 - 1.00 mg/dL   Calcium 10.4 (H) 8.9 - 10.3 mg/dL   GFR calc non Af Amer >60 >60 mL/min   GFR calc Af Amer >60 >60 mL/min   Anion gap 14 5 - 15    Comment: Performed at [facility], [place] [address], [place], [place] [address]  Type and screen [place]     Status: None   Collection Time: 10/24/18  3:25 PM  Result Value Ref Range   ABO/RH(D) A POS    Antibody Screen NEG    Sample Expiration 10/28/2018,2359    Extend sample reason  NO TRANSFUSIONS OR PREGNANCY IN THE PAST 3 MONTHS Performed at [place] [address] [place], [place] [address]   ABO/Rh     Status: None   Collection Time: 10/24/18  3:25 PM  Result Value Ref Range   ABO/RH(D)      A POS Performed at [facility], [place] [address], [place], [place] [address]     Imaging / Studies: No results found.  Medications / Allergies: per chart  Antibiotics: Anti-infectives (From admission, onward)   Start     Dose/Rate Route Frequency Ordered Stop   10/25/18 1600  metroNIDAZOLE (FLAGYL) IVPB 500 mg     500 mg 100 mL/hr over 60 Minutes Intravenous Every 8 hours 10/25/18 1329 10/25/18 1636   10/25/18 0600  cefTRIAXone (ROCEPHIN) 2 g in sodium chloride 0.9 % 100 mL IVPB     2 g 200 mL/hr over 30 Minutes Intravenous On call to O.R. 10/25/18 0981 10/25/18 0815   10/25/18 0600  metroNIDAZOLE (FLAGYL) IVPB 500 mg     500 mg 100 mL/hr over 60 Minutes Intravenous On call to O.R. 10/25/18 1914 10/25/18 7829        Note: Portions of this report may have been transcribed using voice  recognition software. Every effort was made to ensure accuracy; however, inadvertent computerized transcription errors may be present.   Any transcriptional errors that result from this process are unintentional.     Adin Hector, MD, FACS, MASCRS Gastrointestinal and Minimally Invasive Surgery    [address] [place], [place] [address] [phone] Main / Paging [phone] Fax

## 2018-10-26 NOTE — Evaluation (Signed)
Occupational Therapy Evaluation Patient Details Name: Shannon Ortiz MRN: [id number] DOB: [birth year] Today's Date: 10/26/2018    History of Present Illness 83 yo never smoker with h/o HTN, anxiety, fibromyalgia, hypothyroidism, thyroid cancer, benign esophageal stricture with epiphrenic distal esophageal diverticulum .  Pt admitted for ROBOTIC RESECTION OF ESOPHAGEAL DIVERTICULUM with Dr Shannon Ortiz   Clinical Impression   Pt admitted for surgery per Dr Shannon Ortiz. Pt currently with functional limitations due to the deficits listed below (see OT Problem List).  Pt will benefit from skilled OT to increase their safety and independence with ADL and functional mobility for ADL to facilitate discharge to venue listed below.   Pt has family as home that will A as needed with ADL activity.     Follow Up Recommendations  Supervision - Intermittent;No OT follow up    Equipment Recommendations  3 in 1 bedside commode    Recommendations for Other Services       Precautions / Restrictions Precautions Precautions: Fall Precaution Comments: JP drain       Mobility Bed Mobility               General bed mobility comments: pt in chair  Transfers Overall transfer level: Needs assistance Equipment used: Rolling [place] (2 wheeled) Transfers: Sit to/from [facility] Sit to Stand: Min guard Stand pivot transfers: Min guard       General transfer comment: VC for hand placement and pushing up from chair    Balance Overall balance assessment: Mild deficits observed, not formally tested                                         ADL either performed or assessed with clinical judgement   ADL Overall ADL's : Needs assistance/impaired Eating/Feeding: Set up;Sitting   Grooming: Set up;Sitting   Upper Body Bathing: Set up;Sitting   Lower Body Bathing: Moderate assistance;Sit to/from stand   Upper Body Dressing : Set up;Sitting   Lower Body Dressing: Moderate  assistance;Sit to/from stand;Cueing for sequencing;Cueing for safety Lower Body Dressing Details (indicate cue type and reason): may benefit from AE Toilet Transfer: Min guard;Comfort height toilet;Ambulation   Toileting- Clothing Manipulation and Hygiene: Minimal assistance;Sit to/from stand;Cueing for sequencing;Cueing for safety               Vision Patient Visual Report: No change from baseline              Pertinent Vitals/Pain Pain Assessment: 0-10 Pain Score: 2  Pain Location: sore abdomen Pain Descriptors / Indicators: Sore Pain Intervention(s): Limited activity within patient's tolerance;Repositioned;Monitored during session     Hand Dominance Right   Extremity/Trunk Assessment Upper Extremity Assessment Upper Extremity Assessment: Overall WFL for tasks assessed           Communication Communication Communication: No difficulties   Cognition Arousal/Alertness: Awake/alert Behavior During Therapy: WFL for tasks assessed/performed Overall Cognitive Status: Within Functional Limits for tasks assessed                                                Home Living Family/patient expects to be discharged to:: Private residence Living Arrangements: Children(daughter) Available Help at Discharge: Family;Available 24 hours/day(daughter, granddaughter) Type of Home: House Home Access: Stairs to enter [employer] of  Steps: 3 Entrance Stairs-Rails: Left;Right;Can reach both Home Layout: One level     Bathroom Shower/Tub: Teacher, early years/pre: Standard Bathroom Accessibility: Yes   Home Equipment: Cane - single point          Prior Functioning/Environment Level of Independence: Independent with assistive device(s)                 OT Problem List: Decreased strength;Decreased knowledge of use of DME or AE      OT Treatment/Interventions: Self-care/ADL training;Patient/family education;Therapeutic  activities    OT Goals(Current goals can be found in the care plan section) Acute Rehab OT Goals Patient Stated Goal: home tomorrow OT Goal Formulation: With patient Time For Goal Achievement: 11/09/18 Potential to Achieve Goals: Good ADL Goals Pt Will Perform Lower Body Dressing: with supervision;sit to/from stand Pt Will Transfer to Toilet: with supervision;ambulating Pt Will Perform Toileting - Clothing Manipulation and hygiene: with supervision;sit to/from stand Pt Will Perform Tub/Shower Transfer: Tub transfer;3 in 1  OT Frequency: Min 2X/week   Barriers to Ortiz/C:               AM-PAC OT "6 Clicks" Daily Activity     Outcome Measure Help from another person eating meals?: None Help from another person taking care of personal grooming?: None Help from another person toileting, which includes using toliet, bedpan, or urinal?: A Little Help from another person bathing (including washing, rinsing, drying)?: A Little Help from another person to put on and taking off regular upper body clothing?: None Help from another person to put on and taking off regular lower body clothing?: A Little 6 Click Score: 21   End of Session Equipment Utilized During Treatment: Rolling [place] Nurse Communication: Mobility status  Activity Tolerance: Patient tolerated treatment well Patient left: in chair;with call bell/phone within reach  OT Visit Diagnosis: Unsteadiness on feet (R26.81)                Time: 3794-3276 OT Time Calculation (min): 19 min Charges:  OT Evaluation $OT Eval Moderate Complexity: 1 Mod  Shannon Ortiz, OT Acute Rehabilitation Services [phone] Office- [phone]     [name], Shannon Ortiz 10/26/2018, 1:47 PM

## 2018-10-26 NOTE — Evaluation (Signed)
Physical Therapy Evaluation Patient Details Name: Shannon Ortiz MRN: [id number] DOB: Jul 08, 1935 Today's Date: 10/26/2018   History of Present Illness  83 yo never smoker with h/o HTN, anxiety, fibromyalgia, hypothyroidism, thyroid cancer, benign esophageal stricture with epiphrenic distal esophageal diverticulum .  Pt admitted for ROBOTIC RESECTION OF ESOPHAGEAL DIVERTICULUM with Dr Johney Maine  Clinical Impression   Pt presents with abdominal pain with bed mobility, decreased knowledge and application of log roll technique for abdominal protection and comfort, slow gait speed, and decreased tolerance for ambulation. Pt to benefit from acute PT to address deficits. Pt ambulated hallway distance with RW, limited by fatigue. Pt with no abdominal pain with walking, only with bed mobility. PT recommending HHPT to address deficits post-acutely. PT to progress mobility as tolerated, and will continue to follow acutely.      Follow Up Recommendations Home health PT;Supervision for mobility/OOB    Equipment Recommendations  None recommended by PT    Recommendations for Other Services       Precautions / Restrictions Precautions Precautions: Fall Precaution Comments: JP drain  Restrictions Weight Bearing Restrictions: No      Mobility  Bed Mobility Overal bed mobility: Needs Assistance Bed Mobility: Sit to Supine;Rolling Rolling: Supervision     Sit to supine: Min assist;HOB elevated   General bed mobility comments: Pt up in chair upon PT arrival. Pt required min assist for LE management and instructing pt in log roll technique for in/out of bed, for abdominal comfort post-surgery.  Transfers Overall transfer level: Needs assistance Equipment used: Rolling [place] (2 wheeled) Transfers: Sit to/from Stand Sit to Stand: Min guard Stand pivot transfers: Min guard       General transfer comment: Min guard for safety, pt with proper hand placement when rising.    Ambulation/Gait Ambulation/Gait assistance: Min guard Gait Distance (Feet): 150 Feet Assistive device: Rolling [place] (2 wheeled) Gait Pattern/deviations: Step-through pattern;Decreased stride length;Trunk flexed Gait velocity: decr    General Gait Details: Min guard for safety. VC for upright posture, placement in RW x2. Pt with slow and steady gait with AD use.   Stairs            Wheelchair Mobility    Modified Rankin (Stroke Patients Only)       Balance Overall balance assessment: Needs assistance Sitting-balance support: No upper extremity supported;Feet supported Sitting balance-Leahy Scale: Good     Standing balance support: No upper extremity supported Standing balance-Leahy Scale: Fair Standing balance comment: able to walk without UE support for short distance, reaches for environment for steadying                             Pertinent Vitals/Pain Pain Assessment: Faces Pain Score: 2  Faces Pain Scale: Hurts a little bit Pain Location: abdomen, during bed mobility Pain Descriptors / Indicators: Sore Pain Intervention(s): Monitored during session;Repositioned;Limited activity within patient's tolerance    Home Living Family/patient expects to be discharged to:: Private residence Living Arrangements: Children(daughter) Available Help at Discharge: Family;Available 24 hours/day(daughter, granddaughter) Type of Home: House Home Access: Stairs to enter Entrance Stairs-Rails: Left;Right;Can reach both Technical brewer of Steps: 3 Home Layout: One level Home Equipment: Cane - single point      Prior Function Level of Independence: Independent with assistive device(s)         Comments: Pt reports using cane for ambulation PTA     Hand Dominance   Dominant Hand: Right    Extremity/Trunk  Assessment   Upper Extremity Assessment Upper Extremity Assessment: Overall WFL for tasks assessed(Per OT note)    Lower Extremity  Assessment Lower Extremity Assessment: Overall WFL for tasks assessed    Cervical / Trunk Assessment Cervical / Trunk Assessment: Normal  Communication   Communication: No difficulties  Cognition Arousal/Alertness: Awake/alert Behavior During Therapy: WFL for tasks assessed/performed Overall Cognitive Status: Within Functional Limits for tasks assessed                                        General Comments      Exercises     Assessment/Plan    PT Assessment Patient needs continued PT services  PT Problem List Decreased strength;Decreased mobility;Decreased activity tolerance;Decreased balance;Decreased knowledge of use of DME;Pain       PT Treatment Interventions DME instruction;Functional mobility training;Balance training;Patient/family education;Gait training;Therapeutic activities;Stair training;Therapeutic exercise    PT Goals (Current goals can be found in the Care Plan section)  Acute Rehab PT Goals Patient Stated Goal: home tomorrow PT Goal Formulation: With patient Time For Goal Achievement: 11/09/18 Potential to Achieve Goals: Good    Frequency Min 3X/week   Barriers to discharge        Co-evaluation               AM-PAC PT "6 Clicks" Mobility  Outcome Measure Help needed turning from your back to your side while in a flat bed without using bedrails?: A Little Help needed moving from lying on your back to sitting on the side of a flat bed without using bedrails?: A Little Help needed moving to and from a bed to a chair (including a wheelchair)?: A Little Help needed standing up from a chair using your arms (e.g., wheelchair or bedside chair)?: A Little Help needed to walk in hospital room?: A Little Help needed climbing 3-5 steps with a railing? : A Little 6 Click Score: 18    End of Session Equipment Utilized During Treatment: Gait belt Activity Tolerance: Patient tolerated treatment well;Patient limited by fatigue Patient  left: in bed;with bed alarm set;with call bell/phone within reach;with SCD's reapplied Nurse Communication: Mobility status PT Visit Diagnosis: Other abnormalities of gait and mobility (R26.89);Muscle weakness (generalized) (M62.81)    Time: 2409-7353 PT Time Calculation (min) (ACUTE ONLY): 24 min   Charges:   PT Evaluation $PT Eval Low Complexity: 1 Low PT Treatments $Gait Training: 8-22 mins        Julien Girt, PT Acute Rehabilitation Services Pager [phone]  Office [facility] 10/26/2018, 2:18 PM

## 2018-10-27 ENCOUNTER — Encounter (HOSPITAL_COMMUNITY): Payer: Self-pay | Admitting: *Deleted

## 2018-10-27 LAB — BASIC METABOLIC PANEL
Anion gap: 10 (ref 5–15)
BUN: 13 mg/dL (ref 8–23)
CO2: 27 mmol/L (ref 22–32)
Calcium: 9.4 mg/dL (ref 8.9–10.3)
Chloride: 101 mmol/L (ref 98–111)
Creatinine, Ser: 0.87 mg/dL (ref 0.44–1.00)
GFR calc Af Amer: >60 mL/min (ref >60)
GFR calc non Af Amer: >60 mL/min (ref >60)
Glucose, Bld: 125 mg/dL — ABNORMAL HIGH (ref 70–99)
Potassium: 2.7 mmol/L — CL (ref 3.5–5.1)
Sodium: 138 mmol/L (ref 135–145)

## 2018-10-27 LAB — MAGNESIUM: Magnesium: 1.6 mg/dL — ABNORMAL LOW (ref 1.7–2.4)

## 2018-10-27 MED ORDER — GABAPENTIN 250 MG/5ML PO SOLN
300.0000 mg | Freq: Two times a day (BID) | ORAL | Status: DC
  Administered 2018-10-27 (×2): 300 mg via ORAL
  Filled 2018-10-27 (×3): qty 6

## 2018-10-27 MED ORDER — MAGNESIUM SULFATE 2 GM/50ML IV SOLN
2.0000 g | Freq: Once | INTRAVENOUS | Status: AC
  Administered 2018-10-27: 2 g via INTRAVENOUS
  Filled 2018-10-27: qty 50

## 2018-10-27 MED ORDER — POTASSIUM CHLORIDE 20 MEQ/15ML (10%) PO SOLN
40.0000 meq | Freq: Every day | ORAL | Status: DC
  Administered 2018-10-27 – 2018-10-28 (×2): 40 meq via ORAL
  Filled 2018-10-27 (×2): qty 30

## 2018-10-27 MED ORDER — PANTOPRAZOLE SODIUM 40 MG PO PACK
40.0000 mg | PACK | Freq: Every day | ORAL | Status: DC
  Administered 2018-10-28: 11:00:00 40 mg via ORAL
  Filled 2018-10-27: qty 20

## 2018-10-27 MED ORDER — POTASSIUM CHLORIDE CRYS ER 20 MEQ PO TBCR: 40.0000 meq | EXTENDED_RELEASE_TABLET | Freq: Every day | ORAL | Status: DC

## 2018-10-27 MED ORDER — FUROSEMIDE 40 MG PO TABS
40.0000 mg | ORAL_TABLET | Freq: Once | ORAL | Status: AC
  Administered 2018-10-27: 40 mg via ORAL
  Filled 2018-10-27: qty 1

## 2018-10-27 MED ORDER — POTASSIUM CHLORIDE 10 MEQ/100ML IV SOLN
10.0000 meq | INTRAVENOUS | Status: AC
  Administered 2018-10-27 (×4): 10 meq via INTRAVENOUS
  Filled 2018-10-27 (×3): qty 100

## 2018-10-27 MED ORDER — PANTOPRAZOLE SODIUM 40 MG PO PACK
40.0000 mg | PACK | Freq: Every day | ORAL | Status: DC
  Administered 2018-10-27: 40 mg
  Filled 2018-10-27: qty 20

## 2018-10-27 NOTE — Progress Notes (Signed)
Occupational Therapy Treatment Patient Details Name: Shannon Ortiz MRN: [id number] DOB: 08-24-1935 Today's Date: 10/27/2018    History of present illness 83 yo never smoker with h/o HTN, anxiety, fibromyalgia, hypothyroidism, thyroid cancer, benign esophageal stricture with epiphrenic distal esophageal diverticulum .  Pt admitted for ROBOTIC RESECTION OF ESOPHAGEAL DIVERTICULUM with Dr Johney Maine   OT comments  Educated on AE and pt used reacher and sock aide. Had just finished with PT. Did not need to use bathroom.  Pt has a tub at home, no rails. Will need to further assess safety of this. She showers when daughter is home and not every day   Follow Up Recommendations  (supervision for mobility; assist for adls)   Pt states she would be interested in rehab for short period of time:  Daughter works   Equipment Recommendations  3 in 1 bedside commode    Recommendations for Other Services      Precautions / Restrictions Precautions Precautions: Fall Precaution Comments: JP drain  Restrictions Weight Bearing Restrictions: No       Mobility Bed Mobility               General bed mobility comments: pt in chair on PT arrival  Transfers Overall transfer level: Needs assistance Equipment used: Rolling Fan (2 wheeled) Transfers: Sit to/from Stand Sit to Stand: Min guard;Supervision         General transfer comment: verbal cues for proper hand placement, control descent    Balance   Sitting-balance support: No upper extremity supported;Feet supported Sitting balance-Leahy Scale: Good     Standing balance support: No upper extremity supported Standing balance-Leahy Scale: Fair                             ADL either performed or assessed with clinical judgement   ADL               Lower Body Bathing: Min guard;With adaptive equipment;Sit to/from stand       Lower Body Dressing: Minimal assistance;Sit to/from stand;With adaptive equipment                 General ADL Comments: educated on reacher and sock aide. Pt used both for ADLs     Vision       Perception     Praxis      Cognition Arousal/Alertness: Awake/alert Behavior During Therapy: WFL for tasks assessed/performed Overall Cognitive Status: Within Functional Limits for tasks assessed                                          Exercises     Shoulder Instructions       General Comments      Pertinent Vitals/ Pain       Pain Assessment: No/denies pain  Home Living                                          Prior Functioning/Environment              Frequency  Min 2X/week        Progress Toward Goals  OT Goals(current goals can now be found in the care plan section)  Progress towards OT goals: Progressing toward goals  Acute Rehab OT Goals Patient Stated Goal: home tomorrow  Plan      Co-evaluation                 AM-PAC OT "6 Clicks" Daily Activity     Outcome Measure   Help from another person eating meals?: None Help from another person taking care of personal grooming?: A Little Help from another person toileting, which includes using toliet, bedpan, or urinal?: A Little Help from another person bathing (including washing, rinsing, drying)?: A Little Help from another person to put on and taking off regular upper body clothing?: A Little Help from another person to put on and taking off regular lower body clothing?: A Little 6 Click Score: 19    End of Session    OT Visit Diagnosis: Unsteadiness on feet (R26.81)   Activity Tolerance Patient tolerated treatment well   Patient Left in chair;with call bell/phone within reach   Nurse Communication          Time: 7741-4239 OT Time Calculation (min): 20 min  Charges: OT General Charges $OT Visit: 1 Visit OT Treatments $Self Care/Home Management : 8-22 mins  Lesle Chris, OTR/L Acute Rehabilitation Services [phone]  [facility] pager [phone] office 10/27/2018   [name] 10/27/2018, 2:42 PM

## 2018-10-27 NOTE — Progress Notes (Signed)
CRITICAL VALUE ALERT  Critical Value:  Potassium 2.7  Date & Time Notied:  10/27/18 0940  Provider Notified: yes  Orders Received/Actions taken:

## 2018-10-27 NOTE — Progress Notes (Signed)
Shannon Ortiz [id number] [birth year]  CARE TEAM:  PCP: Lawerance Cruel, MD  Outpatient Care Team: Patient Care Team: Lawerance Cruel, MD as PCP - General (Family Medicine) Michael Boston, MD as Consulting Physician (General Surgery) Ronnette Juniper, MD as Consulting Physician (Gastroenterology)  Inpatient Treatment Team: Treatment Team: Attending Provider: Michael Boston, MD; Registered Nurse: Einar Crow, RN; Occupational Therapist: Lesle Chris, OT   Problem List:   Principal Problem:   Acquired diverticulum of distal esophagus s/p robotic excision & myotomy 10/25/2018 Active Problems:   HTN (hypertension)   Esophageal stricture   Hiatal hernia s/p robotic repair 10/25/2018   Status post robotic Toupet (posterior partial) fundoplication 1/65/5374   2 Days Post-Op  10/25/2018  POST-OPERATIVE DIAGNOSIS:   BENIGN ESOPHAGEAL STRICTURE EPIPHRENIC DISTAL ESOPHAGEAL DIVERTICULUM HIATAL HERNIA  PROCEDURE:   ESOPHAGEAL EPIPHRENIC DIVERTICULECTOMY HELLER CARDIOMYOTOMY TOUPET FUNDOPLICATION (PARTIAL POSTERIOR 240 DEGREE WRAP) ROBOTIC HIATAL HERNIA REPAIR  ESOPHAGOGASTRODUODENOSCOPY (EGD)  SURGEON:  Adin Hector, MD    Assessment  Recovering  [facility] Stay = 2 days)  Plan:  -medlock IVF -Lasix diuresis -clears low vol OK.  Esophagram this AM.  If no leak/obstruction, ADAT Dys1 -keep drain for now - prob d/c at d/c home -HTN control - PRN backup -PT/OT evals to make sure OK to transition back home (uses occasional cane).  Set up [facility] visit for PT per their recs -VTE prophylaxis- SCDs, etc -mobilize as tolerated to help recovery  D/C patient from hospital when patient meets criteria (anticipate in 1-2 day(s)):  Tolerating oral intake well Ambulating well Adequate pain control without IV medications Urinating  Having flatus Disposition planning in place   20 minutes spent in review, evaluation, examination, counseling, and coordination of care.   More than 50% of that time was spent in counseling.  10/27/2018    Subjective: (Chief complaint)  Esophagram w some expected narrowing.  Most likely wrap edema.  Diuresis and storage ordered.  Tolerated some dysphagia 1 diet but then had dysphagia to pills.  Walking room.  Nursing and aide in room.  Objective:  Vital signs:  Vitals:   10/26/18 1325 10/26/18 1500 10/26/18 2028 10/27/18 0511  BP: 121/63  (!) 144/79 129/76  Pulse: 83  78 69  Resp: 17  17 15   Temp: 98.2 F (36.8 C)  99.1 F (37.3 C) 97.8 F (36.6 C)  TempSrc: Oral  Oral Oral  SpO2: 99% 99% 93% 95%  Weight:      Height:        Last BM Date: 10/11/18  Intake/Output   Yesterday:  05/13 0701 - 05/14 0700 In: 726 [P.O.:720; I.V.:6] Out: 30 [Drains:30] This shift:  No intake/output data recorded.  Bowel function:  Flatus: YES  BM:  No  Drain: Serosanguinous   Physical Exam:  General: Pt awake/alert/oriented x4 in no acute distress Eyes: PERRL, normal EOM.  Sclera clear.  No icterus Neuro: CN II-XII intact w/o focal sensory/motor deficits. Lymph: No head/neck/groin lymphadenopathy Psych:  No delerium/psychosis/paranoia HENT: Normocephalic, Mucus membranes moist.  No thrush Neck: Supple, No tracheal deviation Chest: No chest wall pain w good excursion CV:  Pulses intact.  Regular rhythm MS: Normal AROM mjr joints.  No obvious deformity  Abdomen: Soft.  Nondistended.  Nontender.  No evidence of peritonitis.  No incarcerated hernias.  Ext:  No deformity.  No mjr edema.  No cyanosis Skin: No petechiae / purpura  Results:   Cultures: Recent Results (from the past 720 hour(s))  SARS Coronavirus 2 (  CEPHEID - Performed in [place] hospital lab), Hosp Order     Status: None   Collection Time: 10/15/18  1:39 PM  Result Value Ref Range Status   SARS Coronavirus 2 NEGATIVE NEGATIVE Final    Comment: (NOTE) If result is NEGATIVE SARS-CoV-2 target nucleic acids are NOT DETECTED. The  SARS-CoV-2 RNA is generally detectable in upper and lower  respiratory specimens during the acute phase of infection. The lowest  concentration of SARS-CoV-2 viral copies this assay can detect is 250  copies / mL. A negative result does not preclude SARS-CoV-2 infection  and should not be used as the sole basis for treatment or other  patient management decisions.  A negative result may occur with  improper specimen collection / handling, submission of specimen other  than nasopharyngeal swab, presence of viral mutation(s) within the  areas targeted by this assay, and inadequate number of viral copies  (<250 copies / mL). A negative result must be combined with clinical  observations, patient history, and epidemiological information. If result is POSITIVE SARS-CoV-2 target nucleic acids are DETECTED. The SARS-CoV-2 RNA is generally detectable in upper and lower  respiratory specimens dur ing the acute phase of infection.  Positive  results are indicative of active infection with SARS-CoV-2.  Clinical  correlation with patient history and other diagnostic information is  necessary to determine patient infection status.  Positive results do  not rule out bacterial infection or co-infection with other viruses. If result is PRESUMPTIVE POSTIVE SARS-CoV-2 nucleic acids MAY BE PRESENT.   A presumptive positive result was obtained on the submitted specimen  and confirmed on repeat testing.  While 2019 novel coronavirus  (SARS-CoV-2) nucleic acids may be present in the submitted sample  additional confirmatory testing may be necessary for epidemiological  and / or clinical management purposes  to differentiate between  SARS-CoV-2 and other Sarbecovirus currently known to infect humans.  If clinically indicated additional testing with an alternate test  methodology [phone]) is advised. The SARS-CoV-2 RNA is generally  detectable in upper and lower respiratory sp ecimens during the acute    phase of infection. The expected result is Negative. Fact Sheet for Patients:  StrictlyIdeas.no Fact Sheet for Healthcare Providers: BankingDealers.co.za This test is not yet approved or cleared by the [place] FDA and has been authorized for detection and/or diagnosis of SARS-CoV-2 by FDA under an Emergency Use Authorization (EUA).  This EUA will remain in effect (meaning this test can be used) for the duration of the COVID-19 declaration under Section 564(b)(1) of the Act, 21 U.S.C. section 360bbb-3(b)(1), unless the authorization is terminated or revoked sooner. Performed at [facility], [place] [address] [place], [place] [address]   SARS Coronavirus 2 (CEPHEID - Performed in [facility] hospital lab), Hosp Order     Status: None   Collection Time: 10/24/18  8:42 AM  Result Value Ref Range Status   SARS Coronavirus 2 NEGATIVE NEGATIVE Final    Comment: (NOTE) If result is NEGATIVE SARS-CoV-2 target nucleic acids are NOT DETECTED. The SARS-CoV-2 RNA is generally detectable in upper and lower  respiratory specimens during the acute phase of infection. The lowest  concentration of SARS-CoV-2 viral copies this assay can detect is 250  copies / mL. A negative result does not preclude SARS-CoV-2 infection  and should not be used as the sole basis for treatment or other  patient management decisions.  A negative result may occur with  improper specimen collection / handling, submission  of specimen other  than nasopharyngeal swab, presence of viral mutation(s) within the  areas targeted by this assay, and inadequate number of viral copies  (<250 copies / mL). A negative result must be combined with clinical  observations, patient history, and epidemiological information. If result is POSITIVE SARS-CoV-2 target nucleic acids are DETECTED. The SARS-CoV-2 RNA is generally detectable in upper and lower  respiratory specimens  dur ing the acute phase of infection.  Positive  results are indicative of active infection with SARS-CoV-2.  Clinical  correlation with patient history and other diagnostic information is  necessary to determine patient infection status.  Positive results do  not rule out bacterial infection or co-infection with other viruses. If result is PRESUMPTIVE POSTIVE SARS-CoV-2 nucleic acids MAY BE PRESENT.   A presumptive positive result was obtained on the submitted specimen  and confirmed on repeat testing.  While 2019 novel coronavirus  (SARS-CoV-2) nucleic acids may be present in the submitted sample  additional confirmatory testing may be necessary for epidemiological  and / or clinical management purposes  to differentiate between  SARS-CoV-2 and other Sarbecovirus currently known to infect humans.  If clinically indicated additional testing with an alternate test  methodology [phone]) is advised. The SARS-CoV-2 RNA is generally  detectable in upper and lower respiratory sp ecimens during the acute  phase of infection. The expected result is Negative. Fact Sheet for Patients:  StrictlyIdeas.no Fact Sheet for Healthcare Providers: BankingDealers.co.za This test is not yet approved or cleared by the [place] FDA and has been authorized for detection and/or diagnosis of SARS-CoV-2 by FDA under an Emergency Use Authorization (EUA).  This EUA will remain in effect (meaning this test can be used) for the duration of the COVID-19 declaration under Section 564(b)(1) of the Act, 21 U.S.C. section 360bbb-3(b)(1), unless the authorization is terminated or revoked sooner. Performed at [facility], [place] [address] [place], [place] [address]     Labs: No results found for this or any previous visit (from the past 48 hour(s)).  Imaging / Studies: Dg Esophagus W Single Cm (sol Or Thin Ba)  Result Date:  10/26/2018 CLINICAL DATA:  Postop day 1 from from laparoscopic fundoplication. EXAM: ESOPHOGRAM/BARIUM SWALLOW TECHNIQUE: Single contrast examination was performed using water-soluble Gastrografin. FLUOROSCOPY TIME:  Fluoroscopy Time:  1 minutes 6 seconds Radiation Exposure Index (if provided by the fluoroscopic device): 17.1 mGy Number of Acquired Spot Images: 0 COMPARISON:  None. FINDINGS: Moderate smooth narrowing is seen at the GE junction, which results in stasis of contrast within the esophagus. This may be due to postop edema. No evidence of contrast leak or extravasation. No evidence of persistent hiatal hernia. IMPRESSION: Moderate smooth narrowing at the GE junction, which may be due to postop edema. No evidence of contrast leak or extravasation. Electronically Signed   By: Earle Gell M.D.   On: 10/26/2018 12:50    Medications / Allergies: per chart  Antibiotics: Anti-infectives (From admission, onward)   Start     Dose/Rate Route Frequency Ordered Stop   10/25/18 1600  metroNIDAZOLE (FLAGYL) IVPB 500 mg     500 mg 100 mL/hr over 60 Minutes Intravenous Every 8 hours 10/25/18 1329 10/25/18 1636   10/25/18 0600  cefTRIAXone (ROCEPHIN) 2 g in sodium chloride 0.9 % 100 mL IVPB     2 g 200 mL/hr over 30 Minutes Intravenous On call to O.R. 10/25/18 8916 10/25/18 0815   10/25/18 0600  metroNIDAZOLE (FLAGYL) IVPB 500 mg  500 mg 100 mL/hr over 60 Minutes Intravenous On call to O.R. 10/25/18 8628 10/25/18 2417        Note: Portions of this report may have been transcribed using voice recognition software. Every effort was made to ensure accuracy; however, inadvertent computerized transcription errors may be present.   Any transcriptional errors that result from this process are unintentional.     Adin Hector, MD, FACS, MASCRS Gastrointestinal and Minimally Invasive Surgery    [address] [place], [place] [address] [phone] Main / Paging [phone] Fax

## 2018-10-27 NOTE — Progress Notes (Signed)
Physical Therapy Treatment Patient Details Name: Shannon Ortiz MRN: [id number] DOB: 1936-02-09 Today's Date: 10/27/2018    History of Present Illness 83 yo never smoker with h/o HTN, anxiety, fibromyalgia, hypothyroidism, thyroid cancer, benign esophageal stricture with epiphrenic distal esophageal diverticulum .  Pt admitted for ROBOTIC RESECTION OF ESOPHAGEAL DIVERTICULUM with Dr Johney Maine    PT Comments    Pt in chair on arrival, very pleasant and motivated to mobilize; incr gait distance today with minimal fatigue.  Will continue to follow in acute setting  Follow Up Recommendations  Home health PT;Supervision for mobility/OOB     Equipment Recommendations  None recommended by PT    Recommendations for Other Services       Precautions / Restrictions Precautions Precautions: Fall Precaution Comments: JP drain  Restrictions Weight Bearing Restrictions: No    Mobility  Bed Mobility               General bed mobility comments: pt in chair on PT arrival  Transfers Overall transfer level: Needs assistance Equipment used: Rolling Stuckey (2 wheeled) Transfers: Sit to/from Stand Sit to Stand: Min guard;Supervision         General transfer comment: verbal cues for proper hand placement, control descent  Ambulation/Gait Ambulation/Gait assistance: Min guard;Supervision Gait Distance (Feet): 200 Feet Assistive device: Rolling Cinnamon (2 wheeled) Gait Pattern/deviations: Step-through pattern;Decreased stride length     General Gait Details: min/guard to supervision for safety, VCs for RW position from self    Stairs             Wheelchair Mobility    Modified Rankin (Stroke Patients Only)       Balance   Sitting-balance support: No upper extremity supported;Feet supported Sitting balance-Leahy Scale: Good     Standing balance support: No upper extremity supported Standing balance-Leahy Scale: Fair                               Cognition Arousal/Alertness: Awake/alert Behavior During Therapy: WFL for tasks assessed/performed Overall Cognitive Status: Within Functional Limits for tasks assessed                                        Exercises      General Comments        Pertinent Vitals/Pain Pain Assessment: No/denies pain    Home Living                      Prior Function            PT Goals (current goals can now be found in the care plan section) Acute Rehab PT Goals Patient Stated Goal: home tomorrow PT Goal Formulation: With patient Time For Goal Achievement: 11/09/18 Potential to Achieve Goals: Good Progress towards PT goals: Progressing toward goals    Frequency    Min 3X/week      PT Plan Current plan remains appropriate    Co-evaluation              AM-PAC PT "6 Clicks" Mobility   Outcome Measure  Help needed turning from your back to your side while in a flat bed without using bedrails?: A Little Help needed moving from lying on your back to sitting on the side of a flat bed without using bedrails?: A Little Help needed moving to and from  a bed to a chair (including a wheelchair)?: A Little Help needed standing up from a chair using your arms (e.g., wheelchair or bedside chair)?: A Little Help needed to walk in hospital room?: A Little Help needed climbing 3-5 steps with a railing? : A Little 6 Click Score: 18    End of Session   Activity Tolerance: Patient tolerated treatment well Patient left: in chair;with call bell/phone within reach Nurse Communication: Mobility status PT Visit Diagnosis: Other abnormalities of gait and mobility (R26.89);Muscle weakness (generalized) (M62.81)     Time: 2993-7169 PT Time Calculation (min) (ACUTE ONLY): 13 min  Charges:  $Gait Training: 8-22 mins                    Kenyon Ana, PT  Pager: [phone] Acute Rehab Dept [facility]): [phone]   10/27/2018    [facility] 10/27/2018, 2:19  PM

## 2018-10-27 NOTE — Plan of Care (Signed)
  Problem: Education: Goal: Knowledge of General Education information will improve Description Including pain rating scale, medication(s)/side effects and non-pharmacologic comfort measures Outcome: Progressing   Problem: Clinical Measurements: Goal: Ability to maintain clinical measurements within normal limits will improve Outcome: Progressing Goal: Respiratory complications will improve Outcome: Progressing   Problem: Activity: Goal: Risk for activity intolerance will decrease Outcome: Progressing   Problem: Elimination: Goal: Will not experience complications related to urinary retention Outcome: Progressing   Problem: Pain Managment: Goal: General experience of comfort will improve Outcome: Progressing

## 2018-10-27 NOTE — Plan of Care (Signed)
Nutrition Education Note  RD consulted for nutrition education regarding following a pureed diet for 2-3 weeks post-op in a pt s/p gastric fundoplication.  RD working remotely.  RD called pt via room phone and provided education via phone call. Pt reports that she is feeling much better and states that she has been tolerating pureed food well so far today. Pt states that she had grits and decaf coffee this morning.   RD attached  "National Dysphagia Diet Pureed Nutrition Therapy" handout from the Academy of Nutrition and Dietetics to pt's discharge instructions.  Discussed ways for pt to meet nutrition goals over the next several weeks while following a pureed diet. Encouraged pt to consume oral nutrition supplements throughout the day to aid her in meeting kcal and protein needs. Pt reports that she has some Ensure supplements at home that she can drink. Explained reasons for pt to follow a pureed diet over the next 2-3 weeks.   Discussed best practice for pureeing foods and how to identify proper pureed textures. Pt reports that she has a blender at home that she can use and also has someone who can help her with the food preparation if needed.  Teach back method used. Pt verbalizes understanding of information provided.   Expect good compliance.  Body mass index is Body mass index is 26.16 kg/m. Pt meets criteria for overweight based on current BMI.  Current diet order is Dysphagia 1. Labs and medications reviewed. No further nutrition interventions warranted at this time. RD contact information provided. If additional nutrition issues arise, please re-consult RD.   Gaynell Face, MS, RD, LDN Inpatient Clinical Dietitian Pager: [phone] Weekend/After Hours: [phone]

## 2018-10-27 NOTE — Progress Notes (Signed)
Discussed after care needs with pt at bedside- explained recommendation for HHPT following DC. Pt in agreement- also requests RN for assistance with medications at home. Referred to [facility] for [facility]- spoke with representative who will follow and needs to be updated on date of DC. Pt reports she lives at home with her daughter who works daytime 6days/week and is with her at night. Has other family members nearby who also work but are supportive of her if not able to be with her 24/7.  Requests shower chair as she is having difficulty standing for long periods of time and concerned for her safety in shower. Will order.  Sharren Bridge, MSW, LCSW Clinical Social Work 10/27/2018 [phone]

## 2018-10-28 LAB — POTASSIUM: Potassium: 4 mmol/L (ref 3.5–5.1)

## 2018-10-28 NOTE — TOC Transition Note (Addendum)
Transition of Care [facility]) - CM/SW Discharge Note   Patient Details  Name: Zarah Carbon MRN: [id number] Date of Birth: Dec 25, 1935  [facility] CM/SW Contact:  Lia Hopping, [place] Phone Number: 10/28/2018, 9:58 AM   Clinical Narrative:    Physical [place] arranged. Shower chair to be delivered to patient room. Daughter to transport. No other concerns identified.     Final next level of care: [place] Barriers to Discharge: No Barriers Identified   Patient Goals and CMS Choice Patient states their goals for this hospitalization and ongoing recovery are:: Home  CMS Medicare.gov Compare Post Acute Care list provided to:: Patient    Discharge Placement  Home               Patient to be transferred to facility by: Daughter will transpot.  Name of family member notified: Patient to notify her daughter.  Patient and family notified of of transfer: 10/28/18  Discharge Plan and Services  Home              DME Arranged: Shower stool DME Agency: [employer] Date DME Agency Contacted: 10/28/18 Time DME Agency Contacted: [phone] Representative spoke with at DME Agency: Jackquline [place]  HH Arranged: PT [place]: [place] Date [place]: 10/27/18 Time [place]: 0930 Representative spoke with at [place]: [place] ([place] Interventions  Home   Readmission Risk Interventions No flowsheet data found.

## 2018-10-28 NOTE — Discharge Summary (Signed)
Physician Discharge Summary    Patient ID: Shannon Ortiz MRN: [id number] DOB/AGE: 1935-06-17  83 y.o.  Patient Care Team: Lawerance Cruel, MD as PCP - General (Family Medicine) Michael Boston, MD as Consulting Physician (General Surgery) Ronnette Juniper, MD as Consulting Physician (Gastroenterology)  Admit date: 10/25/2018  Discharge date: 10/28/2018  Hospital Stay = 3 days    Discharge Diagnoses:  Principal Problem:   Acquired diverticulum of distal esophagus s/p robotic excision & myotomy 10/25/2018 Active Problems:   HTN (hypertension)   Hypokalemia   Esophageal stricture   Hiatal hernia s/p robotic repair 10/25/2018   Status post robotic Toupet (posterior partial) fundoplication 2/70/3500   Hypomagnesemia   3 Days Post-Op  10/25/2018  POST-OPERATIVE DIAGNOSIS:   BENIGN ESOPHAGEAL STRICTURE EPIPHRENIC DISTAL ESOPHAGEAL DIVERTICULUM HIATAL HERNIA  PROCEDURE:   ESOPHAGEAL EPIPHRENIC DIVERTICULECTOMY HELLER CARDIOMYOTOMY TOUPET FUNDOPLICATION (PARTIAL POSTERIOR 240 DEGREE WRAP) ROBOTIC HIATAL HERNIA REPAIR  ESOPHAGOGASTRODUODENOSCOPY (EGD)  SURGEON:  Adin Hector, MD    Consults: PT, OT, Nutrition, SW  Hospital Course:   Pleasant woman with distal esophageal narrowing and stricture who developed a epiphrenic diverticulum.  Refractory to dilatation and Botox.  Surgical consultation made.  She want surgery to excise the diverticulum with contralateral myotomy and partial fundoplication to release the stricture and rebuild the heartburn valve.   Swallow study showed no evidence of a leak and only mild expected smooth narrowing at the fundoplication consistent with postoperative edema.  She underwent diuresis and steroids.  Hypokalemia and hypomagnesemia were corrected. Postoperatively, the patient gradually mobilized and advanced to a pured dysphagia 1 diet.  Pain and other symptoms were treated aggressively.    By the time of discharge, the patient was walking  well the hallways, eating food, having flatus.  Pain was well-controlled on an oral medications.  Based on meeting discharge criteria and continuing to recover, I felt it was safe for the patient to be discharged from the hospital to further recover with close followup.  Home health was set up for physical therapy and equipment.  Postoperative recommendations were discussed in detail.  They are written as well.  Discharged Condition: good  Discharge Exam: Blood pressure 113/74, pulse (!) 123, temperature 97.7 F (36.5 C), temperature source Oral, resp. rate 16, height 5\' 7"  (1.702 m), weight 77.2 kg, SpO2 96 %.  General: Pt awake/alert/oriented x4 in No acute distress Eyes: PERRL, normal EOM.  Sclera clear.  No icterus Neuro: CN II-XII intact w/o focal sensory/motor deficits. Lymph: No head/neck/groin lymphadenopathy Psych:  No delerium/psychosis/paranoia HENT: Normocephalic, Mucus membranes moist.  No thrush.   Neck: Supple, No tracheal deviation Chest: No chest wall pain w good excursion CV:  Pulses intact.  Regular rhythm MS: Normal AROM mjr joints.  No obvious deformity Abdomen: Soft.  Nondistended.  Nontender.  No evidence of peritonitis.  No incarcerated hernias. Ext:  SCDs BLE.  No mjr edema.  No cyanosis Skin: No petechiae / purpura  PATHOLOGY Diagnosis Esophagus, diverticulum, distal - DENSELY INFLAMED SQUAMOUS MUCOSA, CONSISTENT WITH CLINICALLY STATED DIVERTICULUM. - THERE IS NO EVIDENCE OF MALIGNANCY. Enid Cutter MD Pathologist, Electronic Signature (Case signed 10/26/2018) Specimen Shannon Ortiz and Clinical Information Specimen(s) Obtained: Esophagus, diverticulum, distal Specimen Clinical Information Benign esophageal stricture with epiphrenic distal esophageal diverticulum (nt) Anitra Doxtater Received in formalin is a 2.5 x 2.1 x 1.2 cm portion of luminal tan pink soft tissue, with one end received partially stapled and the opposite end terminating in a pouch. The specimen is  opened to reveal tan pink  to focally hyperemic glistening mucosa. Discrete perforations are not seen. Discrete masses or lesions are not seen. Representative sections are submitted in one cassette. (KL:kh 10/25/2018) Report signed out from the following location(s) Technical component and interpretation was performed at [facility], [place], [place] [address]. CLIA #: [id number], 1 of  Disposition:   Follow-up Information    Michael Boston, MD. Schedule an appointment as soon as possible for a visit in 2 week(s).   Specialty:  General Surgery Contact information: [address] [place] [address] [phone]           Discharge disposition: 01-Home or Self Care       Discharge Instructions    Call MD for:   Complete by:  As directed    Temperature > 101.24F   Call MD for:   Complete by:  As directed    Temperature > 101.24F   Call MD for:  extreme fatigue   Complete by:  As directed    Call MD for:  extreme fatigue   Complete by:  As directed    Call MD for:  hives   Complete by:  As directed    Call MD for:  hives   Complete by:  As directed    Call MD for:  persistant nausea and vomiting   Complete by:  As directed    Call MD for:  persistant nausea and vomiting   Complete by:  As directed    Call MD for:  redness, tenderness, or signs of infection (pain, swelling, redness, odor or green/yellow discharge around incision site)   Complete by:  As directed    Call MD for:  redness, tenderness, or signs of infection (pain, swelling, redness, odor or green/yellow discharge around incision site)   Complete by:  As directed    Call MD for:  severe uncontrolled pain   Complete by:  As directed    Call MD for:  severe uncontrolled pain   Complete by:  As directed    Diet general   Complete by:  As directed    SEE ESOPHAGEAL SURGERY DIET INSTRUCTIONS  We using usually start you out on a pureed (blenderized) diet.  Expect some sticking with swallowing over the next 1-2 months.   This is due to swelling around your esophagus at the wrap & hiatal diaphragm repair.  It will gradually ease off over the next few months.   Diet general   Complete by:  As directed    SEE ESOPHAGEAL SURGERY DIET INSTRUCTIONS  Continue pureed (blenderized) diet. = LEVEL 1 DIET on the esophageal surgery instruction sheet Expect some sticking with swallowing over the next 1-2 months.   This is due to swelling around your esophagus at the wrap & hiatal diaphragm repair.  It will gradually ease off over the next few months.   Discharge instructions   Complete by:  As directed    Please see discharge instruction sheets.   Also refer to any handouts/printouts that may have been given from the [facility] surgery office (if you visited [place] there before surgery) Please call our office if you have any questions or concerns [phone]   Discharge instructions   Complete by:  As directed    Please see discharge instruction sheets.   Also refer to any handouts/printouts that may have been given from the [facility] surgery office (if you visited [place] there before surgery) Please call our office if you have  any questions or concerns [phone]   Driving Restrictions   Complete by:  As directed    No driving until off narcotics and can safely swerve away without pain during an emergency   Driving Restrictions   Complete by:  As directed    No driving until off narcotics and can safely swerve away without pain during an emergency   Increase activity slowly   Complete by:  As directed    Increase activity slowly   Complete by:  As directed    Lifting restrictions   Complete by:  As directed    Avoid heavy lifting initially, <20 pounds at first.   Do not push through pain.   You have no specific weight limit: If it hurts to do, DON'T DO IT.    If you feel no pain, you are not injuring anything.  Pain will protect you from injury.   Coughing and  sneezing are far more stressful to your incision than any lifting.   Avoid resuming heavy lifting (>50 pounds) or other intense activity until off all narcotic pain medications.   When want to exercise more, give yourself 2 weeks to gradually get back to full intense exercise/activity.   Lifting restrictions   Complete by:  As directed    Avoid heavy lifting initially, <20 pounds at first.   Do not push through pain.   You have no specific weight limit: If it hurts to do, DON'T DO IT.    If you feel no pain, you are not injuring anything.  Pain will protect you from injury.   Coughing and sneezing are far more stressful to your incision than any lifting.   Avoid resuming heavy lifting (>50 pounds) or other intense activity until off all narcotic pain medications.   When want to exercise more, give yourself 2 weeks to gradually get back to full intense exercise/activity.   May shower / Bathe   Complete by:  As directed    [place].  It is fine for dressings or wounds to be washed/rinsed.  Use gentle soap & water.  This will help the incisions and/or wounds get clean & minimize infection.   May shower / Bathe   Complete by:  As directed    [place].  It is fine for dressings or wounds to be washed/rinsed.  Use gentle soap & water.  This will help the incisions and/or wounds get clean & minimize infection.   May walk up steps   Complete by:  As directed    May walk up steps   Complete by:  As directed    Remove dressing in 72 hours   Complete by:  As directed    You have closed incisions: Shower and bathe over these incisions with soap and water every day.  It is OK to wash over the dressings: they are waterproof. Remove all surgical dressings on postoperative day #3.  You do not need to replace dressings over the closed incisions unless you feel more comfortable with a Band-Aid covering it.   Please call our office [phone] if you have further questions.   Remove  dressing in 72 hours   Complete by:  As directed    You have closed incisions: Shower and bathe over these incisions with soap and water every day.  It is OK to wash over the dressings: they are waterproof. Remove all surgical dressings on postoperative day #3.  You do not need to replace dressings  over the closed incisions unless you feel more comfortable with a Band-Aid covering it.   Please call our office [phone] if you have further questions.   Sexual Activity Restrictions   Complete by:  As directed    Sexual activity as tolerated.  Do not push through pain.  Pain will protect you from injury.   Sexual Activity Restrictions   Complete by:  As directed    Sexual activity as tolerated.  Do not push through pain.  Pain will protect you from injury.   Walk with assistance   Complete by:  As directed    Walk over an hour a day.  May use a Grega/cane/companion to help with balance and stamina.   Walk with assistance   Complete by:  As directed    Walk over an hour a day.  May use a [place]/cane/companion to help with balance and stamina.      Allergies as of 10/28/2018      Reactions   Elavil [amitriptyline Hcl] Hives   Ketek [telithromycin] Hives      Medication List    TAKE these medications   amLODipine 5 MG tablet Commonly known as:  NORVASC Take 5 mg by mouth daily.   feeding supplement Liqd Take 237 mLs by mouth 2 (two) times daily between meals.   Multivitamin Adult Chew Chew 2 each by mouth daily.   ondansetron 4 MG tablet Commonly known as:  ZOFRAN Take 1 tablet (4 mg total) by mouth every 8 (eight) hours as needed for nausea.   pantoprazole 40 MG tablet Commonly known as:  PROTONIX Take 40 mg by mouth daily.   polyethylene glycol 17 g packet Commonly known as:  MIRALAX / GLYCOLAX Take 17 g by mouth daily.   senna-docusate 8.6-50 MG tablet Commonly known as:  Senokot-S Take 2 tablets by mouth at bedtime as needed for mild constipation.   traMADol  50 MG tablet Commonly known as:  ULTRAM Take 1-2 tablets (50-100 mg total) by mouth every 6 (six) hours as needed for moderate pain or severe pain.            Durable Medical Equipment  (From admission, onward)         Start     Ordered   10/28/18 0741  For home use only DME Shower stool  Once    Comments:  SW or CM can adjust order   10/28/18 0741          Significant Diagnostic Studies:  Results for orders placed or performed during the hospital encounter of 10/25/18 (from the past 72 hour(s))  Basic metabolic panel     Status: Abnormal   Collection Time: 10/27/18  7:47 AM  Result Value Ref Range   Sodium 138 135 - 145 mmol/L   Potassium 2.7 (LL) 3.5 - 5.1 mmol/L    Comment: REPEATED TO VERIFY CRITICAL RESULT CALLED TO, READ BACK BY AND VERIFIED WITH: THURMAN,T. RN AT 6160 10/27/18 MULLINS,T    Chloride 101 98 - 111 mmol/L   CO2 27 22 - 32 mmol/L   Glucose, Bld 125 (H) 70 - 99 mg/dL   BUN 13 8 - 23 mg/dL   Creatinine, Ser 0.87 0.44 - 1.00 mg/dL   Calcium 9.4 8.9 - 10.3 mg/dL   GFR calc non Af Amer >60 >60 mL/min   GFR calc Af Amer >60 >60 mL/min   Anion gap 10 5 - 15    Comment: Performed at [facility], [address] [place], [place] [address]  Magnesium     Status: Abnormal   Collection Time: 10/27/18  7:47 AM  Result Value Ref Range   Magnesium 1.6 (L) 1.7 - 2.4 mg/dL    Comment: Performed at [facility], [place] [address], [place], [place] [address]  Potassium     Status: None   Collection Time: 10/28/18  3:40 AM  Result Value Ref Range   Potassium 4.0 3.5 - 5.1 mmol/L    Comment: DELTA CHECK NOTED REPEATED TO VERIFY NO VISIBLE HEMOLYSIS Performed at [facility] [address], [place], [place] [address]     No results found.  Past Medical History:  Diagnosis Date  . Acquired diverticulum of distal esophagus with dysphagia 10/03/2018  . AKI (acute kidney injury) ([place]) 08/31/2018  .  Anxiety   . Bradycardia 02/16/2014  . Chest tightness   . DDD (degenerative disc disease), lumbar   . DJD (degenerative joint disease)   . Dry eyes   . Esophageal stenosis 08/31/2018  . Facial pain   . Fibromyalgia   . Hemorrhoid   . Hypertension   . Hypokalemia 08/31/2018  . Hypothyroidism   . Lightheadedness   . Near syncope 02/16/2014  . Osteopenia   . Sigmoid diverticulosis   . Thyroid nodule    PT DENIES    Past Surgical History:  Procedure Laterality Date  . ABDOMINAL HYSTERECTOMY     COMPLETE  . BALLOON DILATION N/A 09/01/2018   Procedure: BALLOON DILATION;  Surgeon: Ronnette Juniper, MD;  Location: [place];  Service: Gastroenterology;  Laterality: N/A;  . BIOPSY  09/01/2018   Procedure: BIOPSY;  Surgeon: Ronnette Juniper, MD;  Location: [facility] ENDOSCOPY;  Service: Gastroenterology;;  . Lum Keas INJECTION  09/01/2018   Procedure: BOTOX INJECTION;  Surgeon: Ronnette Juniper, MD;  Location: [place];  Service: Gastroenterology;;  . BREAST SURGERY     breast biopsy  . CHOLECYSTECTOMY    . ESOPHAGEAL MANOMETRY N/A 10/07/2018   Procedure: ESOPHAGEAL MANOMETRY (EM);  Surgeon: Ronnette Juniper, MD;  Location: [facility] ENDOSCOPY;  Service: Gastroenterology;  Laterality: N/A;  . ESOPHAGOGASTRODUODENOSCOPY N/A 10/25/2018   Procedure: ESOPHAGOGASTRODUODENOSCOPY (EGD);  Surgeon: Michael Boston, MD;  Location: [facility];  Service: General;  Laterality: N/A;  . ESOPHAGOGASTRODUODENOSCOPY (EGD) WITH PROPOFOL N/A 09/01/2018   Procedure: ESOPHAGOGASTRODUODENOSCOPY (EGD) WITH PROPOFOL;  Surgeon: Ronnette Juniper, MD;  Location: [facility];  Service: Gastroenterology;  Laterality: N/A;    Social History   Socioeconomic History  . Marital status: Widowed    Spouse name: Not on file  . Number of children: Not on file  . Years of education: Not on file  . Highest education level: Not on file  Occupational History  . Not on file  Social Needs  . Financial resource strain: Not on file  . Food insecurity:    Worry: Not on  file    Inability: Not on file  . Transportation needs:    Medical: Not on file    Non-medical: Not on file  Tobacco Use  . Smoking status: Never Smoker  . Smokeless tobacco: Never Used  Substance and Sexual Activity  . Alcohol use: No  . Drug use: No  . Sexual activity: Never  Lifestyle  . Physical activity:    Days per week: Not on file    Minutes per session: Not on file  . Stress: Not on file  Relationships  . Social connections:    Talks on phone: Not on file  Gets together: Not on file    Attends religious service: Not on file    Active member of club or organization: Not on file    Attends meetings of clubs or organizations: Not on file    Relationship status: Not on file  . Intimate partner violence:    Fear of current or ex partner: Not on file    Emotionally abused: Not on file    Physically abused: Not on file    Forced sexual activity: Not on file  Other Topics Concern  . Not on file  Social History Narrative  . Not on file    Family History  Problem Relation Age of Onset  . Hypertension Mother   . Diabetes Mother        non-insulin dependant  . CAD Brother     Current Facility-Administered Medications  Medication Dose Route Frequency Provider Last Rate Last Dose  . 0.9 %  sodium chloride infusion  250 mL Intravenous PRN Michael Boston, MD      . acetaminophen (TYLENOL) tablet 1,000 mg  1,000 mg Oral Tor Netters, MD   1,000 mg at 10/27/18 0558  . albumin human 5 % solution 12.5 g  12.5 g Intravenous Q6H PRN Michael Boston, MD      . amLODipine (NORVASC) tablet 5 mg  5 mg Oral Daily Michael Boston, MD   5 mg at 10/27/18 1152  . bisacodyl (DULCOLAX) suppository 10 mg  10 mg Rectal Daily PRN Michael Boston, MD      . dexamethasone (DECADRON) injection 4 mg  4 mg Intravenous Gorden Harms, MD   4 mg at 10/27/18 2136  . diphenhydrAMINE (BENADRYL) 12.5 MG/5ML elixir 12.5 mg  12.5 mg Oral Q6H PRN Michael Boston, MD       Or  . diphenhydrAMINE  (BENADRYL) injection 12.5 mg  12.5 mg Intravenous Q6H PRN Michael Boston, MD      . enoxaparin (LOVENOX) injection 40 mg  40 mg Subcutaneous Q24H Michael Boston, MD   40 mg at 10/27/18 0904  . gabapentin (NEURONTIN) 250 MG/5ML solution 300 mg  300 mg Oral Gorden Harms, MD   300 mg at 10/27/18 2135  . hydrALAZINE (APRESOLINE) injection 5-20 mg  5-20 mg Intravenous Q4H PRN Michael Boston, MD      . HYDROmorphone (DILAUDID) injection 0.5-2 mg  0.5-2 mg Intravenous Q2H PRN Michael Boston, MD      . lip balm (CARMEX) ointment 1 application  1 application Topical BID Michael Boston, MD   1 application at 73/41/93 2136  . magic mouthwash  15 mL Oral QID PRN Michael Boston, MD      . methocarbamol (ROBAXIN) tablet 750 mg  750 mg Oral Q6H PRN Michael Boston, MD      . metoprolol tartrate (LOPRESSOR) injection 5 mg  5 mg Intravenous Q6H PRN Michael Boston, MD      . ondansetron (ZOFRAN-ODT) disintegrating tablet 4 mg  4 mg Oral Q6H PRN Michael Boston, MD       Or  . ondansetron (ZOFRAN) injection 4 mg  4 mg Intravenous Q6H PRN Michael Boston, MD      . pantoprazole sodium (PROTONIX) 40 mg/20 mL oral suspension 40 mg  40 mg Oral Daily Nickolaus Bordelon, Remo Lipps, MD      . polyethylene glycol (MIRALAX / GLYCOLAX) packet 17 g  17 g Oral Daily PRN Michael Boston, MD      . potassium chloride 20 MEQ/15ML (10%) solution 40 mEq  40  mEq Oral Daily Michael Boston, MD   40 mEq at 10/27/18 1156  . prochlorperazine (COMPAZINE) tablet 10 mg  10 mg Oral Q6H PRN Michael Boston, MD       Or  . prochlorperazine (COMPAZINE) injection 5-10 mg  5-10 mg Intravenous Q6H PRN Michael Boston, MD      . simethicone (MYLICON) chewable tablet 40 mg  40 mg Oral Q6H PRN Michael Boston, MD      . sodium chloride flush (NS) 0.9 % injection 3 mL  3 mL Intravenous Gorden Harms, MD   3 mL at 10/27/18 2141  . sodium chloride flush (NS) 0.9 % injection 3 mL  3 mL Intravenous PRN Michael Boston, MD   3 mL at 10/26/18 2156  . traMADol (ULTRAM) tablet 50-100 mg   50-100 mg Oral Q6H PRN Michael Boston, MD      . zolpidem (AMBIEN) tablet 5 mg  5 mg Oral QHS PRN Michael Boston, MD         Allergies  Allergen Reactions  . Elavil [Amitriptyline Hcl] Hives  . Ketek [Telithromycin] Hives    Signed: Morton Peters, MD, FACS, MASCRS Gastrointestinal and Minimally Invasive Surgery    [address], [place], [place] [address] [phone] Main / Paging [phone] Fax   10/28/2018, 7:45 AM

## 2018-10-28 NOTE — Care Management Important Message (Signed)
Important Message  Patient Details IM Letter given to Kathrin Greathouse SW to present to the Patient Name: Shannon Ortiz MRN: [id number] Date of Birth: [birth year]   Medicare Important Message Given:  Yes    Kerin Salen 10/28/2018, 10:21 AM

## 2018-10-28 NOTE — Discharge Instructions (Signed)
EATING AFTER YOUR ESOPHAGEAL SURGERY (Stomach Fundoplication, Hiatal Hernia repair, Achalasia surgery, etc)  ######################################################################  EAT Start with a pureed / full liquid diet (see below) Gradually transition to a high fiber diet with a fiber supplement over the next month after discharge.    WALK Walk an hour a day.  Control your pain to do that.    CONTROL PAIN Control pain so that you can walk, sleep, tolerate sneezing/coughing, go up/down stairs.  HAVE A BOWEL MOVEMENT DAILY Keep your bowels regular to avoid problems.  OK to try a laxative to override constipation.  OK to use an antidairrheal to slow down diarrhea.  Call if not better after 2 tries  CALL IF YOU HAVE PROBLEMS/CONCERNS Call if you are still struggling despite following these instructions. Call if you have concerns not answered by these instructions  ######################################################################   After your esophageal surgery, expect some sticking with swallowing over the next 1-2 months.    If food sticks when you eat, it is called "dysphagia".  This is due to swelling around your esophagus at the wrap & hiatal diaphragm repair.  It will gradually ease off over the next few months.  To help you through this temporary phase, we start you out on a pureed (blenderized) diet = LEVEL 1 (see below).  Your first meal in the hospital was thin liquids.  You should have been given a pureed diet by the time you left the hospital.  We ask patients to stay on a pureed diet for the first 2-3 weeks to avoid anything getting "stuck" near your recent surgery.  Don't be alarmed if your ability to swallow doesn't progress according to this plan.  Everyone is different and some diets can advance more or less quickly.     Some BASIC RULES to follow are:  Maintain an upright position whenever eating or drinking.  Take small bites - just a teaspoon size bite at  a time.  Eat slowly.  It may also help to eat only one food at a time.  Consider nibbling through smaller, more frequent meals & avoid the urge to eat BIG meals  Do not push through feelings of fullness, nausea, or bloatedness  Do not mix solid foods and liquids in the same mouthful  Try not to "wash foods down" with large gulps of liquids.  Avoid carbonated (bubbly/fizzy) drinks.    Avoid foods that make you feel gassy or bloated.  Start with bland foods first.  Wait on trying greasy, fried, or spicy meals until you are tolerating more bland solids well.  Understand that it will be hard to burp and belch at first.  This gradually improves with time.  Expect to be more gassy/flatulent/bloated initially.  Walking will help your body manage it better.  Consider using medications for bloating that contain simethicone such as  Maalox or Gas-X   Eat in a relaxed atmosphere & minimize distractions.  Avoid talking while eating.    Do not use straws.  Following each meal, sit in an upright position (90 degree angle) for 60 to 90 minutes.  Going for a short walk can help as well  If food does stick, don't panic.  Try to relax and let the food pass on its own.  Sipping WARM LIQUID such as strong hot black tea can also help slide it down.   Be gradual in changes & use common sense:  -If you easily tolerating a certain "level" of foods, advance to the next  level gradually -If you are having trouble swallowing a particular food, then avoid it.   -If food is sticking when you advance your diet, go back to thinner previous diet (the lower LEVEL) for 1-2 days.  LEVEL 1 = PUREED DIET  Do for the first 2 WEEKS AFTER SURGERY  -Foods in this group are pureed or blenderized to a smooth, mashed potato-like consistency.  -If necessary, the pureed foods can keep their shape with the addition of a thickening agent.   -Meat should be pureed to a smooth, pasty consistency.  Hot broth or gravy may be  added to the pureed meat, approximately 1 oz. of liquid per 3 oz. serving of meat. -CAUTION:  If any foods do not puree into a smooth consistency, swallowing will be more difficult.  (For example, nuts or seeds sometimes do not blend well.)  Hot Foods Cold Foods  Pureed scrambled eggs and cheese Pureed cottage cheese  Baby cereals Thickened juices and nectars  Thinned cooked cereals (no lumps) Thickened milk or eggnog  Pureed [place] toast or pancakes Ensure  Mashed potatoes Ice cream  Pureed parsley, au gratin, scalloped potatoes, candied sweet potatoes Fruit or [place] ice, sherbet  Pureed buttered or alfredo noodles Plain yogurt  Pureed vegetables (no corn or peas) Instant breakfast  Pureed soups and creamed soups Smooth pudding, mousse, custard  Pureed scalloped apples Whipped gelatin  Gravies Sugar, syrup, honey, jelly  Sauces, cheese, tomato, barbecue, white, creamed Cream  Any baby food Creamer  Alcohol in moderation (not beer or champagne) Margarine  Coffee or tea Mayonnaise   Ketchup, mustard   Apple sauce   SAMPLE MENU:  PUREED DIET Breakfast Lunch Dinner   [place]ange juice, 1/2 cup  Cream of wheat, 1/2 cup  Pineapple juice, 1/2 cup  Pureed [place], barley soup, 3/4 cup  Pureed [place] chicken, 3 oz   Scrambled eggs, mashed or blended with cheese, 1/2 cup  Tea or coffee, 1 cup   Whole milk, 1 cup   Non-dairy creamer, 2 Tbsp.  Mashed potatoes, 1/2 cup  Pureed cooled broccoli, 1/2 cup  Apple sauce, 1/2 cup  Coffee or tea  Mashed potatoes, 1/2 cup  Pureed spinach, 1/2 cup  Frozen yogurt, 1/2 cup  Tea or coffee      LEVEL 2 = SOFT DIET  After your first 2 weeks, you can advance to a soft diet.   Keep on this diet until everything goes down easily.  Hot Foods Cold Foods  White fish Cottage cheese  Stuffed fish Junior baby fruit  Baby food meals Semi thickened juices  Minced soft cooked, scrambled, poached eggs nectars  Souffle & omelets Ripe mashed  bananas  Cooked cereals Canned fruit, pineapple sauce, milk  potatoes Milkshake  Buttered or Alfredo noodles Custard  Cooked cooled vegetable Puddings, including tapioca  Sherbet Yogurt  Vegetable soup or alphabet soup Fruit ice, [place] ice  Gravies Whipped gelatin  Sugar, syrup, honey, jelly Junior baby desserts  Sauces:  Cheese, creamed, barbecue, tomato, white Cream  Coffee or tea Margarine   SAMPLE MENU:  LEVEL 2 Breakfast Lunch Dinner   [place] juice, 1/2 cup  Oatmeal, 1/2 cup  Scrambled eggs with cheese, 1/2 cup  Decaffeinated tea, 1 cup  Whole milk, 1 cup  Non-dairy creamer, 2 Tbsp  Pineapple juice, 1/2 cup  Minced beef, 3 oz  Gravy, 2 Tbsp  Mashed potatoes, 1/2 cup  Minced fresh broccoli, 1/2 cup  Applesauce, 1/2 cup  Coffee, 1 cup  [place],  barley soup, 3/4 cup  Minced Hawaiian chicken, 3 oz  Mashed potatoes, 1/2 cup  Cooked spinach, 1/2 cup  Frozen yogurt, 1/2 cup  Non-dairy creamer, 2 Tbsp      LEVEL 3 = CHOPPED DIET  -After all the foods in level 2 (soft diet) are passing through well you should advance up to more chopped foods.  -It is still important to cut these foods into small pieces and eat slowly.  Hot Foods Cold Foods  Poultry Cottage cheese  Chopped Swedish meatballs Yogurt  Meat salads (ground or flaked meat) Milk  Flaked fish (tuna) Milkshakes  Poached or scrambled eggs Soft, cold, dry cereal  Souffles and omelets Fruit juices or nectars  Cooked cereals Chopped canned fruit  Chopped [place] toast or pancakes Canned fruit cocktail  Noodles or pasta (no rice) Pudding, mousse, custard  Cooked vegetables (no frozen peas, corn, or mixed vegetables) Green salad  Canned small sweet peas Ice cream  Creamed soup or vegetable soup Fruit ice, [place] ice  Pureed vegetable soup or alphabet soup Non-dairy creamer  Ground scalloped apples Margarine  Gravies Mayonnaise  Sauces:  Cheese, creamed, barbecue, tomato, white Ketchup  Coffee  or tea Mustard   SAMPLE MENU:  LEVEL 3 Breakfast Lunch Dinner   [place] juice, 1/2 cup  Oatmeal, 1/2 cup  Scrambled eggs with cheese, 1/2 cup  Decaffeinated tea, 1 cup  Whole milk, 1 cup  Non-dairy creamer, 2 Tbsp  Ketchup, 1 Tbsp  Margarine, 1 tsp  Salt, 1/4 tsp  Sugar, 2 tsp  Pineapple juice, 1/2 cup  Ground beef, 3 oz  Gravy, 2 Tbsp  Mashed potatoes, 1/2 cup  Cooked spinach, 1/2 cup  Applesauce, 1/2 cup  Decaffeinated coffee  Whole milk  Non-dairy creamer, 2 Tbsp  Margarine, 1 tsp  Salt, 1/4 tsp  Pureed [place], barley soup, 3/4 cup  Barbecue chicken, 3 oz  Mashed potatoes, 1/2 cup  Ground fresh broccoli, 1/2 cup  Frozen yogurt, 1/2 cup  Decaffeinated tea, 1 cup  Non-dairy creamer, 2 Tbsp  Margarine, 1 tsp  Salt, 1/4 tsp  Sugar, 1 tsp    LEVEL 4:  REGULAR FOODS  -Foods in this group are soft, moist, regularly textured foods.   -This level includes meat and breads, which tend to be the hardest things to swallow.   -Eat very slowly, chew well and continue to avoid carbonated drinks. -most people are at this level in 4-6 weeks  Hot Foods Cold Foods  Baked fish or skinned Soft cheeses - cottage cheese  Souffles and omelets Cream cheese  Eggs Yogurt  Stuffed shells Milk  Spaghetti with meat sauce Milkshakes  Cooked cereal Cold dry cereals (no nuts, dried fruit, coconut)  [place] toast or pancakes Crackers  Buttered toast Fruit juices or nectars  Noodles or pasta (no rice) Canned fruit  Potatoes (all types) Ripe bananas  Soft, cooked vegetables (no corn, lima, or baked beans) Peeled, ripe, fresh fruit  Creamed soups or vegetable soup Cakes (no nuts, dried fruit, coconut)  Canned chicken noodle soup Plain doughnuts  Gravies Ice cream  Bacon dressing Pudding, mousse, custard  Sauces:  Cheese, creamed, barbecue, tomato, white Fruit ice, [place] ice, sherbet  Decaffeinated tea or coffee Whipped gelatin  Pork chops Regular gelatin    Canned fruited gelatin molds   Sugar, syrup, honey, jam, jelly   Cream   Non-dairy   Margarine   Oil   Mayonnaise   Ketchup   Mustard   TROUBLESHOOTING IRREGULAR BOWELS  1) Avoid extremes of bowel movements (no bad constipation/diarrhea)  2) Miralax 17gm mixed in 8oz. water or juice-daily. May use BID as needed.  3) Gas-x,Phazyme, etc. as needed for gas & bloating.  4) Soft,bland diet. No spicy,greasy,fried foods.  5) Prilosec over-the-counter as needed  6) May hold gluten/wheat products from diet to see if symptoms improve.  7) May try probiotics (Align, Activa, etc) to help calm the bowels down  7) If symptoms become worse call back immediately.    If you have any questions please call our office at [place]: [phone].   LAPAROSCOPIC SURGERY: POST OP INSTRUCTIONS  ######################################################################  EAT Gradually transition to a high fiber diet with a fiber supplement over the next few weeks after discharge.  Start with a pureed / full liquid diet (see below)  WALK Walk an hour a day.  Control your pain to do that.    CONTROL PAIN Control pain so that you can walk, sleep, tolerate sneezing/coughing, go up/down stairs.  HAVE A BOWEL MOVEMENT DAILY Keep your bowels regular to avoid problems.  OK to try a laxative to override constipation.  OK to use an antidairrheal to slow down diarrhea.  Call if not better after 2 tries  CALL IF YOU HAVE PROBLEMS/CONCERNS Call if you are still struggling despite following these instructions. Call if you have concerns not answered by these instructions  ######################################################################    1. DIET: Follow a light bland diet the first 24 hours after arrival home, such as soup, liquids, crackers, etc.  Be sure to include lots of fluids daily.  Avoid fast food or heavy meals as your are more likely to get nauseated.  Eat a low fat the next few  days after surgery.    2. Take your usually prescribed home medications unless otherwise directed.  3. PAIN CONTROL: a. Pain is best controlled by a usual combination of three different methods TOGETHER: i. Ice/Heat ii. Over the counter pain medication iii. Prescription pain medication b. Most patients will experience some swelling and bruising around the incisions.  Ice packs or heating pads (30-60 minutes up to 6 times a day) will help. Use ice for the first few days to help decrease swelling and bruising, then switch to heat to help relax tight/sore spots and speed recovery.  Some people prefer to use ice alone, heat alone, alternating between ice & heat.  Experiment to what works for you.  Swelling and bruising can take several weeks to resolve.   c. It is helpful to take an over-the-counter pain medication regularly for the first few weeks.  Choose one of the following that works best for you: i. Naproxen (Aleve, etc)  Two 220mg  tabs twice a day ii. Ibuprofen (Advil, etc) Three 200mg  tabs four times a day (every meal & bedtime) iii. Acetaminophen (Tylenol, etc) 500-650mg  four times a day (every meal & bedtime) d. A  prescription for pain medication (such as oxycodone, hydrocodone, tramadol, gabapentin, methocarbamol, etc) should be given to you upon discharge.  Take your pain medication as prescribed.  i. If you are having problems/concerns with the prescription medicine (does not control pain, nausea, vomiting, rash, itching, etc), please call us [phone] to see if we need to switch you to a different pain medicine that will work better for you and/or control your side effect better. ii. If you need a refill on your pain medication, please give [place] 48 hour notice.  contact your pharmacy.  They will contact our office  to request authorization. Prescriptions will not be filled after 5 pm or on week-ends  4. Avoid getting constipated.   a. Between the surgery and the pain medications, it  is common to experience some constipation.   b. Increasing fluid intake and taking a fiber supplement (such as Metamucil, Citrucel, FiberCon, MiraLax, etc) 1-2 times a day regularly will usually help prevent this problem from occurring.   c. A mild laxative (prune juice, Milk of Magnesia, MiraLax, etc) should be taken according to package directions if there are no bowel movements after 48 hours.   5. Watch out for diarrhea.   a. If you have many loose bowel movements, simplify your diet to bland foods & liquids for a few days.   b. Stop any stool softeners and decrease your fiber supplement.   c. Switching to mild anti-diarrheal medications (Kayopectate, Pepto Bismol) can help.   d. If this worsens or does not improve, please call us.  6. Wash / shower every day.  You may shower over the dressings as they are waterproof.  Continue to shower over incision(s) after the dressing is off.  7. Remove your waterproof bandages 5 days after surgery.  You may leave the incision open to air.  You may replace a dressing/Band-Aid to cover the incision for comfort if you wish.   8. ACTIVITIES as tolerated:   a. You may resume regular (light) daily activities beginning the next day--such as daily self-care, walking, climbing stairs--gradually increasing activities as tolerated.  If you can walk 30 minutes without difficulty, it is safe to try more intense activity such as jogging, treadmill, bicycling, low-impact aerobics, swimming, etc. b. Save the most intensive and strenuous activity for last such as sit-ups, heavy lifting, contact sports, etc  Refrain from any heavy lifting or straining until you are off narcotics for pain control.   c. DO NOT PUSH THROUGH PAIN.  Let pain be your guide: If it hurts to do something, don't do it.  Pain is your body warning you to avoid that activity for another week until the pain goes down. d. You may drive when you are no longer taking prescription pain medication, you can  comfortably wear a seatbelt, and you can safely maneuver your car and apply brakes. e. Dennis Bast may have sexual intercourse when it is comfortable.  9. FOLLOW UP in our office a. Please call [facility] at [phone] to set up an appointment to see your surgeon in the office for a follow-up appointment approximately 2-3 weeks after your surgery. b. Make sure that you call for this appointment the day you arrive home to insure a convenient appointment time.  10. IF YOU HAVE DISABILITY OR FAMILY LEAVE FORMS, BRING THEM TO THE OFFICE FOR PROCESSING.  DO NOT GIVE THEM TO YOUR DOCTOR.   WHEN TO CALL us [phone]: 1. Poor pain control 2. Reactions / problems with new medications (rash/itching, nausea, etc)  3. Fever over 101.5 F (38.5 C) 4. Inability to urinate 5. Nausea and/or vomiting 6. Worsening swelling or bruising 7. Continued bleeding from incision. 8. Increased pain, redness, or drainage from the incision   The clinic staff is available to answer your questions during regular business hours (8:30am-5pm).  Please dont hesitate to call and ask to speak to one of our nurses for clinical concerns.   If you have a medical emergency, go to the nearest emergency room or call 911.  A surgeon from [facility] is always on call at  the [facility], [place], [place], [place], [place]  [address] ? MAIN: [phone] ? TOLL FREE: [phone] ?  FAX [phone] [email or web address]   Additional Information Regarding Pureed Post-Op Diet  A pureed diet consists of foods that are easy to swallow because they are blended, whipped, or mashed until they are a lump-free puree texture. A registered dietitian nutritionist can individualize this diet to provide some favorite food items in a modified form.  Tips:  Puree all foods you eat. Foods should not have lumps or require any chewing.  Add small amounts of gravy, sauce,  vegetable juice or cooking water, fruit juice, milk, or half and half to foods, then puree them to the consistency of pudding.  Add potato flakes or commercial thickeners to pureed foods that are too thin.  Strain pureed or blended foods to remove lumps, chunks, pulp, or seeds.  Add dry milk powder to food to increase the calories and protein in that food.  Prepare quantities of favorite food items and freeze them in portion sizes for use later.  Reheat foods carefully so that a tough outer crust does not form on them.   Food Group Foods Recommended  Grains Soft pancakes, breads, sweet rolls, [place] pastries, [place] toasts, muffins, bread dressing, that have been pureed to a pudding consistency. Commercial pureed bread mixes. Well-cooked pasta, noodles, and rice that have been pureed into a pudding consistency. Pureed oatmeal. Smooth, cooked cereals (farina, cream of wheat) with small amounts of milk. Soft, moist cakes with icing dissolved in milk or juice to form a slurry. Cookies softened with milk, coffee, or other liquid to form a slurry.   Protein Foods Pureed prepared, moistened red meat, beef, pork, or lamb. Gravy and sauce to add moisture. Pureed prepared, moistened poultry, including chicken or [place]. Gravy and sauce to add moisture. Pureed prepared, moistened seafood, including fish (salmon, herring, and sardines), shrimp, lobster, clams, and scallops. Gravy and sauce to add moisture. Pureed prepared moistened ham, hot dogs and deli meats. Pureed tuna, egg, or meat salad Pureed poached, egg dishes such as scrambled, or soft-cooked eggs or egg substitute mashed with butter, margarine, sauce, or gravy. Pureed smooth souffls Pureed smooth casseroles. Pureed, moist macaroni and cheese, well-cooked pasta with meat sauce, tuna-noodle casserole, soft and moist lasagna. Smooth, nut and seed butters, such as peanut butter, almond butter, and sunflower seed butter if pureed within  recipe. Pureed, prepared, moistened, and ground soy foods, such as tofu or tempeh Pureed, prepared, moistened meat alternatives, such as veggie burgers, and sausages based on plant protein Pureed, prepared, and moistened legumes, such as dried beans, lentils, or peas.  Dairy Milk, plain yogurt (without nuts or coconut), sour cream, and whipped topping. Fortified soymilk or milk are acceptable components to add moisture. Pureed cottage cheese and cream cheese. Frozen desserts made from milk such as smooth pudding, custard, ice cream, sherbet, malts, and frozen yogurt.  Vegetables All pureed, cooked, canned or frozen, tender vegetables including dark-green, red and orange vegetables, legumes (beans and peas), and starchy vegetables. Tomato sauce or tomato paste without seeds. Mashed or pureed potatoes without skins seasoned with gravy, butter, margarine, or sour cream.  Fruits All soft, drained pureed fruits including canned and cooked fruits (without skins). Gelatin with canned fruit (except pineapple) that is pureed together. Well-mashed banana or avocado; pureed fresh watermelon without seeds. 100% fruit juice in consistency recommended by a speech therapist.  Oils Vegetable oils, including olive,  peanut, and canola oils; margarines and spreads); salad dressing and mayonnaise, butter, strained gravy, cream sauces -white sauce, cheese sauce, hollandaise sauce (for the addition of moisture to foods).  Beverages Coffee, tea, water, 100% fruit juice in the liquid consistency recommended by a speech therapist  Other Puree prepared foods, including all blenderized and strained soups, casseroles, baked goods, and snacks made from recommended ingredients. Smooth pureed sandwiches and pizza. All finely ground seasonings and sweeteners, jelly Pureed desserts, gelatin, popsicle and pudding pops.   Foods Not Recommended  Food Group Foods Not Recommended  Grains Breads, rolls, crackers, biscuits,  pancakes, waffles, [place] toast, muffins, and bread dressing, pasta, noodles, and rice that have not been pureed to pudding consistency. Dry cereals, oatmeal, or cooked cereals with lumps, seeds, or chunks. Dry or coarse cakes, cookies, pies, pastries, bread or rice puddings, or coarse puddings that have not been pureed to pudding consistency. Breads and starches such as biscuits, frozen waffles, sweet breads, doughnuts, pastries, packaged baking mixes, pancakes, cakes, and cookies that have not been pureed to pudding consistency.  Protein Foods Tough, dry, whole, or ground red meat (beef, pork, lamb). Tough, dry, whole, or ground poultry (chicken and [place]). Tough, dry, whole, or ground fried meats, poultry, or fish. Whole or ground deli meats, such as pastrami, bologna, or salami (made of meat or poultry) Tough, dry, whole, or ground processed meats, such as bacon, sausage, hot dogs and ham Non-pureed fried, scrambled, or hard-cooked eggs Non-pureed or dry casseroles. Whole nuts or seeds. All nut butters if not pureed unless used in an existing recipe that is tolerated. Whole, ground, processed meat alternatives  Dairy Cheese slices or cubes. Non-pureed yogurt or ice cream made with fruit pieces, nuts, or coconut. All cheese except pureed cottage cheese or cheese used in a recipe that is pureed.  Vegetables Any fresh, frozen, canned, or dried vegetables that have not been pureed Non-pureed tomatoes or tomato sauce with seeds Fried vegetables such as potato skins, potato chips or other crunchy vegetable chips, fried or french-fried potatoes, tough or crisp-fried potatoes.  Fruits All non-pureed whole fresh, frozen, canned, or dried fruits that have not been pureed. All pineapple, coconut, or watermelon with seeds. Fruit leather, fruit roll-ups, fruit snacks, dried fruits.  Oils All fats with coarse or chunky additives.  Beverages Liquid consistencies other than those specified by a  speech therapist.  Other Soups that have lumps, chunks, and are not pureed smooth. Coarsely ground pepper or herbs. Sweets that are not pureed; chunky fruit preserves or seeded jams Sandwiches, pizza (that are not pureed) Nuts, seeds, or sticky foods. Candies with nuts, seeds, or coconut. Chewy caramel, taffy-style candies, or licorice.     Pureed Foods Sample 1-Day Menu Breakfast 1/2 cup orange juice, at prescribed consistency 1/2 cup farina 1/4 cup low-fat milk, for farina 1/2 teaspoon brown sugar, lump-free for farina 1 pureed, scrambled egg 1/2 muffin, pureed 1 teaspoon butter, for muffin  Lunch 1/2 cup pureed tomato soup, made with milk 3 pureed saltine crackers 1/2 cup mashed potatoes 1/4 cup gravy 1/2 cup pureed carrots and peas 1/2 cup vanilla pudding 1/2 cup pureed peaches  Evening Meal 1/2 cup pureed potato soup made with milk 3 pureed saltine crackers 1/2 cup pureed green beans 1/2 cup pureed applesauce 6 oz smooth, whipped fruit-flavored yogurt   Follow a pureed diet for 2-3 weeks post-op.

## 2018-10-28 NOTE — Progress Notes (Signed)
Pt alert, oriented, tolerating diet.  D/C instructions given, all questions answered.  Pt d/cd home. 

## 2018-12-26 ENCOUNTER — Observation Stay (HOSPITAL_COMMUNITY)
Admission: EM | Admit: 2018-12-26 | Discharge: 2018-12-27 | Disposition: A | Discharge status: Home / Self Care | Payer: Medicare Other | Attending: Family Medicine | Admitting: Family Medicine

## 2018-12-26 ENCOUNTER — Other Ambulatory Visit: Payer: Self-pay

## 2018-12-26 ENCOUNTER — Encounter (HOSPITAL_COMMUNITY): Payer: Self-pay | Admitting: Emergency Medicine

## 2018-12-26 ENCOUNTER — Emergency Department (HOSPITAL_COMMUNITY): Payer: Medicare Other

## 2018-12-26 DIAGNOSIS — E876 Hypokalemia: Secondary | ICD-10-CM

## 2018-12-26 DIAGNOSIS — E039 Hypothyroidism, unspecified: Secondary | ICD-10-CM | POA: Insufficient documentation

## 2018-12-26 DIAGNOSIS — D649 Anemia, unspecified: Secondary | ICD-10-CM | POA: Diagnosis not present

## 2018-12-26 DIAGNOSIS — Z03818 Encounter for observation for suspected exposure to other biological agents ruled out: Secondary | ICD-10-CM | POA: Diagnosis not present

## 2018-12-26 DIAGNOSIS — I10 Essential (primary) hypertension: Secondary | ICD-10-CM | POA: Insufficient documentation

## 2018-12-26 DIAGNOSIS — M6281 Muscle weakness (generalized): Secondary | ICD-10-CM | POA: Diagnosis not present

## 2018-12-26 DIAGNOSIS — R7989 Other specified abnormal findings of blood chemistry: Secondary | ICD-10-CM | POA: Diagnosis present

## 2018-12-26 DIAGNOSIS — R778 Other specified abnormalities of plasma proteins: Secondary | ICD-10-CM

## 2018-12-26 DIAGNOSIS — R531 Weakness: Secondary | ICD-10-CM

## 2018-12-26 DIAGNOSIS — R5383 Other fatigue: Secondary | ICD-10-CM

## 2018-12-26 LAB — CBC
HCT: 29.5 % — ABNORMAL LOW (ref 36.0–46.0)
Hemoglobin: 9.8 g/dL — ABNORMAL LOW (ref 12.0–15.0)
MCH: 24.3 pg — ABNORMAL LOW (ref 26.0–34.0)
MCHC: 33.2 g/dL (ref 30.0–36.0)
MCV: 73 fL — ABNORMAL LOW (ref 80.0–100.0)
Platelets: 245 10*3/uL (ref 150–400)
RBC: 4.04 MIL/uL (ref 3.87–5.11)
RDW: 18.6 % — ABNORMAL HIGH (ref 11.5–15.5)
WBC: 4.6 10*3/uL (ref 4.0–10.5)
nRBC: 0 % (ref 0.0–0.2)

## 2018-12-26 LAB — URINALYSIS, ROUTINE W REFLEX MICROSCOPIC
Bilirubin Urine: NEGATIVE
Glucose, UA: NEGATIVE mg/dL
Hgb urine dipstick: NEGATIVE
Ketones, ur: 5 mg/dL — AB
Leukocytes,Ua: NEGATIVE
Nitrite: NEGATIVE
Protein, ur: NEGATIVE mg/dL
Specific Gravity, Urine: 1.009 (ref 1.005–1.030)
pH: 7 (ref 5.0–8.0)

## 2018-12-26 LAB — BASIC METABOLIC PANEL
Anion gap: 12 (ref 5–15)
BUN: 11 mg/dL (ref 8–23)
CO2: 24 mmol/L (ref 22–32)
Calcium: 10 mg/dL (ref 8.9–10.3)
Chloride: 100 mmol/L (ref 98–111)
Creatinine, Ser: 0.86 mg/dL (ref 0.44–1.00)
GFR calc Af Amer: >60 mL/min (ref >60)
GFR calc non Af Amer: >60 mL/min (ref >60)
Glucose, Bld: 119 mg/dL — ABNORMAL HIGH (ref 70–99)
Potassium: 2.9 mmol/L — ABNORMAL LOW (ref 3.5–5.1)
Sodium: 136 mmol/L (ref 135–145)

## 2018-12-26 LAB — TROPONIN I (HIGH SENSITIVITY)
Troponin I (High Sensitivity): 221 ng/L (ref <18)
Troponin I (High Sensitivity): 202 ng/L (ref <18)

## 2018-12-26 LAB — CBG MONITORING, ED: Glucose-Capillary: 94 mg/dL (ref 70–99)

## 2018-12-26 LAB — SARS CORONAVIRUS 2 BY RT PCR (HOSPITAL ORDER, PERFORMED IN ~~LOC~~ HOSPITAL LAB): SARS Coronavirus 2: NEGATIVE

## 2018-12-26 MED ORDER — POTASSIUM CHLORIDE CRYS ER 20 MEQ PO TBCR
20.0000 meq | EXTENDED_RELEASE_TABLET | Freq: Once | ORAL | Status: AC
  Administered 2018-12-26: 20 meq via ORAL
  Filled 2018-12-26: qty 1

## 2018-12-26 MED ORDER — SODIUM CHLORIDE 0.9% FLUSH: 3.0000 mL | Freq: Once | INTRAVENOUS | Status: DC

## 2018-12-26 MED ORDER — POTASSIUM CHLORIDE CRYS ER 20 MEQ PO TBCR
40.0000 meq | EXTENDED_RELEASE_TABLET | Freq: Once | ORAL | Status: AC
  Administered 2018-12-26: 40 meq via ORAL
  Filled 2018-12-26: qty 2

## 2018-12-26 NOTE — ED Notes (Signed)
ED TO INPATIENT HANDOFF REPORT  ED Nurse Name and Phone #: [phone] Shannon Ortiz  S Name/Age/Gender Shannon Ortiz 83 y.o. female Room/Bed: 020C/020C  Code Status   Code Status: Prior  Home/SNF/Other Home Patient oriented to: self, place, time and situation Is this baseline? Yes   Triage Complete: Triage complete  Chief Complaint weakness  Triage Note Pt states she has generalized weakness that started 4days ago. Denies CP/SOB. Some dizziness at times. Denies n/v/d  PT reports feeling weak for 3-4 days and  Denies any V/N/D.  Pt ate some apple sauce and drank water. Pt also provided with warm blanket and hand warmers.  Daughter call and wants PT DC  Home.  Spoke with Son and updated on Pt 's treatment plan.   Allergies Allergies  Allergen Reactions  . Elavil [Amitriptyline Hcl] Hives  . Ketek [Telithromycin] Hives    Level of Care/Admitting Diagnosis ED Disposition    ED Disposition Condition Comment   Admit  Hospital Area: [place] [[id number]]  Level of Care: Telemetry Cardiac [103]  I expect the patient will be discharged within 24 hours: No (not a candidate for 5C-Observation unit)  Covid Evaluation: N/A  Diagnosis: Elevated troponin [174081]  Admitting Physician: Rise Patience [phone]  Attending Physician: Rise Patience [email or web address]  PT Class (Do Not Modify): Observation [104]  PT Acc Code (Do Not Modify): Observation [10022]       B Medical/Surgery History Past Medical History:  Diagnosis Date  . Acquired diverticulum of distal esophagus with dysphagia 10/03/2018  . AKI (acute kidney injury) ([place]) 08/31/2018  . Anxiety   . Bradycardia 02/16/2014  . Chest tightness   . DDD (degenerative disc disease), lumbar   . DJD (degenerative joint disease)   . Dry eyes   . Esophageal stenosis 08/31/2018  . Facial pain   . Fibromyalgia   . Hemorrhoid   . Hypertension   . Hypokalemia 08/31/2018  . Hypothyroidism   . Lightheadedness   .  Near syncope 02/16/2014  . Osteopenia   . Sigmoid diverticulosis   . Thyroid nodule    PT DENIES   Past Surgical History:  Procedure Laterality Date  . ABDOMINAL HYSTERECTOMY     COMPLETE  . BALLOON DILATION N/A 09/01/2018   Procedure: BALLOON DILATION;  Surgeon: Ronnette Juniper, MD;  Location: [facility];  Service: Gastroenterology;  Laterality: N/A;  . BIOPSY  09/01/2018   Procedure: BIOPSY;  Surgeon: Ronnette Juniper, MD;  Location: [facility];  Service: Gastroenterology;;  . Lum [place] INJECTION  09/01/2018   Procedure: BOTOX INJECTION;  Surgeon: Ronnette Juniper, MD;  Location: [place];  Service: Gastroenterology;;  . BREAST SURGERY     breast biopsy  . CHOLECYSTECTOMY    . ESOPHAGEAL MANOMETRY N/A 10/07/2018   Procedure: ESOPHAGEAL MANOMETRY (EM);  Surgeon: Ronnette Juniper, MD;  Location: [facility];  Service: Gastroenterology;  Laterality: N/A;  . ESOPHAGOGASTRODUODENOSCOPY N/A 10/25/2018   Procedure: ESOPHAGOGASTRODUODENOSCOPY (EGD);  Surgeon: Michael Boston, MD;  Location: [facility];  Service: General;  Laterality: N/A;  . ESOPHAGOGASTRODUODENOSCOPY (EGD) WITH PROPOFOL N/A 09/01/2018   Procedure: ESOPHAGOGASTRODUODENOSCOPY (EGD) WITH PROPOFOL;  Surgeon: Ronnette Juniper, MD;  Location: [place];  Service: Gastroenterology;  Laterality: N/A;     A IV Location/Drains/Wounds Patient Lines/Drains/Airways Status   Active Line/Drains/Airways    Name:   Placement date:   Placement time:   Site:   Days:   Peripheral IV 12/26/18 Right;Anterior Forearm   12/26/18    2011    Forearm  less than 1   Incision (Closed) 10/25/18 Abdomen Other (Comment)   10/25/18    1054     62   Incision - 5 Ports Abdomen Right Right;Medial Mid Mid;Upper Left   10/25/18    0820     62          Intake/Output Last 24 hours No intake or output data in the 24 hours ending 12/26/18 2116  Labs/Imaging Results for orders placed or performed during the hospital encounter of 12/26/18 (from the past 48 hour(s))  Basic metabolic  panel     Status: Abnormal   Collection Time: 12/26/18  1:05 PM  Result Value Ref Range   Sodium 136 135 - 145 mmol/L   Potassium 2.9 (L) 3.5 - 5.1 mmol/L   Chloride 100 98 - 111 mmol/L   CO2 24 22 - 32 mmol/L   Glucose, Bld 119 (H) 70 - 99 mg/dL   BUN 11 8 - 23 mg/dL   Creatinine, Ser 0.86 0.44 - 1.00 mg/dL   Calcium 10.0 8.9 - 10.3 mg/dL   GFR calc non Af Amer >60 >60 mL/min   GFR calc Af Amer >60 >60 mL/min   Anion gap 12 5 - 15    Comment: Performed at [facility], [place] [address] [place], [place] [address]  CBC     Status: Abnormal   Collection Time: 12/26/18  1:05 PM  Result Value Ref Range   WBC 4.6 4.0 - 10.5 K/uL   RBC 4.04 3.87 - 5.11 MIL/uL   Hemoglobin 9.8 (L) 12.0 - 15.0 g/dL   HCT 29.5 (L) 36.0 - 46.0 %   MCV 73.0 (L) 80.0 - 100.0 fL   MCH 24.3 (L) 26.0 - 34.0 pg   MCHC 33.2 30.0 - 36.0 g/dL   RDW 18.6 (H) 11.5 - 15.5 %   Platelets 245 150 - 400 K/uL    Comment: REPEATED TO VERIFY   nRBC 0.0 0.0 - 0.2 %    Comment: Performed at [facility], [place] [address], [place], [place] [address]  CBG monitoring, ED     Status: None   Collection Time: 12/26/18  4:11 PM  Result Value Ref Range   Glucose-Capillary 94 70 - 99 mg/dL  Troponin I (High Sensitivity)     Status: Abnormal   Collection Time: 12/26/18  4:48 PM  Result Value Ref Range   Troponin I (High Sensitivity) 221 (HH) <18 ng/L    Comment: CRITICAL RESULT CALLED TO, READ BACK BY AND VERIFIED WITH: A MCOKWN,RN 1807 12/26/2018 WBOND (NOTE) Elevated high sensitivity troponin I (hsTnI) values and significant  changes across serial measurements may suggest ACS but many other  chronic and acute conditions are known to elevate hsTnI results.  Refer to the Links section for chest pain algorithms and additional  guidance. Performed at [facility], [place] [address] [place], [place] [address]   Urinalysis, Routine w reflex microscopic     Status: Abnormal   Collection Time: 12/26/18  5:00 PM   Result Value Ref Range   Color, Urine STRAW (A) YELLOW   APPearance CLEAR CLEAR   Specific Gravity, Urine 1.009 1.005 - 1.030   pH 7.0 5.0 - 8.0   Glucose, UA NEGATIVE NEGATIVE mg/dL   Hgb urine dipstick NEGATIVE NEGATIVE   Bilirubin Urine NEGATIVE NEGATIVE   Ketones, ur 5 (A) NEGATIVE mg/dL   Protein, ur NEGATIVE NEGATIVE mg/dL   Nitrite NEGATIVE NEGATIVE   Leukocytes,Ua NEGATIVE NEGATIVE  Comment: Performed at [facility], [place] [address], [place], [place] [address]  SARS Coronavirus 2 (CEPHEID - Performed in [place] hospital lab), Hosp Order     Status: None   Collection Time: 12/26/18  6:46 PM   Specimen: Nasopharyngeal Swab  Result Value Ref Range   SARS Coronavirus 2 NEGATIVE NEGATIVE    Comment: (NOTE) If result is NEGATIVE SARS-CoV-2 target nucleic acids are NOT DETECTED. The SARS-CoV-2 RNA is generally detectable in upper and lower  respiratory specimens during the acute phase of infection. The lowest  concentration of SARS-CoV-2 viral copies this assay can detect is 250  copies / mL. A negative result does not preclude SARS-CoV-2 infection  and should not be used as the sole basis for treatment or other  patient management decisions.  A negative result may occur with  improper specimen collection / handling, submission of specimen other  than nasopharyngeal swab, presence of viral mutation(s) within the  areas targeted by this assay, and inadequate number of viral copies  (<250 copies / mL). A negative result must be combined with clinical  observations, patient history, and epidemiological information. If result is POSITIVE SARS-CoV-2 target nucleic acids are DETECTED. The SARS-CoV-2 RNA is generally detectable in upper and lower  respiratory specimens dur ing the acute phase of infection.  Positive  results are indicative of active infection with SARS-CoV-2.  Clinical  correlation with patient history and other diagnostic information is  necessary to  determine patient infection status.  Positive results do  not rule out bacterial infection or co-infection with other viruses. If result is PRESUMPTIVE POSTIVE SARS-CoV-2 nucleic acids MAY BE PRESENT.   A presumptive positive result was obtained on the submitted specimen  and confirmed on repeat testing.  While 2019 novel coronavirus  (SARS-CoV-2) nucleic acids may be present in the submitted sample  additional confirmatory testing may be necessary for epidemiological  and / or clinical management purposes  to differentiate between  SARS-CoV-2 and other Sarbecovirus currently known to infect humans.  If clinically indicated additional testing with an alternate test  methodology [phone]) is advised. The SARS-CoV-2 RNA is generally  detectable in upper and lower respiratory sp ecimens during the acute  phase of infection. The expected result is Negative. Fact Sheet for Patients:  StrictlyIdeas.no Fact Sheet for Healthcare Providers: BankingDealers.co.za This test is not yet approved or cleared by the [place] FDA and has been authorized for detection and/or diagnosis of SARS-CoV-2 by FDA under an Emergency Use Authorization (EUA).  This EUA will remain in effect (meaning this test can be used) for the duration of the COVID-19 declaration under Section 564(b)(1) of the Act, 21 U.S.C. section 360bbb-3(b)(1), unless the authorization is terminated or revoked sooner. Performed at [facility], [place] [address], [place], [place] [address]   Troponin I (High Sensitivity)     Status: Abnormal   Collection Time: 12/26/18  7:15 PM  Result Value Ref Range   Troponin I (High Sensitivity) 202 (HH) <18 ng/L    Comment: CRITICAL VALUE NOTED.  VALUE IS CONSISTENT WITH PREVIOUSLY REPORTED AND CALLED VALUE. (NOTE) Elevated high sensitivity troponin I (hsTnI) values and significant  changes across serial measurements may suggest ACS but many  other  chronic and acute conditions are known to elevate hsTnI results.  Refer to the Links section for chest pain algorithms and additional  guidance. Performed at [facility], [place] [address], [place], [place] [address]    Dg Chest  Port 1 View  Result Date: 12/26/2018 CLINICAL DATA:  Generalized weakness starting 4 days ago, some dizziness. Esophageal surgery on May 12th. EXAM: PORTABLE CHEST 1 VIEW COMPARISON:  Chest x-rays dated 10/15/2018 and 02/16/2014. FINDINGS: Stable cardiomegaly. Overall cardiomediastinal silhouette is not significantly changed lungs are clear. No pleural effusion or pneumothorax seen. Osseous structures about the chest are unremarkable. IMPRESSION: No active disease. No evidence of pneumonia or pulmonary edema. Stable cardiomegaly. Electronically Signed   By: Franki Cabot M.D.   On: 12/26/2018 18:53    Pending Labs Unresulted Labs (From admission, onward)   None      Vitals/Pain Today's Vitals   12/26/18 1815 12/26/18 1830 12/26/18 1845 12/26/18 1900  BP: (!) 158/66 (!) 150/74 (!) 158/84 (!) 174/75  Pulse: 88 85 83   Resp:      Temp:      TempSrc:      SpO2: 100% 100% 100%   Weight:      Height:      PainSc:        Isolation Precautions No active isolations  Medications Medications  sodium chloride flush (NS) 0.9 % injection 3 mL (3 mLs Intravenous Not Given 12/26/18 2003)  potassium chloride SA (K-DUR) CR tablet 20 mEq (has no administration in time range)  potassium chloride SA (K-DUR) CR tablet 40 mEq (40 mEq Oral Given 12/26/18 1622)    Mobility walks with device Moderate fall risk   Focused Assessments Musculoskeletal   R Recommendations: See Admitting Provider Note  Report given to:   Additional Notes: Pt was negative for Covid.  Weakness noted since Friday.  Pt is A&O x4

## 2018-12-26 NOTE — ED Triage Notes (Signed)
PT reports feeling weak for 3-4 days and  Denies any V/N/D.

## 2018-12-26 NOTE — ED Notes (Signed)
Daughter lives [phone]

## 2018-12-26 NOTE — ED Triage Notes (Signed)
Spoke with Son and updated on Pt 's treatment plan.

## 2018-12-26 NOTE — ED Notes (Signed)
Pt aware urine sample is needed 

## 2018-12-26 NOTE — ED Triage Notes (Signed)
Pt states she has generalized weakness that started 4days ago. Denies CP/SOB. Some dizziness at times. Denies n/v/d

## 2018-12-26 NOTE — ED Notes (Signed)
Mr. Bungert- son- [phone]

## 2018-12-26 NOTE — ED Triage Notes (Signed)
Pt ate some apple sauce and drank water. Pt also provided with warm blanket and hand warmers.  Daughter call and wants PT DC  Home.

## 2018-12-26 NOTE — ED Provider Notes (Signed)
[facility] EMERGENCY DEPARTMENT Provider Note   CSN: [id number] Arrival date & time: 12/26/18  1247     History   Chief Complaint Chief Complaint  Patient presents with  . Weakness    HPI Shannon Ortiz is a 83 y.o. female.     Pt c/o feeling generally weak in past 3-4 days. Symptoms gradual onset, constant, persistent. States feels a bit better, normal this afternoon. Denies any unilateral numbness or weakness. No change in speech or vision. No change in walking, gait or normal functional ability. Denies any pain. No headaches. No neck or back pain. No chest pain or discomfort. No sob or unusual doe. No abd pain. Normal appetite. No nvd. No fever or chills. No gu c/o. No faintness or dizziness. No recent change in meds or new meds. No recent bleeding, rectal bleeding or melena. mp cough or uri symptoms. No known covid exposure. Walks w cane at baseline.   The history is provided by the patient.  Weakness Associated symptoms: no abdominal pain, no chest pain, no cough, no diarrhea, no dysuria, no fever, no headaches, no shortness of breath and no vomiting     Past Medical History:  Diagnosis Date  . Acquired diverticulum of distal esophagus with dysphagia 10/03/2018  . AKI (acute kidney injury) ([place]) 08/31/2018  . Anxiety   . Bradycardia 02/16/2014  . Chest tightness   . DDD (degenerative disc disease), lumbar   . DJD (degenerative joint disease)   . Dry eyes   . Esophageal stenosis 08/31/2018  . Facial pain   . Fibromyalgia   . Hemorrhoid   . Hypertension   . Hypokalemia 08/31/2018  . Hypothyroidism   . Lightheadedness   . Near syncope 02/16/2014  . Osteopenia   . Sigmoid diverticulosis   . Thyroid nodule    PT DENIES    Patient Active Problem List   Diagnosis Date Noted  . Hypomagnesemia 10/28/2018  . Hiatal hernia s/p robotic repair 10/25/2018 10/25/2018  . Status post robotic [place] (posterior partial) fundoplication 10/31/8414 60/63/0160  .  Esophageal stricture 10/15/2018  . Acquired diverticulum of distal esophagus s/p robotic excision & myotomy 10/25/2018 10/03/2018  . Esophageal stenosis 08/31/2018  . Hypokalemia 08/31/2018  . AKI (acute kidney injury) ([place]) 08/31/2018  . Bradycardia 02/16/2014  . Near syncope 02/16/2014  . HTN (hypertension) 02/16/2014    Past Surgical History:  Procedure Laterality Date  . ABDOMINAL HYSTERECTOMY     COMPLETE  . BALLOON DILATION N/A 09/01/2018   Procedure: BALLOON DILATION;  Surgeon: Ronnette Juniper, MD;  Location: [facility];  Service: Gastroenterology;  Laterality: N/A;  . BIOPSY  09/01/2018   Procedure: BIOPSY;  Surgeon: Ronnette Juniper, MD;  Location: [facility] ENDOSCOPY;  Service: Gastroenterology;;  . Lum Keas INJECTION  09/01/2018   Procedure: BOTOX INJECTION;  Surgeon: Ronnette Juniper, MD;  Location: [facility];  Service: Gastroenterology;;  . BREAST SURGERY     breast biopsy  . CHOLECYSTECTOMY    . ESOPHAGEAL MANOMETRY N/A 10/07/2018   Procedure: ESOPHAGEAL MANOMETRY (EM);  Surgeon: Ronnette Juniper, MD;  Location: [facility] ENDOSCOPY;  Service: Gastroenterology;  Laterality: N/A;  . ESOPHAGOGASTRODUODENOSCOPY N/A 10/25/2018   Procedure: ESOPHAGOGASTRODUODENOSCOPY (EGD);  Surgeon: Michael Boston, MD;  Location: [facility] ORS;  Service: General;  Laterality: N/A;  . ESOPHAGOGASTRODUODENOSCOPY (EGD) WITH PROPOFOL N/A 09/01/2018   Procedure: ESOPHAGOGASTRODUODENOSCOPY (EGD) WITH PROPOFOL;  Surgeon: Ronnette Juniper, MD;  Location: [place];  Service: Gastroenterology;  Laterality: N/A;     OB History   No obstetric  history on file.      Home Medications    Prior to Admission medications   Medication Sig Start Date End Date Taking? Authorizing Provider  amLODipine (NORVASC) 5 MG tablet Take 5 mg by mouth daily.    [provider]  feeding supplement (ENSURE SURGERY) LIQD Take 237 mLs by mouth 2 (two) times daily between meals. 10/17/18   Mercy Riding, MD  Multiple Vitamins-Minerals (MULTIVITAMIN ADULT)  CHEW Chew 2 each by mouth daily.    [provider]  ondansetron (ZOFRAN) 4 MG tablet Take 1 tablet (4 mg total) by mouth every 8 (eight) hours as needed for nausea. 10/25/18   Michael Boston, MD  pantoprazole (PROTONIX) 40 MG tablet Take 40 mg by mouth daily.    [provider]  polyethylene glycol (MIRALAX / GLYCOLAX) packet Take 17 g by mouth daily. 09/02/18   Kayleen Memos, DO  senna-docusate (SENOKOT-S) 8.6-50 MG tablet Take 2 tablets by mouth at bedtime as needed for mild constipation. 09/02/18   Kayleen Memos, DO  traMADol (ULTRAM) 50 MG tablet Take 1-2 tablets (50-100 mg total) by mouth every 6 (six) hours as needed for moderate pain or severe pain. 10/25/18   Michael Boston, MD    Family History Family History  Problem Relation Age of Onset  . Hypertension Mother   . Diabetes Mother        non-insulin dependant  . CAD Brother     Social History Social History   Tobacco Use  . Smoking status: Never Smoker  . Smokeless tobacco: Never Used  Substance Use Topics  . Alcohol use: No  . Drug use: No     Allergies   Elavil [amitriptyline hcl] and Ketek [telithromycin]   Review of Systems Review of Systems  Constitutional: Negative for chills and fever.  HENT: Negative for sore throat.   Eyes: Negative for pain and visual disturbance.  Respiratory: Negative for cough and shortness of breath.   Cardiovascular: Negative for chest pain, palpitations and leg swelling.  Gastrointestinal: Negative for abdominal pain, blood in stool, diarrhea and vomiting.  Endocrine: Negative for polyuria.  Genitourinary: Negative for dysuria and flank pain.  Musculoskeletal: Negative for back pain and neck pain.  Skin: Negative for rash.  Neurological: Positive for weakness. Negative for speech difficulty, numbness and headaches.  Hematological: Does not bruise/bleed easily.  Psychiatric/Behavioral: Negative for confusion.     Physical Exam Updated Vital Signs BP 139/76    Pulse (!) 106   Temp 98.9 F (37.2 C) (Oral)   Resp 16   Ht 1.702 m (5\' 7" )   Wt 68.9 kg   SpO2 100%   BMI 23.81 kg/m   Physical Exam Vitals signs and nursing note reviewed.  Constitutional:      Appearance: Normal appearance. She is well-developed.  HENT:     Head: Atraumatic.     Nose: Nose normal.     Mouth/Throat:     Mouth: Mucous membranes are moist.  Eyes:     General: No scleral icterus.    Conjunctiva/sclera: Conjunctivae normal.     Pupils: Pupils are equal, round, and reactive to light.  Neck:     Musculoskeletal: Normal range of motion and neck supple. No neck rigidity or muscular tenderness.     Trachea: No tracheal deviation.     Comments: No bruits. Thyroid not tender, not enlarged.  Cardiovascular:     Rate and Rhythm: Normal rate and regular rhythm.     Pulses:  Normal pulses.     Heart sounds: Normal heart sounds. No murmur. No friction rub. No gallop.   Pulmonary:     Effort: Pulmonary effort is normal. No respiratory distress.     Breath sounds: Normal breath sounds.  Abdominal:     General: Bowel sounds are normal. There is no distension.     Palpations: Abdomen is soft. There is no mass.     Tenderness: There is no abdominal tenderness. There is no guarding.  Genitourinary:    Comments: No cva tenderness.  Musculoskeletal:        General: No swelling or tenderness.  Skin:    General: Skin is warm and dry.     Findings: No rash.  Neurological:     Mental Status: She is alert.     Comments: Alert, speech normal, fluent. No aphasia. Motor/sens grossly intact bil. Steady gait w cane.   Psychiatric:        Mood and Affect: Mood normal.      ED Treatments / Results  Labs (all labs ordered are listed, but only abnormal results are displayed) Results for orders placed or performed during the hospital encounter of 13/24/40  Basic metabolic panel  Result Value Ref Range   Sodium 136 135 - 145 mmol/L   Potassium 2.9 (L) 3.5 - 5.1 mmol/L    Chloride 100 98 - 111 mmol/L   CO2 24 22 - 32 mmol/L   Glucose, Bld 119 (H) 70 - 99 mg/dL   BUN 11 8 - 23 mg/dL   Creatinine, Ser 0.86 0.44 - 1.00 mg/dL   Calcium 10.0 8.9 - 10.3 mg/dL   GFR calc non Af Amer >60 >60 mL/min   GFR calc Af Amer >60 >60 mL/min   Anion gap 12 5 - 15  CBC  Result Value Ref Range   WBC 4.6 4.0 - 10.5 K/uL   RBC 4.04 3.87 - 5.11 MIL/uL   Hemoglobin 9.8 (L) 12.0 - 15.0 g/dL   HCT 29.5 (L) 36.0 - 46.0 %   MCV 73.0 (L) 80.0 - 100.0 fL   MCH 24.3 (L) 26.0 - 34.0 pg   MCHC 33.2 30.0 - 36.0 g/dL   RDW 18.6 (H) 11.5 - 15.5 %   Platelets 245 150 - 400 K/uL   nRBC 0.0 0.0 - 0.2 %  Urinalysis, Routine w reflex microscopic  Result Value Ref Range   Color, Urine STRAW (A) YELLOW   APPearance CLEAR CLEAR   Specific Gravity, Urine 1.009 1.005 - 1.030   pH 7.0 5.0 - 8.0   Glucose, UA NEGATIVE NEGATIVE mg/dL   Hgb urine dipstick NEGATIVE NEGATIVE   Bilirubin Urine NEGATIVE NEGATIVE   Ketones, ur 5 (A) NEGATIVE mg/dL   Protein, ur NEGATIVE NEGATIVE mg/dL   Nitrite NEGATIVE NEGATIVE   Leukocytes,Ua NEGATIVE NEGATIVE  CBG monitoring, ED  Result Value Ref Range   Glucose-Capillary 94 70 - 99 mg/dL  Troponin I (High Sensitivity)  Result Value Ref Range   Troponin I (High Sensitivity) 221 (HH) <18 ng/L  Troponin I (High Sensitivity)  Result Value Ref Range   Troponin I (High Sensitivity) 202 (HH) <18 ng/L   Dg Chest Port 1 View  Result Date: 12/26/2018 CLINICAL DATA:  Generalized weakness starting 4 days ago, some dizziness. Esophageal surgery on May 12th. EXAM: PORTABLE CHEST 1 VIEW COMPARISON:  Chest x-rays dated 10/15/2018 and 02/16/2014. FINDINGS: Stable cardiomegaly. Overall cardiomediastinal silhouette is not significantly changed lungs are clear. No pleural effusion or pneumothorax seen.  Osseous structures about the chest are unremarkable. IMPRESSION: No active disease. No evidence of pneumonia or pulmonary edema. Stable cardiomegaly. Electronically Signed    By: Franki Cabot M.D.   On: 12/26/2018 18:53    EKG EKG Interpretation  Date/Time:  Monday December 26 2018 12:59:35 EDT Ventricular Rate:  101 PR Interval:  148 QRS Duration: 118 QT Interval:  362 QTC Calculation: 469 R Axis:   -32 Text Interpretation:  Sinus rhythm Premature ventricular complexes Left axis deviation Incomplete left bundle branch block Non-specific ST-t changes Confirmed by Lajean Saver [phone]) on 12/26/2018 3:29:23 PM   Radiology Dg Chest Port 1 View  Result Date: 12/26/2018 CLINICAL DATA:  Generalized weakness starting 4 days ago, some dizziness. Esophageal surgery on May 12th. EXAM: PORTABLE CHEST 1 VIEW COMPARISON:  Chest x-rays dated 10/15/2018 and 02/16/2014. FINDINGS: Stable cardiomegaly. Overall cardiomediastinal silhouette is not significantly changed lungs are clear. No pleural effusion or pneumothorax seen. Osseous structures about the chest are unremarkable. IMPRESSION: No active disease. No evidence of pneumonia or pulmonary edema. Stable cardiomegaly. Electronically Signed   By: Franki Cabot M.D.   On: 12/26/2018 18:53    Procedures Procedures (including critical care time)  Medications Ordered in ED Medications  sodium chloride flush (NS) 0.9 % injection 3 mL (has no administration in time range)     Initial Impression / Assessment and Plan / ED Course  I have reviewed the triage vital signs and the nursing notes.  Pertinent labs & imaging results that were available during my care of the patient were reviewed by me and considered in my medical decision making (see chart for details).  Labs sent. Ecg.   Reviewed nursing notes and prior charts for additional history.   Labs reviewed by me - k low. kcl po. Po fluids.  cxr reviewed by me - no pna.   Labs reviewed by me, k low. kcl po. Await troponin.  Hs trop is high. Recheck pt - no chest pain or discomfort. ?whether recent weakness/fatigue could be anginal equiv.   Delta trop also elev.    Recheck pt, no cp.   Given weakness, fatigue, elevated trop -  Will admit. obs admission. hospitalist consulted for admission.    Final Clinical Impressions(s) / ED Diagnoses   Final diagnoses:  None    ED Discharge Orders    None       Lajean Saver, MD 12/26/18 2027

## 2018-12-27 ENCOUNTER — Observation Stay (HOSPITAL_BASED_OUTPATIENT_CLINIC_OR_DEPARTMENT_OTHER): Payer: Medicare Other

## 2018-12-27 ENCOUNTER — Encounter (HOSPITAL_COMMUNITY): Payer: Self-pay | Admitting: Internal Medicine

## 2018-12-27 DIAGNOSIS — I10 Essential (primary) hypertension: Secondary | ICD-10-CM | POA: Diagnosis not present

## 2018-12-27 DIAGNOSIS — I361 Nonrheumatic tricuspid (valve) insufficiency: Secondary | ICD-10-CM | POA: Diagnosis not present

## 2018-12-27 DIAGNOSIS — R531 Weakness: Secondary | ICD-10-CM | POA: Diagnosis not present

## 2018-12-27 DIAGNOSIS — E876 Hypokalemia: Secondary | ICD-10-CM

## 2018-12-27 DIAGNOSIS — R7989 Other specified abnormal findings of blood chemistry: Secondary | ICD-10-CM

## 2018-12-27 DIAGNOSIS — I340 Nonrheumatic mitral (valve) insufficiency: Secondary | ICD-10-CM | POA: Diagnosis not present

## 2018-12-27 LAB — HEPATIC FUNCTION PANEL
ALT: 7 U/L (ref 0–44)
AST: 16 U/L (ref 15–41)
Albumin: 3.2 g/dL — ABNORMAL LOW (ref 3.5–5.0)
Alkaline Phosphatase: 32 U/L — ABNORMAL LOW (ref 38–126)
Bilirubin, Direct: 0.2 mg/dL (ref 0.0–0.2)
Indirect Bilirubin: 0.7 mg/dL (ref 0.3–0.9)
Total Bilirubin: 0.9 mg/dL (ref 0.3–1.2)
Total Protein: 5.6 g/dL — ABNORMAL LOW (ref 6.5–8.1)

## 2018-12-27 LAB — RETICULOCYTES
Immature Retic Fract: 13.8 % (ref 2.3–15.9)
RBC.: 3.61 MIL/uL — ABNORMAL LOW (ref 3.87–5.11)
Retic Count, Absolute: 46.6 10*3/uL (ref 19.0–186.0)
Retic Ct Pct: 1.3 % (ref 0.4–3.1)

## 2018-12-27 LAB — TROPONIN I (HIGH SENSITIVITY)
Troponin I (High Sensitivity): 213 ng/L (ref <18)
Troponin I (High Sensitivity): 214 ng/L (ref <18)

## 2018-12-27 LAB — CBC WITH DIFFERENTIAL/PLATELET
Abs Immature Granulocytes: 0.01 10*3/uL (ref 0.00–0.07)
Basophils Absolute: 0 10*3/uL (ref 0.0–0.1)
Basophils Relative: 1 %
Eosinophils Absolute: 0 10*3/uL (ref 0.0–0.5)
Eosinophils Relative: 1 %
HCT: 25.6 % — ABNORMAL LOW (ref 36.0–46.0)
Hemoglobin: 8.7 g/dL — ABNORMAL LOW (ref 12.0–15.0)
Immature Granulocytes: 0 %
Lymphocytes Relative: 39 %
Lymphs Abs: 1.4 10*3/uL (ref 0.7–4.0)
MCH: 24.1 pg — ABNORMAL LOW (ref 26.0–34.0)
MCHC: 34 g/dL (ref 30.0–36.0)
MCV: 70.9 fL — ABNORMAL LOW (ref 80.0–100.0)
Monocytes Absolute: 0.6 10*3/uL (ref 0.1–1.0)
Monocytes Relative: 16 %
Neutro Abs: 1.5 10*3/uL — ABNORMAL LOW (ref 1.7–7.7)
Neutrophils Relative %: 43 %
Platelets: 203 10*3/uL (ref 150–400)
RBC: 3.61 MIL/uL — ABNORMAL LOW (ref 3.87–5.11)
RDW: 18.1 % — ABNORMAL HIGH (ref 11.5–15.5)
WBC: 3.6 10*3/uL — ABNORMAL LOW (ref 4.0–10.5)
nRBC: 0 % (ref 0.0–0.2)

## 2018-12-27 LAB — IRON AND TIBC
Iron: 52 ug/dL (ref 28–170)
Saturation Ratios: 27 % (ref 10.4–31.8)
TIBC: 195 ug/dL — ABNORMAL LOW (ref 250–450)
UIBC: 143 ug/dL

## 2018-12-27 LAB — BASIC METABOLIC PANEL
Anion gap: 8 (ref 5–15)
BUN: 6 mg/dL — ABNORMAL LOW (ref 8–23)
CO2: 24 mmol/L (ref 22–32)
Calcium: 9.7 mg/dL (ref 8.9–10.3)
Chloride: 103 mmol/L (ref 98–111)
Creatinine, Ser: 0.68 mg/dL (ref 0.44–1.00)
GFR calc Af Amer: >60 mL/min (ref >60)
GFR calc non Af Amer: >60 mL/min (ref >60)
Glucose, Bld: 90 mg/dL (ref 70–99)
Potassium: 3.3 mmol/L — ABNORMAL LOW (ref 3.5–5.1)
Sodium: 135 mmol/L (ref 135–145)

## 2018-12-27 LAB — ECHOCARDIOGRAM COMPLETE
Height: 67 in
Weight: 2312 oz

## 2018-12-27 LAB — VITAMIN B12: Vitamin B-12: 529 pg/mL (ref 180–914)

## 2018-12-27 LAB — FERRITIN: Ferritin: 521 ng/mL — ABNORMAL HIGH (ref 11–307)

## 2018-12-27 LAB — MAGNESIUM: Magnesium: 1.8 mg/dL (ref 1.7–2.4)

## 2018-12-27 LAB — FOLATE: Folate: 13.2 ng/mL (ref >5.9)

## 2018-12-27 LAB — TSH: TSH: 0.821 u[IU]/mL (ref 0.350–4.500)

## 2018-12-27 LAB — CK: Total CK: 23 U/L — ABNORMAL LOW (ref 38–234)

## 2018-12-27 MED ORDER — POTASSIUM CHLORIDE CRYS ER 20 MEQ PO TBCR
40.0000 meq | EXTENDED_RELEASE_TABLET | Freq: Once | ORAL | Status: AC
  Administered 2018-12-27: 40 meq via ORAL
  Filled 2018-12-27: qty 2

## 2018-12-27 MED ORDER — LOSARTAN POTASSIUM 50 MG PO TABS: 50.0000 mg | ORAL_TABLET | Freq: Every day | ORAL | 0 refills | Status: DC

## 2018-12-27 MED ORDER — ACETAMINOPHEN 325 MG PO TABS: 650.0000 mg | ORAL_TABLET | Freq: Four times a day (QID) | ORAL | Status: DC | PRN

## 2018-12-27 MED ORDER — ASPIRIN 81 MG PO TBEC: 81.0000 mg | DELAYED_RELEASE_TABLET | Freq: Every day | ORAL | Status: DC

## 2018-12-27 MED ORDER — ASPIRIN EC 81 MG PO TBEC
81.0000 mg | DELAYED_RELEASE_TABLET | Freq: Every day | ORAL | Status: DC
  Administered 2018-12-27: 09:00:00 81 mg via ORAL
  Filled 2018-12-27: qty 1

## 2018-12-27 MED ORDER — ACETAMINOPHEN 650 MG RE SUPP: 650.0000 mg | Freq: Four times a day (QID) | RECTAL | Status: DC | PRN

## 2018-12-27 MED ORDER — PANTOPRAZOLE SODIUM 40 MG PO TBEC
40.0000 mg | DELAYED_RELEASE_TABLET | ORAL | Status: DC
  Administered 2018-12-27: 40 mg via ORAL
  Filled 2018-12-27: qty 1

## 2018-12-27 MED ORDER — ONDANSETRON HCL 4 MG/2ML IJ SOLN: 4.0000 mg | Freq: Four times a day (QID) | INTRAMUSCULAR | Status: DC | PRN

## 2018-12-27 MED ORDER — ENOXAPARIN SODIUM 40 MG/0.4ML ~~LOC~~ SOLN
40.0000 mg | SUBCUTANEOUS | Status: DC
  Administered 2018-12-27: 40 mg via SUBCUTANEOUS
  Filled 2018-12-27: qty 0.4

## 2018-12-27 MED ORDER — POTASSIUM CHLORIDE CRYS ER 20 MEQ PO TBCR
20.0000 meq | EXTENDED_RELEASE_TABLET | Freq: Once | ORAL | Status: AC
  Administered 2018-12-27: 20 meq via ORAL
  Filled 2018-12-27: qty 1

## 2018-12-27 MED ORDER — AMLODIPINE BESYLATE 5 MG PO TABS
5.0000 mg | ORAL_TABLET | Freq: Every day | ORAL | Status: DC
  Administered 2018-12-27: 5 mg via ORAL
  Filled 2018-12-27: qty 1

## 2018-12-27 MED ORDER — LOSARTAN POTASSIUM 50 MG PO TABS
50.0000 mg | ORAL_TABLET | Freq: Every day | ORAL | Status: DC
  Administered 2018-12-27: 50 mg via ORAL
  Filled 2018-12-27: qty 1

## 2018-12-27 MED ORDER — ONDANSETRON HCL 4 MG PO TABS: 4.0000 mg | ORAL_TABLET | Freq: Four times a day (QID) | ORAL | Status: DC | PRN

## 2018-12-27 MED ORDER — ENSURE SURGERY PO LIQD
237.0000 mL | Freq: Every day | ORAL | Status: DC
  Filled 2018-12-27: qty 237

## 2018-12-27 MED ORDER — ENSURE ENLIVE PO LIQD
237.0000 mL | Freq: Two times a day (BID) | ORAL | Status: DC
  Administered 2018-12-27: 237 mL via ORAL

## 2018-12-27 NOTE — Discharge Summary (Signed)
Physician Discharge Summary  Shannon Ortiz XNA:[id number] DOB: 09/12/1935 DOA: 12/26/2018  PCP: Lawerance Cruel, MD  Admit date: 12/26/2018 Discharge date: 12/27/2018  Recommendations for Outpatient Follow-up:   Generalized weakness with reported weight loss of unclear etiology --Patient is is trying to eat especially can but struggles to drink as much Ensure as she has been instructed.  She has lost some weight.  Needs close outpatient follow-up.   --Consider f/u with GI Dr. Therisa Doyne as an outpatient  Elevated troponin --Follow-up with Dr. Johnsie Cancel an outpatient for consideration of a functional study.  Hypokalemia, likely secondary to hydrochlorothiazide --Consider repeat BMP as an outpatient off of hydrochlorothiazide.  If potassium is borderline or low, consider daily potassium supplementation   Chronic microcytic anemia --Consider outpatient GI evaluation.    Follow-up Information    Lawerance Cruel, MD. Schedule an appointment as soon as possible for a visit in 1 week(s).   Specialty: Family Medicine Contact information: [address] [place] [address] [phone]        Josue Hector, MD. Schedule an appointment as soon as possible for a visit in 2 week(s).   Specialty: Cardiology Contact information: [address] [address] [phone]            Discharge Diagnoses: Principal diagnosis is #1 1. Elevated troponin 2. Generalized weakness with reported weight loss of unclear etiology 3. Hypokalemia, likely secondary to hydrochlorothiazide 4. Chronic microcytic anemia 5. Essential hypertension   Discharge Condition: improved Disposition: home  Diet recommendation: heart healthy  Filed Weights   12/26/18 1302 12/26/18 2233 12/27/18 0353  Weight: 68.9 kg 66 kg 65.5 kg    History of present illness:  83 year old woman PMH esophageal strictures requiring dilatation, underwent recent distal esophageal  diverticular resection, hiatal hernia repair and fundoplication may 2020 presented with 3 to 4-day history of generalized weakness without vomiting diarrhea or shortness of breath.  No focal deficit.  Initial evaluation revealed elevated troponin and she was admitted for further evaluation of elevated troponin, possible anginal equivalent, generalized weakness, repletion of hypokalemia.  Hospital Course:  Patient was observed overnight, treated with supportive care, potassium, HCTZ was discontinued.  She was seen by cardiology who felt that her elevated troponin may have been secondary to hypokalemia and did not suggest ACS.  Her echocardiogram was reassuring and cardiology felt that she could follow-up with Dr. Johnsie Cancel an outpatient for consideration of a functional study.  Today the patient feels much better is ambulating in the room without difficulty and feels like her weakness is largely resolved.  Do not see any further issues to work-up as an outpatient.  Generalized weakness with reported weight loss of unclear etiology --Patient is is trying to eat especially can but struggles to drink as much Ensure as she has been instructed.  She has lost some weight.  Needs close outpatient follow-up.   --Consider f/u with GI Dr. Therisa Doyne as an outpatient  Elevated troponin --Mildly elevated, flat trend --2D echocardiogram unremarkable, cardiology plans no further testing --Follow-up with Dr. Johnsie Cancel an outpatient for consideration of a functional study.  Hypokalemia, likely secondary to hydrochlorothiazide --Repleted --Consider repeat BMP as an outpatient off of hydrochlorothiazide.  If potassium is borderline or low, consider daily potassium supplementation   Chronic microcytic anemia --Consider outpatient GI evaluation.   Essential hypertension on amlodipine and ARB --HCTZ and ARB were discontinued in May in the setting of dehydration and AKI but appear to have been restarted in  the outpatient  setting. --No evidence of dehydration here.  Continue ARB but stop hydrochlorothiazide on discharge.  Continue amlodipine.  Hypothyroidism? --Not on any medication. TSH was within normal limits.  Consultants   Cardiology   Procedures   Echo IMPRESSIONS   1. The left ventricle has normal systolic function, with an ejection fraction of 55-60%. The cavity size was normal. There is mildly increased left ventricular wall thickness. Left ventricular diastolic Doppler parameters are consistent with impaired  relaxation. No evidence of left ventricular regional wall motion abnormalities. 2. The right ventricle has normal systolic function. The cavity was normal. There is no increase in right ventricular wall thickness. 3. Left atrial size was moderately dilated. 4. The pericardial effusion is circumferential. 5. Trivial pericardial effusion is present. 6. Mild mitral valve prolapse. 7. The mitral valve is abnormal. Mild thickening of the mitral valve leaflet. 8. The tricuspid valve is grossly normal. 9. The aortic valve is tricuspid. Mild sclerosis of the aortic valve. No stenosis of the aortic valve.  Today's assessment: S: Feels much better today.  Generalized weakness seems to have resolved.  She thinks it was from her potassium level being low.  Tolerating diet.  No chest pain or shortness of breath. O: Vitals:  Vitals:   12/27/18 1408 12/27/18 1410  BP: (!) 99/56 105/63  Pulse: 61   Resp: 18   Temp: 98.2 F (36.8 C)   SpO2: 100%     Constitutional:  . Appears calm and comfortable.  Well-appearing. ENMT:  . grossly normal hearing  . Lips appear normal Respiratory:  . CTA bilaterally, no w/r/r.  . Respiratory effort normal.  Cardiovascular:  . RRR, no m/r/g . Telemetry sinus rhythm with PVCs . No LE extremity edema   Musculoskeletal:  . RUE, LUE, RLE, LLE   . Tone and strength appear grossly normal . Patient stands without assistance, balance appears  intact and she ambulates without difficulty in the room and without assistance. Psychiatric:  . judgement and insight appear normal . Mental status o Mood, affect appropriate  Today's Data   Potassium 3.3, remainder BMP unremarkable.  Magnesium within normal limits.  LFTs unremarkable  Troponins flat, reviewed  B12 level within normal limits  Hemoglobin stable 8.7.  TSH within normal limits 22 1  Lab Data   Urinalysis was negative  SARS-CoV-2 negative  Micro Data     Imaging   Chest x-ray independently reviewed, no acute disease  Cardiology Data   EKG shows sinus tachycardia with incomplete left bundle branch block, no acute changes,    Discharge Instructions  Discharge Instructions    Diet - low sodium heart healthy   Complete by: As directed    Discharge instructions   Complete by: As directed    Call your physician or seek immediate medical attention for chest pain, shortness of breath, weakness or worsening of condition.   Increase activity slowly   Complete by: As directed      Allergies as of 12/27/2018      Reactions   Elavil [amitriptyline Hcl] Hives   Ketek [telithromycin] Hives      Medication List    STOP taking these medications   cephALEXin 500 MG capsule Commonly known as: KEFLEX   losartan-hydrochlorothiazide 100-25 MG tablet Commonly known as: HYZAAR   polyethylene glycol 17 g packet Commonly known as: MIRALAX / GLYCOLAX   senna-docusate 8.6-50 MG tablet Commonly known as: Senokot-S   traMADol 50 MG tablet Commonly known as: Veatrice Bourbon  TAKE these medications   amLODipine 5 MG tablet Commonly known as: NORVASC Take 5 mg by mouth daily.   aspirin 81 MG EC tablet Take 1 tablet (81 mg total) by mouth daily. Start taking on: December 28, 2018   feeding supplement Liqd Take 237 mLs by mouth 2 (two) times daily between meals. What changed: when to take this   losartan 50 MG tablet Commonly known as: COZAAR Take 1 tablet  (50 mg total) by mouth daily. Start taking on: December 28, 2018   Multivitamin Adult Diona Fanti 1 tablet by mouth daily.   ondansetron 4 MG tablet Commonly known as: ZOFRAN Take 1 tablet (4 mg total) by mouth every 8 (eight) hours as needed for nausea.   pantoprazole 40 MG tablet Commonly known as: PROTONIX Take 40 mg by mouth every other day.      Allergies  Allergen Reactions  . Elavil [Amitriptyline Hcl] Hives  . Ketek [Telithromycin] Hives    The results of significant diagnostics from this hospitalization (including imaging, microbiology, ancillary and laboratory) are listed below for reference.    Significant Diagnostic Studies: Dg Chest Port 1 View  Result Date: 12/26/2018 CLINICAL DATA:  Generalized weakness starting 4 days ago, some dizziness. Esophageal surgery on May 12th. EXAM: PORTABLE CHEST 1 VIEW COMPARISON:  Chest x-rays dated 10/15/2018 and 02/16/2014. FINDINGS: Stable cardiomegaly. Overall cardiomediastinal silhouette is not significantly changed lungs are clear. No pleural effusion or pneumothorax seen. Osseous structures about the chest are unremarkable. IMPRESSION: No active disease. No evidence of pneumonia or pulmonary edema. Stable cardiomegaly. Electronically Signed   By: Franki Cabot M.D.   On: 12/26/2018 18:53    Microbiology: Recent Results (from the past 240 hour(s))  SARS Coronavirus 2 (CEPHEID - Performed in [facility] hospital lab), Hosp Order     Status: None   Collection Time: 12/26/18  6:46 PM   Specimen: Nasopharyngeal Swab  Result Value Ref Range Status   SARS Coronavirus 2 NEGATIVE NEGATIVE Final    Comment: (NOTE) If result is NEGATIVE SARS-CoV-2 target nucleic acids are NOT DETECTED. The SARS-CoV-2 RNA is generally detectable in upper and lower  respiratory specimens during the acute phase of infection. The lowest  concentration of SARS-CoV-2 viral copies this assay can detect is 250  copies / mL. A negative result does not preclude  SARS-CoV-2 infection  and should not be used as the sole basis for treatment or other  patient management decisions.  A negative result may occur with  improper specimen collection / handling, submission of specimen other  than nasopharyngeal swab, presence of viral mutation(s) within the  areas targeted by this assay, and inadequate number of viral copies  (<250 copies / mL). A negative result must be combined with clinical  observations, patient history, and epidemiological information. If result is POSITIVE SARS-CoV-2 target nucleic acids are DETECTED. The SARS-CoV-2 RNA is generally detectable in upper and lower  respiratory specimens dur ing the acute phase of infection.  Positive  results are indicative of active infection with SARS-CoV-2.  Clinical  correlation with patient history and other diagnostic information is  necessary to determine patient infection status.  Positive results do  not rule out bacterial infection or co-infection with other viruses. If result is PRESUMPTIVE POSTIVE SARS-CoV-2 nucleic acids MAY BE PRESENT.   A presumptive positive result was obtained on the submitted specimen  and confirmed on repeat testing.  While 2019 novel coronavirus  (SARS-CoV-2) nucleic acids may be present in the submitted sample  additional confirmatory testing may be necessary for epidemiological  and / or clinical management purposes  to differentiate between  SARS-CoV-2 and other Sarbecovirus currently known to infect humans.  If clinically indicated additional testing with an alternate test  methodology [phone]) is advised. The SARS-CoV-2 RNA is generally  detectable in upper and lower respiratory sp ecimens during the acute  phase of infection. The expected result is Negative. Fact Sheet for Patients:  StrictlyIdeas.no Fact Sheet for Healthcare Providers: BankingDealers.co.za This test is not yet approved or cleared by the  [place] FDA and has been authorized for detection and/or diagnosis of SARS-CoV-2 by FDA under an Emergency Use Authorization (EUA).  This EUA will remain in effect (meaning this test can be used) for the duration of the COVID-19 declaration under Section 564(b)(1) of the Act, 21 U.S.C. section 360bbb-3(b)(1), unless the authorization is terminated or revoked sooner. Performed at [facility], [place] [address], [place], [place] [address]      Labs: Basic Metabolic Panel: Recent Labs  Lab 12/26/18 1305 12/27/18 0336  NA 136 135  K 2.9* 3.3*  CL 100 103  CO2 24 24  GLUCOSE 119* 90  BUN 11 6*  CREATININE 0.86 0.68  CALCIUM 10.0 9.7  MG  --  1.8   Liver Function Tests: Recent Labs  Lab 12/27/18 0336  AST 16  ALT 7  ALKPHOS 32*  BILITOT 0.9  PROT 5.6*  ALBUMIN 3.2*   CBC: Recent Labs  Lab 12/26/18 1305 12/27/18 0336  WBC 4.6 3.6*  NEUTROABS  --  1.5*  HGB 9.8* 8.7*  HCT 29.5* 25.6*  MCV 73.0* 70.9*  PLT 245 203   Cardiac Enzymes: Recent Labs  Lab 12/27/18 0336  CKTOTAL 23*   CBG: Recent Labs  Lab 12/26/18 1611  GLUCAP 94    Principal Problem:   Weakness generalized Active Problems:   HTN (hypertension)   Hypokalemia   Elevated troponin   Time coordinating discharge: 35 minutes  Signed:  Murray Hodgkins, MD  [facility]  12/27/2018, 4:20 PM

## 2018-12-27 NOTE — Consult Note (Addendum)
Cardiology Consultation:   Patient ID: Appolonia Ackert MRN: [id number]; DOB: 04/06/1936  Admit date: 12/26/2018 Date of Consult: 12/27/2018  Primary Care Provider: Lawerance Cruel, MD Primary Cardiologist: Jenkins Rouge, MD  Primary Electrophysiologist:  None    Patient Profile:   Minami Arriaga is a 83 y.o. female with a hx of Hypertension, CKD stage 3, hypothyroidism, esophageal stenosis, s/p esophageal diverticulectomy and hiatal hernia repair 10/25/18 who is being seen today for the evaluation of elevated troponin at the request of Dr. Sarajane Jews.  History of Present Illness:   Ms. Hanssen has a medical history as above. She has not prior cardiac history. She was seen in April by Dr. Johnsie Cancel for clearance to have general anesthesia and surgery. She had been having chest pain and dysphagia. She was refractory to prior interventions.  Her chest pain was felt to be atypical and no testing recommended. On 10/25/2018 she had esophageal epiphrenic diverticulectomy, fundoplication and hiatal hernia repair.   She was admitted to the hospital 10/15/18-10/17/18 with c/o dizziness and weakness. She was found to be dehydrated with AKI. Her HCTZ and ARB were held. She improved with IV fluids and was discharged on amlodipine.   Ms. Blow presented to the hospital yesterday with c/o generalized weakness over the prior 3-4 days. She lives with her daughter and granddaughter. She does some very light housework and walks to her mailbox everyday with exertional symptoms. She denies any chest pain/pressure/tighttness or shortness of breath. She denies orthopnea, PND or edema. She has occ lightheadedness mostly when she first gets up in the morning.  She is sometimes awakened with a fast pounding heart beat. She says that she has done very well after her esophageal surgery and is eating better.   On presentation her kidney function was normal with creatinine 0.86 however potassium was low at 2.9. She is anemic with Hgb  9.8 which is in her range 9-11 since March. Wbcs are normal at 4.6.   Troponins are mildly elevated in flat trend, 202-214, Chest xray shows no evidence of pneumonia or pulmonary edema. COVID 19 test negative.   Heart Pathway Score:     Past Medical History:  Diagnosis Date  . Acquired diverticulum of distal esophagus with dysphagia 10/03/2018  . AKI (acute kidney injury) ([place]) 08/31/2018  . Anxiety   . Bradycardia 02/16/2014  . Chest tightness   . DDD (degenerative disc disease), lumbar   . DJD (degenerative joint disease)   . Dry eyes   . Esophageal stenosis 08/31/2018  . Facial pain   . Fibromyalgia   . Hemorrhoid   . Hypertension   . Hypokalemia 08/31/2018  . Hypothyroidism   . Lightheadedness   . Near syncope 02/16/2014  . Osteopenia   . Sigmoid diverticulosis   . Thyroid nodule    PT DENIES    Past Surgical History:  Procedure Laterality Date  . ABDOMINAL HYSTERECTOMY     COMPLETE  . BALLOON DILATION N/A 09/01/2018   Procedure: BALLOON DILATION;  Surgeon: Ronnette Juniper, MD;  Location: [facility];  Service: Gastroenterology;  Laterality: N/A;  . BIOPSY  09/01/2018   Procedure: BIOPSY;  Surgeon: Ronnette Juniper, MD;  Location: [facility] ENDOSCOPY;  Service: Gastroenterology;;  . Lum Keas INJECTION  09/01/2018   Procedure: BOTOX INJECTION;  Surgeon: Ronnette Juniper, MD;  Location: [facility];  Service: Gastroenterology;;  . BREAST SURGERY     breast biopsy  . CHOLECYSTECTOMY    . ESOPHAGEAL MANOMETRY N/A 10/07/2018   Procedure: ESOPHAGEAL MANOMETRY (EM);  Surgeon: Ronnette Juniper, MD;  Location: [facility] ENDOSCOPY;  Service: Gastroenterology;  Laterality: N/A;  . ESOPHAGOGASTRODUODENOSCOPY N/A 10/25/2018   Procedure: ESOPHAGOGASTRODUODENOSCOPY (EGD);  Surgeon: Michael Boston, MD;  Location: [facility];  Service: General;  Laterality: N/A;  . ESOPHAGOGASTRODUODENOSCOPY (EGD) WITH PROPOFOL N/A 09/01/2018   Procedure: ESOPHAGOGASTRODUODENOSCOPY (EGD) WITH PROPOFOL;  Surgeon: Ronnette Juniper, MD;  Location: [place];  Service: Gastroenterology;  Laterality: N/A;     Home Medications:  Prior to Admission medications   Medication Sig Start Date End Date Taking? Authorizing Provider  amLODipine (NORVASC) 5 MG tablet Take 5 mg by mouth daily.   Yes [provider]  feeding supplement (ENSURE SURGERY) LIQD Take 237 mLs by mouth 2 (two) times daily between meals. Patient taking differently: Take 237 mLs by mouth daily.  10/17/18  Yes Mercy Riding, MD  losartan-hydrochlorothiazide (HYZAAR) 100-25 MG tablet Take 0.5 tablets by mouth daily.   Yes [provider]  Multiple Vitamins-Minerals (MULTIVITAMIN ADULT) CHEW Chew 1 tablet by mouth daily.    Yes [provider]  pantoprazole (PROTONIX) 40 MG tablet Take 40 mg by mouth every other day.    Yes [provider]  cephALEXin (KEFLEX) 500 MG capsule Take 500 mg by mouth every 8 (eight) hours. 12/15/18   [provider]  ondansetron (ZOFRAN) 4 MG tablet Take 1 tablet (4 mg total) by mouth every 8 (eight) hours as needed for nausea. Patient not taking: Reported on 12/26/2018 10/25/18   Michael Boston, MD  polyethylene glycol [facility] / Floria Raveling) packet Take 17 g by mouth daily. Patient not taking: Reported on 12/26/2018 09/02/18   Kayleen Memos, DO  senna-docusate (SENOKOT-S) 8.6-50 MG tablet Take 2 tablets by mouth at bedtime as needed for mild constipation. Patient not taking: Reported on 12/26/2018 09/02/18   Kayleen Memos, DO  traMADol (ULTRAM) 50 MG tablet Take 1-2 tablets (50-100 mg total) by mouth every 6 (six) hours as needed for moderate pain or severe pain. Patient not taking: Reported on 12/26/2018 10/25/18   Michael Boston, MD    Inpatient Medications: Scheduled Meds: . amLODipine  5 mg Oral Daily  . aspirin EC  81 mg Oral Daily  . enoxaparin (LOVENOX) injection  40 mg Subcutaneous Q24H  . feeding supplement  237 mL Oral Daily  . losartan  50 mg Oral Daily  . pantoprazole  40 mg Oral QODAY  . sodium  chloride flush  3 mL Intravenous Once   Continuous Infusions:  PRN Meds: acetaminophen **OR** acetaminophen, ondansetron **OR** ondansetron (ZOFRAN) IV  Allergies:    Allergies  Allergen Reactions  . Elavil [Amitriptyline Hcl] Hives  . Ketek [Telithromycin] Hives    Social History:   Social History   Socioeconomic History  . Marital status: Widowed    Spouse name: Not on file  . Number of children: Not on file  . Years of education: Not on file  . Highest education level: Not on file  Occupational History  . Not on file  Social Needs  . Financial resource strain: Not on file  . Food insecurity    Worry: Not on file    Inability: Not on file  . Transportation needs    Medical: Not on file    Non-medical: Not on file  Tobacco Use  . Smoking status: Never Smoker  . Smokeless tobacco: Never Used  Substance and Sexual Activity  . Alcohol use: No  . Drug use: No  . Sexual activity: Never  Lifestyle  .  Physical activity    Days per week: Not on file    Minutes per session: Not on file  . Stress: Not on file  Relationships  . Social Herbalist on phone: Not on file    Gets together: Not on file    Attends religious service: Not on file    Active member of club or organization: Not on file    Attends meetings of clubs or organizations: Not on file    Relationship status: Not on file  . Intimate partner violence    Fear of current or ex partner: Not on file    Emotionally abused: Not on file    Physically abused: Not on file    Forced sexual activity: Not on file  Other Topics Concern  . Not on file  Social History Narrative  . Not on file    Family History:    Family History  Problem Relation Age of Onset  . Hypertension Mother   . Diabetes Mother        non-insulin dependant  . CAD Brother      ROS:  Please see the history of present illness.   All other ROS reviewed and negative.     Physical Exam/Data:   Vitals:   12/26/18 1845  12/26/18 1900 12/26/18 2233 12/27/18 0353  BP: (!) 158/84 (!) 174/75 (!) 160/82 (!) 153/82  Pulse: 83  86 80  Resp:   18 16  Temp:   98.1 F (36.7 C) 98.3 F (36.8 C)  TempSrc:   Oral Oral  SpO2: 100%  100% 100%  Weight:   66 kg 65.5 kg  Height:   5\' 7"  (1.702 m)     Intake/Output Summary (Last 24 hours) at 12/27/2018 0736 Last data filed at 12/27/2018 0100 Gross per 24 hour  Intake 120 ml  Output 600 ml  Net -480 ml   Last 3 Weights 12/27/2018 12/26/2018 12/26/2018  Weight (lbs) 144 lb 8 oz 145 lb 8.1 oz 152 lb  Weight (kg) 65.545 kg 66 kg 68.947 kg     Body mass index is 22.63 kg/m.  General:  Well nourished, well developed, in no acute distress HEENT: normal Lymph: no adenopathy Neck: no JVD Endocrine:  No thryomegaly Vascular: No carotid bruits; FA pulses 2+ bilaterally without bruits  Cardiac:  normal S1, S2; RRR; 2/6 systolic murmur at RUSB Lungs:  clear to auscultation bilaterally, no wheezing, rhonchi or rales  Abd: soft, nontender, no hepatomegaly  Ext: no edema Musculoskeletal:  No deformities, BUE and BLE strength normal and equal Skin: warm and dry  Neuro:  CNs 2-12 intact, no focal abnormalities noted Psych:  Normal affect   EKG:  The EKG was personally reviewed and demonstrates:  Sinus tachycardia, 101 bpm, LAD, PVC, Incomplete left bundle branch block Telemetry:  Telemetry was personally reviewed and demonstrates:  Sinus rhythm in the 80's with occ PVCs  Relevant CV Studies:  None  Laboratory Data:  High Sensitivity Troponin:   Recent Labs  Lab 12/26/18 1648 12/26/18 1915 12/27/18 0336 12/27/18 0532  TROPONINIHS 221* 202* 213* 214*     Cardiac EnzymesNo results for input(s): TROPONINI in the last 168 hours. No results for input(s): TROPIPOC in the last 168 hours.  Chemistry Recent Labs  Lab 12/26/18 1305 12/27/18 0336  NA 136 135  K 2.9* 3.3*  CL 100 103  CO2 24 24  GLUCOSE 119* 90  BUN 11 6*  CREATININE 0.86 0.68  CALCIUM 10.0  9.7   GFRNONAA >60 >60  GFRAA >60 >60  ANIONGAP 12 8    Recent Labs  Lab 12/27/18 0336  PROT 5.6*  ALBUMIN 3.2*  AST 16  ALT 7  ALKPHOS 32*  BILITOT 0.9   Hematology Recent Labs  Lab 12/26/18 1305 12/27/18 0336  WBC 4.6 3.6*  RBC 4.04 3.61*  3.61*  HGB 9.8* 8.7*  HCT 29.5* 25.6*  MCV 73.0* 70.9*  MCH 24.3* 24.1*  MCHC 33.2 34.0  RDW 18.6* 18.1*  PLT 245 203   BNPNo results for input(s): BNP, PROBNP in the last 168 hours.  DDimer No results for input(s): DDIMER in the last 168 hours.   Radiology/Studies:  Dg Chest Port 1 View  Result Date: 12/26/2018 CLINICAL DATA:  Generalized weakness starting 4 days ago, some dizziness. Esophageal surgery on May 12th. EXAM: PORTABLE CHEST 1 VIEW COMPARISON:  Chest x-rays dated 10/15/2018 and 02/16/2014. FINDINGS: Stable cardiomegaly. Overall cardiomediastinal silhouette is not significantly changed lungs are clear. No pleural effusion or pneumothorax seen. Osseous structures about the chest are unremarkable. IMPRESSION: No active disease. No evidence of pneumonia or pulmonary edema. Stable cardiomegaly. Electronically Signed   By: Franki Cabot M.D.   On: 12/26/2018 18:53    Assessment and Plan:   Elevated troponin -Pt presented with weakness, no chest pain or shortness of breath.  -Troponins are mildly elevated in flat trend, 202-214, Chest xray shows no evidence of pneumonia or pulmonary edema. COVID 19 test negative. -CVD risk factor include HTN and age. No prior known CAD.  -It is unclear the reason for elevated troponin, may be related to hypokalemia, but with her age she likely has at least some degree of CAD.  -She does have a slight murmur and has had no prior echo. Will check echo for LV function, structure and wall motion. If normal, considering she is having no ischemic symptoms, would not pursue any further testing.    Hypertension -HCTZ and ARB were discontinued on May in setting of dehydration and AKI. Appears to have been  restarted at some point.  Now on amlodipine.  -Losartan ordered as inpatien, HCTZ on hold in setting of hypokalemia.  -BP is elevated 150's/80's.  -Continue to monitor and adjust meds as needed.   Hypokalemia -K+ was 2.9 on presentation, supplementation given, 3.3 this am  S/P esophageal surgery in May -Pt doing well, no dysphagia, eating much better.       For questions or updates, please contact [place] Please consult [email or web address] for contact info under     Signed, Daune Perch, NP  12/27/2018 7:36 AM As above, patient seen and examined.  Briefly she is an 83 year old female with past medical history of hypertension, hypothyroidism, esophageal stenosis, status post esophageal diverticulectomy and hiatal hernia repair for evaluation of elevated troponin.  Patient denies dyspnea on exertion, orthopnea, PND, pedal edema, palpitations, syncope or exertional chest pain.  Occasional mild dizziness in the early a.m. hours which improves throughout the day.  She complains of generalized weakness.  No fevers, chills or productive cough.  She was admitted by the hospitalist and troponin noted to be elevated.  Cardiology asked to evaluate. Electrocardiogram shows sinus rhythm with PVCs and lateral T wave inversion. Troponin-221, 202, 213, 214. Hemoglobin 8.7 with MCV 70.9.  TSH 0.821  1 elevated troponin-patient has no cardiac symptoms including no dyspnea or chest pain.  She was admitted because of weakness.  Troponin minimally elevated but no clear trend.  I do not think this  is consistent with acute coronary syndrome.  We will arrange an echocardiogram.  If LV function normal patient could be discharged from a cardiac standpoint and follow-up with Dr. Johnsie Cancel for consideration of functional study.  2 hypertension-blood pressure is minimally elevated.  Continue losartan and amlodipine.  Follow blood pressure and increase dose if needed.  I do not think she requires HCTZ at this point.   Agree with discontinuing.  3 generalized weakness-etiology unclear.  Further evaluation per primary care.  TSH is normal.  4 microcytic anemia  5 status post esophageal surgery-follow-up general surgery after discharge.  Kirk Ruths, MD   Kirk Ruths, MD

## 2018-12-27 NOTE — Progress Notes (Signed)
Initial Nutrition Assessment  INTERVENTION:   -D/c Ensure Surgery -Provide Ensure Enlive po BID, each supplement provides 350 kcal and 20 grams of protein  NUTRITION DIAGNOSIS:   Unintentional weight loss related to (recent surgery) as evidenced by percent weight loss.  GOAL:   Patient will meet greater than or equal to 90% of their needs  MONITOR:   PO intake, Supplement acceptance, Labs, Weight trends, I & O's  REASON FOR ASSESSMENT:   Consult Assessment of nutrition requirement/status  ASSESSMENT:   83 y.o. female with history of hypertension esophageal strictures requiring dilatation who had underwent recent distal esophageal diverticular resection and myotomy in May 2020 presents to the ER with increasing weakness over the last 3 to 4 days.  **RD working remotely**  Patient reports eating better since her gastric fundoplication surgery in May 2020. States she has no issues with swallowing. Pt states she eats a big breakfast and then smaller meals for the rest of the day. States she tends to only drink 1 Ensure a day at home. States they sometimes cause some diarrhea. Encouraged pt to try the Ensure we have at the hospital and if it still causes some stomach upset she can try a different supplement. RN provided [employer] this morning.   Patient endorses weight loss. Per weight records, pt has lost 25 lbs since 5/15 (14% wt loss x 2 months, significant for time frame).   Medications reviewed. Labs reviewed: Low K   NUTRITION - FOCUSED PHYSICAL EXAM:  Unable to perform -working remotely.  Diet Order:   Diet Order            Diet Heart Room service appropriate? Yes; Fluid consistency: Thin  Diet effective now              EDUCATION NEEDS:   Education needs have been addressed  Skin:  Skin Assessment: Reviewed RN Assessment  Last BM:  7/12  Height:   Ht Readings from Last 1 Encounters:  12/26/18 5\' 7"  (1.702 m)    Weight:   Wt Readings from Last  1 Encounters:  12/27/18 65.5 kg    Ideal Body Weight:  61.3 kg  BMI:  Body mass index is 22.63 kg/m.  Estimated Nutritional Needs:   Kcal:  1600-1800  Protein:  75-85g  Fluid:  1.8L/day   Clayton Bibles, MS, RD, LDN [place] Dietitian Pager: [phone] After Hours Pager: [phone]

## 2018-12-27 NOTE — Progress Notes (Signed)
  Echocardiogram 2D Echocardiogram has been performed.  Shannon Ortiz 12/27/2018, 10:14 AM

## 2018-12-27 NOTE — H&P (Signed)
History and Physical    Shannon Ortiz BVQ:[id number] DOB: 12-Mar-1936 DOA: 12/26/2018  PCP: Lawerance Cruel, MD  Patient coming from: Home.  Chief Complaint: Weakness.  HPI: Shannon Ortiz is a 83 y.o. female with history of hypertension esophageal strictures requiring dilatation who had underwent recent distal esophageal diverticular resection and myotomy in May 2020 presents to the ER with increasing weakness over the last 3 to 4 days.  Denies any nausea vomiting diarrhea chest pain shortness of breath.  Denies any focal deficit.  Patient states she has been feeling weak and has lost weight.  ED Course: In the ER on exam patient appears nonfocal.  Patient was afebrile.  Labs show potassium 2.9 troponin done was showing high sensitive troponin of 221 followed by 202.  EKG showed normal sinus rhythm PVCs.  Incomplete left bundle branch block.  Patient CBC shows hemoglobin 9.8 microcytic hypochromic picture.  Which has been chronic.  COVID-19 test was negative.  Chest x-ray unremarkable.  Patient was given potassium replacement admitted for further observation given the weakness elevated troponin and hypokalemia.  Review of Systems: As per HPI, rest all negative.   Past Medical History:  Diagnosis Date  . Acquired diverticulum of distal esophagus with dysphagia 10/03/2018  . AKI (acute kidney injury) ([place]) 08/31/2018  . Anxiety   . Bradycardia 02/16/2014  . Chest tightness   . DDD (degenerative disc disease), lumbar   . DJD (degenerative joint disease)   . Dry eyes   . Esophageal stenosis 08/31/2018  . Facial pain   . Fibromyalgia   . Hemorrhoid   . Hypertension   . Hypokalemia 08/31/2018  . Hypothyroidism   . Lightheadedness   . Near syncope 02/16/2014  . Osteopenia   . Sigmoid diverticulosis   . Thyroid nodule    PT DENIES    Past Surgical History:  Procedure Laterality Date  . ABDOMINAL HYSTERECTOMY     COMPLETE  . BALLOON DILATION N/A 09/01/2018   Procedure: BALLOON  DILATION;  Surgeon: Ronnette Juniper, MD;  Location: [facility];  Service: Gastroenterology;  Laterality: N/A;  . BIOPSY  09/01/2018   Procedure: BIOPSY;  Surgeon: Ronnette Juniper, MD;  Location: [facility] ENDOSCOPY;  Service: Gastroenterology;;  . Lum Keas INJECTION  09/01/2018   Procedure: BOTOX INJECTION;  Surgeon: Ronnette Juniper, MD;  Location: [place];  Service: Gastroenterology;;  . BREAST SURGERY     breast biopsy  . CHOLECYSTECTOMY    . ESOPHAGEAL MANOMETRY N/A 10/07/2018   Procedure: ESOPHAGEAL MANOMETRY (EM);  Surgeon: Ronnette Juniper, MD;  Location: [facility] ENDOSCOPY;  Service: Gastroenterology;  Laterality: N/A;  . ESOPHAGOGASTRODUODENOSCOPY N/A 10/25/2018   Procedure: ESOPHAGOGASTRODUODENOSCOPY (EGD);  Surgeon: Michael Boston, MD;  Location: [facility] ORS;  Service: General;  Laterality: N/A;  . ESOPHAGOGASTRODUODENOSCOPY (EGD) WITH PROPOFOL N/A 09/01/2018   Procedure: ESOPHAGOGASTRODUODENOSCOPY (EGD) WITH PROPOFOL;  Surgeon: Ronnette Juniper, MD;  Location: [place];  Service: Gastroenterology;  Laterality: N/A;     reports that she has never smoked. She has never used smokeless tobacco. She reports that she does not drink alcohol or use drugs.  Allergies  Allergen Reactions  . Elavil [Amitriptyline Hcl] Hives  . Ketek [Telithromycin] Hives    Family History  Problem Relation Age of Onset  . Hypertension Mother   . Diabetes Mother        non-insulin dependant  . CAD Brother     Prior to Admission medications   Medication Sig Start Date End Date Taking? Authorizing Provider  amLODipine (NORVASC) 5  MG tablet Take 5 mg by mouth daily.   Yes [provider]  feeding supplement (ENSURE SURGERY) LIQD Take 237 mLs by mouth 2 (two) times daily between meals. Patient taking differently: Take 237 mLs by mouth daily.  10/17/18  Yes Mercy Riding, MD  losartan-hydrochlorothiazide (HYZAAR) 100-25 MG tablet Take 0.5 tablets by mouth daily.   Yes [provider]  Multiple Vitamins-Minerals  (MULTIVITAMIN ADULT) CHEW Chew 1 tablet by mouth daily.    Yes [provider]  pantoprazole (PROTONIX) 40 MG tablet Take 40 mg by mouth every other day.    Yes [provider]  cephALEXin (KEFLEX) 500 MG capsule Take 500 mg by mouth every 8 (eight) hours. 12/15/18   [provider]  ondansetron (ZOFRAN) 4 MG tablet Take 1 tablet (4 mg total) by mouth every 8 (eight) hours as needed for nausea. Patient not taking: Reported on 12/26/2018 10/25/18   Michael Boston, MD  polyethylene glycol [facility] Floria Raveling) packet Take 17 g by mouth daily. Patient not taking: Reported on 12/26/2018 09/02/18   Kayleen Memos, DO  senna-docusate (SENOKOT-S) 8.6-50 MG tablet Take 2 tablets by mouth at bedtime as needed for mild constipation. Patient not taking: Reported on 12/26/2018 09/02/18   Kayleen Memos, DO  traMADol (ULTRAM) 50 MG tablet Take 1-2 tablets (50-100 mg total) by mouth every 6 (six) hours as needed for moderate pain or severe pain. Patient not taking: Reported on 12/26/2018 10/25/18   Michael Boston, MD    Physical Exam: Constitutional: Moderately built and nourished. Vitals:   12/26/18 1830 12/26/18 1845 12/26/18 1900 12/26/18 2233  BP: (!) 150/74 (!) 158/84 (!) 174/75 (!) 160/82  Pulse: 85 83  86  Resp:    18  Temp:    98.1 F (36.7 C)  TempSrc:    Oral  SpO2: 100% 100%  100%  Weight:    66 kg  Height:    5\' 7"  (1.702 m)   Eyes: Anicteric no pallor. ENMT: No discharge from the ears eyes nose or mouth. Neck: No mass felt.  No neck rigidity. Respiratory: No rhonchi or crepitations. Cardiovascular: S1-S2 heard. Abdomen: Soft nontender bowel sounds present. Musculoskeletal: No edema. Skin: No rash. Neurologic: Alert awake oriented to time place and person.  Moves all extremities. Psychiatric: Appears normal.  Normal affect.   Labs on Admission: I have personally reviewed following labs and imaging studies  CBC: Recent Labs  Lab 12/26/18 1305  WBC 4.6  HGB  9.8*  HCT 29.5*  MCV 73.0*  PLT 098   Basic Metabolic Panel: Recent Labs  Lab 12/26/18 1305  NA 136  K 2.9*  CL 100  CO2 24  GLUCOSE 119*  BUN 11  CREATININE 0.86  CALCIUM 10.0   GFR: Estimated Creatinine Clearance: 50.7 mL/min (by C-G formula based on SCr of 0.86 mg/dL). Liver Function Tests: No results for input(s): AST, ALT, ALKPHOS, BILITOT, PROT, ALBUMIN in the last 168 hours. No results for input(s): LIPASE, AMYLASE in the last 168 hours. No results for input(s): AMMONIA in the last 168 hours. Coagulation Profile: No results for input(s): INR, PROTIME in the last 168 hours. Cardiac Enzymes: No results for input(s): CKTOTAL, CKMB, CKMBINDEX, TROPONINI in the last 168 hours. BNP (last 3 results) No results for input(s): PROBNP in the last 8760 hours. HbA1C: No results for input(s): HGBA1C in the last 72 hours. CBG: Recent Labs  Lab 12/26/18 1611  GLUCAP 94   Lipid Profile: No results for input(s):  CHOL, HDL, LDLCALC, TRIG, CHOLHDL, LDLDIRECT in the last 72 hours. Thyroid Function Tests: No results for input(s): TSH, T4TOTAL, FREET4, T3FREE, THYROIDAB in the last 72 hours. Anemia Panel: No results for input(s): VITAMINB12, FOLATE, FERRITIN, TIBC, IRON, RETICCTPCT in the last 72 hours. Urine analysis:    Component Value Date/Time   COLORURINE STRAW (A) 12/26/2018 1700   APPEARANCEUR CLEAR 12/26/2018 1700   LABSPEC 1.009 12/26/2018 1700   PHURINE 7.0 12/26/2018 1700   GLUCOSEU NEGATIVE 12/26/2018 1700   HGBUR NEGATIVE 12/26/2018 1700   BILIRUBINUR NEGATIVE 12/26/2018 1700   KETONESUR 5 (A) 12/26/2018 1700   PROTEINUR NEGATIVE 12/26/2018 1700   UROBILINOGEN 0.2 02/16/2014 1737   NITRITE NEGATIVE 12/26/2018 1700   LEUKOCYTESUR NEGATIVE 12/26/2018 1700   Sepsis Labs: @LABRCNTIP (procalcitonin:4,lacticidven:4) ) Recent Results (from the past 240 hour(s))  SARS Coronavirus 2 (CEPHEID - Performed in [place] hospital lab), Hosp Order     Status: None    Collection Time: 12/26/18  6:46 PM   Specimen: Nasopharyngeal Swab  Result Value Ref Range Status   SARS Coronavirus 2 NEGATIVE NEGATIVE Final    Comment: (NOTE) If result is NEGATIVE SARS-CoV-2 target nucleic acids are NOT DETECTED. The SARS-CoV-2 RNA is generally detectable in upper and lower  respiratory specimens during the acute phase of infection. The lowest  concentration of SARS-CoV-2 viral copies this assay can detect is 250  copies / mL. A negative result does not preclude SARS-CoV-2 infection  and should not be used as the sole basis for treatment or other  patient management decisions.  A negative result may occur with  improper specimen collection / handling, submission of specimen other  than nasopharyngeal swab, presence of viral mutation(s) within the  areas targeted by this assay, and inadequate number of viral copies  (<250 copies / mL). A negative result must be combined with clinical  observations, patient history, and epidemiological information. If result is POSITIVE SARS-CoV-2 target nucleic acids are DETECTED. The SARS-CoV-2 RNA is generally detectable in upper and lower  respiratory specimens dur ing the acute phase of infection.  Positive  results are indicative of active infection with SARS-CoV-2.  Clinical  correlation with patient history and other diagnostic information is  necessary to determine patient infection status.  Positive results do  not rule out bacterial infection or co-infection with other viruses. If result is PRESUMPTIVE POSTIVE SARS-CoV-2 nucleic acids MAY BE PRESENT.   A presumptive positive result was obtained on the submitted specimen  and confirmed on repeat testing.  While 2019 novel coronavirus  (SARS-CoV-2) nucleic acids may be present in the submitted sample  additional confirmatory testing may be necessary for epidemiological  and / or clinical management purposes  to differentiate between  SARS-CoV-2 and other Sarbecovirus  currently known to infect humans.  If clinically indicated additional testing with an alternate test  methodology [phone]) is advised. The SARS-CoV-2 RNA is generally  detectable in upper and lower respiratory sp ecimens during the acute  phase of infection. The expected result is Negative. Fact Sheet for Patients:  StrictlyIdeas.no Fact Sheet for Healthcare Providers: BankingDealers.co.za This test is not yet approved or cleared by the [place] FDA and has been authorized for detection and/or diagnosis of SARS-CoV-2 by FDA under an Emergency Use Authorization (EUA).  This EUA will remain in effect (meaning this test can be used) for the duration of the COVID-19 declaration under Section 564(b)(1) of the Act, 21 U.S.C. section 360bbb-3(b)(1), unless the authorization is terminated or revoked sooner. Performed  at [facility], [place] [address], [place], [place] [address]      Radiological Exams on Admission: Dg Chest Port 1 View  Result Date: 12/26/2018 CLINICAL DATA:  Generalized weakness starting 4 days ago, some dizziness. Esophageal surgery on May 12th. EXAM: PORTABLE CHEST 1 VIEW COMPARISON:  Chest x-rays dated 10/15/2018 and 02/16/2014. FINDINGS: Stable cardiomegaly. Overall cardiomediastinal silhouette is not significantly changed lungs are clear. No pleural effusion or pneumothorax seen. Osseous structures about the chest are unremarkable. IMPRESSION: No active disease. No evidence of pneumonia or pulmonary edema. Stable cardiomegaly. Electronically Signed   By: Franki Cabot M.D.   On: 12/26/2018 18:53    EKG: Independently reviewed.  Normal sinus rhythm with PVCs and incomplete left bundle branch block.  Assessment/Plan Principal Problem:   Weakness generalized Active Problems:   HTN (hypertension)   Hypokalemia   Elevated troponin    1. Generalized weakness with weight loss -cause not clear.  Will get a nutrition  consult.  Check LFTs to check for albumin levels.  May discuss with patient's gastroenterologist Dr. Mary Sella in the morning about patient's weight loss. 2. Elevated troponin -denies any chest Pain.  We will cycle cardiac markers.  Consult cardiology. 3. Hypokalemia could be due to hydrochlorothiazide and patient's poor appetite.  Replace recheck check magnesium.  Hold hydrochlorothiazide. 4. Microcytic hypochromic anemia appears to be chronic.  Will check anemia panel follow CBC. 5. History of esophageal strictures and recent resection of distal esophageal diverticulum in May 2020.  On PPI.  6. Hypertension on amlodipine and ARB.  Holding hydrochlorothiazide due to hypokalemia.   DVT prophylaxis: Lovenox. Code Status: Full code. Family Communication: Discussed with patient. Disposition Plan: Home. Consults called: Cardiology. Admission status: Observation.   Rise Patience MD [facility] Pager [phone].  If 7PM-7AM, please contact night-coverage [email or web address] Password [email or web address]  12/27/2018, 2:25 AM

## 2019-01-11 NOTE — Progress Notes (Deleted)
Cardiology Office Note   Date:  01/11/2019   ID:  Nehal, Witting 01-10-1936, MRN [id number]  PCP:  Lawerance Cruel, MD  Cardiologist:  Jenkins Rouge, MD   No chief complaint on file.   History of Present Illness: Shannon Ortiz is a 83 y.o. female with a hx of hypertension, CKD Stage III, hypothyroidism, esophageal stenosis, s/p esophageal diverticulectomy and hiatal hernia repair 10/25/18 who is being seen today for post hospital follow up.   Shannon Ortiz was seen in 09/2018 by Dr. Johnsie Cancel for clearance to have general anesthesia and surgery. She had been having chest pain and dysphagia. She was refractory to prior interventions.  Her chest pain was felt to be atypical and no testing recommended. On 10/25/2018 she had esophageal epiphrenic diverticulectomy, fundoplication and hiatal hernia repair.    She was admitted to the hospital 10/15/18-10/17/18 with c/o dizziness and weakness. She was found to be dehydrated with AKI. Her HCTZ and ARB were held. She improved with IV fluids and was discharged on amlodipine.   She was admitted to [facility] 12/26/2018 with c/o generalized weakness over 3-4 days. She denied any chest pain/pressure/tighttness or shortness of breath. She had no complaints of orthopnea, PND or edema. Electrocardiogram shows sinus rhythm with PVCs and lateral T wave inversion. Troponin-221, 202, 213, 214. Hemoglobin 8.7 with MCV 70.9.  TSH 0.821. On PE, she was noted to have a slight murmur on exam with no prior echo on file. An echocardiogram was performed on 12/27/2018 with LVEF of 55=60% with impaired relaxation. There was trivial pericardial effusion present circumferentially. There was no significant valvular disease. -Plan was for follow up for possible consideration of functional study??   1. Elevated troponin on recent hospitalization: -Denied anginal symptoms -Echocardiogram essentially normal  -Plan for further workup with stress testing given funcrtional workup?     2. HTN: Continue losartan and amlodipine.  Follow blood pressure and increase dose if needed.  I do not think she requires HCTZ at this point.  Agree with discontinuing.   3. Hypokalemia:   4. S/p esophageal surgery: Pt doing well, no dysphagia, eating much better   Past Medical History:  Diagnosis Date  . Acquired diverticulum of distal esophagus with dysphagia 10/03/2018  . AKI (acute kidney injury) ([place]) 08/31/2018  . Anxiety   . Bradycardia 02/16/2014  . Chest tightness   . DDD (degenerative disc disease), lumbar   . DJD (degenerative joint disease)   . Dry eyes   . Esophageal stenosis 08/31/2018  . Facial pain   . Fibromyalgia   . Hemorrhoid   . Hypertension   . Hypokalemia 08/31/2018  . Hypothyroidism   . Lightheadedness   . Near syncope 02/16/2014  . Osteopenia   . Sigmoid diverticulosis   . Thyroid nodule    PT DENIES    Past Surgical History:  Procedure Laterality Date  . ABDOMINAL HYSTERECTOMY     COMPLETE  . BALLOON DILATION N/A 09/01/2018   Procedure: BALLOON DILATION;  Surgeon: Ronnette Juniper, MD;  Location: [place];  Service: Gastroenterology;  Laterality: N/A;  . BIOPSY  09/01/2018   Procedure: BIOPSY;  Surgeon: Ronnette Juniper, MD;  Location: [facility] ENDOSCOPY;  Service: Gastroenterology;;  . Lum Keas INJECTION  09/01/2018   Procedure: BOTOX INJECTION;  Surgeon: Ronnette Juniper, MD;  Location: [place];  Service: Gastroenterology;;  . BREAST SURGERY     breast biopsy  . CHOLECYSTECTOMY    . ESOPHAGEAL MANOMETRY N/A 10/07/2018   Procedure: ESOPHAGEAL  MANOMETRY (EM);  Surgeon: Ronnette Juniper, MD;  Location: [facility];  Service: Gastroenterology;  Laterality: N/A;  . ESOPHAGOGASTRODUODENOSCOPY N/A 10/25/2018   Procedure: ESOPHAGOGASTRODUODENOSCOPY (EGD);  Surgeon: Michael Boston, MD;  [facility];  Service: General;  Laterality: N/A;  . ESOPHAGOGASTRODUODENOSCOPY (EGD) WITH PROPOFOL N/A 09/01/2018   Procedure: ESOPHAGOGASTRODUODENOSCOPY (EGD) WITH PROPOFOL;  Surgeon:  Ronnette Juniper, MD;  Location: [place];  Service: Gastroenterology;  Laterality: N/A;     Current Outpatient Medications  Medication Sig Dispense Refill  . amLODipine (NORVASC) 5 MG tablet Take 5 mg by mouth daily.    Marland Kitchen aspirin EC 81 MG EC tablet Take 1 tablet (81 mg total) by mouth daily.    . feeding supplement (ENSURE SURGERY) LIQD Take 237 mLs by mouth 2 (two) times daily between meals. (Patient taking differently: Take 237 mLs by mouth daily. ) 60 Bottle 0  . losartan (COZAAR) 50 MG tablet Take 1 tablet (50 mg total) by mouth daily. 30 tablet 0  . Multiple Vitamins-Minerals (MULTIVITAMIN ADULT) CHEW Chew 1 tablet by mouth daily.     . ondansetron (ZOFRAN) 4 MG tablet Take 1 tablet (4 mg total) by mouth every 8 (eight) hours as needed for nausea. (Patient not taking: Reported on 12/26/2018) 8 tablet 5  . pantoprazole (PROTONIX) 40 MG tablet Take 40 mg by mouth every other day.      No current facility-administered medications for this visit.     Allergies:   Elavil [amitriptyline hcl] and Ketek [telithromycin]    Social History:  The patient  reports that she has never smoked. She has never used smokeless tobacco. She reports that she does not drink alcohol or use drugs.   Family History:  The patient's ***family history includes CAD in her brother; Diabetes in her mother; Hypertension in her mother.    ROS:  Please see the history of present illness.   Otherwise, review of systems are positive for {NONE DEFAULTED:18576::"none"}.   All other systems are reviewed and negative.    PHYSICAL EXAM: VS:  There were no vitals taken for this visit. , BMI There is no height or weight on file to calculate BMI. GEN: Well nourished, well developed, in no acute distress HEENT: normal Neck: no JVD, carotid bruits, or masses Cardiac: ***RRR; no murmurs, rubs, or gallops,no edema  Respiratory:  clear to auscultation bilaterally, normal work of breathing GI: soft, nontender, nondistended, + BS  MS: no deformity or atrophy Skin: warm and dry, no rash Neuro:  Strength and sensation are intact Psych: euthymic mood, full affect   EKG:  EKG {ACTION; IS/IS JSE:[id number]} ordered today. The ekg ordered today demonstrates ***   Recent Labs: 12/27/2018: ALT 7; BUN 6; Creatinine, Ser 0.68; Hemoglobin 8.7; Magnesium 1.8; Platelets 203; Potassium 3.3; Sodium 135; TSH 0.821    Lipid Panel No results found for: CHOL, TRIG, HDL, CHOLHDL, VLDL, LDLCALC, LDLDIRECT    Wt Readings from Last 3 Encounters:  12/27/18 144 lb 8 oz (65.5 kg)  10/28/18 170 lb 3.1 oz (77.2 kg)  10/24/18 167 lb (75.8 kg)      Other studies Reviewed: Additional studies/ records that were reviewed today include:   Echocardiogram 12/27/2018:  1. The left ventricle has normal systolic function, with an ejection fraction of 55-60%. The cavity size was normal. There is mildly increased left ventricular wall thickness. Left ventricular diastolic Doppler parameters are consistent with impaired  relaxation. No evidence of left ventricular regional wall motion abnormalities.  2. The right ventricle has normal  systolic function. The cavity was normal. There is no increase in right ventricular wall thickness.  3. Left atrial size was moderately dilated.  4. The pericardial effusion is circumferential.  5. Trivial pericardial effusion is present.  6. Mild mitral valve prolapse.  7. The mitral valve is abnormal. Mild thickening of the mitral valve leaflet.  8. The tricuspid valve is grossly normal.  9. The aortic valve is tricuspid. Mild sclerosis of the aortic valve. No stenosis of the aortic valve.   ASSESSMENT AND PLAN:  1.  ***   Current medicines are reviewed at length with the patient today.  The patient {ACTIONS; HAS/DOES NOT HAVE:19233} concerns regarding medicines.  The following changes have been made:  {PLAN; NO CHANGE:13088:s}  Labs/ tests ordered today include: *** No orders of the defined types were  placed in this encounter.    Disposition:   FU with *** in {gen number 5-88:325498} {Days to years:10300}  Signed, Kathyrn Drown, NP  01/11/2019 9:02 AM    [facility] [place], [place], [place]  [address] Phone: [phone]; Fax: [phone]

## 2019-01-24 ENCOUNTER — Ambulatory Visit: Payer: Medicare Other | Admitting: Cardiology

## 2019-01-26 ENCOUNTER — Telehealth (INDEPENDENT_AMBULATORY_CARE_PROVIDER_SITE_OTHER): Payer: Medicare Other | Admitting: Cardiology

## 2019-01-26 ENCOUNTER — Other Ambulatory Visit: Payer: Self-pay

## 2019-01-26 ENCOUNTER — Encounter: Payer: Self-pay | Admitting: Cardiology

## 2019-01-26 VITALS — BP 140/80 | HR 53 | Ht 67.0 in | Wt 150.4 lb

## 2019-01-26 DIAGNOSIS — E876 Hypokalemia: Secondary | ICD-10-CM

## 2019-01-26 DIAGNOSIS — R778 Other specified abnormalities of plasma proteins: Secondary | ICD-10-CM

## 2019-01-26 DIAGNOSIS — I10 Essential (primary) hypertension: Secondary | ICD-10-CM

## 2019-01-26 DIAGNOSIS — R001 Bradycardia, unspecified: Secondary | ICD-10-CM | POA: Diagnosis not present

## 2019-01-26 DIAGNOSIS — I495 Sick sinus syndrome: Secondary | ICD-10-CM

## 2019-01-26 NOTE — Patient Instructions (Addendum)
Medication Instructions:  Your physician recommends that you continue on your current medications as directed. Please refer to the Current Medication list given to you today.  If you need a refill on your cardiac medications before your next appointment, please call your pharmacy.   Lab work: TODAY:  BMET  If you have labs (blood work) drawn today and your tests are completely normal, you will receive your results only by: Marland Kitchen MyChart Message (if you have MyChart) OR . A paper copy in the mail If you have any lab test that is abnormal or we need to change your treatment, we will call you to review the results.  Testing/Procedures: Your physician has recommended that you wear an heart monitor. THEY WILL CALL YOU TO ARRANGE THIS.   Heart monitors are medical devices that record the heart's electrical activity. Doctors most often [place] these monitors to diagnose arrhythmias. Arrhythmias are problems with the speed or rhythm of the heartbeat. The monitor is a small, portable device. You can wear one while you do your normal daily activities. This is usually used to diagnose what is causing palpitations/syncope (passing out).    Follow-Up: At [facility], you and your health needs are our priority.  As part of our continuing mission to provide you with exceptional heart care, we have created designated Provider Care Teams.  These Care Teams include your primary Cardiologist (physician) and Advanced Practice Providers (APPs -  Physician Assistants and Nurse Practitioners) who all work together to provide you with the care you need, when you need it. You will need a follow up appointment in 02/28/2019 AT 1:25 in office.  Any Other Special Instructions Will Be Listed Below (If Applicable).

## 2019-01-26 NOTE — Progress Notes (Signed)
Virtual Visit via Video Note   This visit type was conducted due to national recommendations for restrictions regarding the COVID-19 Pandemic (e.g. social distancing) in an effort to limit this patient's exposure and mitigate transmission in our community.  Due to her co-morbid illnesses, this patient is at least at moderate risk for complications without adequate follow up.  This format is felt to be most appropriate for this patient at this time.  All issues noted in this document were discussed and addressed.  A limited physical exam was performed with this format.  Please refer to the patient's chart for her consent to telehealth for [facility].   Date:  01/26/2019   ID:  Shannon Ortiz, DOB [birth year], MRN [id number]  Patient Location: Other:  office Provider Location: Home  PCP:  Lawerance Cruel, MD  Cardiologist:  Jenkins Rouge, MD  Electrophysiologist:  None   Evaluation Performed:  Follow-Up Visit  Chief Complaint:  post hospital for weakness.  History of Present Illness:    Shannon Ortiz is a 83 y.o. female with post hospitalization  She has a hx of Hypertension, CKD stage 3, hypothyroidism, esophageal stenosis, s/p esophageal diverticulectomy and hiatal hernia repair 10/25/18   Hospitalized 12/27/18 with weakness.  occ rapid HR.   K+ was 2.9 and was anemic with Hgb at 8.7   troponins mildly elevated at 221 to 202   CK neg.  .  EKG Sinus tachycardia, 101 bpm, LAD, PVC, Incomplete left bundle branch block  Not felt to be ACS and Echo with EF 55-60%, G1DD, mild late systolic bileaflet prolapse of MV with trivial MR, mild TR mild pulmonary HTN.    Today -she did have labs checked with PCP.  Daughter with her today.  No chest pain, no SOB, improved weakness.  No lightheadedness she does have palpitations at night.  No swelling off the HCTZ. Her daughter is will her today for appt.    The patient does not have symptoms concerning for COVID-19 infection (fever, chills, cough,  or new shortness of breath).  She tested neg in hospital.    Past Medical History:  Diagnosis Date  . Acquired diverticulum of distal esophagus with dysphagia 10/03/2018  . AKI (acute kidney injury) ([place]) 08/31/2018  . Anxiety   . Bradycardia 02/16/2014  . Chest tightness   . DDD (degenerative disc disease), lumbar   . DJD (degenerative joint disease)   . Dry eyes   . Esophageal stenosis 08/31/2018  . Facial pain   . Fibromyalgia   . Hemorrhoid   . Hypertension   . Hypokalemia 08/31/2018  . Hypothyroidism   . Lightheadedness   . Near syncope 02/16/2014  . Osteopenia   . Sigmoid diverticulosis   . Thyroid nodule    PT DENIES   Past Surgical History:  Procedure Laterality Date  . ABDOMINAL HYSTERECTOMY     COMPLETE  . BALLOON DILATION N/A 09/01/2018   Procedure: BALLOON DILATION;  Surgeon: Ronnette Juniper, MD;  Location: [place];  Service: Gastroenterology;  Laterality: N/A;  . BIOPSY  09/01/2018   Procedure: BIOPSY;  Surgeon: Ronnette Juniper, MD;  Location[facility] ENDOSCOPY;  Service: Gastroenterology;;  . Lum Keas INJECTION  09/01/2018   Procedure: BOTOX INJECTION;  Surgeon: Ronnette Juniper, MD;  Location: [place];  Service: Gastroenterology;;  . BREAST SURGERY     breast biopsy  . CHOLECYSTECTOMY    . ESOPHAGEAL MANOMETRY N/A 10/07/2018   Procedure: ESOPHAGEAL MANOMETRY (EM);  Surgeon: Ronnette Juniper, MD;  Location: [facility]  ENDOSCOPY;  Service: Gastroenterology;  Laterality: N/A;  . ESOPHAGOGASTRODUODENOSCOPY N/A 10/25/2018   Procedure: ESOPHAGOGASTRODUODENOSCOPY (EGD);  Surgeon: Michael Boston, MD;  Location: [facility];  Service: General;  Laterality: N/A;  . ESOPHAGOGASTRODUODENOSCOPY (EGD) WITH PROPOFOL N/A 09/01/2018   Procedure: ESOPHAGOGASTRODUODENOSCOPY (EGD) WITH PROPOFOL;  Surgeon: Ronnette Juniper, MD;  Location: [place];  Service: Gastroenterology;  Laterality: N/A;     Current Meds  Medication Sig  . amLODipine (NORVASC) 5 MG tablet Take 5 mg by mouth daily.  Marland Kitchen aspirin EC 81 MG EC tablet  Take 1 tablet (81 mg total) by mouth daily.  . famotidine (PEPCID) 40 MG tablet Take 20 mg by mouth daily.   . feeding supplement (ENSURE SURGERY) LIQD Take 237 mLs by mouth 2 (two) times daily between meals. (Patient taking differently: Take 237 mLs by mouth daily. )  . losartan (COZAAR) 100 MG tablet Take 100 mg by mouth daily.  . Multiple Vitamins-Minerals (MULTIVITAMIN ADULT) CHEW Chew 1 tablet by mouth daily.   . ondansetron (ZOFRAN) 4 MG tablet Take 1 tablet (4 mg total) by mouth every 8 (eight) hours as needed for nausea.  . pantoprazole (PROTONIX) 40 MG tablet Take 40 mg by mouth every other day.   . [DISCONTINUED] losartan (COZAAR) 50 MG tablet Take 1 tablet (50 mg total) by mouth daily.     Allergies:   Elavil [amitriptyline hcl] and Ketek [telithromycin]   Social History   Tobacco Use  . Smoking status: Never Smoker  . Smokeless tobacco: Never Used  Substance Use Topics  . Alcohol use: No  . Drug use: No     Family Hx: The patient's family history includes CAD in her brother; Diabetes in her mother; Hypertension in her mother.  ROS:   Please see the history of present illness.    General:no colds or fevers, + weight changes if wts correct since May  Skin:no rashes or ulcers HEENT:no blurred vision, no congestion CV:see HPI PUL:see HPI GI:no diarrhea constipation or melena, no indigestion GU:no hematuria, no dysuria MS:no joint pain, no claudication Neuro:no syncope, no lightheadedness Endo:no diabetes, no thyroid disease  All other systems reviewed and are negative.   Prior CV studies:   The following studies were reviewed today: Echo 12/27/18 IMPRESSIONS    1. The left ventricle has normal systolic function, with an ejection fraction of 55-60%. The cavity size was normal. There is mildly increased left ventricular wall thickness. Left ventricular diastolic Doppler parameters are consistent with impaired  relaxation. No evidence of left ventricular regional  wall motion abnormalities.  2. The right ventricle has normal systolic function. The cavity was normal. There is no increase in right ventricular wall thickness.  3. Left atrial size was moderately dilated.  4. The pericardial effusion is circumferential.  5. Trivial pericardial effusion is present.  6. Mild mitral valve prolapse.  7. The mitral valve is abnormal. Mild thickening of the mitral valve leaflet.  8. The tricuspid valve is grossly normal.  9. The aortic valve is tricuspid. Mild sclerosis of the aortic valve. No stenosis of the aortic valve.  SUMMARY   LVEF 55-60%, mild LVH, normal wall motion, grade 1 DD, normal LV filling pressure, normal RV systolic function, moderate LAE, mild late systolic bileaflet prolapse of the mitral valve with trivial MR, mild TR, RVSP 40 mmHg (mild pulmonary hypertension), normal IVC, trivial circumferential pericardial effusion  FINDINGS  Left Ventricle: The left ventricle has normal systolic function, with an ejection fraction of 55-60%. The cavity size was  normal. There is mildly increased left ventricular wall thickness. Left ventricular diastolic Doppler parameters are consistent  with impaired relaxation. Normal left ventricular filling pressures No evidence of left ventricular regional wall motion abnormalities..  Right Ventricle: The right ventricle has normal systolic function. The cavity was normal. There is no increase in right ventricular wall thickness.  Left Atrium: Left atrial size was moderately dilated.  Right Atrium: Right atrial size was normal in size. Right atrial pressure is estimated at 10 mmHg.  Interatrial Septum: No atrial level shunt detected by color flow Doppler.  Pericardium: Trivial pericardial effusion is present. The pericardial effusion is circumferential.  Mitral Valve: The mitral valve is abnormal. Mild thickening of the mitral valve leaflet. Mitral valve regurgitation is mild by color flow Doppler.  There is mild late systolic prolapse of of both mitral leaflets of the mitral valve.  Tricuspid Valve: The tricuspid valve is grossly normal. Tricuspid valve regurgitation is mild by color flow Doppler.  Aortic Valve: The aortic valve is tricuspid Mild sclerosis of the aortic valve. Aortic valve regurgitation was not visualized by color flow Doppler. There is No stenosis of the aortic valve.  Pulmonic Valve: The pulmonic valve was grossly normal. Pulmonic valve regurgitation is trivial by color flow Doppler.  Venous: The inferior vena cava measures 1.55 cm, is normal in size with greater than 50% respiratory variability.     Labs/Other Tests and Data Reviewed:    EKG:  An ECG dated 12/26/18 was personally reviewed today and demonstrated:  SR with PVCs LAD and Intraventricular conduction delay, non specific ST abnormalities. no acute changes,   Recent Labs: 12/27/2018: ALT 7; BUN 6; Creatinine, Ser 0.68; Hemoglobin 8.7; Magnesium 1.8; Platelets 203; Potassium 3.3; Sodium 135; TSH 0.821   Recent Lipid Panel No results found for: CHOL, TRIG, HDL, CHOLHDL, LDLCALC, LDLDIRECT  Wt Readings from Last 3 Encounters:  01/26/19 150 lb 6.4 oz (68.2 kg)  12/27/18 144 lb 8 oz (65.5 kg)  10/28/18 170 lb 3.1 oz (77.2 kg)     Objective:    Vital Signs:  BP 140/80   Pulse (!) 53   Ht 5\' 7"  (1.702 m)   Wt 150 lb 6.4 oz (68.2 kg)   SpO2 99%   BMI 23.56 kg/m    VITAL SIGNS:  reviewed  General pleasant female in NAD Lungs could speak in complete sentences without SOB or wheezes Neuro A&O X 3 Follows commands answers questions approp PSYCH pleasant affect   ASSESSMENT & PLAN:    1. Tachy brady arrhythmia may be playing a role in weakness will have her wear 2 week zio patch.  She also complains of palpitations.   2. Hypokalemia now off HCTZ without edema or SOB, will repeat BMP to re-eval 3. Elevated troponin, not ACS, no angina and normal EF on echo.   4. HTN controlled no change in  meds.  Will need to monitor COVID-19 Education: The signs and symptoms of COVID-19 were discussed with the patient and how to seek care for testing (follow up with PCP or arrange E-visit).  The importance of social distancing was discussed today.  Time:   Today, I have spent 10 minutes with the patient with telehealth technology discussing the above problems.     Medication Adjustments/Labs and Tests Ordered: Current medicines are reviewed at length with the patient today.  Concerns regarding medicines are outlined above.   Tests Ordered: Orders Placed This Encounter  Procedures  . Basic metabolic panel  . LONG TERM  MONITOR-LIVE TELEMETRY (3-14 DAYS)    Medication Changes: No orders of the defined types were placed in this encounter.   Follow Up:  In Person in 4 week(s)  Signed, Cecilie Kicks, NP  01/26/2019 5:04 PM    [place]

## 2019-01-27 ENCOUNTER — Telehealth: Payer: Self-pay | Admitting: Radiology

## 2019-01-27 LAB — BASIC METABOLIC PANEL
BUN/Creatinine Ratio: 14 (ref 12–28)
BUN: 11 mg/dL (ref 8–27)
CO2: 24 mmol/L (ref 20–29)
Calcium: 10 mg/dL (ref 8.7–10.3)
Chloride: 103 mmol/L (ref 96–106)
Creatinine, Ser: 0.79 mg/dL (ref 0.57–1.00)
GFR calc Af Amer: 82 mL/min/{1.73_m2} (ref >59)
GFR calc non Af Amer: 71 mL/min/{1.73_m2} (ref >59)
Glucose: 112 mg/dL — ABNORMAL HIGH (ref 65–99)
Potassium: 3.6 mmol/L (ref 3.5–5.2)
Sodium: 139 mmol/L (ref 134–144)

## 2019-01-27 NOTE — Telephone Encounter (Signed)
Enrolled patient for a 14 day Zio telemetry monitor to be mailed. Brief instructions were gone over with the patient and she knows to expect the monitor to arrive in 3-4 days.  

## 2019-01-27 NOTE — Progress Notes (Signed)
Pt has been made aware of normal result and verbalized understanding.  jw 01/27/2019

## 2019-02-02 ENCOUNTER — Ambulatory Visit (INDEPENDENT_AMBULATORY_CARE_PROVIDER_SITE_OTHER): Payer: Medicare Other

## 2019-02-02 DIAGNOSIS — R001 Bradycardia, unspecified: Secondary | ICD-10-CM | POA: Diagnosis not present

## 2019-02-06 ENCOUNTER — Telehealth: Payer: Self-pay | Admitting: Cardiovascular Disease

## 2019-02-06 NOTE — Telephone Encounter (Signed)
Spoke with Margreta Journey  With [facility]  Re monitor recording Pt had episode of afib at 10:36 am [place] time today lasting for 90 sec rate 117-161 Awaiting hard copy to review with Dr Caryl Comes DOD ./cy

## 2019-02-06 NOTE — Telephone Encounter (Signed)
°  1. Is this related to a heart monitor you are wearing?  (If the patient says no, please ask     if they are caling about ICD/pacemaker.) yes   2. What is your issue??  Yes critical reading on live zio to report to triage  Please route to covering RN/CMA/RMA for results. Route to monitor technicians or your monitor tech representative for your site for any technical concerns

## 2019-02-06 NOTE — Telephone Encounter (Signed)
Pt aware will continue to monitor and tx as inidcated ./cy

## 2019-02-06 NOTE — Telephone Encounter (Signed)
Discussed with Dr Caryl Comes and no clear indication for rate control or anticoagulation at this time Continue to monitor./cy

## 2019-02-28 ENCOUNTER — Telehealth: Payer: Self-pay | Admitting: *Deleted

## 2019-02-28 ENCOUNTER — Encounter: Payer: Self-pay | Admitting: Physician Assistant

## 2019-02-28 ENCOUNTER — Other Ambulatory Visit: Payer: Self-pay

## 2019-02-28 ENCOUNTER — Ambulatory Visit (INDEPENDENT_AMBULATORY_CARE_PROVIDER_SITE_OTHER): Payer: Medicare Other | Admitting: Physician Assistant

## 2019-02-28 VITALS — BP 142/80 | HR 97 | Ht 67.0 in | Wt 145.1 lb

## 2019-02-28 DIAGNOSIS — E876 Hypokalemia: Secondary | ICD-10-CM | POA: Diagnosis not present

## 2019-02-28 DIAGNOSIS — R001 Bradycardia, unspecified: Secondary | ICD-10-CM | POA: Diagnosis not present

## 2019-02-28 DIAGNOSIS — I495 Sick sinus syndrome: Secondary | ICD-10-CM

## 2019-02-28 DIAGNOSIS — I10 Essential (primary) hypertension: Secondary | ICD-10-CM

## 2019-02-28 NOTE — Patient Instructions (Signed)
Medication Instructions:  Your physician recommends that you continue on your current medications as directed. Please refer to the Current Medication list given to you today.  If you need a refill on your cardiac medications before your next appointment, please call your pharmacy.   Lab work: None ordered  If you have labs (blood work) drawn today and your tests are completely normal, you will receive your results only by: . MyChart Message (if you have MyChart) OR . A paper copy in the mail If you have any lab test that is abnormal or we need to change your treatment, we will call you to review the results.  Testing/Procedures: None ordered  Follow-Up: At [facility], you and your health needs are our priority.  As part of our continuing mission to provide you with exceptional heart care, we have created designated Provider Care Teams.  These Care Teams include your primary Cardiologist (physician) and Advanced Practice Providers (APPs -  Physician Assistants and Nurse Practitioners) who all work together to provide you with the care you need, when you need it. You will need a follow up appointment in 3 months.  Please call our office 2 months in advance to schedule this appointment.  You may see Peter Nishan, MD or one of the following Advanced Practice Providers on your designated Care Team:   Lori Gerhardt, NP Laura Ingold, NP . Jill McDaniel, NP  Any Other Special Instructions Will Be Listed Below (If Applicable).    

## 2019-02-28 NOTE — Telephone Encounter (Signed)
Pt has been made aware of her monitor results.  Her grandson stated that he will have pt here in the morning, 03/01/2019, to see Dr. Lovena Le

## 2019-02-28 NOTE — Addendum Note (Signed)
Addended by: Dominga Ferry on: 02/28/2019 04:35 PM   Modules accepted: Level of Service

## 2019-02-28 NOTE — Progress Notes (Addendum)
Cardiology Office Note    Date:  02/28/2019   ID:  [name], DOB [birth year], MRN [id number]  PCP:  Lawerance Cruel, MD  Cardiologist:  Dr. Johnsie Cancel  Chief Complaint: 1  Months follow up  History of Present Illness:   Shannon Ortiz is a 83 y.o. female with a hx of hypertension, CKD stage 3, hypothyroidism, esophageal stenosis, s/p esophageal diverticulectomy and hiatal hernia repair 10/25/18 presents for follow up.   Admitted 12/2018 with weakness and occasional heart racing. Noted hypokalemic and anemic at hgb of 8.7. stopped HCTZ. Troponin mildly elevated, not in ACE pattern. Echo with EF 55-60%, G1DD, mild late systolic bileaflet prolapse of MV with trivial MR, mild TR mild pulmonary HTN.  Seen by Paul Half 01/26/2019 virtually for follow up. Weakness improved. Still intermittent palpitation. Ordered 14 days ZIO given concern for tachybrady syndrome. Per note 8/24> may be episode of atrial fibrillation>> DOD Dr. Caryl Comes recommended to continue to monitor. Pending final result.   Patient is here for follow-up.  She turned in monitor last week however results still not available for review.  She does household chores without any difficulty.  Her weakness continues to improve.  She denies chest pain, shortness of breath, dizziness, orthopnea, palpitation, PND, lower extremity edema or melena.   Past Medical History:  Diagnosis Date  . Acquired diverticulum of distal esophagus with dysphagia 10/03/2018  . AKI (acute kidney injury) ([place]) 08/31/2018  . Anxiety   . Bradycardia 02/16/2014  . Chest tightness   . DDD (degenerative disc disease), lumbar   . DJD (degenerative joint disease)   . Dry eyes   . Esophageal stenosis 08/31/2018  . Facial pain   . Fibromyalgia   . Hemorrhoid   . Hypertension   . Hypokalemia 08/31/2018  . Hypothyroidism   . Lightheadedness   . Near syncope 02/16/2014  . Osteopenia   . Sigmoid diverticulosis   . Thyroid nodule    PT DENIES    Past Surgical  History:  Procedure Laterality Date  . ABDOMINAL HYSTERECTOMY     COMPLETE  . BALLOON DILATION N/A 09/01/2018   Procedure: BALLOON DILATION;  Surgeon: Ronnette Juniper, MD;  Location: [place];  Service: Gastroenterology;  Laterality: N/A;  . BIOPSY  09/01/2018   Procedure: BIOPSY;  Surgeon: Ronnette Juniper, MD;  Location: [facility] ENDOSCOPY;  Service: Gastroenterology;;  . Lum Keas INJECTION  09/01/2018   Procedure: BOTOX INJECTION;  Surgeon: Ronnette Juniper, MD;  Location: [place];  Service: Gastroenterology;;  . BREAST SURGERY     breast biopsy  . CHOLECYSTECTOMY    . ESOPHAGEAL MANOMETRY N/A 10/07/2018   Procedure: ESOPHAGEAL MANOMETRY (EM);  Surgeon: Ronnette Juniper, MD;  Location: [facility] ENDOSCOPY;  Service: Gastroenterology;  Laterality: N/A;  . ESOPHAGOGASTRODUODENOSCOPY N/A 10/25/2018   Procedure: ESOPHAGOGASTRODUODENOSCOPY (EGD);  Surgeon: Michael Boston, MD;  Location: [facility];  Service: General;  Laterality: N/A;  . ESOPHAGOGASTRODUODENOSCOPY (EGD) WITH PROPOFOL N/A 09/01/2018   Procedure: ESOPHAGOGASTRODUODENOSCOPY (EGD) WITH PROPOFOL;  Surgeon: Ronnette Juniper, MD;  Location: [place];  Service: Gastroenterology;  Laterality: N/A;    Current Medications: Prior to Admission medications   Medication Sig Start Date End Date Taking? Authorizing Provider  amLODipine (NORVASC) 5 MG tablet Take 5 mg by mouth daily.    [provider]  aspirin EC 81 MG EC tablet Take 1 tablet (81 mg total) by mouth daily. 12/28/18   Samuella Cota, MD  famotidine (PEPCID) 40 MG tablet Take 20 mg by mouth daily.  01/20/19  [provider]  feeding supplement (ENSURE SURGERY) LIQD Take 237 mLs by mouth 2 (two) times daily between meals. Patient taking differently: Take 237 mLs by mouth daily.  10/17/18   Mercy Riding, MD  losartan (COZAAR) 100 MG tablet Take 100 mg by mouth daily. 01/12/19   [provider]  Multiple Vitamins-Minerals (MULTIVITAMIN ADULT) CHEW Chew 1 tablet by mouth daily.      [provider]  ondansetron (ZOFRAN) 4 MG tablet Take 1 tablet (4 mg total) by mouth every 8 (eight) hours as needed for nausea. 10/25/18   Michael Boston, MD  pantoprazole (PROTONIX) 40 MG tablet Take 40 mg by mouth every other day.     [provider]    Allergies:   Elavil [amitriptyline hcl] and Ketek [telithromycin]   Social History   Socioeconomic History  . Marital status: Widowed    Spouse name: Not on file  . Number of children: Not on file  . Years of education: Not on file  . Highest education level: Not on file  Occupational History  . Not on file  Social Needs  . Financial resource strain: Not on file  . Food insecurity    Worry: Not on file    Inability: Not on file  . Transportation needs    Medical: Not on file    Non-medical: Not on file  Tobacco Use  . Smoking status: Never Smoker  . Smokeless tobacco: Never Used  Substance and Sexual Activity  . Alcohol use: No  . Drug use: No  . Sexual activity: Never  Lifestyle  . Physical activity    Days per week: Not on file    Minutes per session: Not on file  . Stress: Not on file  Relationships  . Social Herbalist on phone: Not on file    Gets together: Not on file    Attends religious service: Not on file    Active member of club or organization: Not on file    Attends meetings of clubs or organizations: Not on file    Relationship status: Not on file  Other Topics Concern  . Not on file  Social History Narrative  . Not on file     Family History:  The patient's family history includes CAD in her brother; Diabetes in her mother; Hypertension in her mother.   ROS:   Please see the history of present illness.    ROS All other systems reviewed and are negative.   PHYSICAL EXAM:   VS:  BP (!) 142/80   Pulse 97   Ht 5\' 7"  (1.702 m)   Wt 145 lb 1.9 oz (65.8 kg)   SpO2 98%   BMI 22.73 kg/m    GEN: Well nourished, well developed, in no acute distress  HEENT: normal   Neck: no JVD, carotid bruits, or masses Cardiac: RRR; no murmurs, rubs, or gallops,no edema  Respiratory:  clear to auscultation bilaterally, normal work of breathing GI: soft, nontender, nondistended, + BS MS: no deformity or atrophy  Skin: warm and dry, no rash Neuro:  Alert and Oriented x 3, Strength and sensation are intact Psych: euthymic mood, full affect  Wt Readings from Last 3 Encounters:  02/28/19 145 lb 1.9 oz (65.8 kg)  01/26/19 150 lb 6.4 oz (68.2 kg)  12/27/18 144 lb 8 oz (65.5 kg)      Studies/Labs Reviewed:   EKG:  EKG is ordered today.  The ekg ordered today demonstrates  sinus rhythm at rate of 97 bpm, incomplete left bundle branch block, PVC  Recent Labs: 12/27/2018: ALT 7; Hemoglobin 8.7; Magnesium 1.8; Platelets 203; TSH 0.821 01/26/2019: BUN 11; Creatinine, Ser 0.79; Potassium 3.6; Sodium 139   Lipid Panel No results found for: CHOL, TRIG, HDL, CHOLHDL, VLDL, LDLCALC, LDLDIRECT  Additional studies/ records that were reviewed today include:   Echocardiogram: 12/27/2018 1. The left ventricle has normal systolic function, with an ejection fraction of 55-60%. The cavity size was normal. There is mildly increased left ventricular wall thickness. Left ventricular diastolic Doppler parameters are consistent with impaired  relaxation. No evidence of left ventricular regional wall motion abnormalities.  2. The right ventricle has normal systolic function. The cavity was normal. There is no increase in right ventricular wall thickness.  3. Left atrial size was moderately dilated.  4. The pericardial effusion is circumferential.  5. Trivial pericardial effusion is present.  6. Mild mitral valve prolapse.  7. The mitral valve is abnormal. Mild thickening of the mitral valve leaflet.  8. The tricuspid valve is grossly normal.  9. The aortic valve is tricuspid. Mild sclerosis of the aortic valve. No stenosis of the aortic valve.  SUMMARY   LVEF 55-60%, mild LVH,  normal wall motion, grade 1 DD, normal LV filling pressure, normal RV systolic function, moderate LAE, mild late systolic bileaflet prolapse of the mitral valve with trivial MR, mild TR, RVSP 40 mmHg (mild pulmonary hypertension), normal IVC, trivial circumferential pericardial effusion  ASSESSMENT & PLAN:    1. Hypertension Initial blood pressure 142/80.  Repeat check 126/80.  Continue amlodipine 5 mg daily and losartan 100 mg daily.  2.  Recent hypokalemia -Resolved  3. PVCs -Noted on EKG.  Asymptomatic.  Pending monitor rhythm to evaluate tachybradycardia arrhythmia.  Addendum: After patient left, monitor result showed 6.8 sec pause and episode of afib/aflutter.  Pending final result. Will arrange stat follow up with EP. Reviewed with DOD Dr. Marlou Porch. No driving.   Medication Adjustments/Labs and Tests Ordered: Current medicines are reviewed at length with the patient today.  Concerns regarding medicines are outlined above.  Medication changes, Labs and Tests ordered today are listed in the Patient Instructions below. Patient Instructions  Medication Instructions:  Your physician recommends that you continue on your current medications as directed. Please refer to the Current Medication list given to you today.  If you need a refill on your cardiac medications before your next appointment, please call your pharmacy.   Lab work: None ordered  If you have labs (blood work) drawn today and your tests are completely normal, you will receive your results only by: Marland Kitchen MyChart Message (if you have MyChart) OR . A paper copy in the mail If you have any lab test that is abnormal or we need to change your treatment, we will call you to review the results.  Testing/Procedures: None ordered  Follow-Up: At [facility], you and your health needs are our priority.  As part of our continuing mission to provide you with exceptional heart care, we have created designated Provider Care Teams.   These Care Teams include your primary Cardiologist (physician) and Advanced Practice Providers (APPs -  Physician Assistants and Nurse Practitioners) who all work together to provide you with the care you need, when you need it. You will need a follow up appointment in 3 months.  Please call our office 2 months in advance to schedule this appointment.  You may see Jenkins Rouge, MD or one of the  following Advanced Practice Providers on your designated Care Team:   Truitt Merle, NP Cecilie Kicks, NP . Kathyrn Drown, NP  Any Other Special Instructions Will Be Listed Below (If Applicable).       Jarrett Soho, [place]  02/28/2019 4:13 PM    [facility] [address], [place], [place]  [address] Phone: [phone]; Fax: [phone]

## 2019-03-01 ENCOUNTER — Encounter: Payer: Self-pay | Admitting: Internal Medicine

## 2019-03-01 ENCOUNTER — Ambulatory Visit (INDEPENDENT_AMBULATORY_CARE_PROVIDER_SITE_OTHER): Payer: Medicare Other | Admitting: Internal Medicine

## 2019-03-01 VITALS — BP 150/88 | HR 76 | Ht 67.0 in | Wt 145.0 lb

## 2019-03-01 DIAGNOSIS — R55 Syncope and collapse: Secondary | ICD-10-CM

## 2019-03-01 LAB — CBC WITH DIFFERENTIAL/PLATELET
Basophils Absolute: 0 10*3/uL (ref 0.0–0.2)
Basos: 0 %
EOS (ABSOLUTE): 0 10*3/uL (ref 0.0–0.4)
Eos: 0 %
Hematocrit: 29.3 % — ABNORMAL LOW (ref 34.0–46.6)
Hemoglobin: 9.8 g/dL — ABNORMAL LOW (ref 11.1–15.9)
Immature Grans (Abs): 0 10*3/uL (ref 0.0–0.1)
Immature Granulocytes: 0 %
Lymphocytes Absolute: 1.5 10*3/uL (ref 0.7–3.1)
Lymphs: 31 %
MCH: 24 pg — ABNORMAL LOW (ref 26.6–33.0)
MCHC: 33.4 g/dL (ref 31.5–35.7)
MCV: 72 fL — ABNORMAL LOW (ref 79–97)
Monocytes Absolute: 0.5 10*3/uL (ref 0.1–0.9)
Monocytes: 11 %
Neutrophils Absolute: 2.7 10*3/uL (ref 1.4–7.0)
Neutrophils: 58 %
Platelets: 472 10*3/uL — ABNORMAL HIGH (ref 150–450)
RBC: 4.08 x10E6/uL (ref 3.77–5.28)
RDW: 19 % — ABNORMAL HIGH (ref 11.7–15.4)
WBC: 4.7 10*3/uL (ref 3.4–10.8)

## 2019-03-01 LAB — BASIC METABOLIC PANEL
BUN/Creatinine Ratio: 15 (ref 12–28)
BUN: 11 mg/dL (ref 8–27)
CO2: 22 mmol/L (ref 20–29)
Calcium: 10.6 mg/dL — ABNORMAL HIGH (ref 8.7–10.3)
Chloride: 103 mmol/L (ref 96–106)
Creatinine, Ser: 0.75 mg/dL (ref 0.57–1.00)
GFR calc Af Amer: 86 mL/min/{1.73_m2} (ref >59)
GFR calc non Af Amer: 75 mL/min/{1.73_m2} (ref >59)
Glucose: 107 mg/dL — ABNORMAL HIGH (ref 65–99)
Potassium: 4.5 mmol/L (ref 3.5–5.2)
Sodium: 139 mmol/L (ref 134–144)

## 2019-03-01 NOTE — Patient Instructions (Addendum)
Medication Instructions:  Your physician recommends that you continue on your current medications as directed. Please refer to the Current Medication list given to you today.  Labwork: You will get lab work today:  BMP and CBC  Testing/Procedures: None ordered.  Follow-Up:  You will follow up with the [facility] 10-14 days after your procedure.  You will follow up with Dr. Lovena Le 91 days after your procedure.   Any Other Special Instructions Will Be Listed Below (If Applicable).  If you need a refill on your cardiac medications before your next appointment, please call your pharmacy.    Pacemaker Implantation, Adult Pacemaker implantation is a procedure to place a pacemaker inside your chest. A pacemaker is a small computer that sends electrical signals to the heart and helps your heart beat normally. A pacemaker also stores information about your heart rhythms. You may need pacemaker implantation if you:  Have a slow heartbeat (bradycardia).  Faint (syncope).  Have shortness of breath (dyspnea) due to heart problems. The pacemaker attaches to your heart through a wire, called a lead. Sometimes just one lead is needed. Other times, there will be two leads. There are two types of pacemakers:  Transvenous pacemaker. This type is placed under the skin or muscle of your chest. The lead goes through a vein in the chest area to reach the inside of the heart.  Epicardial pacemaker. This type is placed under the skin or muscle of your chest or belly. The lead goes through your chest to the outside of the heart. Tell a health care provider about:  Any allergies you have.  All medicines you are taking, including vitamins, herbs, eye drops, creams, and over-the-counter medicines.  Any problems you or family members have had with anesthetic medicines.  Any blood or bone disorders you have.  Any surgeries you have had.  Any medical conditions you have.  Whether you are  pregnant or may be pregnant. What are the risks? Generally, this is a safe procedure. However, problems may occur, including:  Infection.  Bleeding.  Failure of the pacemaker or the lead.  Collapse of a lung or bleeding into a lung.  Blood clot inside a blood vessel with a lead.  Damage to the heart.  Infection inside the heart (endocarditis).  Allergic reactions to medicines. What happens before the procedure? Staying hydrated Follow instructions from your health care provider about hydration, which may include:  Up to 2 hours before the procedure - you may continue to drink clear liquids, such as water, clear fruit juice, black coffee, and plain tea. Eating and drinking restrictions Follow instructions from your health care provider about eating and drinking, which may include:  8 hours before the procedure - stop eating heavy meals or foods such as meat, fried foods, or fatty foods.  6 hours before the procedure - stop eating light meals or foods, such as toast or cereal.  6 hours before the procedure - stop drinking milk or drinks that contain milk.  2 hours before the procedure - stop drinking clear liquids. Medicines  Ask your health care provider about: ? Changing or stopping your regular medicines. This is especially important if you are taking diabetes medicines or blood thinners. ? Taking medicines such as aspirin and ibuprofen. These medicines can thin your blood. Do not take these medicines before your procedure if your health care provider instructs you not to.  You may be given antibiotic medicine to help prevent infection. General instructions  You will have a heart evaluation. This may include an electrocardiogram (ECG), chest X-ray, and heart imaging (echocardiogram,  or echo) tests.  You will have blood tests.  Do not use any products that contain nicotine or tobacco, such as cigarettes and e-cigarettes. If you need help quitting, ask your health care  provider.  Plan to have someone take you home from the hospital or clinic.  If you will be going home right after the procedure, plan to have someone with you for 24 hours.  Ask your health care provider how your surgical site will be marked or identified. What happens during the procedure?  To reduce your risk of infection: ? Your health care team will wash or sanitize their hands. ? Your skin will be washed with soap. ? Hair may be removed from the surgical area.  An IV tube will be inserted into one of your veins.  You will be given one or more of the following: ? A medicine to help you relax (sedative). ? A medicine to numb the area (local anesthetic). ? A medicine to make you fall asleep (general anesthetic).  If you are getting a transvenous pacemaker: ? An incision will be made in your upper chest. ? A pocket will be made for the pacemaker. It may be placed under the skin or between layers of muscle. ? The lead will be inserted into a blood vessel that returns to the heart. ? While X-rays are taken by an imaging machine (fluoroscopy), the lead will be advanced through the vein to the inside of your heart. ? The other end of the lead will be tunneled under the skin and attached to the pacemaker.  If you are getting an epicardial pacemaker: ? An incision will be made near your ribs or breastbone (sternum) for the lead. ? The lead will be attached to the outside of your heart. ? Another incision will be made in your chest or upper belly to create a pocket for the pacemaker. ? The free end of the lead will be tunneled under the skin and attached to the pacemaker.  The transvenous or epicardial pacemaker will be tested. Imaging studies may be done to check the lead position.  The incisions will be closed with stitches (sutures), adhesive strips, or skin glue.  Bandages (dressing) will be placed over the incisions. The procedure may vary among health care providers and  hospitals. What happens after the procedure?  Your blood pressure, heart rate, breathing rate, and blood oxygen level will be monitored until the medicines you were given have worn off.  You will be given antibiotics and pain medicine.  ECG and chest x-rays will be done.  You will wear a continuous type of ECG (Holter monitor) to check your heart rhythm.  Your health care provider will program the pacemaker.  Do not drive for 24 hours if you received a sedative. This information is not intended to replace advice given to you by your health care provider. Make sure you discuss any questions you have with your health care provider. Document Released: 05/22/2002 Document Revised: 02/18/2018 Document Reviewed: 11/13/2015 Elsevier Patient Education  2020 [employer].

## 2019-03-01 NOTE — Progress Notes (Signed)
HPI Shannon Ortiz is referred today by PA -C Bhavinkumar for evaluation of symptomatic tachy-brady syndrome. She has both atrial fib with a RVR and post termination pauses of up to 7 seconds. She has had dizzy spells. She has not passed out.  Allergies  Allergen Reactions  . Elavil [Amitriptyline Hcl] Hives  . Ketek [Telithromycin] Hives     Current Outpatient Medications  Medication Sig Dispense Refill  . amLODipine (NORVASC) 5 MG tablet Take 5 mg by mouth daily.    Marland Kitchen aspirin EC 81 MG EC tablet Take 1 tablet (81 mg total) by mouth daily.    . famotidine (PEPCID) 40 MG tablet Take 20 mg by mouth daily.     . feeding supplement (ENSURE SURGERY) LIQD Take 237 mLs by mouth 2 (two) times daily between meals. 60 Bottle 0  . losartan (COZAAR) 100 MG tablet Take 100 mg by mouth daily.    . Multiple Vitamins-Minerals (MULTIVITAMIN ADULT) CHEW Chew 1 tablet by mouth daily.     . ondansetron (ZOFRAN) 4 MG tablet Take 1 tablet (4 mg total) by mouth every 8 (eight) hours as needed for nausea. 8 tablet 5  . pantoprazole (PROTONIX) 40 MG tablet Take 40 mg by mouth every other day.      No current facility-administered medications for this visit.      Past Medical History:  Diagnosis Date  . Acquired diverticulum of distal esophagus with dysphagia 10/03/2018  . AKI (acute kidney injury) ([place]) 08/31/2018  . Anxiety   . Bradycardia 02/16/2014  . Chest tightness   . DDD (degenerative disc disease), lumbar   . DJD (degenerative joint disease)   . Dry eyes   . Esophageal stenosis 08/31/2018  . Facial pain   . Fibromyalgia   . Hemorrhoid   . Hypertension   . Hypokalemia 08/31/2018  . Hypothyroidism   . Lightheadedness   . Near syncope 02/16/2014  . Osteopenia   . Sigmoid diverticulosis   . Thyroid nodule    PT DENIES    ROS:   All systems reviewed and negative except as noted in the HPI.   Past Surgical History:  Procedure Laterality Date  . ABDOMINAL HYSTERECTOMY     COMPLETE  .  BALLOON DILATION N/A 09/01/2018   Procedure: BALLOON DILATION;  Surgeon: Ronnette Juniper, MD;  Location: [place];  Service: Gastroenterology;  Laterality: N/A;  . BIOPSY  09/01/2018   Procedure: BIOPSY;  Surgeon: Ronnette Juniper, MD;  Location: [facility] ENDOSCOPY;  Service: Gastroenterology;;  . Lum Keas INJECTION  09/01/2018   Procedure: BOTOX INJECTION;  Surgeon: Ronnette Juniper, MD;  Location: [place];  Service: Gastroenterology;;  . BREAST SURGERY     breast biopsy  . CHOLECYSTECTOMY    . ESOPHAGEAL MANOMETRY N/A 10/07/2018   Procedure: ESOPHAGEAL MANOMETRY (EM);  Surgeon: Ronnette Juniper, MD;  Location: [facility];  Service: Gastroenterology;  Laterality: N/A;  . ESOPHAGOGASTRODUODENOSCOPY N/A 10/25/2018   Procedure: ESOPHAGOGASTRODUODENOSCOPY (EGD);  Surgeon: Michael Boston, MD;  Location: [facility];  Service: General;  Laterality: N/A;  . ESOPHAGOGASTRODUODENOSCOPY (EGD) WITH PROPOFOL N/A 09/01/2018   Procedure: ESOPHAGOGASTRODUODENOSCOPY (EGD) WITH PROPOFOL;  Surgeon: Ronnette Juniper, MD;  Location: [place];  Service: Gastroenterology;  Laterality: N/A;     Family History  Problem Relation Age of Onset  . Hypertension Mother   . Diabetes Mother        non-insulin dependant  . CAD Brother      Social History   Socioeconomic History  . Marital  status: Widowed    Spouse name: Not on file  . Number of children: Not on file  . Years of education: Not on file  . Highest education level: Not on file  Occupational History  . Not on file  Social Needs  . Financial resource strain: Not on file  . Food insecurity    Worry: Not on file    Inability: Not on file  . Transportation needs    Medical: Not on file    Non-medical: Not on file  Tobacco Use  . Smoking status: Never Smoker  . Smokeless tobacco: Never Used  Substance and Sexual Activity  . Alcohol use: No  . Drug use: No  . Sexual activity: Never  Lifestyle  . Physical activity    Days per week: Not on file    Minutes per session:  Not on file  . Stress: Not on file  Relationships  . Social Herbalist on phone: Not on file    Gets together: Not on file    Attends religious service: Not on file    Active member of club or organization: Not on file    Attends meetings of clubs or organizations: Not on file    Relationship status: Not on file  . Intimate partner violence    Fear of current or ex partner: Not on file    Emotionally abused: Not on file    Physically abused: Not on file    Forced sexual activity: Not on file  Other Topics Concern  . Not on file  Social History Narrative  . Not on file     BP (!) 150/88   Pulse 76   Ht 5\' 7"  (1.702 m)   Wt 145 lb (65.8 kg)   SpO2 98%   BMI 22.71 kg/m   Physical Exam:  Elderly appearing 83 yo woman, NAD HEENT: Unremarkable Neck:  No JVD, no thyromegally Lymphatics:  No adenopathy Back:  No CVA tenderness Lungs:  Clear with no wheezes HEART:  IRegular rate rhythm, no murmurs, no rubs, no clicks Abd:  soft, positive bowel sounds, no organomegally, no rebound, no guarding Ext:  2 plus pulses, no edema, no cyanosis, no clubbing Skin:  No rashes no nodules Neuro:  CN II through XII intact, motor grossly intact  EKG - NSR with PVC's  Assess/Plan: 1. Symptomatic sinus node dysfunction - she has had post termination pauses of 7 seconds during the waking hours. I have discussed the treatment options and recommended PPM insertion.  2. PAF - once her PPM is in place, we will plan to uptitrate her medical therapy. 3. HTN - her SBP is up today. We will consider adding a beta blocker.   Mikle Bosworth.D.

## 2019-03-01 NOTE — H&P (View-Only) (Signed)
HPI Shannon Ortiz is referred today by PA -C Bhavinkumar for evaluation of symptomatic tachy-brady syndrome. She has both atrial fib with a RVR and post termination pauses of up to 7 seconds. She has had dizzy spells. She has not passed out.  Allergies  Allergen Reactions  . Elavil [Amitriptyline Hcl] Hives  . Ketek [Telithromycin] Hives     Current Outpatient Medications  Medication Sig Dispense Refill  . amLODipine (NORVASC) 5 MG tablet Take 5 mg by mouth daily.    Marland Kitchen aspirin EC 81 MG EC tablet Take 1 tablet (81 mg total) by mouth daily.    . famotidine (PEPCID) 40 MG tablet Take 20 mg by mouth daily.     . feeding supplement (ENSURE SURGERY) LIQD Take 237 mLs by mouth 2 (two) times daily between meals. 60 Bottle 0  . losartan (COZAAR) 100 MG tablet Take 100 mg by mouth daily.    . Multiple Vitamins-Minerals (MULTIVITAMIN ADULT) CHEW Chew 1 tablet by mouth daily.     . ondansetron (ZOFRAN) 4 MG tablet Take 1 tablet (4 mg total) by mouth every 8 (eight) hours as needed for nausea. 8 tablet 5  . pantoprazole (PROTONIX) 40 MG tablet Take 40 mg by mouth every other day.      No current facility-administered medications for this visit.      Past Medical History:  Diagnosis Date  . Acquired diverticulum of distal esophagus with dysphagia 10/03/2018  . AKI (acute kidney injury) ([place]) 08/31/2018  . Anxiety   . Bradycardia 02/16/2014  . Chest tightness   . DDD (degenerative disc disease), lumbar   . DJD (degenerative joint disease)   . Dry eyes   . Esophageal stenosis 08/31/2018  . Facial pain   . Fibromyalgia   . Hemorrhoid   . Hypertension   . Hypokalemia 08/31/2018  . Hypothyroidism   . Lightheadedness   . Near syncope 02/16/2014  . Osteopenia   . Sigmoid diverticulosis   . Thyroid nodule    PT DENIES    ROS:   All systems reviewed and negative except as noted in the HPI.   Past Surgical History:  Procedure Laterality Date  . ABDOMINAL HYSTERECTOMY     COMPLETE  .  BALLOON DILATION N/A 09/01/2018   Procedure: BALLOON DILATION;  Surgeon: Ronnette Juniper, MD;  Location: [place];  Service: Gastroenterology;  Laterality: N/A;  . BIOPSY  09/01/2018   Procedure: BIOPSY;  Surgeon: Ronnette Juniper, MD;  Location: [facility] ENDOSCOPY;  Service: Gastroenterology;;  . Lum Keas INJECTION  09/01/2018   Procedure: BOTOX INJECTION;  Surgeon: Ronnette Juniper, MD;  Location: [place];  Service: Gastroenterology;;  . BREAST SURGERY     breast biopsy  . CHOLECYSTECTOMY    . ESOPHAGEAL MANOMETRY N/A 10/07/2018   Procedure: ESOPHAGEAL MANOMETRY (EM);  Surgeon: Ronnette Juniper, MD;  Location: [facility] ENDOSCOPY;  Service: Gastroenterology;  Laterality: N/A;  . ESOPHAGOGASTRODUODENOSCOPY N/A 10/25/2018   Procedure: ESOPHAGOGASTRODUODENOSCOPY (EGD);  Surgeon: Michael Boston, MD;  Location: [facility] ORS;  Service: General;  Laterality: N/A;  . ESOPHAGOGASTRODUODENOSCOPY (EGD) WITH PROPOFOL N/A 09/01/2018   Procedure: ESOPHAGOGASTRODUODENOSCOPY (EGD) WITH PROPOFOL;  Surgeon: Ronnette Juniper, MD;  Location: [place];  Service: Gastroenterology;  Laterality: N/A;     Family History  Problem Relation Age of Onset  . Hypertension Mother   . Diabetes Mother        non-insulin dependant  . CAD Brother      Social History   Socioeconomic History  . Marital  status: Widowed    Spouse name: Not on file  . Number of children: Not on file  . Years of education: Not on file  . Highest education level: Not on file  Occupational History  . Not on file  Social Needs  . Financial resource strain: Not on file  . Food insecurity    Worry: Not on file    Inability: Not on file  . Transportation needs    Medical: Not on file    Non-medical: Not on file  Tobacco Use  . Smoking status: Never Smoker  . Smokeless tobacco: Never Used  Substance and Sexual Activity  . Alcohol use: No  . Drug use: No  . Sexual activity: Never  Lifestyle  . Physical activity    Days per week: Not on file    Minutes per session:  Not on file  . Stress: Not on file  Relationships  . Social Herbalist on phone: Not on file    Gets together: Not on file    Attends religious service: Not on file    Active member of club or organization: Not on file    Attends meetings of clubs or organizations: Not on file    Relationship status: Not on file  . Intimate partner violence    Fear of current or ex partner: Not on file    Emotionally abused: Not on file    Physically abused: Not on file    Forced sexual activity: Not on file  Other Topics Concern  . Not on file  Social History Narrative  . Not on file     BP (!) 150/88   Pulse 76   Ht 5\' 7"  (1.702 m)   Wt 145 lb (65.8 kg)   SpO2 98%   BMI 22.71 kg/m   Physical Exam:  Elderly appearing 83 yo woman, NAD HEENT: Unremarkable Neck:  No JVD, no thyromegally Lymphatics:  No adenopathy Back:  No CVA tenderness Lungs:  Clear with no wheezes HEART:  IRegular rate rhythm, no murmurs, no rubs, no clicks Abd:  soft, positive bowel sounds, no organomegally, no rebound, no guarding Ext:  2 plus pulses, no edema, no cyanosis, no clubbing Skin:  No rashes no nodules Neuro:  CN II through XII intact, motor grossly intact  EKG - NSR with PVC's  Assess/Plan: 1. Symptomatic sinus node dysfunction - she has had post termination pauses of 7 seconds during the waking hours. I have discussed the treatment options and recommended PPM insertion.  2. PAF - once her PPM is in place, we will plan to uptitrate her medical therapy. 3. HTN - her SBP is up today. We will consider adding a beta blocker.   Mikle Bosworth.D.

## 2019-03-02 ENCOUNTER — Other Ambulatory Visit (HOSPITAL_COMMUNITY)
Admission: RE | Admit: 2019-03-02 | Discharge: 2019-03-02 | Disposition: A | Discharge status: Home / Self Care | Payer: Medicare Other | Source: Ambulatory Visit | Attending: Internal Medicine | Admitting: Internal Medicine

## 2019-03-02 DIAGNOSIS — Z01812 Encounter for preprocedural laboratory examination: Secondary | ICD-10-CM | POA: Insufficient documentation

## 2019-03-02 DIAGNOSIS — Z20828 Contact with and (suspected) exposure to other viral communicable diseases: Secondary | ICD-10-CM | POA: Insufficient documentation

## 2019-03-03 ENCOUNTER — Other Ambulatory Visit (HOSPITAL_COMMUNITY): Payer: Medicare Other

## 2019-03-03 LAB — NOVEL CORONAVIRUS, NAA (HOSP ORDER, SEND-OUT TO REF LAB; TAT 18-24 HRS): SARS-CoV-2, NAA: NOT DETECTED

## 2019-03-06 ENCOUNTER — Other Ambulatory Visit: Payer: Self-pay

## 2019-03-06 ENCOUNTER — Encounter (HOSPITAL_COMMUNITY)
Admission: RE | Disposition: A | Discharge status: Home / Self Care | Payer: Medicare Other | Source: Home / Self Care | Attending: Internal Medicine

## 2019-03-06 ENCOUNTER — Ambulatory Visit (HOSPITAL_COMMUNITY)
Admission: RE | Admit: 2019-03-06 | Discharge: 2019-03-07 | Disposition: A | Discharge status: Home / Self Care | Payer: Medicare Other | Attending: Internal Medicine | Admitting: Internal Medicine

## 2019-03-06 DIAGNOSIS — Z7982 Long term (current) use of aspirin: Secondary | ICD-10-CM | POA: Diagnosis not present

## 2019-03-06 DIAGNOSIS — E039 Hypothyroidism, unspecified: Secondary | ICD-10-CM | POA: Diagnosis not present

## 2019-03-06 DIAGNOSIS — F419 Anxiety disorder, unspecified: Secondary | ICD-10-CM | POA: Insufficient documentation

## 2019-03-06 DIAGNOSIS — Z8249 Family history of ischemic heart disease and other diseases of the circulatory system: Secondary | ICD-10-CM | POA: Diagnosis not present

## 2019-03-06 DIAGNOSIS — Z950 Presence of cardiac pacemaker: Secondary | ICD-10-CM

## 2019-03-06 DIAGNOSIS — I480 Paroxysmal atrial fibrillation: Secondary | ICD-10-CM | POA: Diagnosis not present

## 2019-03-06 DIAGNOSIS — Z79899 Other long term (current) drug therapy: Secondary | ICD-10-CM | POA: Diagnosis not present

## 2019-03-06 DIAGNOSIS — M8580 Other specified disorders of bone density and structure, unspecified site: Secondary | ICD-10-CM | POA: Diagnosis not present

## 2019-03-06 DIAGNOSIS — I10 Essential (primary) hypertension: Secondary | ICD-10-CM | POA: Diagnosis not present

## 2019-03-06 DIAGNOSIS — I495 Sick sinus syndrome: Secondary | ICD-10-CM | POA: Diagnosis present

## 2019-03-06 DIAGNOSIS — M1990 Unspecified osteoarthritis, unspecified site: Secondary | ICD-10-CM | POA: Insufficient documentation

## 2019-03-06 DIAGNOSIS — M797 Fibromyalgia: Secondary | ICD-10-CM | POA: Insufficient documentation

## 2019-03-06 HISTORY — PX: PACEMAKER IMPLANT: EP1218

## 2019-03-06 LAB — SURGICAL PCR SCREEN
MRSA, PCR: NEGATIVE
Staphylococcus aureus: NEGATIVE

## 2019-03-06 SURGERY — PACEMAKER IMPLANT

## 2019-03-06 MED ORDER — SODIUM CHLORIDE 0.9 % IV SOLN
INTRAVENOUS | Status: DC
  Administered 2019-03-06 (×2): via INTRAVENOUS

## 2019-03-06 MED ORDER — ACETAMINOPHEN 325 MG PO TABS
325.0000 mg | ORAL_TABLET | ORAL | Status: DC | PRN
  Filled 2019-03-06: qty 2

## 2019-03-06 MED ORDER — MIDAZOLAM HCL 5 MG/5ML IJ SOLN
INTRAMUSCULAR | Status: DC | PRN
  Administered 2019-03-06: 1 mg via INTRAVENOUS

## 2019-03-06 MED ORDER — SODIUM CHLORIDE 0.9 % IV SOLN
INTRAVENOUS | Status: AC
  Filled 2019-03-06: qty 2

## 2019-03-06 MED ORDER — FENTANYL CITRATE (PF) 100 MCG/2ML IJ SOLN
INTRAMUSCULAR | Status: DC | PRN
  Administered 2019-03-06: 12.5 ug via INTRAVENOUS

## 2019-03-06 MED ORDER — MUPIROCIN 2 % EX OINT
TOPICAL_OINTMENT | CUTANEOUS | Status: AC
  Administered 2019-03-06: 1 via TOPICAL
  Filled 2019-03-06: qty 22

## 2019-03-06 MED ORDER — IOHEXOL 350 MG/ML SOLN
INTRAVENOUS | Status: DC | PRN
  Administered 2019-03-06: 14:00:00 10 mL

## 2019-03-06 MED ORDER — MIDAZOLAM HCL 5 MG/5ML IJ SOLN
INTRAMUSCULAR | Status: AC
  Filled 2019-03-06: qty 5

## 2019-03-06 MED ORDER — MUPIROCIN 2 % EX OINT
1.0000 "application " | TOPICAL_OINTMENT | Freq: Once | CUTANEOUS | Status: AC
  Administered 2019-03-06: 13:00:00 1 via TOPICAL
  Filled 2019-03-06: qty 22

## 2019-03-06 MED ORDER — FAMOTIDINE 20 MG PO TABS
20.0000 mg | ORAL_TABLET | Freq: Every day | ORAL | Status: DC
  Administered 2019-03-07: 20 mg via ORAL
  Filled 2019-03-06 (×2): qty 1

## 2019-03-06 MED ORDER — FENTANYL CITRATE (PF) 100 MCG/2ML IJ SOLN
INTRAMUSCULAR | Status: AC
  Filled 2019-03-06: qty 2

## 2019-03-06 MED ORDER — LOSARTAN POTASSIUM 50 MG PO TABS
100.0000 mg | ORAL_TABLET | Freq: Every day | ORAL | Status: DC
  Administered 2019-03-07: 100 mg via ORAL
  Filled 2019-03-06 (×2): qty 2

## 2019-03-06 MED ORDER — LIDOCAINE HCL (PF) 1 % IJ SOLN
INTRAMUSCULAR | Status: DC | PRN
  Administered 2019-03-06: 45 mL

## 2019-03-06 MED ORDER — SODIUM CHLORIDE 0.9 % IV SOLN
80.0000 mg | INTRAVENOUS | Status: AC
  Administered 2019-03-06: 14:00:00 80 mg
  Filled 2019-03-06: qty 2

## 2019-03-06 MED ORDER — ADULT MULTIVITAMIN W/MINERALS CH
1.0000 | ORAL_TABLET | ORAL | Status: DC
  Filled 2019-03-06: qty 1

## 2019-03-06 MED ORDER — ONDANSETRON HCL 4 MG/2ML IJ SOLN: 4.0000 mg | Freq: Four times a day (QID) | INTRAMUSCULAR | Status: DC | PRN

## 2019-03-06 MED ORDER — CEFAZOLIN SODIUM-DEXTROSE 1-4 GM/50ML-% IV SOLN
1.0000 g | Freq: Four times a day (QID) | INTRAVENOUS | Status: AC
  Administered 2019-03-06 – 2019-03-07 (×3): 1 g via INTRAVENOUS
  Filled 2019-03-06 (×3): qty 50

## 2019-03-06 MED ORDER — CEFAZOLIN SODIUM-DEXTROSE 2-4 GM/100ML-% IV SOLN
INTRAVENOUS | Status: AC
  Filled 2019-03-06: qty 100

## 2019-03-06 MED ORDER — SODIUM CHLORIDE 0.9 % IV SOLN
INTRAVENOUS | Status: DC | PRN
  Administered 2019-03-06 – 2019-03-07 (×3): 250 mL via INTRAVENOUS

## 2019-03-06 MED ORDER — CEFAZOLIN SODIUM-DEXTROSE 2-4 GM/100ML-% IV SOLN
2.0000 g | INTRAVENOUS | Status: AC
  Administered 2019-03-06: 2 g via INTRAVENOUS
  Filled 2019-03-06: qty 100

## 2019-03-06 MED ORDER — AMLODIPINE BESYLATE 5 MG PO TABS
5.0000 mg | ORAL_TABLET | Freq: Every day | ORAL | Status: DC
  Administered 2019-03-07: 5 mg via ORAL
  Filled 2019-03-06 (×2): qty 1

## 2019-03-06 MED ORDER — CHLORHEXIDINE GLUCONATE 4 % EX LIQD
60.0000 mL | Freq: Once | CUTANEOUS | Status: DC
  Filled 2019-03-06: qty 60

## 2019-03-06 MED ORDER — LIDOCAINE HCL (PF) 1 % IJ SOLN
INTRAMUSCULAR | Status: AC
  Filled 2019-03-06: qty 60

## 2019-03-06 MED ORDER — HEPARIN (PORCINE) IN NACL 1000-0.9 UT/500ML-% IV SOLN
INTRAVENOUS | Status: AC
  Filled 2019-03-06: qty 500

## 2019-03-06 MED ORDER — HEPARIN (PORCINE) IN NACL 1000-0.9 UT/500ML-% IV SOLN
INTRAVENOUS | Status: DC | PRN
  Administered 2019-03-06: 500 mL

## 2019-03-06 SURGICAL SUPPLY — 7 items
CABLE SURGICAL S-101-97-12 (CABLE) ×3 IMPLANT
LEAD TENDRIL MRI 52CM LPA1200M (Lead) ×2 IMPLANT
LEAD TENDRIL MRI 58CM LPA1200M (Lead) ×2 IMPLANT
PACEMAKER ASSURITY DR-RF (Pacemaker) ×2 IMPLANT
PAD PRO RADIOLUCENT 2001M-C (PAD) ×3 IMPLANT
SHEATH 8FR PRELUDE SNAP 13 (SHEATH) ×4 IMPLANT
TRAY PACEMAKER INSERTION (PACKS) ×3 IMPLANT

## 2019-03-06 NOTE — Discharge Instructions (Signed)
After Your Pacemaker   You have a St. Jude Pacemaker   Do not lift your arm above shoulder height for 1 week after your procedure. After 7 days, you may progress as below.     Monday March 13, 2019  Tuesday March 14, 2019 Wednesday March 15, 2019 Thursday March 16, 2019    Do not lift, push, pull, or carry anything over 10 pounds with the affected arm until 6 weeks (Monday April 17, 2019) after your procedure.    Do not drive until your wound check, or until instructed by your healthcare provider that you are safe to do so.    Monitor your pacemaker site for redness, swelling, and drainage. Call the device clinic at [phone] if you experience these symptoms or fever/chills.   If your incision is sealed with Steri-strips or staples. You may shower 7 days after your procedure and wash your incision with soap and water as long as it is healed. If your incision is closed with Dermabond/Surgical glue. You may shower 1 day after your pacemaker implant and wash your incision with soap and water. Avoid lotions, ointments, or perfumes over your incision until it is well-healed.   You may use a hot tub or a pool AFTER your wound check appointment if the incision is completely closed.   Your Pacemaker may be MRI compatible. We will discuss this at your first follow up/wound check. .    Remote monitoring is used to monitor your pacemaker from home. This monitoring is scheduled every 91 days by our office. It allows [place] to keep an eye on the functioning of your device to ensure it is working properly. You will routinely see your Electrophysiologist annually (more often if necessary).    Pacemaker Implantation, Care After This sheet gives you information about how to care for yourself after your procedure. Your health care provider may also give you more specific instructions. If you have problems or questions, contact your health care provider. What can I expect after the  procedure? After the procedure, it is common to have:  Mild pain.  Slight bruising.  Some swelling over the incision.  A slight bump over the skin where the device was placed. Sometimes, it is possible to feel the device under the skin. This is normal.  You should received your Pacemaker ID card within 4-8 weeks. Follow these instructions at home: Medicines  Take over-the-counter and prescription medicines only as told by your health care provider.  If you were prescribed an antibiotic medicine, take it as told by your health care provider. Do not stop taking the antibiotic even if you start to feel better. Wound care     Do not remove the bandage on your chest until directed to do so by your health care provider.  After your bandage is removed, you may see pieces of tape called skin adhesive strips over the area where the cut was made (incision site). Let them fall off on their own.  Check the incision site every day to make sure it is not infected, bleeding, or starting to pull apart.  Do not use lotions or ointments near the incision site unless directed to do so.  Keep the incision area clean and dry for 7 days after the procedure or as directed by your health care provider. It takes several weeks for the incision site to completely heal.  Do not take baths, swim, or use a hot tub for 7-10 days or as otherwise directed by  your health care provider. Activity  Do not drive or use heavy machinery while taking prescription pain medicine.  Do not drive for 24 hours if you were given a medicine to help you relax (sedative).  Check with your health care provider before you start to drive or play sports.  Avoid sudden jerking, pulling, or chopping movements that pull your upper arm far away from your body. Avoid these movements for at least 6 weeks or as long as told by your health care provider.  Do not lift your upper arm above your shoulders for at least 6 weeks or as long  as told by your health care provider. This means no tennis, golf, or swimming.  You may go back to work when your health care provider says it is okay. Pacemaker care  You may be shown how to transfer data from your pacemaker through the phone to your health care provider.  Always let all health care providers know about your pacemaker before you have any medical procedures or tests.  Wear a medical ID bracelet or necklace stating that you have a pacemaker. Carry a pacemaker ID card with you at all times.  Your pacemaker battery will last for 5-15 years. Routine checks by your health care provider will let the health care provider know when the battery is starting to run down. The pacemaker will need to be replaced when the battery starts to run down.  Do not use amateur Chief of Staff. Other electrical devices are safe to use, including power tools, lawn mowers, and speakers. If you are unsure of whether something is safe to use, ask your health care provider.  When using your cell phone, hold it to the ear opposite the pacemaker. Do not leave your cell phone in a pocket over the pacemaker.  Avoid places or objects that have a strong electric or magnetic field, including: ? Airport Herbalist. When at the airport, let officials know that you have a pacemaker. ? Power plants. ? Large electrical generators. ? Radiofrequency transmission towers, such as cell phone and radio towers. General instructions  Weigh yourself every day. If you suddenly gain weight, fluid may be building up in your body.  Keep all follow-up visits as told by your health care provider. This is important. Contact a health care provider if:  You gain weight suddenly.  Your legs or feet swell.  It feels like your heart is fluttering or skipping beats (heart palpitations).  You have chills or a fever.  You have more redness, swelling, or pain around your incisions.  You have  more fluid or blood coming from your incisions.  Your incisions feel warm to the touch.  You have pus or a bad smell coming from your incisions. Get help right away if:  You have chest pain.  You have trouble breathing or are short of breath.  You become extremely tired.  You are light-headed or you faint. This information is not intended to replace advice given to you by your health care provider. Make sure you discuss any questions you have with your health care provider.

## 2019-03-06 NOTE — Progress Notes (Signed)
Patient has been transferred to the Cath Lab and no longer needs IV team

## 2019-03-06 NOTE — Interval H&P Note (Signed)
History and Physical Interval Note:  03/06/2019 12:39 PM  Shannon Ortiz  has presented today for surgery, with the diagnosis of bradycardia.  The various methods of treatment have been discussed with the patient and family. After consideration of risks, benefits and other options for treatment, the patient has consented to  Procedure(s): PACEMAKER IMPLANT (N/A) as a surgical intervention.  The patient's history has been reviewed, patient examined, no change in status, stable for surgery.  I have reviewed the patient's chart and labs.  Questions were answered to the patient's satisfaction.     Cristopher Peru

## 2019-03-06 NOTE — Discharge Summary (Addendum)
ELECTROPHYSIOLOGY PROCEDURE DISCHARGE SUMMARY    Patient ID: Shannon Ortiz,  MRN: [id number], DOB/AGE: Nov 29, 1935 83 y.o.  Admit date: 03/06/2019 Discharge date: 03/07/2019  Primary Care Physician: Lawerance Cruel, MD  Primary Cardiologist: Jenkins Rouge, MD  Electrophysiologist: Dr. Lovena Le  Primary Discharge Diagnosis:  Tachy-brady syndrome   Secondary Discharge Diagnosis:  1. Paroxysmal AFib, new diagnosis     CHA2DS2Vasc is 4 2. HTN 3. Hypothyroidism   Allergies  Allergen Reactions  . Elavil [Amitriptyline Hcl] Hives  . Ketek [Telithromycin] Hives     Procedures This Admission:  1.  Implantation of a St. Jude dual chamber PPM on 03/06/2019 by Dr. Lovena Le.  The patient received St. jude (serial number [id number]) pacemaker, St. Jude (serial number [id number]) right atrial lead and a St. Jude (serial number [id number]) right ventricular lead  There were no immediate post procedure complications. 2.  CXR on 03/07/2019 demonstrated no pneumothorax status post device implantation.   Brief HPI: Shannon Ortiz is a 83 y.o. female was referred to electrophysiology in the outpatient setting for consideration of PPM implantation.  Past medical history includes above.  The patient has had symptomatic bradycardia without reversible causes identified.  Risks, benefits, and alternatives to PPM implantation were reviewed with the patient who wished to proceed.   Hospital Course:  The patient was admitted and underwent implantation of a St. Jude dual chamber PPM with details as outlined above.  She was monitored on telemetry overnight which demonstrated APace-V sensed rhythm.  Left chest was without hematoma or ecchymosis.  The device was interrogated and found to be functioning normally.  CXR was obtained and demonstrated no pneumothorax status post device implantation.  Wound care, arm mobility, and restrictions were reviewed with the patient.  The patient feels well, no CP or SOB, she was  examined by Dr. Lovena Le and considered stable for discharge to home.    Paroxysmal AFib is a new finding for the patient.  Dr. Lovena Le (and I) discussed with her AFib/embolic/stroke risk and recommendation for anticoagulation.  Will start Eliquis 5mg  BID (appropriate for weight/Creat) She has not had any recent surgeries.  She is anemic, this looks somewhat chronic since her surgery in may though on the upswing.  She denies any h/o bleeding or hematological history.    Physical Exam: Vitals:   03/07/19 0412 03/07/19 0823 03/07/19 0823 03/07/19 0849  BP: (!) 154/80 121/77 121/77 120/71  Pulse: (!) 59  73 72  Resp: 18  18 16   Temp: (!) 97.5 F (36.4 C)  99.1 F (37.3 C) 98.2 F (36.8 C)  TempSrc: Oral  Oral Oral  SpO2: 99%  100% 100%  Weight:      Height:        GEN- The patient is well appearing, alert and oriented x 3 today.   HEENT: normocephalic, atraumatic; sclera clear, conjunctiva pink; hearing intact; oropharynx clear; neck supple, no JVP Lymph- no cervical lymphadenopathy Lungs- CTA b/l, normal work of breathing.  No wheezes, rales, rhonchi Heart- RRR, no murmurs, rubs or gallops, PMI not laterally displaced GI- soft, non-tender, non-distended, bowel sounds present Extremities- no clubbing, cyanosis, or edema MS- no significant deformity or atrophy Skin- warm and dry, no rash or lesion, left chest without hematoma/ecchymosis Psych- euthymic mood, full affect Neuro- strength and sensation are intact   Labs:   Lab Results  Component Value Date   WBC 4.7 03/01/2019   HGB 9.8 (L) 03/01/2019   HCT 29.3 (L) 03/01/2019  MCV 72 (L) 03/01/2019   PLT 472 (H) 03/01/2019    Recent Labs  Lab 03/01/19 1130  NA 139  K 4.5  CL 103  CO2 22  BUN 11  CREATININE 0.75  CALCIUM 10.6*  GLUCOSE 107*    Discharge Medications:  Allergies as of 03/07/2019      Reactions   Elavil [amitriptyline Hcl] Hives   Ketek [telithromycin] Hives      Medication List    STOP taking  these medications   aspirin 81 MG EC tablet     TAKE these medications   amLODipine 5 MG tablet Commonly known as: NORVASC Take 5 mg by mouth daily.   apixaban 5 MG Tabs tablet Commonly known as: Eliquis Take 1 tablet (5 mg total) by mouth 2 (two) times daily. Notes to patient: DO NOT start until Sunday 03/05/2019   famotidine 40 MG tablet Commonly known as: PEPCID Take 20 mg by mouth daily.   feeding supplement Liqd Take 237 mLs by mouth 2 (two) times daily between meals. What changed:   how much to take  when to take this   fexofenadine 180 MG tablet Commonly known as: ALLEGRA Take 180 mg by mouth daily as needed for allergies or rhinitis.   losartan 100 MG tablet Commonly known as: COZAAR Take 100 mg by mouth daily.   Multivitamin Adult Chew Chew 1 tablet by mouth 3 (three) times a week.   ondansetron 4 MG tablet Commonly known as: ZOFRAN Take 1 tablet (4 mg total) by mouth every 8 (eight) hours as needed for nausea.       Disposition:  Home  Discharge Instructions    Diet - low sodium heart healthy   Complete by: As directed    Increase activity slowly   Complete by: As directed      Follow-up Information    Evans Lance, MD Follow up.   Specialty: Cardiology Why: 06/22/2019 @ 1:45PM Contact information: [id number] [place] [address] [phone]        Baldwin Jamaica, PA-C Follow up.   Specialty: Cardiology Why: 03/21/2019 @ 1:45PM Contact information: [address] [place] [address] [phone]           Duration of Discharge Encounter: Greater than 30 minutes including physician time.  Signed, Baldwin Jamaica, PA-C  03/07/2019 9:38 AM   EP Attending  Patient seen and examined. Agree with the findings as noted above. The patient's PPM has been interrogated under my supervision and demonstrates normal DDD PM function. She is stable for DC home with the usual followup.  Mikle Bosworth.D.

## 2019-03-07 ENCOUNTER — Ambulatory Visit (HOSPITAL_COMMUNITY): Payer: Medicare Other

## 2019-03-07 ENCOUNTER — Other Ambulatory Visit: Payer: Self-pay

## 2019-03-07 ENCOUNTER — Encounter (HOSPITAL_COMMUNITY): Payer: Self-pay | Admitting: Internal Medicine

## 2019-03-07 DIAGNOSIS — E039 Hypothyroidism, unspecified: Secondary | ICD-10-CM | POA: Diagnosis not present

## 2019-03-07 DIAGNOSIS — I10 Essential (primary) hypertension: Secondary | ICD-10-CM | POA: Diagnosis not present

## 2019-03-07 DIAGNOSIS — I495 Sick sinus syndrome: Secondary | ICD-10-CM | POA: Diagnosis not present

## 2019-03-07 DIAGNOSIS — I480 Paroxysmal atrial fibrillation: Secondary | ICD-10-CM | POA: Diagnosis not present

## 2019-03-07 MED ORDER — APIXABAN 5 MG PO TABS: 5.0000 mg | ORAL_TABLET | Freq: Two times a day (BID) | ORAL | 6 refills | Status: DC

## 2019-03-07 NOTE — Plan of Care (Signed)
  Problem: Education: Goal: Knowledge of General Education information will improve Description Including pain rating scale, medication(s)/side effects and non-pharmacologic comfort measures Outcome: Adequate for Discharge   Problem: Health Behavior/Discharge Planning: Goal: Ability to manage health-related needs will improve Outcome: Adequate for Discharge   

## 2019-03-07 NOTE — Progress Notes (Signed)
Discharge and medication education given with teach back.  Pt's belongings with patient: clothes, shoes and cell phone.  Pt's grandson to come and pick up pt. Pt was transported to [facility] entrance in wheelchair.  Iv removed and dressing applied. Questions and concerns answered.

## 2019-03-21 ENCOUNTER — Ambulatory Visit: Payer: Medicare Other

## 2019-03-21 ENCOUNTER — Encounter: Payer: Self-pay | Admitting: Physician Assistant

## 2019-03-21 ENCOUNTER — Other Ambulatory Visit: Payer: Self-pay

## 2019-03-21 ENCOUNTER — Other Ambulatory Visit: Payer: Self-pay | Admitting: *Deleted

## 2019-03-21 ENCOUNTER — Ambulatory Visit (INDEPENDENT_AMBULATORY_CARE_PROVIDER_SITE_OTHER): Payer: Medicare Other | Admitting: Physician Assistant

## 2019-03-21 VITALS — BP 140/80 | HR 95 | Ht 66.0 in | Wt 141.0 lb

## 2019-03-21 DIAGNOSIS — I480 Paroxysmal atrial fibrillation: Secondary | ICD-10-CM | POA: Diagnosis not present

## 2019-03-21 DIAGNOSIS — Z950 Presence of cardiac pacemaker: Secondary | ICD-10-CM | POA: Diagnosis not present

## 2019-03-21 DIAGNOSIS — Z79899 Other long term (current) drug therapy: Secondary | ICD-10-CM | POA: Diagnosis not present

## 2019-03-21 DIAGNOSIS — Z5189 Encounter for other specified aftercare: Secondary | ICD-10-CM

## 2019-03-21 DIAGNOSIS — I495 Sick sinus syndrome: Secondary | ICD-10-CM | POA: Diagnosis not present

## 2019-03-21 DIAGNOSIS — R001 Bradycardia, unspecified: Secondary | ICD-10-CM | POA: Diagnosis not present

## 2019-03-21 DIAGNOSIS — I10 Essential (primary) hypertension: Secondary | ICD-10-CM

## 2019-03-21 MED ORDER — METOPROLOL SUCCINATE ER 25 MG PO TB24: 25.0000 mg | ORAL_TABLET | Freq: Every day | ORAL | 3 refills | Status: DC

## 2019-03-21 NOTE — Progress Notes (Signed)
Cardiology Office Note Date:  03/21/2019  Patient ID:  Shannon Ortiz, Shannon Ortiz 03-19-1936, MRN [id number] PCP:  Lawerance Cruel, MD  Cardiologist:  Dr. Johnsie Cancel Electrophysiologist: Dr. Lovena Le    Chief Complaint: s/p PPM  History of Present Illness: Shannon Ortiz is a 83 y.o. female with history of paroxysmal AFib, CKD (III), HTN, hypothyroidism, esophageal stenosis, s/p esophageal diverticulectomy and hiatal hernia repair 10/25/18, tachy-brady > PPM  She was referred to Dr. Lovena Le after wearing a monitor noting Afib w/RVR (new for the patient) and post termination pauses as long as 7 seconds, underwent PPM 03/06/2019, she comes today for post implant visit.  She is doing well post pacer.  No CP or SOB.  She will occassionally have some palpitations or an uneasy sensation, no CP or SOB, no weak spells, no near syncope or syncope.  No implant site concerns.  She is tolerating the Eliquis well without bleeding or signs of bleeding  Device information SJM dual chamber PPM, implanted 03/06/2019   Past Medical History:  Diagnosis Date  . Acquired diverticulum of distal esophagus with dysphagia 10/03/2018  . AKI (acute kidney injury) ([place]) 08/31/2018  . Anxiety   . Bradycardia 02/16/2014  . Chest tightness   . DDD (degenerative disc disease), lumbar   . DJD (degenerative joint disease)   . Dry eyes   . Esophageal stenosis 08/31/2018  . Facial pain   . Fibromyalgia   . Hemorrhoid   . Hypertension   . Hypokalemia 08/31/2018  . Hypothyroidism   . Lightheadedness   . Near syncope 02/16/2014  . Osteopenia   . Sigmoid diverticulosis   . Thyroid nodule    PT DENIES    Past Surgical History:  Procedure Laterality Date  . ABDOMINAL HYSTERECTOMY     COMPLETE  . BALLOON DILATION N/A 09/01/2018   Procedure: BALLOON DILATION;  Surgeon: Ronnette Juniper, MD;  Location: [place];  Service: Gastroenterology;  Laterality: N/A;  . BIOPSY  09/01/2018   Procedure: BIOPSY;  Surgeon: Ronnette Juniper, MD;   Location: [facility] ENDOSCOPY;  Service: Gastroenterology;;  . Lum Keas INJECTION  09/01/2018   Procedure: BOTOX INJECTION;  Surgeon: Ronnette Juniper, MD;  Location: [place];  Service: Gastroenterology;;  . BREAST SURGERY     breast biopsy  . CHOLECYSTECTOMY    . ESOPHAGEAL MANOMETRY N/A 10/07/2018   Procedure: ESOPHAGEAL MANOMETRY (EM);  Surgeon: Ronnette Juniper, MD;  Location: [facility] ENDOSCOPY;  Service: Gastroenterology;  Laterality: N/A;  . ESOPHAGOGASTRODUODENOSCOPY N/A 10/25/2018   Procedure: ESOPHAGOGASTRODUODENOSCOPY (EGD);  Surgeon: Michael Boston, MD;  Location: [facility];  Service: General;  Laterality: N/A;  . ESOPHAGOGASTRODUODENOSCOPY (EGD) WITH PROPOFOL N/A 09/01/2018   Procedure: ESOPHAGOGASTRODUODENOSCOPY (EGD) WITH PROPOFOL;  Surgeon: Ronnette Juniper, MD;  Location: [place];  Service: Gastroenterology;  Laterality: N/A;  . PACEMAKER IMPLANT N/A 03/06/2019   Procedure: PACEMAKER IMPLANT;  Surgeon: Evans Lance, MD;  Location: [place] [facility];  Service: Cardiovascular;  Laterality: N/A;    Current Outpatient Medications  Medication Sig Dispense Refill  . amLODipine (NORVASC) 5 MG tablet Take 5 mg by mouth daily.    Marland Kitchen apixaban (ELIQUIS) 5 MG TABS tablet Take 1 tablet (5 mg total) by mouth 2 (two) times daily. 60 tablet 6  . famotidine (PEPCID) 40 MG tablet Take 20 mg by mouth daily.     . feeding supplement (ENSURE SURGERY) LIQD Take 237 mLs by mouth 2 (two) times daily between meals. (Patient taking differently: Take 355.5 mLs by mouth daily. ) 60 Bottle  0  . fexofenadine (ALLEGRA) 180 MG tablet Take 180 mg by mouth daily as needed for allergies or rhinitis.    Marland Kitchen losartan (COZAAR) 100 MG tablet Take 100 mg by mouth daily.    . Multiple Vitamins-Minerals (MULTIVITAMIN ADULT) CHEW Chew 1 tablet by mouth 3 (three) times a week.     . ondansetron (ZOFRAN) 4 MG tablet Take 1 tablet (4 mg total) by mouth every 8 (eight) hours as needed for nausea. 8 tablet 5  . metoprolol succinate (TOPROL XL) 25 MG  24 hr tablet Take 1 tablet (25 mg total) by mouth daily. 90 tablet 3  . pantoprazole (PROTONIX) 20 MG tablet Take 20 mg by mouth daily.     No current facility-administered medications for this visit.     Allergies:   Elavil [amitriptyline hcl] and Ketek [telithromycin]   Social History:  The patient  reports that she has never smoked. She has never used smokeless tobacco. She reports that she does not drink alcohol or use drugs.   Family History:  The patient's family history includes CAD in her brother; Diabetes in her mother; Hypertension in her mother.  ROS:  Please see the history of present illness.    All other systems are reviewed and otherwise negative.   PHYSICAL EXAM:  VS:  BP 140/80   Pulse 95   Ht 5\' 6"  (1.676 m)   Wt 141 lb (64 kg)   BMI 22.76 kg/m  BMI: Body mass index is 22.76 kg/m. Well nourished, well developed, in no acute distress  HEENT: normocephalic, atraumatic  Neck: no JVD, carotid bruits or masses Cardiac:  RRR; no significant murmurs, no rubs, or gallops Lungs:  CTA b/l, no wheezing, rhonchi or rales  Abd: soft, nontender MS: no deformity, age appropriate atrophy Ext: no edema  Skin: warm and dry, no rash Neuro:  No gross deficits appreciated Psych: euthymic mood, full affect  PPM site: steri strips are removed without difficulty, wound edges are well approximated, no bleeding or drainage, no erythema, edema, or fluctuation.  No increased heat to the surrounding tissues.   EKG:  Not done today PPM interrogation done today and reviewed by myself:  Battery and lead measurements are good, acute implant lead outputs remain She presents in AFIb V rates 130's though does spontaneously convert to SR This episode looks to have begun just about a 1/2 or so prior to coming in  AMS 8 episode Burden <1%   12/27/2018: TTE IMPRESSIONS 1. The left ventricle has normal systolic function, with an ejection fraction of 55-60%. The cavity size was normal. There  is mildly increased left ventricular wall thickness. Left ventricular diastolic Doppler parameters are consistent with impaired  relaxation. No evidence of left ventricular regional wall motion abnormalities.  2. The right ventricle has normal systolic function. The cavity was normal. There is no increase in right ventricular wall thickness.  3. Left atrial size was moderately dilated.  4. The pericardial effusion is circumferential.  5. Trivial pericardial effusion is present.  6. Mild mitral valve prolapse.  7. The mitral valve is abnormal. Mild thickening of the mitral valve leaflet.  8. The tricuspid valve is grossly normal.  9. The aortic valve is tricuspid. Mild sclerosis of the aortic valve. No stenosis of the aortic valve.   SUMMARY LVEF 55-60%, mild LVH, normal wall motion, grade 1 DD, normal LV filling pressure, normal RV systolic function, moderate LAE, mild late systolic bileaflet prolapse of the mitral valve with trivial MR,  mild TR, RVSP 40 mmHg (mild pulmonary hypertension), normal IVC, trivial circumferential pericardial effusion  FINDINGS  Left Ventricle: The left ventricle has normal systolic function, with an ejection fraction of 55-60%. The cavity size was normal. There is mildly increased left ventricular wall thickness. Left ventricular diastolic Doppler parameters are consistent  with impaired relaxation. Normal left ventricular filling pressures No evidence of left ventricular regional wall motion abnormalities..  Right Ventricle: The right ventricle has normal systolic function. The cavity was normal. There is no increase in right ventricular wall thickness.  Left Atrium: Left atrial size was moderately dilated.  Right Atrium: Right atrial size was normal in size. Right atrial pressure is estimated at 10 mmHg.  Interatrial Septum: No atrial level shunt detected by color flow Doppler.  Pericardium: Trivial pericardial effusion is present. The pericardial  effusion is circumferential.  Mitral Valve: The mitral valve is abnormal. Mild thickening of the mitral valve leaflet. Mitral valve regurgitation is mild by color flow Doppler. There is mild late systolic prolapse of of both mitral leaflets of the mitral valve.  Tricuspid Valve: The tricuspid valve is grossly normal. Tricuspid valve regurgitation is mild by color flow Doppler.  Aortic Valve: The aortic valve is tricuspid Mild sclerosis of the aortic valve. Aortic valve regurgitation was not visualized by color flow Doppler. There is No stenosis of the aortic valve.  Pulmonic Valve: The pulmonic valve was grossly normal. Pulmonic valve regurgitation is trivial by color flow Doppler.  Venous: The inferior vena cava measures 1.55 cm, is normal in size with greater than 50% respiratory variability.   Recent Labs: 12/27/2018: ALT 7; Magnesium 1.8; TSH 0.821 03/01/2019: BUN 11; Creatinine, Ser 0.75; Hemoglobin 9.8; Platelets 472; Potassium 4.5; Sodium 139  No results found for requested labs within last 8760 hours.   Estimated Creatinine Clearance: 51.6 mL/min (by C-G formula based on SCr of 0.75 mg/dL).   Wt Readings from Last 3 Encounters:  03/21/19 141 lb (64 kg)  03/07/19 137 lb 12.8 oz (62.5 kg)  03/01/19 145 lb (65.8 kg)     Other studies reviewed: Additional studies/records reviewed today include: summarized above  ASSESSMENT AND PLAN:  1. PPM     Site looks good, no evidence of infection  2. Paroxysmal AFib     CHA2DS2Vasc is 4, on Eliquis, appropriately dosed     new to a/c will get H/H today  3. Tachy-brady     Start Toprol XL 25mg  daily to day  PAF today Discuss AFib clinic as a resource for visits/follow up, though she prefers not to, and mentions copay's for visits are financially tough for her Will get the toprol started, she is aware of her AFib and asked to let us know if she is burdened significantly byher palpitations  4. HTN     Looks OK, should tolerate  BB    Disposition: F/u with Q 3 mo remotes, Dr. Lovena Le as scheduled, sooner if needed  Current medicines are reviewed at length with the patient today.  The patient did not have any concerns regarding medicines.  Venetia Night, PA-C 03/21/2019 4:40 PM     [address] [place] [address] [phone] (office)  [phone] (fax)

## 2019-03-21 NOTE — Patient Instructions (Signed)
Medication Instructions:   START TAKING:   TOPROL XL 25 MG ONCE A DAY    If you need a refill on your cardiac medications before your next appointment, please call your pharmacy.   Lab work: H/H TODAY   If you have labs (blood work) drawn today and your tests are completely normal, you will receive your results only by: Marland Kitchen MyChart Message (if you have MyChart) OR . A paper copy in the mail If you have any lab test that is abnormal or we need to change your treatment, we will call you to review the results.  Testing/Procedures: NONE ORDERED  TODAY  Follow-Up: AS SCHEDULED    Any Other Special Instructions Will Be Listed Below (If Applicable).

## 2019-03-22 LAB — HEMOGLOBIN AND HEMATOCRIT, BLOOD
Hematocrit: 30.2 % — ABNORMAL LOW (ref 34.0–46.6)
Hemoglobin: 9.7 g/dL — ABNORMAL LOW (ref 11.1–15.9)

## 2019-05-23 ENCOUNTER — Telehealth: Payer: Self-pay | Admitting: Cardiovascular Disease

## 2019-05-23 NOTE — Progress Notes (Signed)
Date:  05/24/2019   ID:  Shannon Ortiz, DOB [birth year], MRN [id number]  Provider Location: Home  PCP:  Lawerance Cruel, MD  Cardiologist:  Jenkins Rouge, MD  Electrophysiologist:  None   Evaluation Performed:  Follow-Up Visit  History of Present Illness:    Shannon Ortiz is a 83 y.o. female with post hospitalization  She has a hx of Hypertension, CKD stage 3, hypothyroidism, esophageal stenosis, s/p esophageal diverticulectomy and hiatal hernia repair 10/25/18   Hospitalized 12/27/18 with weakness.  occ rapid HR.   K+ was 2.9 and was anemic with Hgb at 8.7   troponins mildly elevated at 221 to 202   CK neg.  .  EKG Sinus tachycardia, 101 bpm, LAD, PVC, Incomplete left bundle branch block  Not felt to be ACS and Echo with EF 55-60%, G1DD, mild late systolic bileaflet prolapse of MV with trivial MR, mild TR mild pulmonary HTN.    Subsequent monitor 02/28/19 with frequent episodes of PAF with > 7 second post termination pauses Had PPM placed 03/06/19 by Dr Percell Miller Jude dual chamber On eliquis for PAF CHADVASC 4   Started on Toprol 25 mg daily by PA 03/21/19   Doing well no cardiac symptoms Grand daughter with her today she is a sophomore at [employer]    The patient does not have symptoms concerning for COVID-19 infection (fever, chills, cough, or new shortness of breath).  She tested neg in hospital.    Past Medical History:  Diagnosis Date  . Acquired diverticulum of distal esophagus with dysphagia 10/03/2018  . AKI (acute kidney injury) ([place]) 08/31/2018  . Anxiety   . Bradycardia 02/16/2014  . Chest tightness   . DDD (degenerative disc disease), lumbar   . DJD (degenerative joint disease)   . Dry eyes   . Esophageal stenosis 08/31/2018  . Facial pain   . Fibromyalgia   . Hemorrhoid   . Hypertension   . Hypokalemia 08/31/2018  . Hypothyroidism   . Lightheadedness   . Near syncope 02/16/2014  . Osteopenia   . Sigmoid diverticulosis   . Thyroid nodule    PT DENIES   Past  Surgical History:  Procedure Laterality Date  . ABDOMINAL HYSTERECTOMY     COMPLETE  . BALLOON DILATION N/A 09/01/2018   Procedure: BALLOON DILATION;  Surgeon: Ronnette Juniper, MD;  Location: [place];  Service: Gastroenterology;  Laterality: N/A;  . BIOPSY  09/01/2018   Procedure: BIOPSY;  Surgeon: Ronnette Juniper, MD;  Location[facility] ENDOSCOPY;  Service: Gastroenterology;;  . Lum Keas INJECTION  09/01/2018   Procedure: BOTOX INJECTION;  Surgeon: Ronnette Juniper, MD;  Location: [place];  Service: Gastroenterology;;  . BREAST SURGERY     breast biopsy  . CHOLECYSTECTOMY    . ESOPHAGEAL MANOMETRY N/A 10/07/2018   Procedure: ESOPHAGEAL MANOMETRY (EM);  Surgeon: Ronnette Juniper, MD;  Location: [facility] ENDOSCOPY;  Service: Gastroenterology;  Laterality: N/A;  . ESOPHAGOGASTRODUODENOSCOPY N/A 10/25/2018   Procedure: ESOPHAGOGASTRODUODENOSCOPY (EGD);  Surgeon: Michael Boston, MD;  Location: [facility];  Service: General;  Laterality: N/A;  . ESOPHAGOGASTRODUODENOSCOPY (EGD) WITH PROPOFOL N/A 09/01/2018   Procedure: ESOPHAGOGASTRODUODENOSCOPY (EGD) WITH PROPOFOL;  Surgeon: Ronnette Juniper, MD;  Location: [place];  Service: Gastroenterology;  Laterality: N/A;  . PACEMAKER IMPLANT N/A 03/06/2019   Procedure: PACEMAKER IMPLANT;  Surgeon: Evans Lance, MD;  Location: [place] [facility];  Service: Cardiovascular;  Laterality: N/A;     Current Meds  Medication Sig  . amLODipine (NORVASC) 5 MG tablet  Take 5 mg by mouth daily.  Marland Kitchen apixaban (ELIQUIS) 5 MG TABS tablet Take 1 tablet (5 mg total) by mouth 2 (two) times daily.  . feeding supplement (ENSURE SURGERY) LIQD Take 237 mLs by mouth 2 (two) times daily between meals. (Patient taking differently: Take 355.5 mLs by mouth daily. )  . fexofenadine (ALLEGRA) 180 MG tablet Take 180 mg by mouth daily as needed for allergies or rhinitis.  Marland Kitchen losartan (COZAAR) 100 MG tablet Take 100 mg by mouth daily.  . metoprolol succinate (TOPROL XL) 25 MG 24 hr tablet Take 1 tablet (25 mg total)  by mouth daily.  . Multiple Vitamins-Minerals (MULTIVITAMIN ADULT) CHEW Chew 1 tablet by mouth 3 (three) times a week.   . pantoprazole (PROTONIX) 20 MG tablet Take 20 mg by mouth daily.     Allergies:   Elavil [amitriptyline hcl] and Ketek [telithromycin]   Social History   Tobacco Use  . Smoking status: Never Smoker  . Smokeless tobacco: Never Used  Substance Use Topics  . Alcohol use: No  . Drug use: No     Family Hx: The patient's family history includes CAD in her brother; Diabetes in her mother; Hypertension in her mother.  ROS:   Please see the history of present illness.    General:no colds or fevers, + weight changes if wts correct since May  Skin:no rashes or ulcers HEENT:no blurred vision, no congestion CV:see HPI PUL:see HPI GI:no diarrhea constipation or melena, no indigestion GU:no hematuria, no dysuria MS:no joint pain, no claudication Neuro:no syncope, no lightheadedness Endo:no diabetes, no thyroid disease  All other systems reviewed and are negative.   Prior CV studies:   The following studies were reviewed today: Echo 12/27/18 IMPRESSIONS    1. The left ventricle has normal systolic function, with an ejection fraction of 55-60%. The cavity size was normal. There is mildly increased left ventricular wall thickness. Left ventricular diastolic Doppler parameters are consistent with impaired  relaxation. No evidence of left ventricular regional wall motion abnormalities.  2. The right ventricle has normal systolic function. The cavity was normal. There is no increase in right ventricular wall thickness.  3. Left atrial size was moderately dilated.  4. The pericardial effusion is circumferential.  5. Trivial pericardial effusion is present.  6. Mild mitral valve prolapse.  7. The mitral valve is abnormal. Mild thickening of the mitral valve leaflet.  8. The tricuspid valve is grossly normal.  9. The aortic valve is tricuspid. Mild sclerosis of the  aortic valve. No stenosis of the aortic valve.  SUMMARY   LVEF 55-60%, mild LVH, normal wall motion, grade 1 DD, normal LV filling pressure, normal RV systolic function, moderate LAE, mild late systolic bileaflet prolapse of the mitral valve with trivial MR, mild TR, RVSP 40 mmHg (mild pulmonary hypertension), normal IVC, trivial circumferential pericardial effusion  FINDINGS  Left Ventricle: The left ventricle has normal systolic function, with an ejection fraction of 55-60%. The cavity size was normal. There is mildly increased left ventricular wall thickness. Left ventricular diastolic Doppler parameters are consistent  with impaired relaxation. Normal left ventricular filling pressures No evidence of left ventricular regional wall motion abnormalities..  Right Ventricle: The right ventricle has normal systolic function. The cavity was normal. There is no increase in right ventricular wall thickness.  Left Atrium: Left atrial size was moderately dilated.  Right Atrium: Right atrial size was normal in size. Right atrial pressure is estimated at 10 mmHg.  Interatrial Septum: No atrial  level shunt detected by color flow Doppler.  Pericardium: Trivial pericardial effusion is present. The pericardial effusion is circumferential.  Mitral Valve: The mitral valve is abnormal. Mild thickening of the mitral valve leaflet. Mitral valve regurgitation is mild by color flow Doppler. There is mild late systolic prolapse of of both mitral leaflets of the mitral valve.  Tricuspid Valve: The tricuspid valve is grossly normal. Tricuspid valve regurgitation is mild by color flow Doppler.  Aortic Valve: The aortic valve is tricuspid Mild sclerosis of the aortic valve. Aortic valve regurgitation was not visualized by color flow Doppler. There is No stenosis of the aortic valve.  Pulmonic Valve: The pulmonic valve was grossly normal. Pulmonic valve regurgitation is trivial by color flow Doppler.   Venous: The inferior vena cava measures 1.55 cm, is normal in size with greater than 50% respiratory variability.     Labs/Other Tests and Data Reviewed:    EKG:   A pacing LAD rate 60 03/07/19   Recent Labs: 12/27/2018: ALT 7; Magnesium 1.8; TSH 0.821 03/01/2019: BUN 11; Creatinine, Ser 0.75; Platelets 472; Potassium 4.5; Sodium 139 03/21/2019: Hemoglobin 9.7   Recent Lipid Panel No results found for: CHOL, TRIG, HDL, CHOLHDL, LDLCALC, LDLDIRECT  Wt Readings from Last 3 Encounters:  05/24/19 141 lb (64 kg)  03/21/19 141 lb (64 kg)  03/07/19 137 lb 12.8 oz (62.5 kg)     Objective:    Vital Signs:  BP 140/82   Pulse 76   Ht 5\' 6"  (1.676 m)   Wt 141 lb (64 kg)   SpO2 99%   BMI 22.76 kg/m    Affect appropriate Healthy:  appears stated age 68: normal Neck supple with no adenopathy JVP normal no bruits no thyromegaly Lungs clear with no wheezing and good diaphragmatic motion Heart:  S1/S2 no murmur, no rub, gallop or click PMI normal pacer under left clavicle prominent manubrium  Abdomen: benighn, BS positve, no tenderness, no AAA no bruit.  No HSM or HJR Distal pulses intact with no bruits No edema Neuro non-focal Skin warm and dry No muscular weakness   ASSESSMENT & PLAN:    1. PPM:  St Jude f/u Lovena Le continue beta blocker for tachy   2. Hypokalemia: resolved off diuretic  3. Elevated troponin, not ACS, no angina and normal EF on echo.   4. HTN controlled no change in meds.   5.  PAF:  Will f/u with EP for PACEART and ? PAF going forward for now continue eliquis and lopressor   COVID-19 Education: The signs and symptoms of COVID-19 were discussed with the patient and how to seek care for testing (follow up with PCP or arrange E-visit).  The importance of social distancing was discussed today.   Medication Adjustments/Labs and Tests Ordered: Current medicines are reviewed at length with the patient today.  Concerns regarding medicines are outlined above.    Tests Ordered: No orders of the defined types were placed in this encounter.   Medication Changes: No orders of the defined types were placed in this encounter.    Follow Up: EP January and me 6 months after that   Signed, Jenkins Rouge, MD  05/24/2019 3:29 PM    [place]

## 2019-05-23 NOTE — Telephone Encounter (Signed)
Patient needs assistance ambulating and would like her granddaughter to accompany her to her appointment tomorrow. Will make a note.

## 2019-05-23 NOTE — Telephone Encounter (Signed)
Patient is requesting her granddaughter comes with her to her appointment on 05/24/19. She says she walks with a cain and sometimes falls.

## 2019-05-24 ENCOUNTER — Other Ambulatory Visit: Payer: Self-pay

## 2019-05-24 ENCOUNTER — Ambulatory Visit: Payer: Medicare Other | Admitting: Cardiovascular Disease

## 2019-05-24 ENCOUNTER — Encounter: Payer: Self-pay | Admitting: Cardiovascular Disease

## 2019-05-24 VITALS — BP 140/82 | HR 76 | Ht 66.0 in | Wt 141.0 lb

## 2019-05-24 DIAGNOSIS — I480 Paroxysmal atrial fibrillation: Secondary | ICD-10-CM | POA: Diagnosis not present

## 2019-05-24 DIAGNOSIS — Z950 Presence of cardiac pacemaker: Secondary | ICD-10-CM

## 2019-05-24 NOTE — Patient Instructions (Addendum)
Medication Instructions:   *If you need a refill on your cardiac medications before your next appointment, please call your pharmacy*  Lab Work:  If you have labs (blood work) drawn today and your tests are completely normal, you will receive your results only by: Marland Kitchen MyChart Message (if you have MyChart) OR . A paper copy in the mail If you have any lab test that is abnormal or we need to change your treatment, we will call you to review the results.  Testing/Procedures: None ordered today.   Follow-Up: At [facility], you and your health needs are our priority.  As part of our continuing mission to provide you with exceptional heart care, we have created designated Provider Care Teams.  These Care Teams include your primary Cardiologist (physician) and Advanced Practice Providers (APPs -  Physician Assistants and Nurse Practitioners) who all work together to provide you with the care you need, when you need it.  Your next appointment:   7 month(s)  The format for your next appointment:   In Person  Provider:   You may see Jenkins Rouge, MD or one of the following Advanced Practice Providers on your designated Care Team:    Truitt Merle, NP  Cecilie Kicks, NP  Kathyrn Drown, NP

## 2019-06-22 ENCOUNTER — Other Ambulatory Visit: Payer: Self-pay

## 2019-06-22 ENCOUNTER — Encounter: Payer: Self-pay | Admitting: Internal Medicine

## 2019-06-22 ENCOUNTER — Encounter (INDEPENDENT_AMBULATORY_CARE_PROVIDER_SITE_OTHER): Payer: Self-pay

## 2019-06-22 ENCOUNTER — Ambulatory Visit: Payer: Medicare Other | Admitting: Internal Medicine

## 2019-06-22 VITALS — BP 158/88 | HR 67 | Ht 66.0 in | Wt 141.0 lb

## 2019-06-22 DIAGNOSIS — Z950 Presence of cardiac pacemaker: Secondary | ICD-10-CM | POA: Insufficient documentation

## 2019-06-22 DIAGNOSIS — I10 Essential (primary) hypertension: Secondary | ICD-10-CM

## 2019-06-22 DIAGNOSIS — I480 Paroxysmal atrial fibrillation: Secondary | ICD-10-CM | POA: Diagnosis not present

## 2019-06-22 DIAGNOSIS — I495 Sick sinus syndrome: Secondary | ICD-10-CM | POA: Diagnosis not present

## 2019-06-22 NOTE — Patient Instructions (Signed)
Medication Instructions:  Your physician recommends that you continue on your current medications as directed. Please refer to the Current Medication list given to you today.  Labwork: None ordered.  Testing/Procedures: None ordered.  Follow-Up: Your physician wants you to follow-up in: one year with Dr. Lovena Le.   You will receive a reminder letter in the mail two months in advance. If you don't receive a letter, please call our office to schedule the follow-up appointment.  Remote monitoring is used to monitor your Pacemaker from home. This monitoring reduces the number of office visits required to check your device to one time per year. It allows [place] to keep an eye on the functioning of your device to ensure it is working properly. You are scheduled for a device check from home on 09/20/2019. You may send your transmission at any time that day. If you have a wireless device, the transmission will be sent automatically. After your physician reviews your transmission, you will receive a postcard with your next transmission date.  Any Other Special Instructions Will Be Listed Below (If Applicable).  If you need a refill on your cardiac medications before your next appointment, please call your pharmacy.

## 2019-06-22 NOTE — Progress Notes (Signed)
HPI Shannon Ortiz returns today for ongoing evaluation and management of symptomatic tachy-brady syndrome and 7 second pauses, s/p PPM insertion. She has done well in the interim. She denies chest pain or sob. Her daughter is worried about her not gaining weight.  Allergies  Allergen Reactions  . Elavil [Amitriptyline Hcl] Hives  . Ketek [Telithromycin] Hives     Current Outpatient Medications  Medication Sig Dispense Refill  . amLODipine (NORVASC) 5 MG tablet Take 5 mg by mouth daily.    Marland Kitchen apixaban (ELIQUIS) 5 MG TABS tablet Take 1 tablet (5 mg total) by mouth 2 (two) times daily. 60 tablet 6  . feeding supplement (ENSURE SURGERY) LIQD Take 237 mLs by mouth 2 (two) times daily between meals. 60 Bottle 0  . fexofenadine (ALLEGRA) 180 MG tablet Take 180 mg by mouth daily as needed for allergies or rhinitis.    Marland Kitchen losartan (COZAAR) 100 MG tablet Take 100 mg by mouth daily.    . metoprolol succinate (TOPROL XL) 25 MG 24 hr tablet Take 1 tablet (25 mg total) by mouth daily. 90 tablet 3  . Multiple Vitamins-Minerals (MULTIVITAMIN ADULT) CHEW Chew 1 tablet by mouth 3 (three) times a week.     . pantoprazole (PROTONIX) 20 MG tablet Take 20 mg by mouth daily.     No current facility-administered medications for this visit.     Past Medical History:  Diagnosis Date  . Acquired diverticulum of distal esophagus with dysphagia 10/03/2018  . AKI (acute kidney injury) ([place]) 08/31/2018  . Anxiety   . Bradycardia 02/16/2014  . Chest tightness   . DDD (degenerative disc disease), lumbar   . DJD (degenerative joint disease)   . Dry eyes   . Esophageal stenosis 08/31/2018  . Facial pain   . Fibromyalgia   . Hemorrhoid   . Hypertension   . Hypokalemia 08/31/2018  . Hypothyroidism   . Lightheadedness   . Near syncope 02/16/2014  . Osteopenia   . Sigmoid diverticulosis   . Thyroid nodule    PT DENIES    ROS:   All systems reviewed and negative except as noted in the HPI.   Past  Surgical History:  Procedure Laterality Date  . ABDOMINAL HYSTERECTOMY     COMPLETE  . BALLOON DILATION N/A 09/01/2018   Procedure: BALLOON DILATION;  Surgeon: Ronnette Juniper, MD;  Location: [place];  Service: Gastroenterology;  Laterality: N/A;  . BIOPSY  09/01/2018   Procedure: BIOPSY;  Surgeon: Ronnette Juniper, MD;  Location: [facility] ENDOSCOPY;  Service: Gastroenterology;;  . Lum Keas INJECTION  09/01/2018   Procedure: BOTOX INJECTION;  Surgeon: Ronnette Juniper, MD;  Location: [place];  Service: Gastroenterology;;  . BREAST SURGERY     breast biopsy  . CHOLECYSTECTOMY    . ESOPHAGEAL MANOMETRY N/A 10/07/2018   Procedure: ESOPHAGEAL MANOMETRY (EM);  Surgeon: Ronnette Juniper, MD;  [facility]: [facility];  Service: Gastroenterology;  Laterality: N/A;  . ESOPHAGOGASTRODUODENOSCOPY N/A 10/25/2018   Procedure: ESOPHAGOGASTRODUODENOSCOPY (EGD);  Surgeon: Michael Boston, MD;  [facility]: [facility];  Service: General;  Laterality: N/A;  . ESOPHAGOGASTRODUODENOSCOPY (EGD) WITH PROPOFOL N/A 09/01/2018   Procedure: ESOPHAGOGASTRODUODENOSCOPY (EGD) WITH PROPOFOL;  Surgeon: Ronnette Juniper, MD;  Location: [place];  Service: Gastroenterology;  Laterality: N/A;  . PACEMAKER IMPLANT N/A 03/06/2019   Procedure: PACEMAKER IMPLANT;  Surgeon: Evans Lance, MD;  Location: [place] [facility];  Service: Cardiovascular;  Laterality: N/A;     Family History  Problem Relation Age of Onset  .  Hypertension Mother   . Diabetes Mother        non-insulin dependant  . CAD Brother      Social History   Socioeconomic History  . Marital status: Widowed    Spouse name: Not on file  . Number of children: Not on file  . Years of education: Not on file  . Highest education level: Not on file  Occupational History  . Not on file  Tobacco Use  . Smoking status: Never Smoker  . Smokeless tobacco: Never Used  Substance and Sexual Activity  . Alcohol use: No  . Drug use: No  . Sexual activity: Never  Other Topics Concern  . Not  on file  Social History Narrative  . Not on file   Social Determinants of Health   Financial Resource Strain:   . Difficulty of Paying Living Expenses: Not on file  Food Insecurity:   . Worried About Charity fundraiser in the Last Year: Not on file  . Ran Out of Food in the Last Year: Not on file  Transportation Needs:   . Lack of Transportation (Medical): Not on file  . Lack of Transportation (Non-Medical): Not on file  Physical Activity:   . Days of Exercise per Week: Not on file  . Minutes of Exercise per Session: Not on file  Stress:   . Feeling of Stress : Not on file  Social Connections:   . Frequency of Communication with Friends and Family: Not on file  . Frequency of Social Gatherings with Friends and Family: Not on file  . Attends Religious Services: Not on file  . Active Member of Clubs or Organizations: Not on file  . Attends Archivist Meetings: Not on file  . Marital Status: Not on file  Intimate Partner Violence:   . Fear of Current or Ex-Partner: Not on file  . Emotionally Abused: Not on file  . Physically Abused: Not on file  . Sexually Abused: Not on file     BP (!) 158/88   Pulse 67   Ht 5\' 6"  (1.676 m)   Wt 141 lb (64 kg)   SpO2 99%   BMI 22.76 kg/m   Physical Exam:  Well appearing NAD HEENT: Unremarkable Neck:  No JVD, no thyromegally Lymphatics:  No adenopathy Back:  No CVA tenderness Lungs:  Clear with no wheezes.  HEART:  Regular rate rhythm, no murmurs, no rubs, no clicks Abd:  soft, positive bowel sounds, no organomegally, no rebound, no guarding Ext:  2 plus pulses, no edema, no cyanosis, no clubbing Skin:  No rashes no nodules Neuro:  CN II through XII intact, motor grossly intact  EKG - NSR  DEVICE  Normal device function.  See PaceArt for details.   Assess/Plan: 1. PAF - she appears to mostly be maintaining NSR. No change. 2. Coags - she has not had any bleeding on Eliquis. 3. Sinus node dysfunction - she is s/p  PPM insertion and is asymptomatic. 4. PPM - her St. Jude DDD PM is working normally. We will recheck in several months.   Mikle [email or web address].D.

## 2019-08-04 ENCOUNTER — Ambulatory Visit (INDEPENDENT_AMBULATORY_CARE_PROVIDER_SITE_OTHER): Payer: Medicare Other | Admitting: *Deleted

## 2019-08-04 DIAGNOSIS — R001 Bradycardia, unspecified: Secondary | ICD-10-CM

## 2019-08-05 LAB — CUP PACEART REMOTE DEVICE CHECK
Battery Remaining Longevity: 115 mo
Battery Remaining Percentage: 95.5 %
Battery Voltage: 3.01 V
Brady Statistic AP VP Percent: 1 %
Brady Statistic AP VS Percent: 96 %
Brady Statistic AS VP Percent: 1 %
Brady Statistic AS VS Percent: 2.2 %
Brady Statistic RA Percent Paced: 93 %
Brady Statistic RV Percent Paced: 1.3 %
Date Time Interrogation Session: 20210220023116
Implantable Lead Implant Date: 20200921
Implantable Lead Implant Date: 20200921
Implantable Lead Location: 753859
Implantable Lead Location: 753860
Implantable Pulse Generator Implant Date: 20200921
Lead Channel Impedance Value: 430 Ohm
Lead Channel Impedance Value: 640 Ohm
Lead Channel Pacing Threshold Amplitude: 0.75 V
Lead Channel Pacing Threshold Amplitude: 0.875 V
Lead Channel Pacing Threshold Pulse Width: 0.5 ms
Lead Channel Pacing Threshold Pulse Width: 0.5 ms
Lead Channel Sensing Intrinsic Amplitude: 12 mV
Lead Channel Sensing Intrinsic Amplitude: 2.6 mV
Lead Channel Setting Pacing Amplitude: 1 V
Lead Channel Setting Pacing Amplitude: 1.875
Lead Channel Setting Pacing Pulse Width: 0.5 ms
Lead Channel Setting Sensing Sensitivity: 2 mV
Pulse Gen Model: 2272
Pulse Gen Serial Number: 9162202

## 2019-10-07 ENCOUNTER — Other Ambulatory Visit: Payer: Self-pay | Admitting: Physician Assistant

## 2019-10-09 NOTE — Telephone Encounter (Signed)
Last OV 06/22/19 Scr 0.75 on 03/01/19 53kg 84 years old Eliquis 5mg  BID sent to pharmacy

## 2019-12-18 NOTE — Progress Notes (Signed)
Date:  12/19/2019   ID:  Shannon Ortiz, DOB 1935-07-31, MRN [id number]  Provider Location: Home  PCP:  Lawerance Cruel, MD  Cardiologist:  Jenkins Rouge, MD  Electrophysiologist: Lovena Le   Evaluation Performed:  Follow-Up Visit  History of Present Illness:    Shannon Ortiz is a 84 y.o. female with PAF/SSS/PPM  She has a hx of Hypertension, CKD stage 3, hypothyroidism, esophageal stenosis, s/p esophageal diverticulectomy and hiatal hernia repair 10/25/18   Hospitalized 12/27/18 with weakness.  occ rapid HR.   K+ was 2.9 and was anemic with Hgb at 8.7   troponins mildly elevated at 221 to 202   CK neg.  .  EKG Sinus tachycardia, 101 bpm, LAD, PVC, Incomplete left bundle branch block  Not felt to be ACS and Echo with EF 55-60%, G1DD, mild late systolic bileaflet prolapse of MV with trivial MR, mild TR mild pulmonary HTN.    Subsequent monitor 02/28/19 with frequent episodes of PAF with > 7 second post termination pauses Had PPM placed 03/06/19 by Dr Percell [place] Jude dual chamber On eliquis for PAF CHADVASC 4   Started on Toprol 25 mg daily by PA 03/21/19   Doing well no cardiac symptoms Grand daughter with her today she is a Paramedic at [employer]  Using her pacer as an excuse not to travel or be active Discussed this with her middle daughter   The patient does not have symptoms concerning for COVID-19 infection (fever, chills, cough, or new shortness of breath).  She tested neg in hospital.    Past Medical History:  Diagnosis Date  . Acquired diverticulum of distal esophagus with dysphagia 10/03/2018  . AKI (acute kidney injury) ([place]) 08/31/2018  . Anxiety   . Bradycardia 02/16/2014  . Chest tightness   . DDD (degenerative disc disease), lumbar   . DJD (degenerative joint disease)   . Dry eyes   . Esophageal stenosis 08/31/2018  . Facial pain   . Fibromyalgia   . Hemorrhoid   . Hypertension   . Hypokalemia 08/31/2018  . Hypothyroidism   . Lightheadedness   . Near syncope 02/16/2014  .  Osteopenia   . Sigmoid diverticulosis   . Thyroid nodule    PT DENIES   Past Surgical History:  Procedure Laterality Date  . ABDOMINAL HYSTERECTOMY     COMPLETE  . BALLOON DILATION N/A 09/01/2018   Procedure: BALLOON DILATION;  Surgeon: Ronnette Juniper, MD;  Location: [place];  Service: Gastroenterology;  Laterality: N/A;  . BIOPSY  09/01/2018   Procedure: BIOPSY;  Surgeon: Ronnette Juniper, MD;  Location: [facility] ENDOSCOPY;  Service: Gastroenterology;;  . Lum Keas INJECTION  09/01/2018   Procedure: BOTOX INJECTION;  Surgeon: Ronnette Juniper, MD;  Location: [place];  Service: Gastroenterology;;  . BREAST SURGERY     breast biopsy  . CHOLECYSTECTOMY    . ESOPHAGEAL MANOMETRY N/A 10/07/2018   Procedure: ESOPHAGEAL MANOMETRY (EM);  Surgeon: Ronnette Juniper, MD;  [facility];  Service: Gastroenterology;  Laterality: N/A;  . ESOPHAGOGASTRODUODENOSCOPY N/A 10/25/2018   Procedure: ESOPHAGOGASTRODUODENOSCOPY (EGD);  Surgeon: Michael Boston, MD;  [facility];  Service: General;  Laterality: N/A;  . ESOPHAGOGASTRODUODENOSCOPY (EGD) WITH PROPOFOL N/A 09/01/2018   Procedure: ESOPHAGOGASTRODUODENOSCOPY (EGD) WITH PROPOFOL;  Surgeon: Ronnette Juniper, MD;  Location: [place];  Service: Gastroenterology;  Laterality: N/A;  . PACEMAKER IMPLANT N/A 03/06/2019   Procedure: PACEMAKER IMPLANT;  Surgeon: Evans Lance, MD;  Location: [place] [facility];  Service: Cardiovascular;  Laterality: N/A;  Current Meds  Medication Sig  . amLODipine (NORVASC) 5 MG tablet Take 5 mg by mouth daily.  Marland Kitchen ELIQUIS 5 MG TABS tablet Take 1 tablet by mouth twice daily  . feeding supplement (ENSURE SURGERY) LIQD Take 237 mLs by mouth 2 (two) times daily between meals.  . fexofenadine (ALLEGRA) 180 MG tablet Take 180 mg by mouth daily as needed for allergies or rhinitis.  Marland Kitchen losartan (COZAAR) 100 MG tablet Take 100 mg by mouth daily.  . metoprolol succinate (TOPROL XL) 25 MG 24 hr tablet Take 1 tablet (25 mg total) by mouth  daily.  . Multiple Vitamins-Minerals (MULTIVITAMIN ADULT) CHEW Chew 1 tablet by mouth 3 (three) times a week.   . pantoprazole (PROTONIX) 20 MG tablet Take 20 mg by mouth daily.     Allergies:   Elavil [amitriptyline hcl] and Ketek [telithromycin]   Social History   Tobacco Use  . Smoking status: Never Smoker  . Smokeless tobacco: Never Used  Vaping Use  . Vaping Use: Never used  Substance Use Topics  . Alcohol use: No  . Drug use: No     Family Hx: The patient's family history includes CAD in her brother; Diabetes in her mother; Hypertension in her mother.  ROS:   Please see the history of present illness.    General:no colds or fevers, + weight changes if wts correct since May  Skin:no rashes or ulcers HEENT:no blurred vision, no congestion CV:see HPI PUL:see HPI GI:no diarrhea constipation or melena, no indigestion GU:no hematuria, no dysuria MS:no joint pain, no claudication Neuro:no syncope, no lightheadedness Endo:no diabetes, no thyroid disease  All other systems reviewed and are negative.   Prior CV studies:   The following studies were reviewed today: Echo 12/27/18 IMPRESSIONS    1. The left ventricle has normal systolic function, with an ejection fraction of 55-60%. The cavity size was normal. There is mildly increased left ventricular wall thickness. Left ventricular diastolic Doppler parameters are consistent with impaired  relaxation. No evidence of left ventricular regional wall motion abnormalities.  2. The right ventricle has normal systolic function. The cavity was normal. There is no increase in right ventricular wall thickness.  3. Left atrial size was moderately dilated.  4. The pericardial effusion is circumferential.  5. Trivial pericardial effusion is present.  6. Mild mitral valve prolapse.  7. The mitral valve is abnormal. Mild thickening of the mitral valve leaflet.  8. The tricuspid valve is grossly normal.  9. The aortic valve is  tricuspid. Mild sclerosis of the aortic valve. No stenosis of the aortic valve.   Labs/Other Tests and Data Reviewed:    EKG:   A pacing LAD rate 60 03/07/19   Recent Labs: 12/27/2018: ALT 7; Magnesium 1.8; TSH 0.821 03/01/2019: BUN 11; Creatinine, Ser 0.75; Platelets 472; Potassium 4.5; Sodium 139 03/21/2019: Hemoglobin 9.7   Recent Lipid Panel No results found for: CHOL, TRIG, HDL, CHOLHDL, LDLCALC, LDLDIRECT  Wt Readings from Last 3 Encounters:  12/19/19 133 lb (60.3 kg)  06/22/19 141 lb (64 kg)  05/24/19 141 lb (64 kg)     Objective:    Vital Signs:  BP (!) 142/80   Pulse 76   Ht 5\' 7"  (1.702 m)   Wt 133 lb (60.3 kg)   SpO2 98%   BMI 20.83 kg/m    Affect appropriate Healthy:  appears stated age HEENT: normal Neck supple with no adenopathy JVP normal no bruits no thyromegaly Lungs clear with no wheezing and good  diaphragmatic motion Heart:  S1/S2 no murmur, no rub, gallop or click PMI normal pacer under left clavicle prominent manubrium  Abdomen: benighn, BS positve, no tenderness, no AAA no bruit.  No HSM or HJR Distal pulses intact with no bruits No edema Neuro non-focal Skin warm and dry No muscular weakness   ASSESSMENT & PLAN:    1. PPM:  St Jude f/u Lovena Le continue beta blocker for tachy   2. Hypokalemia: resolved off diuretic  3. Elevated troponin, not ACS, no angina and normal EF on echo.   4. HTN controlled no change in meds.   5.  PAF:  Will f/u with EP for PACEART and ? PAF going forward for now continue eliquis and lopressor   COVID-19 Education: The signs and symptoms of COVID-19 were discussed with the patient and how to seek care for testing (follow up with PCP or arrange E-visit).  The importance of social distancing was discussed today.   Medication Adjustments/Labs and Tests Ordered: Current medicines are reviewed at length with the patient today.  Concerns regarding medicines are outlined above.   Tests Ordered: No orders of the  defined types were placed in this encounter.   Medication Changes: No orders of the defined types were placed in this encounter.    Follow Up: EP 6 months Don't think she needs general cardiology f/u   Signed, Jenkins Rouge, MD  12/19/2019 9:31 AM    [place]

## 2019-12-19 ENCOUNTER — Other Ambulatory Visit: Payer: Self-pay

## 2019-12-19 ENCOUNTER — Encounter: Payer: Self-pay | Admitting: Cardiovascular Disease

## 2019-12-19 ENCOUNTER — Encounter (INDEPENDENT_AMBULATORY_CARE_PROVIDER_SITE_OTHER): Payer: Self-pay

## 2019-12-19 ENCOUNTER — Ambulatory Visit (INDEPENDENT_AMBULATORY_CARE_PROVIDER_SITE_OTHER): Payer: Medicare Other | Admitting: Cardiovascular Disease

## 2019-12-19 VITALS — BP 142/80 | HR 76 | Ht 67.0 in | Wt 133.0 lb

## 2019-12-19 DIAGNOSIS — I480 Paroxysmal atrial fibrillation: Secondary | ICD-10-CM | POA: Diagnosis not present

## 2019-12-19 DIAGNOSIS — Z950 Presence of cardiac pacemaker: Secondary | ICD-10-CM

## 2019-12-19 NOTE — Patient Instructions (Signed)
Medication Instructions:  *If you need a refill on your cardiac medications before your next appointment, please call your pharmacy*  Lab Work: If you have labs (blood work) drawn today and your tests are completely normal, you will receive your results only by: Marland Kitchen MyChart Message (if you have MyChart) OR . A paper copy in the mail If you have any lab test that is abnormal or we need to change your treatment, we will call you to review the results.  Testing/Procedures: None ordered today.  Follow-Up: At [facility], you and your health needs are our priority.  As part of our continuing mission to provide you with exceptional heart care, we have created designated Provider Care Teams.  These Care Teams include your primary Cardiologist (physician) and Advanced Practice Providers (APPs -  Physician Assistants and Nurse Practitioners) who all work together to provide you with the care you need, when you need it.  We recommend signing up for the patient portal called "MyChart".  Sign up information is provided on this After Visit Summary.  MyChart is used to connect with patients for Virtual Visits (Telemedicine).  Patients are able to view lab/test results, encounter notes, upcoming appointments, etc.  Non-urgent messages can be sent to your provider as well.   To learn more about what you can do with MyChart, go to [email or web address].    Your next appointment:   6 month(s)  The format for your next appointment:   In Person  Provider:   You may see Dr. Lovena Le or one of the following Advanced Practice Providers on your designated Care Team:    Chanetta Marshall, NP  Tommye Standard, PA-C  Legrand Como "[place]" [place], [place]

## 2020-02-02 ENCOUNTER — Ambulatory Visit (INDEPENDENT_AMBULATORY_CARE_PROVIDER_SITE_OTHER): Payer: Medicare Other | Admitting: *Deleted

## 2020-02-02 DIAGNOSIS — I495 Sick sinus syndrome: Secondary | ICD-10-CM | POA: Diagnosis not present

## 2020-02-02 LAB — CUP PACEART REMOTE DEVICE CHECK
Battery Remaining Longevity: 110 mo
Battery Remaining Percentage: 95.5 %
Battery Voltage: 3.01 V
Brady Statistic AP VP Percent: 1.2 %
Brady Statistic AP VS Percent: 95 %
Brady Statistic AS VP Percent: 1 %
Brady Statistic AS VS Percent: 2.6 %
Brady Statistic RA Percent Paced: 92 %
Brady Statistic RV Percent Paced: 1.8 %
Date Time Interrogation Session: 20210820020024
Implantable Lead Implant Date: 20200921
Implantable Lead Implant Date: 20200921
Implantable Lead Location: 753859
Implantable Lead Location: 753860
Implantable Pulse Generator Implant Date: 20200921
Lead Channel Impedance Value: 460 Ohm
Lead Channel Impedance Value: 690 Ohm
Lead Channel Pacing Threshold Amplitude: 0.75 V
Lead Channel Pacing Threshold Amplitude: 1.125 V
Lead Channel Pacing Threshold Pulse Width: 0.5 ms
Lead Channel Pacing Threshold Pulse Width: 0.5 ms
Lead Channel Sensing Intrinsic Amplitude: 12 mV
Lead Channel Sensing Intrinsic Amplitude: 2.3 mV
Lead Channel Setting Pacing Amplitude: 1 V
Lead Channel Setting Pacing Amplitude: 2.125
Lead Channel Setting Pacing Pulse Width: 0.5 ms
Lead Channel Setting Sensing Sensitivity: 2 mV
Pulse Gen Model: 2272
Pulse Gen Serial Number: 9162202

## 2020-02-05 NOTE — Progress Notes (Signed)
Remote pacemaker transmission.   

## 2020-03-08 ENCOUNTER — Other Ambulatory Visit: Payer: Self-pay | Admitting: Physician Assistant

## 2020-04-01 ENCOUNTER — Other Ambulatory Visit: Payer: Self-pay | Admitting: Internal Medicine

## 2020-04-01 NOTE — Telephone Encounter (Signed)
Pt last saw Dr Johnsie Cancel 12/19/19, last labs 03/27/20 Creat 0.87 at [facility] per [facility], age 84, weight 60.3kg, based on specified criteria pt is on appropriate dosage of Eliquis 5mg  BID.  Will refill rx.

## 2020-05-03 ENCOUNTER — Ambulatory Visit (INDEPENDENT_AMBULATORY_CARE_PROVIDER_SITE_OTHER): Payer: Medicare Other

## 2020-05-03 DIAGNOSIS — I495 Sick sinus syndrome: Secondary | ICD-10-CM | POA: Diagnosis not present

## 2020-05-05 LAB — CUP PACEART REMOTE DEVICE CHECK
Battery Remaining Longevity: 112 mo
Battery Remaining Percentage: 95.5 %
Battery Voltage: 3.01 V
Brady Statistic AP VP Percent: 1.3 %
Brady Statistic AP VS Percent: 95 %
Brady Statistic AS VP Percent: 1 %
Brady Statistic AS VS Percent: 2.3 %
Brady Statistic RA Percent Paced: 92 %
Brady Statistic RV Percent Paced: 1.7 %
Date Time Interrogation Session: 20211119020014
Implantable Lead Implant Date: 20200921
Implantable Lead Implant Date: 20200921
Implantable Lead Location: 753859
Implantable Lead Location: 753860
Implantable Pulse Generator Implant Date: 20200921
Lead Channel Impedance Value: 410 Ohm
Lead Channel Impedance Value: 630 Ohm
Lead Channel Pacing Threshold Amplitude: 0.75 V
Lead Channel Pacing Threshold Amplitude: 1.125 V
Lead Channel Pacing Threshold Pulse Width: 0.5 ms
Lead Channel Pacing Threshold Pulse Width: 0.5 ms
Lead Channel Sensing Intrinsic Amplitude: 12 mV
Lead Channel Sensing Intrinsic Amplitude: 2.4 mV
Lead Channel Setting Pacing Amplitude: 1 V
Lead Channel Setting Pacing Amplitude: 2.125
Lead Channel Setting Pacing Pulse Width: 0.5 ms
Lead Channel Setting Sensing Sensitivity: 2 mV
Pulse Gen Model: 2272
Pulse Gen Serial Number: 9162202

## 2020-05-06 NOTE — Progress Notes (Signed)
Remote pacemaker transmission.   

## 2020-06-20 DIAGNOSIS — I480 Paroxysmal atrial fibrillation: Secondary | ICD-10-CM | POA: Diagnosis not present

## 2020-06-20 DIAGNOSIS — E7800 Pure hypercholesterolemia, unspecified: Secondary | ICD-10-CM | POA: Diagnosis not present

## 2020-06-20 DIAGNOSIS — I10 Essential (primary) hypertension: Secondary | ICD-10-CM | POA: Diagnosis not present

## 2020-06-21 ENCOUNTER — Encounter (INDEPENDENT_AMBULATORY_CARE_PROVIDER_SITE_OTHER): Payer: Self-pay

## 2020-06-21 ENCOUNTER — Other Ambulatory Visit: Payer: Self-pay

## 2020-06-21 ENCOUNTER — Encounter: Payer: Self-pay | Admitting: Internal Medicine

## 2020-06-21 ENCOUNTER — Ambulatory Visit (INDEPENDENT_AMBULATORY_CARE_PROVIDER_SITE_OTHER): Payer: Medicare Other | Admitting: Internal Medicine

## 2020-06-21 VITALS — BP 150/82 | HR 92 | Ht 67.0 in | Wt 137.0 lb

## 2020-06-21 DIAGNOSIS — I480 Paroxysmal atrial fibrillation: Secondary | ICD-10-CM

## 2020-06-21 DIAGNOSIS — Z950 Presence of cardiac pacemaker: Secondary | ICD-10-CM | POA: Diagnosis not present

## 2020-06-21 DIAGNOSIS — I495 Sick sinus syndrome: Secondary | ICD-10-CM

## 2020-06-21 NOTE — Patient Instructions (Signed)
Medication Instructions:  Your physician recommends that you continue on your current medications as directed. Please refer to the Current Medication list given to you today.  Labwork: None ordered.  Testing/Procedures: None ordered.  Follow-Up: Your physician wants you to follow-up in: one year with Cristopher Peru, MD or one of the following Advanced Practice Providers on your designated Care Team:    Chanetta Marshall, NP  Tommye Standard, PA-C  Legrand Como "Jonni Sanger" [place], [place]  Remote monitoring is used to monitor your Pacemaker from home. This monitoring reduces the number of office visits required to check your device to one time per year. It allows [place] to keep an eye on the functioning of your device to ensure it is working properly. You are scheduled for a device check from home on 08/02/2020. You may send your transmission at any time that day. If you have a wireless device, the transmission will be sent automatically. After your physician reviews your transmission, you will receive a postcard with your next transmission date.  Any Other Special Instructions Will Be Listed Below (If Applicable).  If you need a refill on your cardiac medications before your next appointment, please call your pharmacy.

## 2020-06-21 NOTE — Progress Notes (Signed)
HPI Shannon Ortiz returns today for ongoing evaluation and management of symptomatic tachy-brady syndrome and 7  pauses, s/p PPM insertion. She has done well in the interim. She denies chest pain or sob. Her daughter is worried about her not gaining weight. Since I saw her last, her palpitations have resolved. She has not had syncope. She has minimal edema. No chest pain.  Allergies  Allergen Reactions  . Elavil [Amitriptyline Hcl] Hives  . Ketek [Telithromycin] Hives     Current Outpatient Medications  Medication Sig Dispense Refill  . amLODipine (NORVASC) 5 MG tablet Take 5 mg by mouth daily.    Marland Kitchen ELIQUIS 5 MG TABS tablet Take 1 tablet by mouth twice daily 60 tablet 5  . feeding supplement (ENSURE SURGERY) LIQD Take 237 mLs by mouth 2 (two) times daily between meals. 60 Bottle 0  . fexofenadine (ALLEGRA) 180 MG tablet Take 180 mg by mouth daily as needed for allergies or rhinitis.    Marland Kitchen losartan (COZAAR) 100 MG tablet Take 100 mg by mouth daily.    . metoprolol succinate (TOPROL-XL) 25 MG 24 hr tablet Take 1 tablet by mouth once daily 90 tablet 0  . Multiple Vitamins-Minerals (MULTIVITAMIN ADULT) CHEW Chew 1 tablet by mouth 3 (three) times a week.     . pantoprazole (PROTONIX) 20 MG tablet Take 20 mg by mouth daily.     No current facility-administered medications for this visit.     Past Medical History:  Diagnosis Date  . Acquired diverticulum of distal esophagus with dysphagia 10/03/2018  . AKI (acute kidney injury) ([place]) 08/31/2018  . Anxiety   . Bradycardia 02/16/2014  . Chest tightness   . DDD (degenerative disc disease), lumbar   . DJD (degenerative joint disease)   . Dry eyes   . Esophageal stenosis 08/31/2018  . Facial pain   . Fibromyalgia   . Hemorrhoid   . Hypertension   . Hypokalemia 08/31/2018  . Hypothyroidism   . Lightheadedness   . Near syncope 02/16/2014  . Osteopenia   . Sigmoid diverticulosis   . Thyroid nodule    PT DENIES    ROS:   All systems  reviewed and negative except as noted in the HPI.   Past Surgical History:  Procedure Laterality Date  . ABDOMINAL HYSTERECTOMY     COMPLETE  . BALLOON DILATION N/A 09/01/2018   Procedure: BALLOON DILATION;  Surgeon: Ronnette Juniper, MD;  Location: [place];  Service: Gastroenterology;  Laterality: N/A;  . BIOPSY  09/01/2018   Procedure: BIOPSY;  Surgeon: Ronnette Juniper, MD;  Location[facility] END[facility]COPY;  Service: Gastroenterology;;  . Lum Keas INJECTION  09/01/2018   Procedure: BOTOX INJECTION;  Surgeon: Ronnette Juniper, MD;  Location: [place];  Service: Gastroenterology;;  . BREAST SURGERY     breast biopsy  . CHOLECYSTECTOMY    . ESOPHAGEAL MANOMETRY N/A 10/07/2018   Procedure: ESOPHAGEAL MANOMETRY (EM);  Surgeon: Ronnette Juniper, MD;  [facility];  Service: Gastroenterology;  Laterality: N/A;  . ESOPHAGOGASTRODUODENOSCOPY N/A 10/25/2018   Procedure: ESOPHAGOGASTRODUODENOSCOPY (EGD);  Surgeon: Michael Boston, MD;  [facility];  Service: General;  Laterality: N/A;  . ESOPHAGOGASTRODUODENOSCOPY (EGD) WITH PROPOFOL N/A 09/01/2018   Procedure: ESOPHAGOGASTRODUODENOSCOPY (EGD) WITH PROPOFOL;  Surgeon: Ronnette Juniper, MD;  Location: [place];  Service: Gastroenterology;  Laterality: N/A;  . PACEMAKER IMPLANT N/A 03/06/2019   Procedure: PACEMAKER IMPLANT;  Surgeon: Evans Lance, MD;  Location: [place] [facility];  Service: Cardiovascular;  Laterality: N/A;  Family History  Problem Relation Age of Onset  . Hypertension Mother   . Diabetes Mother        non-insulin dependant  . CAD Brother      Social History   Socioeconomic History  . Marital status: Widowed    Spouse name: Not on file  . Number of children: Not on file  . Years of education: Not on file  . Highest education level: Not on file  Occupational History  . Not on file  Tobacco Use  . Smoking status: Never Smoker  . Smokeless tobacco: Never Used  Vaping Use  . Vaping Use: Never used  Substance and Sexual  Activity  . Alcohol use: No  . Drug use: No  . Sexual activity: Never  Other Topics Concern  . Not on file  Social History Narrative  . Not on file   Social Determinants of Health   Financial Resource Strain: Not on file  Food Insecurity: Not on file  Transportation Needs: Not on file  Physical Activity: Not on file  Stress: Not on file  Social Connections: Not on file  Intimate Partner Violence: Not on file     BP (!) 150/82   Pulse 92   Ht 5\' 7"  (1.702 m)   Wt 137 lb (62.1 kg)   SpO2 98%   BMI 21.46 kg/m   Physical Exam:  Well appearing NAD HEENT: Unremarkable Neck:  No JVD, no thyromegally Lymphatics:  No adenopathy Back:  No CVA tenderness Lungs:  Clear HEART:  Regular rate rhythm, no murmurs, no rubs, no clicks Abd:  soft, positive bowel sounds, no organomegally, no rebound, no guarding Ext:  2 plus pulses, no edema, no cyanosis, no clubbing Skin:  No rashes no nodules Neuro:  CN II through XII intact, motor grossly intact  EKG -  NSR with PVC's and some atrial pacing  DEVICE  Normal device function.  See PaceArt for details.   Assess/Plan: 1. PAF - she appears to mostly be maintaining NSR. No change. 2. Coags - she has not had any bleeding on Eliquis. 3. Sinus node dysfunction - she is s/p PPM insertion and is asymptomatic. 4. PPM - her St. Jude DDD PM is working normally. We will recheck in several months.

## 2020-07-02 ENCOUNTER — Encounter: Payer: Medicare Other | Admitting: Internal Medicine

## 2020-08-02 ENCOUNTER — Ambulatory Visit (INDEPENDENT_AMBULATORY_CARE_PROVIDER_SITE_OTHER): Payer: Medicare Other

## 2020-08-02 DIAGNOSIS — I495 Sick sinus syndrome: Secondary | ICD-10-CM

## 2020-08-02 LAB — CUP PACEART REMOTE DEVICE CHECK
Battery Remaining Longevity: 114 mo
Battery Remaining Percentage: 95.5 %
Battery Voltage: 2.99 V
Brady Statistic AP VP Percent: 1.4 %
Brady Statistic AP VS Percent: 91 %
Brady Statistic AS VP Percent: 1 %
Brady Statistic AS VS Percent: 1.5 %
Brady Statistic RA Percent Paced: 81 %
Brady Statistic RV Percent Paced: 1.5 %
Date Time Interrogation Session: 20220218112034
Implantable Lead Implant Date: 20200921
Implantable Lead Implant Date: 20200921
Implantable Lead Location: 753859
Implantable Lead Location: 753860
Implantable Pulse Generator Implant Date: 20200921
Lead Channel Impedance Value: 410 Ohm
Lead Channel Impedance Value: 630 Ohm
Lead Channel Pacing Threshold Amplitude: 0.75 V
Lead Channel Pacing Threshold Amplitude: 1.75 V
Lead Channel Pacing Threshold Pulse Width: 0.5 ms
Lead Channel Pacing Threshold Pulse Width: 0.5 ms
Lead Channel Sensing Intrinsic Amplitude: 1.9 mV
Lead Channel Sensing Intrinsic Amplitude: 12 mV
Lead Channel Setting Pacing Amplitude: 1 V
Lead Channel Setting Pacing Amplitude: 3.25 V
Lead Channel Setting Pacing Pulse Width: 0.5 ms
Lead Channel Setting Sensing Sensitivity: 2 mV
Pulse Gen Model: 2272
Pulse Gen Serial Number: 9162202

## 2020-08-06 NOTE — Progress Notes (Signed)
Remote pacemaker transmission.   

## 2020-09-12 DIAGNOSIS — I10 Essential (primary) hypertension: Secondary | ICD-10-CM | POA: Diagnosis not present

## 2020-09-12 DIAGNOSIS — E7800 Pure hypercholesterolemia, unspecified: Secondary | ICD-10-CM | POA: Diagnosis not present

## 2020-09-12 DIAGNOSIS — I480 Paroxysmal atrial fibrillation: Secondary | ICD-10-CM | POA: Diagnosis not present

## 2020-09-25 ENCOUNTER — Other Ambulatory Visit: Payer: Self-pay | Admitting: Cardiovascular Disease

## 2020-09-25 NOTE — Telephone Encounter (Signed)
Pt last saw Dr Lovena Le 06/21/20, last labs 03/27/20 Creat 0.87 at [facility] per [facility], age 85, weight 62.1kg, based on specified criteria pt is on appropriate dosage of Eliquis 5mg  BID.  Will refill rx.

## 2020-11-01 ENCOUNTER — Ambulatory Visit (INDEPENDENT_AMBULATORY_CARE_PROVIDER_SITE_OTHER): Payer: Medicare Other

## 2020-11-01 DIAGNOSIS — I495 Sick sinus syndrome: Secondary | ICD-10-CM | POA: Diagnosis not present

## 2020-11-01 LAB — CUP PACEART REMOTE DEVICE CHECK
Battery Remaining Longevity: 114 mo
Battery Remaining Percentage: 95.5 %
Battery Voltage: 2.99 V
Brady Statistic AP VP Percent: 1.5 %
Brady Statistic AP VS Percent: 93 %
Brady Statistic AS VP Percent: 1 %
Brady Statistic AS VS Percent: 2.2 %
Brady Statistic RA Percent Paced: 88 %
Brady Statistic RV Percent Paced: 1.6 %
Date Time Interrogation Session: 20220520020013
Implantable Lead Implant Date: 20200921
Implantable Lead Implant Date: 20200921
Implantable Lead Location: 753859
Implantable Lead Location: 753860
Implantable Pulse Generator Implant Date: 20200921
Lead Channel Impedance Value: 400 Ohm
Lead Channel Impedance Value: 610 Ohm
Lead Channel Pacing Threshold Amplitude: 0.75 V
Lead Channel Pacing Threshold Amplitude: 0.75 V
Lead Channel Pacing Threshold Pulse Width: 0.5 ms
Lead Channel Pacing Threshold Pulse Width: 0.5 ms
Lead Channel Sensing Intrinsic Amplitude: 12 mV
Lead Channel Sensing Intrinsic Amplitude: 2.1 mV
Lead Channel Setting Pacing Amplitude: 1 V
Lead Channel Setting Pacing Amplitude: 1.75 V
Lead Channel Setting Pacing Pulse Width: 0.5 ms
Lead Channel Setting Sensing Sensitivity: 2 mV
Pulse Gen Model: 2272
Pulse Gen Serial Number: 9162202

## 2020-11-18 NOTE — Progress Notes (Signed)
Remote pacemaker transmission.   

## 2020-12-03 DIAGNOSIS — I480 Paroxysmal atrial fibrillation: Secondary | ICD-10-CM | POA: Diagnosis not present

## 2020-12-03 DIAGNOSIS — I10 Essential (primary) hypertension: Secondary | ICD-10-CM | POA: Diagnosis not present

## 2020-12-03 DIAGNOSIS — E7800 Pure hypercholesterolemia, unspecified: Secondary | ICD-10-CM | POA: Diagnosis not present

## 2020-12-10 IMAGING — DX PORTABLE CHEST - 1 VIEW
1 series · 1 of 1 positions shown · non-contrast
Comparison: 02/16/2014

CLINICAL DATA: I had miss, palpitations, no dysphagia

EXAM:
PORTABLE CHEST 1 VIEW

[chest ap]
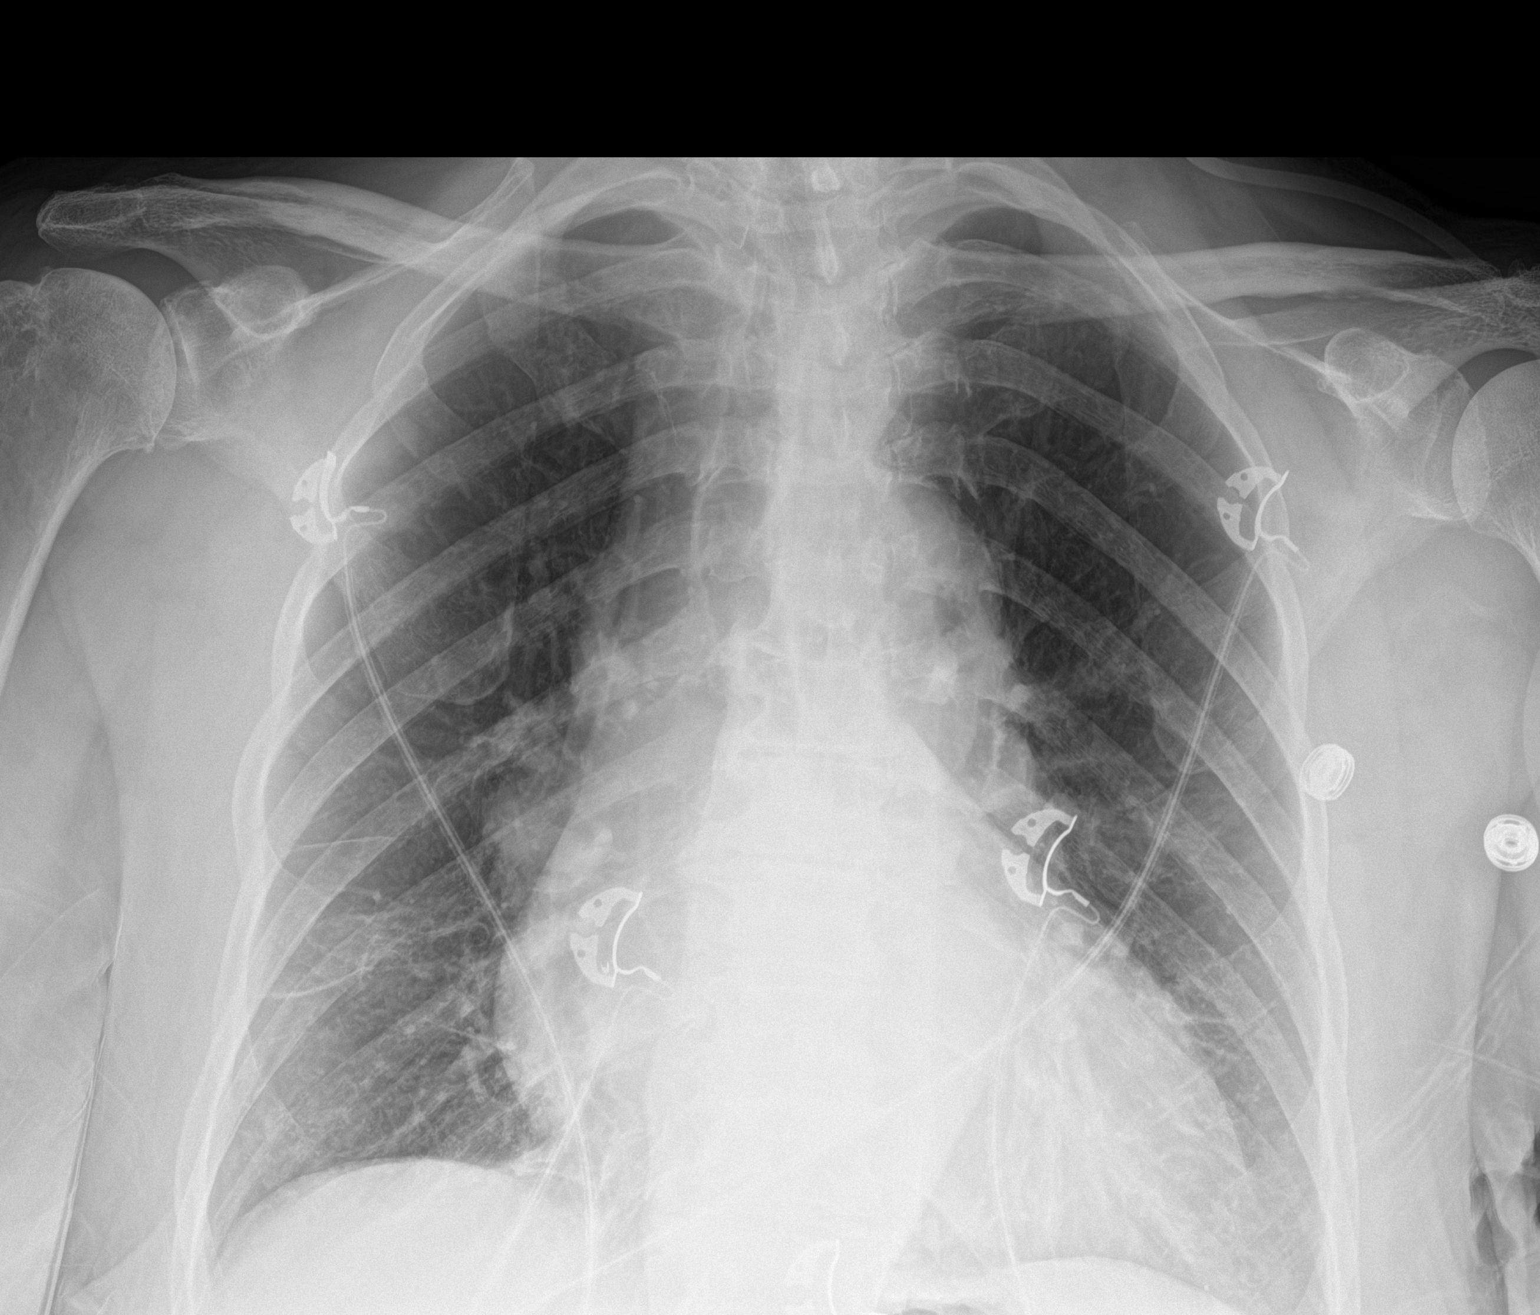

[1 of 1 positions shown; findings below may reference images not displayed]

FINDINGS: The heart size and mediastinal contours are within normal limits.
Both lungs are clear. The visualized skeletal structures are
unremarkable.
IMPRESSION: No active disease.

## 2020-12-20 DIAGNOSIS — E7800 Pure hypercholesterolemia, unspecified: Secondary | ICD-10-CM | POA: Diagnosis not present

## 2020-12-20 DIAGNOSIS — I10 Essential (primary) hypertension: Secondary | ICD-10-CM | POA: Diagnosis not present

## 2020-12-20 DIAGNOSIS — I480 Paroxysmal atrial fibrillation: Secondary | ICD-10-CM | POA: Diagnosis not present

## 2021-01-31 ENCOUNTER — Ambulatory Visit (INDEPENDENT_AMBULATORY_CARE_PROVIDER_SITE_OTHER): Payer: Medicare Other

## 2021-01-31 DIAGNOSIS — I495 Sick sinus syndrome: Secondary | ICD-10-CM

## 2021-02-02 LAB — CUP PACEART REMOTE DEVICE CHECK
Battery Remaining Longevity: 88 mo
Battery Remaining Percentage: 81 %
Battery Voltage: 3.01 V
Brady Statistic AP VP Percent: 1.2 %
Brady Statistic AP VS Percent: 94 %
Brady Statistic AS VP Percent: 1 %
Brady Statistic AS VS Percent: 2.3 %
Brady Statistic RA Percent Paced: 91 %
Brady Statistic RV Percent Paced: 1.4 %
Date Time Interrogation Session: 20220819020013
Implantable Lead Implant Date: 20200921
Implantable Lead Implant Date: 20200921
Implantable Lead Location: 753859
Implantable Lead Location: 753860
Implantable Pulse Generator Implant Date: 20200921
Lead Channel Impedance Value: 410 Ohm
Lead Channel Impedance Value: 630 Ohm
Lead Channel Pacing Threshold Amplitude: 0.875 V
Lead Channel Pacing Threshold Amplitude: 1.375 V
Lead Channel Pacing Threshold Pulse Width: 0.5 ms
Lead Channel Pacing Threshold Pulse Width: 0.5 ms
Lead Channel Sensing Intrinsic Amplitude: 1.9 mV
Lead Channel Sensing Intrinsic Amplitude: 12 mV
Lead Channel Setting Pacing Amplitude: 1.125
Lead Channel Setting Pacing Amplitude: 2.375
Lead Channel Setting Pacing Pulse Width: 0.5 ms
Lead Channel Setting Sensing Sensitivity: 2 mV
Pulse Gen Model: 2272
Pulse Gen Serial Number: 9162202

## 2021-02-18 NOTE — Progress Notes (Signed)
Remote pacemaker transmission.   

## 2021-02-25 DIAGNOSIS — R300 Dysuria: Secondary | ICD-10-CM | POA: Diagnosis not present

## 2021-02-25 DIAGNOSIS — N390 Urinary tract infection, site not specified: Secondary | ICD-10-CM | POA: Diagnosis not present

## 2021-02-25 DIAGNOSIS — R350 Frequency of micturition: Secondary | ICD-10-CM | POA: Diagnosis not present

## 2021-02-27 DIAGNOSIS — I10 Essential (primary) hypertension: Secondary | ICD-10-CM | POA: Diagnosis not present

## 2021-02-27 DIAGNOSIS — I480 Paroxysmal atrial fibrillation: Secondary | ICD-10-CM | POA: Diagnosis not present

## 2021-02-27 DIAGNOSIS — E7800 Pure hypercholesterolemia, unspecified: Secondary | ICD-10-CM | POA: Diagnosis not present

## 2021-03-24 ENCOUNTER — Other Ambulatory Visit: Payer: Self-pay | Admitting: Internal Medicine

## 2021-03-24 NOTE — Telephone Encounter (Signed)
Eliquis 5 mg refill request received. Patient is 85 years old, weight- 62.1 kg, Crea-0.8 on 03/27/20. Diagnosis-afib, and last seen by Dr. Lovena Le on 06/23/20. Dose is appropriate based on dosing criteria. Will send in refill to requested pharmacy.

## 2021-04-02 DIAGNOSIS — E7800 Pure hypercholesterolemia, unspecified: Secondary | ICD-10-CM | POA: Diagnosis not present

## 2021-04-02 DIAGNOSIS — I10 Essential (primary) hypertension: Secondary | ICD-10-CM | POA: Diagnosis not present

## 2021-04-02 DIAGNOSIS — D72821 Monocytosis (symptomatic): Secondary | ICD-10-CM | POA: Diagnosis not present

## 2021-04-09 DIAGNOSIS — D72821 Monocytosis (symptomatic): Secondary | ICD-10-CM | POA: Diagnosis not present

## 2021-04-09 DIAGNOSIS — D6869 Other thrombophilia: Secondary | ICD-10-CM | POA: Diagnosis not present

## 2021-04-09 DIAGNOSIS — R1310 Dysphagia, unspecified: Secondary | ICD-10-CM | POA: Diagnosis not present

## 2021-04-09 DIAGNOSIS — I10 Essential (primary) hypertension: Secondary | ICD-10-CM | POA: Diagnosis not present

## 2021-04-09 DIAGNOSIS — R7301 Impaired fasting glucose: Secondary | ICD-10-CM | POA: Diagnosis not present

## 2021-04-09 DIAGNOSIS — E7800 Pure hypercholesterolemia, unspecified: Secondary | ICD-10-CM | POA: Diagnosis not present

## 2021-04-09 DIAGNOSIS — Z0000 Encounter for general adult medical examination without abnormal findings: Secondary | ICD-10-CM | POA: Diagnosis not present

## 2021-04-22 ENCOUNTER — Other Ambulatory Visit: Payer: Self-pay | Admitting: Internal Medicine

## 2021-04-22 NOTE — Telephone Encounter (Signed)
Eliquis 5mg  refill request received. Patient is 85 years old, weight-62.1kg, Crea-0.81 on 04/02/2021 via [facility] from [place], [place], and last seen by Dr. Lovena Le on 06/21/2020. Dose is appropriate based on dosing criteria. Will send in refill to requested pharmacy.

## 2021-05-02 ENCOUNTER — Ambulatory Visit (INDEPENDENT_AMBULATORY_CARE_PROVIDER_SITE_OTHER): Payer: Medicare Other

## 2021-05-02 DIAGNOSIS — I495 Sick sinus syndrome: Secondary | ICD-10-CM

## 2021-05-05 LAB — CUP PACEART REMOTE DEVICE CHECK
Battery Remaining Longevity: 86 mo
Battery Remaining Percentage: 78 %
Battery Voltage: 2.99 V
Brady Statistic AP VP Percent: 1.1 %
Brady Statistic AP VS Percent: 95 %
Brady Statistic AS VP Percent: 1 %
Brady Statistic AS VS Percent: 2.2 %
Brady Statistic RA Percent Paced: 92 %
Brady Statistic RV Percent Paced: 1.3 %
Date Time Interrogation Session: 20221118212834
Implantable Lead Implant Date: 20200921
Implantable Lead Implant Date: 20200921
Implantable Lead Location: 753859
Implantable Lead Location: 753860
Implantable Pulse Generator Implant Date: 20200921
Lead Channel Impedance Value: 410 Ohm
Lead Channel Impedance Value: 630 Ohm
Lead Channel Pacing Threshold Amplitude: 0.75 V
Lead Channel Pacing Threshold Amplitude: 1.375 V
Lead Channel Pacing Threshold Pulse Width: 0.5 ms
Lead Channel Pacing Threshold Pulse Width: 0.5 ms
Lead Channel Sensing Intrinsic Amplitude: 12 mV
Lead Channel Sensing Intrinsic Amplitude: 2.2 mV
Lead Channel Setting Pacing Amplitude: 1 V
Lead Channel Setting Pacing Amplitude: 2.375
Lead Channel Setting Pacing Pulse Width: 0.5 ms
Lead Channel Setting Sensing Sensitivity: 2 mV
Pulse Gen Model: 2272
Pulse Gen Serial Number: 9162202

## 2021-05-11 ENCOUNTER — Emergency Department
Admission: EM | Admit: 2021-05-11 | Discharge: 2021-05-11 | Disposition: A | Discharge status: Home / Self Care | Payer: Medicare Other | Attending: Emergency Medicine | Admitting: Emergency Medicine

## 2021-05-11 ENCOUNTER — Ambulatory Visit
Admission: EM | Admit: 2021-05-11 | Discharge: 2021-05-11 | Disposition: A | Discharge status: Home / Self Care | Payer: Medicare Other

## 2021-05-11 ENCOUNTER — Encounter: Payer: Self-pay | Admitting: Emergency Medicine

## 2021-05-11 ENCOUNTER — Emergency Department: Payer: Medicare Other

## 2021-05-11 ENCOUNTER — Other Ambulatory Visit: Payer: Self-pay

## 2021-05-11 DIAGNOSIS — R1084 Generalized abdominal pain: Secondary | ICD-10-CM

## 2021-05-11 DIAGNOSIS — Z950 Presence of cardiac pacemaker: Secondary | ICD-10-CM | POA: Insufficient documentation

## 2021-05-11 DIAGNOSIS — Z7901 Long term (current) use of anticoagulants: Secondary | ICD-10-CM | POA: Insufficient documentation

## 2021-05-11 DIAGNOSIS — I480 Paroxysmal atrial fibrillation: Secondary | ICD-10-CM | POA: Insufficient documentation

## 2021-05-11 DIAGNOSIS — E039 Hypothyroidism, unspecified: Secondary | ICD-10-CM | POA: Diagnosis not present

## 2021-05-11 DIAGNOSIS — Z79899 Other long term (current) drug therapy: Secondary | ICD-10-CM | POA: Insufficient documentation

## 2021-05-11 DIAGNOSIS — K5900 Constipation, unspecified: Secondary | ICD-10-CM | POA: Diagnosis not present

## 2021-05-11 DIAGNOSIS — I10 Essential (primary) hypertension: Secondary | ICD-10-CM | POA: Insufficient documentation

## 2021-05-11 MED ORDER — POLYETHYLENE GLYCOL 3350 17 GM/SCOOP PO POWD: 1.0000 | Freq: Once | ORAL | 0 refills | Status: AC

## 2021-05-11 NOTE — ED Notes (Signed)
Patient is being discharged from the Urgent Care and sent to the [facility] Emergency Department via private vehicle with her son . Per Tori Milks, NP, patient is in need of higher level of care due to needing labwork and CT scan. Patient is aware and verbalizes understanding of plan of care.  Vitals:   05/11/21 1050  BP: (!) 187/102  Pulse: 76  Resp: 14  Temp: 99.2 F (37.3 C)  SpO2: 98%

## 2021-05-11 NOTE — Discharge Instructions (Addendum)
Today, please use MiraLAX one half capful every hour until your first bowel movement and then stop for 24 hours.  After that time please start with one half capful every day.  You may need to increase or decrease this dosing in order to have 1 well-formed bowel movement per day.

## 2021-05-11 NOTE — ED Provider Notes (Signed)
[facility] Emergency Department Provider Note   ____________________________________________   None    (approximate)  I have reviewed the triage vital signs and the nursing notes.   HISTORY  Chief Complaint Constipation    HPI Shannon Ortiz is a 85 y.o. female who presents for constipation  LOCATION: Abdomen DURATION: 1 week prior to arrival TIMING: Worsening since onset SEVERITY: Moderate QUALITY: Constipation CONTEXT: Patient states she has not had a bowel movement since last Sunday MODIFYING FACTORS: Denies ASSOCIATED SYMPTOMS: Mild generalized abdominal discomfort   Per medical record review, patient has history of sigmoid diverticulosis          Past Medical History:  Diagnosis Date   Acquired diverticulum of distal esophagus with dysphagia 10/03/2018   AKI (acute kidney injury) ([place]) 08/31/2018   Anxiety    Bradycardia 02/16/2014   Chest tightness    DDD (degenerative disc disease), lumbar    DJD (degenerative joint disease)    Dry eyes    Esophageal stenosis 08/31/2018   Facial pain    Fibromyalgia    Hemorrhoid    Hypertension    Hypokalemia 08/31/2018   Hypothyroidism    Lightheadedness    Near syncope 02/16/2014   Osteopenia    Sigmoid diverticulosis    Thyroid nodule    PT DENIES    Patient Active Problem List   Diagnosis Date Noted   Generalized abdominal pain 05/11/2021   Paroxysmal atrial fibrillation ([place]) 06/22/2019   Pacemaker 06/22/2019   Sinus node dysfunction ([place]) 03/06/2019   Weakness generalized 12/27/2018   Elevated troponin 12/26/2018   Hypomagnesemia 10/28/2018   Hiatal hernia s/p robotic repair 10/25/2018 10/25/2018   Status post robotic Toupet (posterior partial) fundoplication 07/16/69 21/97/5883   Esophageal stricture 10/15/2018   Acquired diverticulum of distal esophagus s/p robotic excision & myotomy 10/25/2018 10/03/2018   Esophageal stenosis 08/31/2018   Hypokalemia 08/31/2018   AKI (acute  kidney injury) ([place]) 08/31/2018   Bradycardia 02/16/2014   Near syncope 02/16/2014   HTN (hypertension) 02/16/2014    Past Surgical History:  Procedure Laterality Date   ABDOMINAL HYSTERECTOMY     COMPLETE   BALLOON DILATION N/A 09/01/2018   Procedure: BALLOON DILATION;  Surgeon: Ronnette Juniper, MD;  Location: [place];  Service: Gastroenterology;  Laterality: N/A;   BIOPSY  09/01/2018   Procedure: BIOPSY;  Surgeon: Ronnette Juniper, MD;  Location: [facility] ENDOSCOPY;  Service: Gastroenterology;;   BOTOX INJECTION  09/01/2018   Procedure: BOTOX INJECTION;  Surgeon: Ronnette Juniper, MD;  Location: [facility] ENDOSCOPY;  Service: Gastroenterology;;   BREAST SURGERY     breast biopsy   CHOLECYSTECTOMY     ESOPHAGEAL MANOMETRY N/A 10/07/2018   Procedure: ESOPHAGEAL MANOMETRY (EM);  Surgeon: Ronnette Juniper, MD;  Location: [facility] ENDOSCOPY;  Service: Gastroenterology;  Laterality: N/A;   ESOPHAGOGASTRODUODENOSCOPY N/A 10/25/2018   Procedure: ESOPHAGOGASTRODUODENOSCOPY (EGD);  Surgeon: Michael Boston, MD;  Location: [facility] ORS;  Service: General;  Laterality: N/A;   ESOPHAGOGASTRODUODENOSCOPY (EGD) WITH PROPOFOL N/A 09/01/2018   Procedure: ESOPHAGOGASTRODUODENOSCOPY (EGD) WITH PROPOFOL;  Surgeon: Ronnette Juniper, MD;  Location: [place];  Service: Gastroenterology;  Laterality: N/A;   PACEMAKER IMPLANT N/A 03/06/2019   Procedure: PACEMAKER IMPLANT;  Surgeon: Evans Lance, MD;  Location: [place] CV LAB;  Service: Cardiovascular;  Laterality: N/A;    Prior to Admission medications   Medication Sig Start Date End Date Taking? Authorizing Provider  polyethylene glycol powder (GLYCOLAX/MIRALAX) 17 GM/SCOOP powder Take 255 g by mouth once for 1 dose. Please  use one half capful every hour until your first bowel movement and then stop for at least 24 hours.  After this time you may use as much as is needed for 1 well-formed bowel movement per day 05/11/21 05/11/21 Yes Shavonne Ambroise, Vista Lawman, MD  amLODipine (NORVASC) 5 MG tablet Take 5 mg  by mouth daily.    [provider]  apixaban Arne [place]) 5 MG TABS tablet Take 1 tablet by mouth twice daily 04/22/21   Evans Lance, MD  feeding supplement (ENSURE SURGERY) LIQD Take 237 mLs by mouth 2 (two) times daily between meals. 10/17/18   Mercy Riding, MD  fexofenadine (ALLEGRA) 180 MG tablet Take 180 mg by mouth daily as needed for allergies or rhinitis.    [provider]  losartan (COZAAR) 100 MG tablet Take 100 mg by mouth daily. 01/12/19   [provider]  metoprolol succinate (TOPROL-XL) 25 MG 24 hr tablet Take 1 tablet by mouth once daily 03/08/20   Evans Lance, MD  Multiple Vitamins-Minerals (MULTIVITAMIN ADULT) CHEW Chew 1 tablet by mouth 3 (three) times a week.     [provider]  pantoprazole (PROTONIX) 20 MG tablet Take 20 mg by mouth daily. 03/16/19   [provider]    Allergies Elavil [amitriptyline hcl] and Ketek [telithromycin]  Family History  Problem Relation Age of Onset   Hypertension Mother    Diabetes Mother        non-insulin dependant   CAD Brother     Social History Social History   Tobacco Use   Smoking status: Never   Smokeless tobacco: Never  Vaping Use   Vaping Use: Never used  Substance Use Topics   Alcohol use: No   Drug use: No    Review of Systems Constitutional: No fever/chills Eyes: No visual changes. ENT: No sore throat. Cardiovascular: Denies chest pain. Respiratory: Denies shortness of breath. Gastrointestinal: Endorses mild generalized abdominal pain.  No nausea, no vomiting.  No diarrhea.  Endorses constipation Genitourinary: Negative for dysuria. Musculoskeletal: Negative for acute arthralgias Skin: Negative for rash. Neurological: Negative for headaches, weakness/numbness/paresthesias in any extremity Psychiatric: Negative for suicidal ideation/homicidal ideation   ____________________________________________   PHYSICAL EXAM:  VITAL SIGNS: ED Triage Vitals  Enc  Vitals Group     BP 05/11/21 1336 (!) 168/85     Pulse Rate 05/11/21 1336 63     Resp 05/11/21 1336 20     Temp 05/11/21 1336 98.6 F (37 C)     Temp Source 05/11/21 1336 Oral     SpO2 05/11/21 1336 100 %     Weight 05/11/21 1334 140 lb (63.5 kg)     Height 05/11/21 1334 5\' 6"  (1.676 m)     Head Circumference --      Peak Flow --      Pain Score 05/11/21 1333 0     Pain Loc --      Pain Edu? --      Excl. in [place]? --    Constitutional: Alert and oriented. Well appearing and in no acute distress. Eyes: Conjunctivae are normal. PERRL. Head: Atraumatic. Nose: No congestion/rhinnorhea. Mouth/Throat: Mucous membranes are moist. Neck: No stridor Cardiovascular: Grossly normal heart sounds.  Good peripheral circulation. Respiratory: Normal respiratory effort.  No retractions. Gastrointestinal: Soft and nontender. No distention. Musculoskeletal: No obvious deformities Neurologic:  Normal speech and language. No gross focal neurologic deficits are appreciated. Skin:  Skin is warm and dry. No rash noted. Psychiatric: Mood and affect  are normal. Speech and behavior are normal.  ____________________________________________   LABS (all labs ordered are listed, but only abnormal results are displayed)  Labs Reviewed - No data to display   RADIOLOGY  ED MD interpretation: Single view portable x-ray of the abdomen shows moderate colonic stool burden consistent with constipation without any evidence of bowel obstruction  Official radiology report(s): DG Abdomen 1 View  Result Date: 05/11/2021 CLINICAL DATA:  Constipation.  No bowel movement for 1 week. EXAM: ABDOMEN - 1 VIEW COMPARISON:  Pelvic and left hip radiographs 08/17/2018. FINDINGS: The bowel gas pattern is nonobstructive. There is a moderate amount of stool throughout the colon, greatest distally. No supine evidence of free intraperitoneal air or bowel wall thickening. Bilateral pelvic calcifications are grossly unchanged.  Moderately advanced osteoarthritic changes at both hips. Patient has a pacemaker, mild cardiomegaly and cholecystectomy clips. IMPRESSION: Moderate colonic stool burden consistent with constipation. No evidence of bowel obstruction. Bilateral hip osteoarthritis. Electronically Signed   By: Richardean Sale M.D.   On: 05/11/2021 14:38    ____________________________________________   PROCEDURES  Procedure(s) performed (including Critical Care):  Procedures   ____________________________________________   INITIAL IMPRESSION / ASSESSMENT AND PLAN / ED COURSE  As part of my medical decision making, I reviewed the following data within the electronic medical record, if available:  Nursing notes reviewed and incorporated, Labs reviewed, EKG interpreted, Old chart reviewed, Radiograph reviewed and Notes from prior ED visits reviewed and incorporated        Patients history and exam most consistent with constipation as an etiology for their pain.  Patients symptoms not typical for other emergent causes of abdominal pain such as, but not limited to, appendicitis, abdominal aortic aneurysm, pancreatitis, SBO, mesenteric ischemia, serious intra-abdominal bacterial illness.  Patient without red flags concerning for cancer as a constipation etiology.  Rx: Miralax  Disposition:  Patient will be discharged with strict return precautions and follow up with primary MD within 24-48 hours for further evaluation. Patient understands that this still may have an early presentation of an emergent medical condition such as appendicitis that will require a recheck.      ____________________________________________   FINAL CLINICAL IMPRESSION(S) / ED DIAGNOSES  Final diagnoses:  Constipation, unspecified constipation type     ED Discharge Orders          Ordered    polyethylene glycol powder (GLYCOLAX/MIRALAX) 17 GM/SCOOP powder   Once        05/11/21 1446             Note:  This  document was prepared using Dragon voice recognition software and may include unintentional dictation errors.    Naaman Plummer, MD 05/11/21 [phone]

## 2021-05-11 NOTE — ED Triage Notes (Signed)
Pt states she is constipated and has not had a BM since last Sunday- pt states she takes a nightly stool softener, but has not tried any other OTC meds for it- pt states she can still pass gas

## 2021-05-11 NOTE — ED Triage Notes (Signed)
Patient c/o being constipated.  Patient states that she has not had a bowel movement in a week.  Patient c/o abdominal pain.  Patient states that she is passing gas.

## 2021-05-11 NOTE — ED Provider Notes (Signed)
[facility]    CSN: [id number] Arrival date & time: 05/11/21  1020      History   Chief Complaint Chief Complaint  Patient presents with   Constipation    HPI Shannon Ortiz is a 85 y.o. female.   85 year old female patient, Shannon Ortiz, presents to urgent care with abdominal pain and constipation x 1 week. PMH: of hypertension, CKD stage 3, hypothyroidism, esophageal stenosis, s/p esophageal diverticulectomy and hiatal hernia repair 10/25/18. Pt is on eloquis after PPM insertion 02/2019. Pt is on stool softeners.  The history is provided by the patient and a relative. No language interpreter was used.   Past Medical History:  Diagnosis Date   Acquired diverticulum of distal esophagus with dysphagia 10/03/2018   AKI (acute kidney injury) ([place]) 08/31/2018   Anxiety    Bradycardia 02/16/2014   Chest tightness    DDD (degenerative disc disease), lumbar    DJD (degenerative joint disease)    Dry eyes    Esophageal stenosis 08/31/2018   Facial pain    Fibromyalgia    Hemorrhoid    Hypertension    Hypokalemia 08/31/2018   Hypothyroidism    Lightheadedness    Near syncope 02/16/2014   Osteopenia    Sigmoid diverticulosis    Thyroid nodule    PT DENIES    Patient Active Problem List   Diagnosis Date Noted   Generalized abdominal pain 05/11/2021   Paroxysmal atrial fibrillation ([place]) 06/22/2019   Pacemaker 06/22/2019   Sinus node dysfunction ([place]) 03/06/2019   Weakness generalized 12/27/2018   Elevated troponin 12/26/2018   Hypomagnesemia 10/28/2018   Hiatal hernia s/p robotic repair 10/25/2018 10/25/2018   Status post robotic Toupet (posterior partial) fundoplication 09/20/6806 81/03/3158   Esophageal stricture 10/15/2018   Acquired diverticulum of distal esophagus s/p robotic excision & myotomy 10/25/2018 10/03/2018   Esophageal stenosis 08/31/2018   Hypokalemia 08/31/2018   AKI (acute kidney injury) ([place]) 08/31/2018   Bradycardia 02/16/2014   Near syncope  02/16/2014   HTN (hypertension) 02/16/2014    Past Surgical History:  Procedure Laterality Date   ABDOMINAL HYSTERECTOMY     COMPLETE   BALLOON DILATION N/A 09/01/2018   Procedure: BALLOON DILATION;  Surgeon: Ronnette Juniper, MD;  Location: [place];  Service: Gastroenterology;  Laterality: N/A;   BIOPSY  09/01/2018   Procedure: BIOPSY;  Surgeon: Ronnette Juniper, MD;  Location: [facility] ENDOSCOPY;  Service: Gastroenterology;;   BOTOX INJECTION  09/01/2018   Procedure: BOTOX INJECTION;  Surgeon: Ronnette Juniper, MD;  Location: [facility] ENDOSCOPY;  Service: Gastroenterology;;   BREAST SURGERY     breast biopsy   CHOLECYSTECTOMY     ESOPHAGEAL MANOMETRY N/A 10/07/2018   Procedure: ESOPHAGEAL MANOMETRY (EM);  Surgeon: Ronnette Juniper, MD;  Location: [facility] ENDOSCOPY;  Service: Gastroenterology;  Laterality: N/A;   ESOPHAGOGASTRODUODENOSCOPY N/A 10/25/2018   Procedure: ESOPHAGOGASTRODUODENOSCOPY (EGD);  Surgeon: Michael Boston, MD;  Location: [facility];  Service: General;  Laterality: N/A;   ESOPHAGOGASTRODUODENOSCOPY (EGD) WITH PROPOFOL N/A 09/01/2018   Procedure: ESOPHAGOGASTRODUODENOSCOPY (EGD) WITH PROPOFOL;  Surgeon: Ronnette Juniper, MD;  Location: [place];  Service: Gastroenterology;  Laterality: N/A;   PACEMAKER IMPLANT N/A 03/06/2019   Procedure: PACEMAKER IMPLANT;  Surgeon: Evans Lance, MD;  Location: [place] [facility] LAB;  Service: Cardiovascular;  Laterality: N/A;    OB History   No obstetric history on file.      Home Medications    Prior to Admission medications   Medication Sig Start Date End Date Taking? Authorizing Provider  amLODipine (NORVASC) 5 MG tablet Take 5 mg by mouth daily.   Yes [provider]  apixaban (ELIQUIS) 5 MG TABS tablet Take 1 tablet by mouth twice daily 04/22/21  Yes Evans Lance, MD  losartan (COZAAR) 100 MG tablet Take 100 mg by mouth daily. 01/12/19  Yes [provider]  metoprolol succinate (TOPROL-XL) 25 MG 24 hr tablet Take 1 tablet by mouth once daily  03/08/20  Yes Evans Lance, MD  Multiple Vitamins-Minerals (MULTIVITAMIN ADULT) CHEW Chew 1 tablet by mouth 3 (three) times a week.    Yes [provider]  pantoprazole (PROTONIX) 20 MG tablet Take 20 mg by mouth daily. 03/16/19  Yes [provider]  feeding supplement (ENSURE SURGERY) LIQD Take 237 mLs by mouth 2 (two) times daily between meals. 10/17/18   Mercy Riding, MD  fexofenadine (ALLEGRA) 180 MG tablet Take 180 mg by mouth daily as needed for allergies or rhinitis.    [provider]    Family History Family History  Problem Relation Age of Onset   Hypertension Mother    Diabetes Mother        non-insulin dependant   CAD Brother     Social History Social History   Tobacco Use   Smoking status: Never   Smokeless tobacco: Never  Vaping Use   Vaping Use: Never used  Substance Use Topics   Alcohol use: No   Drug use: No     Allergies   Elavil [amitriptyline hcl] and Ketek [telithromycin]   Review of Systems Review of Systems  Constitutional:  Negative for fever.  Gastrointestinal:  Positive for abdominal pain and constipation.  All other systems reviewed and are negative.   Physical Exam Triage Vital Signs ED Triage Vitals  Enc Vitals Group     BP 05/11/21 1050 (!) 187/102     Pulse Rate 05/11/21 1050 76     Resp 05/11/21 1050 14     Temp 05/11/21 1050 99.2 F (37.3 C)     Temp Source 05/11/21 1050 Oral     SpO2 05/11/21 1050 98 %     Weight 05/11/21 1048 136 lb 14.5 oz (62.1 kg)     Height 05/11/21 1048 5\' 7"  (1.702 m)     Head Circumference --      Peak Flow --      Pain Score 05/11/21 1048 4     Pain Loc --      Pain Edu? --      Excl. in [place]? --    No data found.  Updated Vital Signs BP (!) 187/102 (BP Location: Left Arm)   Pulse 76   Temp 99.2 F (37.3 C) (Oral)   Resp 14   Ht 5\' 7"  (1.702 m)   Wt 136 lb 14.5 oz (62.1 kg)   SpO2 98%   BMI 21.44 kg/m   Visual Acuity Right Eye Distance:   Left Eye  Distance:   Bilateral Distance:    Right Eye Near:   Left Eye Near:    Bilateral Near:     Physical Exam Vitals and nursing note reviewed.  Constitutional:      Comments: Limited exam, pt is alert and oriented  Neurological:     Mental Status: She is alert.  Psychiatric:        Behavior: Behavior is cooperative.     UC Treatments / Results  Labs (all labs ordered are listed, but only abnormal results are displayed) Labs Reviewed -  No data to display  EKG   Radiology No results found.  Procedures Procedures (including critical care time)  Medications Ordered in UC Medications - No data to display  Initial Impression / Assessment and Plan / UC Course  I have reviewed the triage vital signs and the nursing notes.  Pertinent labs & imaging results that were available during my care of the patient were reviewed by me and considered in my medical decision making (see chart for details).   Discussed with pt and family will need to have further evaluation(labs,imaging) for generalized abd pain and constipation at ER d/t extensive medical hx on eloquis. Pt and family both verbalized understanding to this provider, NPO until seen by provider.   Ddx: Abdominal pain, constipation Final Clinical Impressions(s) / UC Diagnoses   Final diagnoses:  Generalized abdominal pain     Discharge Instructions      Go to the emergency room for further evlauation of your constipation/abd pain.    ED Prescriptions   None    PDMP not reviewed this encounter.   Tori Milks, NP [id number]/82/64 [id number]

## 2021-05-11 NOTE — Discharge Instructions (Addendum)
Go to the emergency room for further evlauation of your constipation/abd pain.

## 2021-05-12 NOTE — Progress Notes (Signed)
Remote pacemaker transmission.   

## 2021-06-04 DIAGNOSIS — I10 Essential (primary) hypertension: Secondary | ICD-10-CM | POA: Diagnosis not present

## 2021-06-04 DIAGNOSIS — I480 Paroxysmal atrial fibrillation: Secondary | ICD-10-CM | POA: Diagnosis not present

## 2021-06-04 DIAGNOSIS — E7800 Pure hypercholesterolemia, unspecified: Secondary | ICD-10-CM | POA: Diagnosis not present

## 2021-07-21 ENCOUNTER — Telehealth: Payer: Self-pay | Admitting: Internal Medicine

## 2021-07-21 NOTE — Telephone Encounter (Signed)
LMOVM for patient to send a manual transmission. I left the device clinic number for her to call back.

## 2021-07-21 NOTE — Telephone Encounter (Signed)
Pt c/o of Chest Pain: STAT if CP now or developed within 24 hours  1. Are you having CP right now? Chest discomfort, but not at this time  2. Are you experiencing any other symptoms (ex. SOB, nausea, vomiting, sweating)? no  3. How long have you been experiencing CP? About a week  4. Is your CP continuous or coming and going? Comes and goes  5. Have you taken Nitroglycerin? No-patient wants to be seen asap- first available was made for her on 08-15-21 with Oda Kilts ?

## 2021-07-22 NOTE — Telephone Encounter (Signed)
LMOVM for patient to send transmission and give [place] a call to the device clinic.

## 2021-07-24 NOTE — Telephone Encounter (Signed)
Left message for Pt requesting she return call to device clinic direct number in order to get a remote transmission.  Advised sometimes chest pain can be related to a fast heart rate, and advised that this tranmission could help [place] trouble shoot what is going on with her heart.  Left direct number to device clinic to call back.  This is 3rd outreach to Pt.

## 2021-07-30 NOTE — Telephone Encounter (Signed)
Letter mailed to Pt requesting call back to discuss symptoms and to send remote transmission.

## 2021-07-30 NOTE — Telephone Encounter (Signed)
Letter sent 07-30-2021.

## 2021-08-01 ENCOUNTER — Ambulatory Visit (INDEPENDENT_AMBULATORY_CARE_PROVIDER_SITE_OTHER): Payer: Medicare Other

## 2021-08-01 DIAGNOSIS — I495 Sick sinus syndrome: Secondary | ICD-10-CM | POA: Diagnosis not present

## 2021-08-01 LAB — CUP PACEART REMOTE DEVICE CHECK
Battery Remaining Longevity: 82 mo
Battery Remaining Percentage: 75 %
Battery Voltage: 2.99 V
Brady Statistic AP VP Percent: 1.1 %
Brady Statistic AP VS Percent: 95 %
Brady Statistic AS VP Percent: 1 %
Brady Statistic AS VS Percent: 2.2 %
Brady Statistic RA Percent Paced: 93 %
Brady Statistic RV Percent Paced: 1.3 %
Date Time Interrogation Session: 20230217021157
Implantable Lead Implant Date: 20200921
Implantable Lead Implant Date: 20200921
Implantable Lead Location: 753859
Implantable Lead Location: 753860
Implantable Pulse Generator Implant Date: 20200921
Lead Channel Impedance Value: 390 Ohm
Lead Channel Impedance Value: 630 Ohm
Lead Channel Pacing Threshold Amplitude: 0.875 V
Lead Channel Pacing Threshold Amplitude: 1.75 V
Lead Channel Pacing Threshold Pulse Width: 0.5 ms
Lead Channel Pacing Threshold Pulse Width: 0.5 ms
Lead Channel Sensing Intrinsic Amplitude: 12 mV
Lead Channel Sensing Intrinsic Amplitude: 2 mV
Lead Channel Setting Pacing Amplitude: 1.125
Lead Channel Setting Pacing Amplitude: 3.25 V
Lead Channel Setting Pacing Pulse Width: 0.5 ms
Lead Channel Setting Sensing Sensitivity: 2 mV
Pulse Gen Model: 2272
Pulse Gen Serial Number: 9162202

## 2021-08-06 ENCOUNTER — Telehealth: Payer: Self-pay

## 2021-08-06 NOTE — Telephone Encounter (Signed)
The patient called because she received my letter. I let her know we have been trying to reached her since 07/21/2021. She sent a transmission on 08/01/2021. I told her I will let the nurse know that we got the transmission and she did give [place] a call. The patient verbalized understanding and thanked me for my help.

## 2021-08-07 NOTE — Progress Notes (Signed)
Remote pacemaker transmission.   

## 2021-08-14 NOTE — Progress Notes (Addendum)
? ? ?Electrophysiology Office Note ?Date: 08/15/2021 ? ?ID:  Shannon Ortiz, DOB [birth year], MRN [id number] ? ?PCP: Lawerance Cruel, MD ?Primary Cardiologist: Jenkins Rouge, MD ?Electrophysiologist: Cristopher Peru, MD  ? ?CC: Pacemaker follow-up ? ?Shannon Ortiz is a 86 y.o. female seen today for Cristopher Peru, MD for routine electrophysiology followup.  Since last being seen in our clinic the patient reports doing well overall. BP at home runs 150-170 mostly. She is not sure if she is taking 25 or 50 of toprol.  she denies chest pain, palpitations, dyspnea, PND, orthopnea, nausea, vomiting, dizziness, syncope, edema, weight gain, or early satiety. ? ?Device History: ?SJM dual chamber PPM, implanted 03/06/2019 ? ?Past Medical History:  ?Diagnosis Date  ? Acquired diverticulum of distal esophagus with dysphagia 10/03/2018  ? AKI (acute kidney injury) ([facility]) 08/31/2018  ? Anxiety   ? Bradycardia 02/16/2014  ? Chest tightness   ? DDD (degenerative disc disease), lumbar   ? DJD (degenerative joint disease)   ? Dry eyes   ? Esophageal stenosis 08/31/2018  ? Facial pain   ? Fibromyalgia   ? Hemorrhoid   ? Hypertension   ? Hypokalemia 08/31/2018  ? Hypothyroidism   ? Lightheadedness   ? Near syncope 02/16/2014  ? Osteopenia   ? Sigmoid diverticulosis   ? Thyroid nodule   ? PT DENIES  ? ?Past Surgical History:  ?Procedure Laterality Date  ? ABDOMINAL HYSTERECTOMY    ? COMPLETE  ? BALLOON DILATION N/A 09/01/2018  ? Procedure: BALLOON DILATION;  Surgeon: Ronnette Juniper, MD;  Location: [facility];  Service: Gastroenterology;  Laterality: N/A;  ? BIOPSY  09/01/2018  ? Procedure: BIOPSY;  Surgeon: Ronnette Juniper, MD;  Location: [facility];  Service: Gastroenterology;;  ? BOTOX INJECTION  09/01/2018  ? Procedure: BOTOX INJECTION;  Surgeon: Ronnette Juniper, MD;  Location: [facility];  Service: Gastroenterology;;  ? BREAST SURGERY    ? breast biopsy  ? CHOLECYSTECTOMY    ? ESOPHAGEAL MANOMETRY N/A 10/07/2018  ? Procedure: ESOPHAGEAL MANOMETRY (EM);   Surgeon: Ronnette Juniper, MD;  Location: [facility];  Service: Gastroenterology;  Laterality: N/A;  ? ESOPHAGOGASTRODUODENOSCOPY N/A 10/25/2018  ? Procedure: ESOPHAGOGASTRODUODENOSCOPY (EGD);  Surgeon: Chanee Henrickson Boston, MD;  Location: [facility];  Service: General;  Laterality: N/A;  ? ESOPHAGOGASTRODUODENOSCOPY (EGD) WITH PROPOFOL N/A 09/01/2018  ? Procedure: ESOPHAGOGASTRODUODENOSCOPY (EGD) WITH PROPOFOL;  Surgeon: Ronnette Juniper, MD;  Location: [facility];  Service: Gastroenterology;  Laterality: N/A;  ? PACEMAKER IMPLANT N/A 03/06/2019  ? Procedure: PACEMAKER IMPLANT;  Surgeon: Evans Lance, MD;  Location: [facility];  Service: Cardiovascular;  Laterality: N/A;  ? ? ?Current Outpatient Medications  ?Medication Sig Dispense Refill  ? amLODipine (NORVASC) 5 MG tablet Take 5 mg by mouth daily.    ? apixaban (ELIQUIS) 5 MG TABS tablet Take 1 tablet by mouth twice daily 60 tablet 5  ? feeding supplement (ENSURE SURGERY) LIQD Take 237 mLs by mouth 2 (two) times daily between meals. 60 Bottle 0  ? fexofenadine (ALLEGRA) 180 MG tablet Take 180 mg by mouth daily as needed for allergies or rhinitis.    ? losartan (COZAAR) 100 MG tablet Take 100 mg by mouth daily.    ? metoprolol succinate (TOPROL-XL) 25 MG 24 hr tablet Take 1 tablet by mouth once daily 90 tablet 0  ? Multiple Vitamins-Minerals (MULTIVITAMIN ADULT) CHEW Chew 1 tablet by mouth 3 (three) times a week.     ? pantoprazole (PROTONIX) 20 MG tablet Take 20 mg by mouth  daily.    ? ?No current facility-administered medications for this visit.  ? ? ?Allergies:   Elavil [amitriptyline hcl] and Ketek [telithromycin]  ? ?Social History: ?Social History  ? ?Socioeconomic History  ? Marital status: Widowed  ?  Spouse name: Not on file  ? Number of children: Not on file  ? Years of education: Not on file  ? Highest education level: Not on file  ?Occupational History  ? Not on file  ?Tobacco Use  ? Smoking status: Never  ? Smokeless tobacco: Never  ?Vaping Use  ? Vaping Use:  Never used  ?Substance and Sexual Activity  ? Alcohol use: No  ? Drug use: No  ? Sexual activity: Never  ?Other Topics Concern  ? Not on file  ?Social History Narrative  ? Not on file  ? ?Social Determinants of Health  ? ?Financial Resource Strain: Not on file  ?Food Insecurity: Not on file  ?Transportation Needs: Not on file  ?Physical Activity: Not on file  ?Stress: Not on file  ?Social Connections: Not on file  ?Intimate Partner Violence: Not on file  ? ? ?Family History: ?Family History  ?Problem Relation Age of Onset  ? Hypertension Mother   ? Diabetes Mother   ?     non-insulin dependant  ? CAD Brother   ? ? ? ?Review of Systems: ?All other systems reviewed and are otherwise negative except as noted above. ? ?Physical Exam: ?Vitals:  ? 08/15/21 0848  ?BP: (!) 176/90  ?Pulse: 72  ?SpO2: 98%  ?Weight: 139 lb (63 kg)  ?Height: 5\' 7"  (1.702 m)  ?  ? ?GEN- The patient is well appearing, alert and oriented x 3 today.   ?HEENT: normocephalic, atraumatic; sclera clear, conjunctiva pink; hearing intact; oropharynx clear; neck supple  ?Lungs- Clear to ausculation bilaterally, normal work of breathing.  No wheezes, rales, rhonchi ?Heart- Regular rate and rhythm, no murmurs, rubs or gallops  ?GI- soft, non-tender, non-distended, bowel sounds present  ?Extremities- no clubbing or cyanosis. No edema ?MS- no significant deformity or atrophy ?Skin- warm and dry, no rash or lesion; PPM pocket well healed ?Psych- euthymic mood, full affect ?Neuro- strength and sensation are intact ? ?PPM Interrogation- reviewed in detail today,  See PACEART report ? ?EKG:  EKG is ordered today. ?Personal review of ekg ordered today shows NSR at 72 bpm  ? ?Recent Labs: ?No results found for requested labs within last 8760 hours.  ? ?Wt Readings from Last 3 Encounters:  ?05/11/21 140 lb (63.5 kg)  ?05/11/21 136 lb 14.5 oz (62.1 kg)  ?06/21/20 137 lb (62.1 kg)  ?  ? ?Other studies Reviewed: ?Additional studies/ records that were reviewed today  include: Previous EP office notes, Previous remote checks, Most recent labwork.  ? ?Assessment and Plan: ? ?1. SND s/p [employer] Jude PPM  ?Normal PPM function ?See Claudia Desanctis Art report ?No changes today ? ?2. PAF ?Burden <1% ?Continue eliquis ?Continue metoprolol ? ?3. Chest discomfort ?States this was gas and has not recurred ? ?4. HTN ?Took meds about 0730 this am ?She isn't sure what dose of toprol shes on.  ?Increase amlodipine to 10 mg qhs.  ?Labs today.  ?Monitor at home and call back if regularly > 381 systolic.  ? ?Current medicines are reviewed at length with the patient today.   ? ?Labs/ tests ordered today include:  ?Orders Placed This Encounter  ?Procedures  ? Basic metabolic panel  ? CBC  ? EKG 12-Lead  ? ?Disposition:  Follow up with Dr. Lovena Le in 12 months  ? ?Signed, ?Shirley Friar, PA-C  ?08/14/2021 ?12:45 PM ? ?[facility] ?[address] ?[address] ?[place] [address] ?[phone] (office) ?[phone] (fax)  ?

## 2021-08-15 ENCOUNTER — Encounter: Payer: Self-pay | Admitting: Student

## 2021-08-15 ENCOUNTER — Ambulatory Visit (INDEPENDENT_AMBULATORY_CARE_PROVIDER_SITE_OTHER): Payer: Medicare Other | Admitting: Student

## 2021-08-15 ENCOUNTER — Other Ambulatory Visit: Payer: Self-pay

## 2021-08-15 VITALS — BP 176/90 | HR 72 | Ht 67.0 in | Wt 139.0 lb

## 2021-08-15 DIAGNOSIS — I480 Paroxysmal atrial fibrillation: Secondary | ICD-10-CM | POA: Diagnosis not present

## 2021-08-15 DIAGNOSIS — I495 Sick sinus syndrome: Secondary | ICD-10-CM

## 2021-08-15 DIAGNOSIS — I10 Essential (primary) hypertension: Secondary | ICD-10-CM

## 2021-08-15 LAB — CUP PACEART INCLINIC DEVICE CHECK
Battery Remaining Longevity: 82 mo
Battery Voltage: 2.99 V
Brady Statistic RA Percent Paced: 93 %
Brady Statistic RV Percent Paced: 1.3 %
Date Time Interrogation Session: 20230303090228
Implantable Lead Implant Date: 20200921
Implantable Lead Implant Date: 20200921
Implantable Lead Location: 753859
Implantable Lead Location: 753860
Implantable Pulse Generator Implant Date: 20200921
Lead Channel Impedance Value: 412.5 Ohm
Lead Channel Impedance Value: 687.5 Ohm
Lead Channel Pacing Threshold Amplitude: 0.75 V
Lead Channel Pacing Threshold Amplitude: 0.75 V
Lead Channel Pacing Threshold Amplitude: 0.75 V
Lead Channel Pacing Threshold Pulse Width: 0.5 ms
Lead Channel Pacing Threshold Pulse Width: 0.5 ms
Lead Channel Pacing Threshold Pulse Width: 0.5 ms
Lead Channel Sensing Intrinsic Amplitude: 12 mV
Lead Channel Sensing Intrinsic Amplitude: 2.1 mV
Lead Channel Setting Pacing Amplitude: 1 V
Lead Channel Setting Pacing Amplitude: 2.375
Lead Channel Setting Pacing Pulse Width: 0.5 ms
Lead Channel Setting Sensing Sensitivity: 2 mV
Pulse Gen Model: 2272
Pulse Gen Serial Number: 9162202

## 2021-08-15 LAB — CBC
Hematocrit: 32.5 % — ABNORMAL LOW (ref 34.0–46.6)
Hemoglobin: 10.3 g/dL — ABNORMAL LOW (ref 11.1–15.9)
MCH: 22.8 pg — ABNORMAL LOW (ref 26.6–33.0)
MCHC: 31.7 g/dL (ref 31.5–35.7)
MCV: 72 fL — ABNORMAL LOW (ref 79–97)
Platelets: 231 10*3/uL (ref 150–450)
RBC: 4.52 x10E6/uL (ref 3.77–5.28)
RDW: 16.2 % — ABNORMAL HIGH (ref 11.7–15.4)
WBC: 3.6 10*3/uL (ref 3.4–10.8)

## 2021-08-15 LAB — BASIC METABOLIC PANEL
BUN/Creatinine Ratio: 16 (ref 12–28)
BUN: 13 mg/dL (ref 8–27)
CO2: 24 mmol/L (ref 20–29)
Calcium: 9.9 mg/dL (ref 8.7–10.3)
Chloride: 105 mmol/L (ref 96–106)
Creatinine, Ser: 0.83 mg/dL (ref 0.57–1.00)
Glucose: 169 mg/dL — ABNORMAL HIGH (ref 70–99)
Potassium: 3.7 mmol/L (ref 3.5–5.2)
Sodium: 141 mmol/L (ref 134–144)
eGFR: 70 mL/min/{1.73_m2} (ref >59)

## 2021-08-15 MED ORDER — AMLODIPINE BESYLATE 10 MG PO TABS: 10.0000 mg | ORAL_TABLET | Freq: Every day | ORAL | 3 refills | Status: DC

## 2021-08-15 NOTE — Patient Instructions (Signed)
Medication Instructions:  ?Your physician has recommended you make the following change in your medication:  ? ?INCREASE: Amlodipine to 10mg  daily ? ?*If you need a refill on your cardiac medications before your next appointment, please call your pharmacy* ? ? ?Lab Work: ?TODAY: BMET, CBC ? ?If you have labs (blood work) drawn today and your tests are completely normal, you will receive your results only by: ?MyChart Message (if you have MyChart) OR ?A paper copy in the mail ?If you have any lab test that is abnormal or we need to change your treatment, we will call you to review the results. ? ? ?Follow-Up: ?At [facility], you and your health needs are our priority.  As part of our continuing mission to provide you with exceptional heart care, we have created designated Provider Care Teams.  These Care Teams include your primary Cardiologist (physician) and Advanced Practice Providers (APPs -  Physician Assistants and Nurse Practitioners) who all work together to provide you with the care you need, when you need it. ? ?We recommend signing up for the patient portal called "MyChart".  Sign up information is provided on this After Visit Summary.  MyChart is used to connect with patients for Virtual Visits (Telemedicine).  Patients are able to view lab/test results, encounter notes, upcoming appointments, etc.  Non-urgent messages can be sent to your provider as well.   ?To learn more about what you can do with MyChart, go to [email or web address].   ? ?Your next appointment:   ?1 year(s) ? ?The format for your next appointment:   ?In Person ? ?Provider:   ?You may see Cristopher Peru, MD or one of the following Advanced Practice Providers on your designated Care Team:   ?Tommye Standard, PA-C ?Legrand Como "Jonni Sanger" Schofield, PA-C  ?  ?

## 2021-10-20 ENCOUNTER — Other Ambulatory Visit: Payer: Self-pay | Admitting: Internal Medicine

## 2021-10-20 DIAGNOSIS — I480 Paroxysmal atrial fibrillation: Secondary | ICD-10-CM

## 2021-10-20 NOTE — Telephone Encounter (Signed)
Eliquis '5mg'$  refill request received. Patient is 86 years old, weight-63kg, Crea-0.83 on 08/15/2021, Diagnosis-Afib, and last seen by Oda Kilts on 08/15/2021. Dose is appropriate based on dosing criteria. Will send in refill to requested pharmacy.   ?

## 2021-10-31 ENCOUNTER — Ambulatory Visit (INDEPENDENT_AMBULATORY_CARE_PROVIDER_SITE_OTHER): Payer: Medicare Other

## 2021-10-31 DIAGNOSIS — I495 Sick sinus syndrome: Secondary | ICD-10-CM

## 2021-10-31 LAB — CUP PACEART REMOTE DEVICE CHECK
Battery Remaining Longevity: 77 mo
Battery Remaining Percentage: 72 %
Battery Voltage: 2.99 V
Brady Statistic AP VP Percent: 2.4 %
Brady Statistic AP VS Percent: 93 %
Brady Statistic AS VP Percent: 1 %
Brady Statistic AS VS Percent: 1.1 %
Brady Statistic RA Percent Paced: 86 %
Brady Statistic RV Percent Paced: 5.7 %
Date Time Interrogation Session: 20230519035804
Implantable Lead Implant Date: 20200921
Implantable Lead Implant Date: 20200921
Implantable Lead Location: 753859
Implantable Lead Location: 753860
Implantable Pulse Generator Implant Date: 20200921
Lead Channel Impedance Value: 390 Ohm
Lead Channel Impedance Value: 640 Ohm
Lead Channel Pacing Threshold Amplitude: 0.75 V
Lead Channel Pacing Threshold Amplitude: 0.875 V
Lead Channel Pacing Threshold Pulse Width: 0.5 ms
Lead Channel Pacing Threshold Pulse Width: 0.5 ms
Lead Channel Sensing Intrinsic Amplitude: 12 mV
Lead Channel Sensing Intrinsic Amplitude: 2 mV
Lead Channel Setting Pacing Amplitude: 1.125
Lead Channel Setting Pacing Amplitude: 1.75 V
Lead Channel Setting Pacing Pulse Width: 0.5 ms
Lead Channel Setting Sensing Sensitivity: 2 mV
Pulse Gen Model: 2272
Pulse Gen Serial Number: 9162202

## 2021-11-06 NOTE — Progress Notes (Signed)
Remote pacemaker transmission.   

## 2021-12-08 DIAGNOSIS — I480 Paroxysmal atrial fibrillation: Secondary | ICD-10-CM | POA: Diagnosis not present

## 2021-12-08 DIAGNOSIS — E7800 Pure hypercholesterolemia, unspecified: Secondary | ICD-10-CM | POA: Diagnosis not present

## 2021-12-08 DIAGNOSIS — I10 Essential (primary) hypertension: Secondary | ICD-10-CM | POA: Diagnosis not present

## 2021-12-15 ENCOUNTER — Emergency Department (HOSPITAL_COMMUNITY)
Admission: EM | Admit: 2021-12-15 | Discharge: 2021-12-15 | Disposition: A | Discharge status: Home / Self Care | Payer: Medicare Other | Attending: Emergency Medicine | Admitting: Emergency Medicine

## 2021-12-15 ENCOUNTER — Emergency Department (HOSPITAL_COMMUNITY): Payer: Medicare Other

## 2021-12-15 ENCOUNTER — Encounter (HOSPITAL_COMMUNITY): Payer: Self-pay

## 2021-12-15 ENCOUNTER — Other Ambulatory Visit: Payer: Self-pay

## 2021-12-15 DIAGNOSIS — R911 Solitary pulmonary nodule: Secondary | ICD-10-CM | POA: Diagnosis not present

## 2021-12-15 DIAGNOSIS — K869 Disease of pancreas, unspecified: Secondary | ICD-10-CM | POA: Diagnosis not present

## 2021-12-15 DIAGNOSIS — R531 Weakness: Secondary | ICD-10-CM | POA: Insufficient documentation

## 2021-12-15 DIAGNOSIS — E039 Hypothyroidism, unspecified: Secondary | ICD-10-CM | POA: Diagnosis not present

## 2021-12-15 DIAGNOSIS — R630 Anorexia: Secondary | ICD-10-CM | POA: Diagnosis not present

## 2021-12-15 DIAGNOSIS — Z79899 Other long term (current) drug therapy: Secondary | ICD-10-CM | POA: Insufficient documentation

## 2021-12-15 DIAGNOSIS — I10 Essential (primary) hypertension: Secondary | ICD-10-CM | POA: Diagnosis not present

## 2021-12-15 DIAGNOSIS — I4891 Unspecified atrial fibrillation: Secondary | ICD-10-CM | POA: Diagnosis not present

## 2021-12-15 DIAGNOSIS — K8689 Other specified diseases of pancreas: Secondary | ICD-10-CM | POA: Diagnosis not present

## 2021-12-15 DIAGNOSIS — R5383 Other fatigue: Secondary | ICD-10-CM | POA: Diagnosis not present

## 2021-12-15 DIAGNOSIS — Z7901 Long term (current) use of anticoagulants: Secondary | ICD-10-CM | POA: Insufficient documentation

## 2021-12-15 DIAGNOSIS — N281 Cyst of kidney, acquired: Secondary | ICD-10-CM | POA: Diagnosis not present

## 2021-12-15 DIAGNOSIS — K5900 Constipation, unspecified: Secondary | ICD-10-CM | POA: Diagnosis present

## 2021-12-15 LAB — LIPASE, BLOOD: Lipase: 18 U/L (ref 11–51)

## 2021-12-15 LAB — COMPREHENSIVE METABOLIC PANEL
ALT: 9 U/L (ref 0–44)
AST: 25 U/L (ref 15–41)
Albumin: 4.4 g/dL (ref 3.5–5.0)
Alkaline Phosphatase: 55 U/L (ref 38–126)
Anion gap: 9 (ref 5–15)
BUN: 14 mg/dL (ref 8–23)
CO2: 22 mmol/L (ref 22–32)
Calcium: 10.1 mg/dL (ref 8.9–10.3)
Chloride: 108 mmol/L (ref 98–111)
Creatinine, Ser: 0.72 mg/dL (ref 0.44–1.00)
GFR, Estimated: >60 mL/min (ref >60)
Glucose, Bld: 117 mg/dL — ABNORMAL HIGH (ref 70–99)
Potassium: 4 mmol/L (ref 3.5–5.1)
Sodium: 139 mmol/L (ref 135–145)
Total Bilirubin: 0.9 mg/dL (ref 0.3–1.2)
Total Protein: 7.5 g/dL (ref 6.5–8.1)

## 2021-12-15 LAB — CBC WITH DIFFERENTIAL/PLATELET
Abs Immature Granulocytes: 0.01 10*3/uL (ref 0.00–0.07)
Basophils Absolute: 0 10*3/uL (ref 0.0–0.1)
Basophils Relative: 1 %
Eosinophils Absolute: 0 10*3/uL (ref 0.0–0.5)
Eosinophils Relative: 1 %
HCT: 36.4 % (ref 36.0–46.0)
Hemoglobin: 11.2 g/dL — ABNORMAL LOW (ref 12.0–15.0)
Immature Granulocytes: 0 %
Lymphocytes Relative: 32 %
Lymphs Abs: 1.3 10*3/uL (ref 0.7–4.0)
MCH: 23.5 pg — ABNORMAL LOW (ref 26.0–34.0)
MCHC: 30.8 g/dL (ref 30.0–36.0)
MCV: 76.3 fL — ABNORMAL LOW (ref 80.0–100.0)
Monocytes Absolute: 0.4 10*3/uL (ref 0.1–1.0)
Monocytes Relative: 9 %
Neutro Abs: 2.3 10*3/uL (ref 1.7–7.7)
Neutrophils Relative %: 57 %
Platelets: 145 10*3/uL — ABNORMAL LOW (ref 150–400)
RBC: 4.77 MIL/uL (ref 3.87–5.11)
RDW: 16.8 % — ABNORMAL HIGH (ref 11.5–15.5)
WBC: 4 10*3/uL (ref 4.0–10.5)
nRBC: 0 % (ref 0.0–0.2)

## 2021-12-15 LAB — URINALYSIS, ROUTINE W REFLEX MICROSCOPIC
Bilirubin Urine: NEGATIVE
Glucose, UA: NEGATIVE mg/dL
Hgb urine dipstick: NEGATIVE
Ketones, ur: 5 mg/dL — AB
Nitrite: NEGATIVE
Protein, ur: NEGATIVE mg/dL
Specific Gravity, Urine: 1.004 — ABNORMAL LOW (ref 1.005–1.030)
pH: 7 (ref 5.0–8.0)

## 2021-12-15 MED ORDER — IOHEXOL 300 MG/ML  SOLN
100.0000 mL | Freq: Once | INTRAMUSCULAR | Status: AC | PRN
  Administered 2021-12-15: 100 mL via INTRAVENOUS

## 2021-12-15 MED ORDER — SODIUM CHLORIDE 0.9 % IV BOLUS
1000.0000 mL | Freq: Once | INTRAVENOUS | Status: AC
  Administered 2021-12-15: 1000 mL via INTRAVENOUS

## 2021-12-15 NOTE — Discharge Instructions (Addendum)
We discussed the pancreatic mass founding on your imaging. Call the GI physician on Thursday 12/18/21 to schedule a biopsy. Call the oncologist on Thursday 12/18/21 for follow up. Continue to take your current medications. Please return to the ED if you develop new or worsening symptoms including but not limited to abdominal pain, vomiting, and fever.

## 2021-12-15 NOTE — ED Provider Triage Note (Signed)
Emergency Medicine Provider Triage Evaluation Note  Shannon Ortiz , a 86 y.o. female  was evaluated in triage.  Pt complains of weakness, poor appetite, 10 pound weight loss, constipation over the last 2 weeks.  Patient reports significant burping.  Has had a poor p.o. intake and difficulty ambulating due to weakness.  Daughter at bedside states that she has been having to disimpact the patient.  Daughter feels like the patient is dehydrated and needs IV fluids.  She denies any fevers or chills, no nausea, vomiting, no abdominal pain, no dysuria or hematuria.  No bloody stools.  Review of Systems  Positive: As above Negative: As above  Physical Exam  BP (!) 152/91 (BP Location: Left Arm)   Pulse 87   Temp 98 F (36.7 C) (Oral)   Resp 15   Ht '5\' 8"'$  (1.727 m)   Wt 56.7 kg   SpO2 100%   BMI 19.01 kg/m  Gen:   Awake, no distress   Resp:  Normal effort  MSK:   Moves extremities without difficulty  Other:  Abdomen soft, nontender  Medical Decision Making  Medically screening exam initiated at 1:47 PM.  Appropriate orders placed.  [name] was informed that the remainder of the evaluation will be completed by another provider, this initial triage assessment does not replace that evaluation, and the importance of remaining in the ED until their evaluation is complete.     Garald Balding, PA-C 12/15/21 1348

## 2021-12-15 NOTE — ED Provider Notes (Cosign Needed Addendum)
[facility] DEPT Provider Note   CSN: [id number] Arrival date & time: 12/15/21  1252     History  Chief Complaint  Patient presents with   Constipation   not eating   Weakness    Shannon Ortiz is a 86 y.o. female with history of HTN, Afib, hypothyroidism, esophageal stenosis, s/p esophageal diverticulectomy and hiatal hernia repair 10/25/18 that presents with 3 months of decreased appetite, constipation, and generalized weakness. The patient states that her last bowel movement was 3 days ago and that her stool was hard.The patient states that she has taken Mirilax without relief but has had some relief of her constipation with use of an OTC stool softener. The patient is also complaining of several weeks of fatigue, decreased activity, weight loss, lightheadedness, difficulty swallowing, and increased flatulence.   The patient states that she is currently taking eliquis, amlodipine, metroprolol, and pantoprazole. She denies history of DM and cancer. The patient denies hematochezia, melena, fever, nausea, vomiting, diarrhea, abdominal pain, dysuria, urgency, chest pain, SOB, swelling.   The history is provided by the patient (and daughter).  Constipation Associated symptoms: no abdominal pain, no diarrhea, no dysuria and no fever   Weakness Associated symptoms: no abdominal pain, no chest pain, no diarrhea, no dysuria, no fever, no shortness of breath and no urgency        Home Medications Prior to Admission medications   Medication Sig Start Date End Date Taking? Authorizing Provider  amLODipine (NORVASC) 10 MG tablet Take 1 tablet (10 mg total) by mouth daily. 08/15/21   Shirley Friar, PA-C  apixaban (ELIQUIS) 5 MG TABS tablet Take 1 tablet by mouth twice daily 10/20/21   Shirley Friar, PA-C  feeding supplement (ENSURE SURGERY) LIQD Take 237 mLs by mouth 2 (two) times daily between meals. 10/17/18   Mercy Riding, MD  fexofenadine  (ALLEGRA) 180 MG tablet Take 180 mg by mouth daily as needed for allergies or rhinitis.    [provider]  losartan (COZAAR) 100 MG tablet Take 100 mg by mouth daily. 01/12/19   [provider]  metoprolol succinate (TOPROL-XL) 50 MG 24 hr tablet Take 50 mg by mouth daily. 07/07/21   [provider]  pantoprazole (PROTONIX) 20 MG tablet Take 20 mg by mouth daily. 03/16/19   [provider]      Allergies    Ace inhibitors, Elavil [amitriptyline hcl], and Ketek [telithromycin]    Review of Systems   Review of Systems  Constitutional:  Positive for activity change, appetite change, fatigue and unexpected weight change. Negative for chills and fever.  HENT:  Positive for trouble swallowing.   Respiratory:  Negative for shortness of breath.   Cardiovascular:  Negative for chest pain and leg swelling.  Gastrointestinal:  Positive for constipation. Negative for abdominal pain, blood in stool and diarrhea.       Positive for flatulence.   Genitourinary:  Negative for dysuria and urgency.  Neurological:  Positive for weakness and light-headedness.    Physical Exam Updated Vital Signs BP (!) 140/105   Pulse (!) 58   Temp 98 F (36.7 C) (Oral)   Resp 18   Ht '5\' 8"'$  (1.727 m)   Wt 56.7 kg   SpO2 100%   BMI 19.01 kg/m  Physical Exam Constitutional:      General: She is not in acute distress. HENT:     Mouth/Throat:     Mouth: Mucous membranes are dry.  Cardiovascular:  Rate and Rhythm: Normal rate and regular rhythm.     Pulses: Normal pulses.     Heart sounds: No murmur heard.    No friction rub. No gallop.  Pulmonary:     Effort: Pulmonary effort is normal.     Breath sounds: No wheezing, rhonchi or rales.  Abdominal:     General: There is no distension.     Palpations: There is no mass.     Tenderness: There is no abdominal tenderness. There is no guarding or rebound.  Musculoskeletal:        General: No swelling.  Skin:    General:  Skin is dry.  Neurological:     Mental Status: She is alert.     ED Results / Procedures / Treatments   Labs (all labs ordered are listed, but only abnormal results are displayed) Labs Reviewed  CBC WITH DIFFERENTIAL/PLATELET - Abnormal; Notable for the following components:      Result Value   Hemoglobin 11.2 (*)    MCV 76.3 (*)    MCH 23.5 (*)    RDW 16.8 (*)    Platelets 145 (*)    All other components within normal limits  COMPREHENSIVE METABOLIC PANEL - Abnormal; Notable for the following components:   Glucose, Bld 117 (*)    All other components within normal limits  LIPASE, BLOOD  URINALYSIS, ROUTINE W REFLEX MICROSCOPIC    EKG None  Radiology No results found.  Procedures Procedures    Medications Ordered in ED Medications  sodium chloride 0.9 % bolus 1,000 mL (has no administration in time range)    ED Course/ Medical Decision Making/ A&P                           Medical Decision Making Shannon Ortiz is a 86 y.o. female with history of HTN, Afib, hypothyroidism, esophageal stenosis, s/p esophageal diverticulectomy and hiatal hernia repair that presents with 3 months of decreased appetite, constipation, and generalized weakness. Differential diagnosis includes but is not limited to small bowel obstruction, Ileus, electrolyte disturbance, anemia. Will order CT abdomen and pelvis with contrast, CBC, CMP, Lipase, and UA to investigate her constipation and generalized weakness. Will order IVF for potential dehydration. Patient discussed with Dr. Darl Householder.    Problems Addressed: Pancreatic mass: undiagnosed new problem with uncertain prognosis  Amount and/or Complexity of Data Reviewed Labs: ordered. Decision-making details documented in ED Course. Radiology: ordered and independent interpretation performed. Decision-making details documented in ED Course.  Risk Prescription drug management.  7:51 PM Discussed with patient and daughter the findings of her CT  concerning for a pancreatic mass. Discussed plan for follow up with GI and oncology.         Final Clinical Impression(s) / ED Diagnoses Final diagnoses:  None    Rx / DC Orders ED Discharge Orders     None         Bernadine Melecio, Claudia Desanctis, MD 12/15/21 Earney Navy    Starlyn Skeans, MD 12/15/21 2312    Drenda Freeze, MD 12/15/21 2263    Starlyn Skeans, MD 12/15/21 2346    Drenda Freeze, MD 12/16/21 [phone]

## 2021-12-15 NOTE — ED Triage Notes (Signed)
Patient and patient's daughter report that the patient has not been eating x 2 weeks. Patient 's daughter reports that the patient has lost 10 lbs during this time. Patient also c/o weakness.  Patient's daughter also reports that the patient has been constipated x 1 1/2 months and she is having to disimpact the patient.

## 2021-12-17 DIAGNOSIS — R110 Nausea: Secondary | ICD-10-CM | POA: Diagnosis not present

## 2021-12-17 DIAGNOSIS — R634 Abnormal weight loss: Secondary | ICD-10-CM | POA: Diagnosis not present

## 2021-12-17 DIAGNOSIS — K869 Disease of pancreas, unspecified: Secondary | ICD-10-CM | POA: Diagnosis not present

## 2021-12-18 ENCOUNTER — Telehealth: Payer: Self-pay | Admitting: Physician Assistant

## 2021-12-18 ENCOUNTER — Other Ambulatory Visit: Payer: Self-pay | Admitting: Oncology

## 2021-12-18 DIAGNOSIS — K8689 Other specified diseases of pancreas: Secondary | ICD-10-CM

## 2021-12-18 NOTE — Telephone Encounter (Signed)
Scheduled appt per 7/5 referral. Pt is aware of appt date and time. Pt is aware to arrive 15 mins prior to appt time and to bring and updated insurance card. Pt is aware of appt location.   

## 2021-12-23 ENCOUNTER — Other Ambulatory Visit: Payer: Self-pay | Admitting: Gastroenterology

## 2021-12-23 ENCOUNTER — Encounter (HOSPITAL_COMMUNITY): Payer: Self-pay | Admitting: Gastroenterology

## 2021-12-23 DIAGNOSIS — R9389 Abnormal findings on diagnostic imaging of other specified body structures: Secondary | ICD-10-CM | POA: Diagnosis not present

## 2021-12-23 DIAGNOSIS — K8689 Other specified diseases of pancreas: Secondary | ICD-10-CM | POA: Diagnosis not present

## 2021-12-26 ENCOUNTER — Encounter: Payer: Self-pay | Admitting: Physician Assistant

## 2021-12-26 ENCOUNTER — Inpatient Hospital Stay: Payer: Medicare Other | Attending: Physician Assistant | Admitting: Physician Assistant

## 2021-12-26 ENCOUNTER — Inpatient Hospital Stay: Payer: Medicare Other

## 2021-12-26 ENCOUNTER — Telehealth: Payer: Self-pay

## 2021-12-26 ENCOUNTER — Ambulatory Visit (INDEPENDENT_AMBULATORY_CARE_PROVIDER_SITE_OTHER): Payer: Medicare Other | Admitting: Physician Assistant

## 2021-12-26 ENCOUNTER — Other Ambulatory Visit: Payer: Self-pay

## 2021-12-26 ENCOUNTER — Telehealth: Payer: Self-pay | Admitting: *Deleted

## 2021-12-26 VITALS — BP 145/76 | HR 82 | Temp 98.1°F | Resp 18 | Wt 129.9 lb

## 2021-12-26 DIAGNOSIS — Z809 Family history of malignant neoplasm, unspecified: Secondary | ICD-10-CM | POA: Diagnosis not present

## 2021-12-26 DIAGNOSIS — Z8249 Family history of ischemic heart disease and other diseases of the circulatory system: Secondary | ICD-10-CM | POA: Diagnosis not present

## 2021-12-26 DIAGNOSIS — C250 Malignant neoplasm of head of pancreas: Secondary | ICD-10-CM | POA: Insufficient documentation

## 2021-12-26 DIAGNOSIS — D509 Iron deficiency anemia, unspecified: Secondary | ICD-10-CM | POA: Diagnosis not present

## 2021-12-26 DIAGNOSIS — I10 Essential (primary) hypertension: Secondary | ICD-10-CM

## 2021-12-26 DIAGNOSIS — E785 Hyperlipidemia, unspecified: Secondary | ICD-10-CM

## 2021-12-26 DIAGNOSIS — Z01810 Encounter for preprocedural cardiovascular examination: Secondary | ICD-10-CM | POA: Diagnosis not present

## 2021-12-26 DIAGNOSIS — K8689 Other specified diseases of pancreas: Secondary | ICD-10-CM

## 2021-12-26 DIAGNOSIS — K5900 Constipation, unspecified: Secondary | ICD-10-CM

## 2021-12-26 DIAGNOSIS — Z79899 Other long term (current) drug therapy: Secondary | ICD-10-CM

## 2021-12-26 DIAGNOSIS — I700 Atherosclerosis of aorta: Secondary | ICD-10-CM | POA: Diagnosis not present

## 2021-12-26 DIAGNOSIS — M797 Fibromyalgia: Secondary | ICD-10-CM

## 2021-12-26 DIAGNOSIS — Z7901 Long term (current) use of anticoagulants: Secondary | ICD-10-CM

## 2021-12-26 DIAGNOSIS — R5383 Other fatigue: Secondary | ICD-10-CM

## 2021-12-26 DIAGNOSIS — Z833 Family history of diabetes mellitus: Secondary | ICD-10-CM

## 2021-12-26 DIAGNOSIS — M8580 Other specified disorders of bone density and structure, unspecified site: Secondary | ICD-10-CM | POA: Diagnosis not present

## 2021-12-26 DIAGNOSIS — Z8719 Personal history of other diseases of the digestive system: Secondary | ICD-10-CM

## 2021-12-26 DIAGNOSIS — Z9049 Acquired absence of other specified parts of digestive tract: Secondary | ICD-10-CM | POA: Diagnosis not present

## 2021-12-26 DIAGNOSIS — I517 Cardiomegaly: Secondary | ICD-10-CM

## 2021-12-26 LAB — CMP (CANCER CENTER ONLY)
ALT: 5 U/L (ref 0–44)
AST: 16 U/L (ref 15–41)
Albumin: 4.4 g/dL (ref 3.5–5.0)
Alkaline Phosphatase: 61 U/L (ref 38–126)
Anion gap: 6 (ref 5–15)
BUN: 14 mg/dL (ref 8–23)
CO2: 28 mmol/L (ref 22–32)
Calcium: 10.1 mg/dL (ref 8.9–10.3)
Chloride: 104 mmol/L (ref 98–111)
Creatinine: 0.79 mg/dL (ref 0.44–1.00)
GFR, Estimated: >60 mL/min (ref >60)
Glucose, Bld: 113 mg/dL — ABNORMAL HIGH (ref 70–99)
Potassium: 3.4 mmol/L — ABNORMAL LOW (ref 3.5–5.1)
Sodium: 138 mmol/L (ref 135–145)
Total Bilirubin: 0.5 mg/dL (ref 0.3–1.2)
Total Protein: 7.5 g/dL (ref 6.5–8.1)

## 2021-12-26 LAB — CBC WITH DIFFERENTIAL (CANCER CENTER ONLY)
Abs Immature Granulocytes: 0.01 10*3/uL (ref 0.00–0.07)
Basophils Absolute: 0 10*3/uL (ref 0.0–0.1)
Basophils Relative: 0 %
Eosinophils Absolute: 0 10*3/uL (ref 0.0–0.5)
Eosinophils Relative: 0 %
HCT: 31.5 % — ABNORMAL LOW (ref 36.0–46.0)
Hemoglobin: 10.3 g/dL — ABNORMAL LOW (ref 12.0–15.0)
Immature Granulocytes: 0 %
Lymphocytes Relative: 28 %
Lymphs Abs: 1.4 10*3/uL (ref 0.7–4.0)
MCH: 23.4 pg — ABNORMAL LOW (ref 26.0–34.0)
MCHC: 32.7 g/dL (ref 30.0–36.0)
MCV: 71.4 fL — ABNORMAL LOW (ref 80.0–100.0)
Monocytes Absolute: 0.5 10*3/uL (ref 0.1–1.0)
Monocytes Relative: 9 %
Neutro Abs: 3.1 10*3/uL (ref 1.7–7.7)
Neutrophils Relative %: 63 %
Platelet Count: 200 10*3/uL (ref 150–400)
RBC: 4.41 MIL/uL (ref 3.87–5.11)
RDW: 16.8 % — ABNORMAL HIGH (ref 11.5–15.5)
WBC Count: 5 10*3/uL (ref 4.0–10.5)
nRBC: 0 % (ref 0.0–0.2)

## 2021-12-26 LAB — PROTIME-INR
INR: 1.3 — ABNORMAL HIGH (ref 0.8–1.2)
Prothrombin Time: 15.9 seconds — ABNORMAL HIGH (ref 11.4–15.2)

## 2021-12-26 LAB — IRON AND IRON BINDING CAPACITY (CC-WL,HP ONLY)
Iron: 36 ug/dL (ref 28–170)
Saturation Ratios: 15 % (ref 10.4–31.8)
TIBC: 239 ug/dL — ABNORMAL LOW (ref 250–450)
UIBC: 203 ug/dL (ref 148–442)

## 2021-12-26 LAB — APTT: aPTT: 43 seconds — ABNORMAL HIGH (ref 24–36)

## 2021-12-26 LAB — CEA (IN HOUSE-CHCC): CEA (CHCC-In House): 3.1 ng/mL (ref 0.00–5.00)

## 2021-12-26 LAB — FERRITIN: Ferritin: 338 ng/mL — ABNORMAL HIGH (ref 11–307)

## 2021-12-26 NOTE — Telephone Encounter (Signed)
Pt added on to today schedule for pre op per Nicholes Rough, PAC. Procedure is 12/30/21 and pt is on Eliquis.   Med rec and Consent was done.     Patient Consent for Virtual Visit        Shannon Ortiz has provided verbal consent on 12/26/2021 for a virtual visit (video or telephone).   CONSENT FOR VIRTUAL VISIT FOR:  Shannon Ortiz  By participating in this virtual visit I agree to the following:  I hereby voluntarily request, consent and authorize [facility] and its employed or contracted physicians, physician assistants, nurse practitioners or other licensed health care professionals (the Practitioner), to provide me with telemedicine health care services (the "Services") as deemed necessary by the treating Practitioner. I acknowledge and consent to receive the Services by the Practitioner via telemedicine. I understand that the telemedicine visit will involve communicating with the Practitioner through live audiovisual communication technology and the disclosure of certain medical information by electronic transmission. I acknowledge that I have been given the opportunity to request an in-person assessment or other available alternative prior to the telemedicine visit and am voluntarily participating in the telemedicine visit.  I understand that I have the right to withhold or withdraw my consent to the use of telemedicine in the course of my care at any time, without affecting my right to future care or treatment, and that the Practitioner or I may terminate the telemedicine visit at any time. I understand that I have the right to inspect all information obtained and/or recorded in the course of the telemedicine visit and may receive copies of available information for a reasonable fee.  I understand that some of the potential risks of receiving the Services via telemedicine include:  Delay or interruption in medical evaluation due to technological equipment failure or disruption; Information  transmitted may not be sufficient (e.g. poor resolution of images) to allow for appropriate medical decision making by the Practitioner; and/or  In rare instances, security protocols could fail, causing a breach of personal health information.  Furthermore, I acknowledge that it is my responsibility to provide information about my medical history, conditions and care that is complete and accurate to the best of my ability. I acknowledge that Practitioner's advice, recommendations, and/or decision may be based on factors not within their control, such as incomplete or inaccurate data provided by me or distortions of diagnostic images or specimens that may result from electronic transmissions. I understand that the practice of medicine is not an exact science and that Practitioner makes no warranties or guarantees regarding treatment outcomes. I acknowledge that a copy of this consent can be made available to me via my patient portal ([facility]), or I can request a printed copy by calling the office of [facility].    I understand that my insurance will be billed for this visit.   I have read or had this consent read to me. I understand the contents of this consent, which adequately explains the benefits and risks of the Services being provided via telemedicine.  I have been provided ample opportunity to ask questions regarding this consent and the Services and have had my questions answered to my satisfaction. I give my informed consent for the services to be provided through the use of telemedicine in my medical care

## 2021-12-26 NOTE — Telephone Encounter (Signed)
   Pre-operative Risk Assessment    Patient Name: Shannon Ortiz  DOB: 19-Apr-1936 MRN: [id number]      Request for Surgical Clearance    Procedure:   Upper Esophageal Endoscopy Ultrasound  Date of Surgery:  Clearance 12/30/21  STAT                               Surgeon:  Dr Francina Ames Group or Practice Name:  [facility] Phone number:  [phone] Fax number:  [phone]   Type of Clearance Requested:   - Medical  - Pharmacy:  Hold Apixaban (Eliquis)     Type of Anesthesia:   Propofol   Additional requests/questions:   None  Signed, Aviona Martenson   12/26/2021, 8:12 AM

## 2021-12-26 NOTE — Progress Notes (Signed)
Virtual Visit via Telephone Note   Because of Shannon Ortiz co-morbid illnesses, she is at least at moderate risk for complications without adequate follow up.  This format is felt to be most appropriate for this patient at this time.  The patient did not have access to video technology/had technical difficulties with video requiring transitioning to audio format only (telephone).  All issues noted in this document were discussed and addressed.  No physical exam could be performed with this format.  Please refer to the patient's chart for her consent to telehealth for [facility].  Evaluation Performed:  Preoperative cardiovascular risk assessment _____________   Date:  12/26/2021   Patient ID:  Shannon Ortiz, DOB Nov 15, 1935, MRN [id number] Patient Location:  Home Provider location:   Office  Primary Care Provider:  Lawerance Cruel, MD Primary Cardiologist:  Jenkins Rouge, MD  Chief Complaint / Patient Profile   86 y.o. y/o female with a h/o anxiety, SND status post [facility] PPM, PAF, bradycardia, hypertension, hypothyroidism, near syncope who is pending upper endoscopy and presents today for telephonic preoperative cardiovascular risk assessment.  Past Medical History    Past Medical History:  Diagnosis Date   Acquired diverticulum of distal esophagus with dysphagia 10/03/2018   AKI (acute kidney injury) ([place]) 08/31/2018   Anxiety    Bradycardia 02/16/2014   Chest tightness    DDD (degenerative disc disease), lumbar    DJD (degenerative joint disease)    Dry eyes    Esophageal stenosis 08/31/2018   Facial pain    Fibromyalgia    Hemorrhoid    Hypertension    Hypokalemia 08/31/2018   Hypothyroidism    Lightheadedness    Near syncope 02/16/2014   Osteopenia    Sigmoid diverticulosis    Thyroid nodule    PT DENIES   Past Surgical History:  Procedure Laterality Date   ABDOMINAL HYSTERECTOMY     COMPLETE   BALLOON DILATION N/A 09/01/2018   Procedure: BALLOON  DILATION;  Surgeon: Ronnette Juniper, MD;  Location: [place];  Service: Gastroenterology;  Laterality: N/A;   BIOPSY  09/01/2018   Procedure: BIOPSY;  Surgeon: Ronnette Juniper, MD;  Location: [facility] ENDOSCOPY;  Service: Gastroenterology;;   BOTOX INJECTION  09/01/2018   Procedure: BOTOX INJECTION;  Surgeon: Ronnette Juniper, MD;  Location: [facility];  Service: Gastroenterology;;   BREAST SURGERY     breast biopsy   CHOLECYSTECTOMY     ESOPHAGEAL MANOMETRY N/A 10/07/2018   Procedure: ESOPHAGEAL MANOMETRY (EM);  Surgeon: Ronnette Juniper, MD;  Location: [facility] ENDOSCOPY;  Service: Gastroenterology;  Laterality: N/A;   ESOPHAGOGASTRODUODENOSCOPY N/A 10/25/2018   Procedure: ESOPHAGOGASTRODUODENOSCOPY (EGD);  Surgeon: Michael Boston, MD;  Location: [facility];  Service: General;  Laterality: N/A;   ESOPHAGOGASTRODUODENOSCOPY (EGD) WITH PROPOFOL N/A 09/01/2018   Procedure: ESOPHAGOGASTRODUODENOSCOPY (EGD) WITH PROPOFOL;  Surgeon: Ronnette Juniper, MD;  Location: [place];  Service: Gastroenterology;  Laterality: N/A;   PACEMAKER IMPLANT N/A 03/06/2019   Procedure: PACEMAKER IMPLANT;  Surgeon: Evans Lance, MD;  Location: [place] [facility];  Service: Cardiovascular;  Laterality: N/A;    Allergies  Allergies  Allergen Reactions   Ace Inhibitors Cough   Elavil [Amitriptyline Hcl] Hives   Ketek [Telithromycin] Hives    History of Present Illness    Shannon Ortiz is a 86 y.o. female who presents via audio/video conferencing for a telehealth visit today.  Pt was last seen in cardiology clinic on 08/15/2021 by Lytle Michaels, [place].  At that time Shannon  Anne Ortiz was doing well .  The patient is now pending procedure as outlined above.   Since her last visit, she states she has been doing well since her last appointment in March.  She has had no chest pain or shortness of breath.  She has not had any issues with her pacemaker or felt any fluttering in her chest.  She states she is able to walk 1-2 blocks without an issue.  She can also  go up and down a few stairs.  She does not run a vacuum cleaner but will wipe down counters and clean dishes in her house.  She has help with all of her yard work.  She does not participate in any recreational activities.  Per office protocol, patient can hold Eliquis for 1-2 days prior to procedure.  Patient was told to go ahead and hold her Eliquis on Sunday since her procedure is on Tuesday.  Reports no shortness of breath nor dyspnea on exertion. Reports no chest pain, pressure, or tightness. No edema, orthopnea, PND. Reports no palpitations.     Home Medications    Prior to Admission medications   Medication Sig Start Date End Date Taking? Authorizing Provider  amLODipine (NORVASC) 10 MG tablet Take 1 tablet (10 mg total) by mouth daily. 08/15/21   Shirley Friar, PA-C  apixaban (ELIQUIS) 5 MG TABS tablet Take 1 tablet by mouth twice daily 10/20/21   Shirley Friar, PA-C  docusate sodium (COLACE) 100 MG capsule Take 100 mg by mouth 2 (two) times daily.    [provider]  feeding supplement (ENSURE SURGERY) LIQD Take 237 mLs by mouth 2 (two) times daily between meals. Patient taking differently: Take 237 mLs by mouth 3 (three) times a week. 10/17/18   Mercy Riding, MD  losartan (COZAAR) 100 MG tablet Take 100 mg by mouth in the morning. 01/12/19   [provider]  metoprolol succinate (TOPROL-XL) 50 MG 24 hr tablet Take 50 mg by mouth in the morning. 07/07/21   [provider]  ondansetron (ZOFRAN-ODT) 4 MG disintegrating tablet Take 4 mg by mouth every 8 (eight) hours as needed. 12/17/21   [provider]  pantoprazole (PROTONIX) 20 MG tablet Take 20 mg by mouth daily as needed (acid reflux.). 03/16/19   [provider]    Physical Exam    Vital Signs:  Shannon Ortiz does not have vital signs available for review today.  Given telephonic nature of communication, physical exam is limited. AAOx3. NAD. Normal affect.  Speech and  respirations are unlabored.  Accessory Clinical Findings    None  Assessment & Plan    1.  Preoperative Cardiovascular Risk Assessment:  Ms. Gores perioperative risk of a major cardiac event is 0.4% according to the Revised Cardiac Risk Index (RCRI).  Therefore, she is at low risk for perioperative complications.   Her functional capacity is fair at 4.64 METs according to the Duke Activity Status Index (DASI). Recommendations: According to ACC/AHA guidelines, no further cardiovascular testing needed.  The patient may proceed to surgery at acceptable risk.   Antiplatelet and/or Anticoagulation Recommendations:  Eliquis (Apixaban) can be held for 1-2 days prior to surgery.  Please resume post op when felt to be safe.    A copy of this note will be routed to requesting surgeon.  Time:   Today, I have spent 10 minutes with the patient with telehealth technology discussing medical history, symptoms, and management plan.  Elgie Collard, PA-C  12/26/2021, 5:16 PM

## 2021-12-26 NOTE — Telephone Encounter (Signed)
   Name: Shannon Ortiz  DOB: 1936-06-09  MRN: [id number]  Primary Cardiologist: Jenkins Rouge, MD  Chart reviewed as part of pre-operative protocol coverage. Because of [name] past medical history and time since last visit, she will require a follow-up tele visit in order to better assess preoperative cardiovascular risk.  Pre-op covering staff: - Please schedule appointment and call patient to inform them. If patient already had an upcoming appointment within acceptable timeframe, please add "pre-op clearance" to the appointment notes so provider is aware. - Please contact requesting surgeon's office via preferred method (i.e, phone, fax) to inform them of need for appointment prior to surgery.  This message will also be routed to pharmacy pool for input on holding Eliquis as requested below so that this information is available to the clearing provider at time of patient's appointment.   Elgie Collard, PA-C  12/26/2021, 11:18 AM

## 2021-12-26 NOTE — Telephone Encounter (Signed)
Pt added on to today schedule for pre op per Nicholes Rough, PAC. Procedure is 12/30/21 and pt is on Eliquis.   Med rec and Consent was done.

## 2021-12-26 NOTE — Progress Notes (Unsigned)
[place] [phone]   [phone]  INITIAL CONSULTATION:  Patient Care Team: Lawerance Cruel, MD as PCP - General (Family Medicine) Josue Hector, MD as PCP - Cardiology (Cardiology) Evans Lance, MD as PCP - Electrophysiology (Cardiology) Michael Boston, MD as Consulting Physician (General Surgery) Ronnette Juniper, MD as Consulting Physician (Gastroenterology) Cordelia Poche as Physician Assistant (Hematology and Oncology) Truitt Merle, MD as Consulting Physician (Oncology) Royston Bake, RN as Oncology Nurse Navigator (Oncology)  CHIEF COMPLAINTS/PURPOSE OF CONSULTATION:  Pancreatic mass  HISTORY OF PRESENTING ILLNESS:  Shannon Ortiz 86 y.o. female with history of HTN, hyperlipidemia, diverticulosis, fibromyalgia, DDD, DJD and osteopenia. She presents today for initial evaluation for new diagnosed pancreatic mass concerning for malignancy. She is accompanied by her daughter and grandson for this visit.   On review of the previous records, Shannon Ortiz presented to the ER on 12/15/2021 due to 3 month history of decreased appetite, constipation and weakness. CT abdomen and pelvis was obtained that showed marked main pancreatic ductal dilatation and mild bilary ductal dilation with ill-defined soft tissue at the pancreatic head.   On exam today, Shannon Ortiz reports ongoing fatigue that has been present for the past two months. She can complete her daily routines but requires frequent resting. She currently lives with her daughter. She reports decreased appetite and has lost 14 lbs since March 2023, which was unintentional. She denies nausea, vomiting or abdominal pain. She reports constipation and has a bowel movement every 4-5 days. She currently taking colace daily with minimal improvement. She denies easy bruising or signs of active bleeding. She denies fevers, chills, night sweats, shortness of breath, chest pain or cough.  She has no other complaints. Rest of the 10 point ROS is below.   MEDICAL HISTORY:  Past Medical History:  Diagnosis Date   Acquired diverticulum of distal esophagus with dysphagia 10/03/2018   AKI (acute kidney injury) ([place]) 08/31/2018   Anxiety    Bradycardia 02/16/2014   Chest tightness    DDD (degenerative disc disease), lumbar    DJD (degenerative joint disease)    Dry eyes    Esophageal stenosis 08/31/2018   Facial pain    Fibromyalgia    Hemorrhoid    Hypertension    Hypokalemia 08/31/2018   Hypothyroidism    Lightheadedness    Near syncope 02/16/2014   Osteopenia    Sigmoid diverticulosis    Thyroid nodule    PT DENIES    SURGICAL HISTORY: Past Surgical History:  Procedure Laterality Date   ABDOMINAL HYSTERECTOMY     COMPLETE   BALLOON DILATION N/A 09/01/2018   Procedure: BALLOON DILATION;  Surgeon: Ronnette Juniper, MD;  Location: [place];  Service: Gastroenterology;  Laterality: N/A;   BIOPSY  09/01/2018   Procedure: BIOPSY;  Surgeon: Ronnette Juniper, MD;  Location: [facility] ENDOSCOPY;  Service: Gastroenterology;;   BOTOX INJECTION  09/01/2018   Procedure: BOTOX INJECTION;  Surgeon: Ronnette Juniper, MD;  Location[facility] ENDOSCOPY;  Service: Gastroenterology;;   BREAST SURGERY     breast biopsy   CHOLECYSTECTOMY     ESOPHAGEAL MANOMETRY N/A 10/07/2018   Procedure: ESOPHAGEAL MANOMETRY (EM);  Surgeon: Ronnette Juniper, MD;  Location: [facility] ENDOSCOPY;  Service: Gastroenterology;  Laterality: N/A;   ESOPHAGOGASTRODUODENOSCOPY N/A 10/25/2018   Procedure: ESOPHAGOGASTRODUODENOSCOPY (EGD);  Surgeon: Michael Boston, MD;  Location: [facility] ORS;  Service: General;  Laterality: N/A;   ESOPHAGOGASTRODUODENOSCOPY (EGD) WITH PROPOFOL N/A 09/01/2018   Procedure: ESOPHAGOGASTRODUODENOSCOPY (EGD)  WITH PROPOFOL;  Surgeon: Ronnette Juniper, MD;  Location: [place];  Service: Gastroenterology;  Laterality: N/A;   PACEMAKER IMPLANT N/A 03/06/2019   Procedure: PACEMAKER IMPLANT;  Surgeon: Evans Lance, MD;  Location: [place] [facility];  Service: Cardiovascular;  Laterality: N/A;    SOCIAL HISTORY: Social History   Socioeconomic History   Marital status: Widowed    Spouse name: Not on file   Number of children: Not on file   Years of education: Not on file   Highest education level: Not on file  Occupational History   Not on file  Tobacco Use   Smoking status: Never   Smokeless tobacco: Never  Vaping Use   Vaping Use: Never used  Substance and Sexual Activity   Alcohol use: No   Drug use: No   Sexual activity: Never  Other Topics Concern   Not on file  Social History Narrative   Not on file   Social Determinants of Health   Financial Resource Strain: Not on file  Food Insecurity: Not on file  Transportation Needs: Not on file  Physical Activity: Not on file  Stress: Not on file  Social Connections: Not on file  Intimate Partner Violence: Not on file    FAMILY HISTORY: Family History  Problem Relation Age of Onset   Hypertension Mother    Diabetes Mother        non-insulin dependant   CAD Brother    Cancer Maternal Aunt    Cancer Maternal Uncle     ALLERGIES:  is allergic to ace inhibitors, elavil [amitriptyline hcl], and ketek [telithromycin].  MEDICATIONS:  Current Outpatient Medications  Medication Sig Dispense Refill   amLODipine (NORVASC) 10 MG tablet Take 1 tablet (10 mg total) by mouth daily. 90 tablet 3   apixaban (ELIQUIS) 5 MG TABS tablet Take 1 tablet by mouth twice daily 60 tablet 10   docusate sodium (COLACE) 100 MG capsule Take 100 mg by mouth 2 (two) times daily.     feeding supplement (ENSURE SURGERY) LIQD Take 237 mLs by mouth 2 (two) times daily between meals. (Patient taking differently: Take 237 mLs by mouth 3 (three) times a week.) 60 Bottle 0   losartan (COZAAR) 100 MG tablet Take 100 mg by mouth in the morning.     metoprolol succinate (TOPROL-XL) 50 MG 24 hr tablet Take 50 mg by mouth in the morning.     ondansetron (ZOFRAN-ODT) 4 MG disintegrating  tablet Take 4 mg by mouth every 8 (eight) hours as needed.     pantoprazole (PROTONIX) 20 MG tablet Take 20 mg by mouth daily as needed (acid reflux.).     polyethylene glycol (MIRALAX) 17 g packet Take 17 g by mouth daily. 30 each 3   No current facility-administered medications for this visit.    REVIEW OF SYSTEMS:   Constitutional: ( - ) fevers, ( - )  chills , ( - ) night sweats Eyes: ( - ) blurriness of vision, ( - ) double vision, ( - ) watery eyes Ears, nose, mouth, throat, and face: ( - ) mucositis, ( - ) sore throat Respiratory: ( - ) cough, ( - ) dyspnea, ( - ) wheezes Cardiovascular: ( - ) palpitation, ( - ) chest discomfort, ( - ) lower extremity swelling Gastrointestinal:  ( - ) nausea, ( - ) heartburn, ( + ) change in bowel habits Skin: ( - ) abnormal skin rashes Lymphatics: ( - ) new lymphadenopathy, ( - )  easy bruising Neurological: ( - ) numbness, ( - ) tingling, ( - ) new weaknesses Behavioral/Psych: ( - ) mood change, ( - ) new changes  All other systems were reviewed with the patient and are negative.  PHYSICAL EXAMINATION: ECOG PERFORMANCE STATUS: 2 - Symptomatic, <50% confined to bed  Vitals:   12/26/21 1150  BP: (!) 145/76  Pulse: 82  Resp: 18  Temp: 98.1 F (36.7 C)  SpO2: 100%   Filed Weights   12/26/21 1150  Weight: 129 lb 14.4 oz (58.9 kg)    GENERAL: well appearing female in NAD  SKIN: skin color, texture, turgor are normal, no rashes or significant lesions EYES: conjunctiva are pink and non-injected, sclera clear OROPHARYNX: no exudate, no erythema; lips, buccal mucosa, and tongue normal  NECK: supple, non-tender LYMPH:  no palpable lymphadenopathy in the cervical or supraclavicular lymph nodes.  LUNGS: clear to auscultation and percussion with normal breathing effort HEART: regular rate & rhythm and no murmurs and no lower extremity edema ABDOMEN: soft, non-distended, normal bowel sounds. Point tenderness to palpation in mid abdominal region.  No guarding Musculoskeletal: no cyanosis of digits and no clubbing  PSYCH: alert & oriented x 3, fluent speech NEURO: no focal motor/sensory deficits  LABORATORY DATA:  I have reviewed the data as listed    Latest Ref Rng & Units 12/26/2021    1:21 PM 12/15/2021    2:05 PM 08/15/2021    9:15 AM  CBC  WBC 4.0 - 10.5 K/uL 5.0  4.0  3.6   Hemoglobin 12.0 - 15.0 g/dL 10.3  11.2  10.3   Hematocrit 36.0 - 46.0 % 31.5  36.4  32.5   Platelets 150 - 400 K/uL 200  145  231        Latest Ref Rng & Units 12/26/2021    1:21 PM 12/15/2021    2:05 PM 08/15/2021    9:15 AM  CMP  Glucose 70 - 99 mg/dL 113  117  169   BUN 8 - 23 mg/dL '14  14  13   '$ Creatinine 0.44 - 1.00 mg/dL 0.79  0.72  0.83   Sodium 135 - 145 mmol/L 138  139  141   Potassium 3.5 - 5.1 mmol/L 3.4  4.0  3.7   Chloride 98 - 111 mmol/L 104  108  105   CO2 22 - 32 mmol/L '28  22  24   '$ Calcium 8.9 - 10.3 mg/dL 10.1  10.1  9.9   Total Protein 6.5 - 8.1 g/dL 7.5  7.5    Total Bilirubin 0.3 - 1.2 mg/dL 0.5  0.9    Alkaline Phos 38 - 126 U/L 61  55    AST 15 - 41 U/L 16  25    ALT 0 - 44 U/L 5  9       RADIOGRAPHIC STUDIES: I have personally reviewed the radiological images as listed and agreed with the findings in the report. CT ABDOMEN PELVIS W CONTRAST  Result Date: 12/15/2021 CLINICAL DATA:  Abdominal pain; * Tracking Code: BO * EXAM: CT ABDOMEN AND PELVIS WITH CONTRAST TECHNIQUE: Multidetector CT imaging of the abdomen and pelvis was performed using the standard protocol following bolus administration of intravenous contrast. RADIATION DOSE REDUCTION: This exam was performed according to the departmental dose-optimization program which includes automated exposure control, adjustment of the mA and/or kV according to patient size and/or use of iterative reconstruction technique. CONTRAST:  138m OMNIPAQUE IOHEXOL 300 MG/ML  SOLN COMPARISON:  None Available. FINDINGS: Lower chest: Cardiomegaly. Right-greater-than-left lower lobe cylindrical  bronchiectasis. Mild bibasilar linear opacities, likely due to scarring or atelectasis. Small solid pulmonary nodule of the left lower lobe measuring 4 mm on series 4, image 6. Hepatobiliary: No focal liver abnormality is seen. Status post cholecystectomy. Mild biliary ductal dilation. Pancreas: Markedly dilated main pancreatic duct, measuring up to 10 mm at the level of the pancreatic head. Ill-defined soft tissue is seen at the region of the pancreatic head measuring approximally 3.0 x 2.5 cm on series 4, image 31. Less than 180 degree abutment of the SMA Spleen: Normal in size without focal abnormality. Adrenals/Urinary Tract: Bilateral adrenal glands are unremarkable. No hydronephrosis or nephrolithiasis. Bilateral low-attenuation renal lesions, largest are compatible with simple cysts, others are too small to completely characterize, no further follow-up imaging recommended for renal lesions. Stomach/Bowel: Stomach is within normal limits. Calcifications seen at the region of the gastroesophageal junction. Diverticulosis. No evidence of bowel wall thickening, distention, or inflammatory changes. Vascular/Lymphatic: Aortic atherosclerosis. No enlarged abdominal or pelvic lymph nodes. Reproductive: No adnexal masses. Other: No abdominal wall hernia or abnormality. Trace free fluid in the pelvis. Musculoskeletal: Severe degenerative changes of the bilateral hips. No acute or significant osseous findings. IMPRESSION: 1. Marked main pancreatic ductal dilation and mild biliary ductal dilation with ill-defined soft tissue at the pancreatic head, findings are concerning for pancreatic malignancy. Recommend further evaluation with EUS/MRCP. 2. Nonspecific small solid nodule of the left lower lobe. Recommend attention on follow-up. 3. Cardiomegaly and aortic Atherosclerosis (ICD10-I70.0). Electronically Signed   By: Yetta Glassman M.D.   On: 12/15/2021 18:55    ASSESSMENT & PLAN Shannon Ortiz is a 86 y.o. female who  presents to the clinic for evaluation of abnormal CT scan concerning for pancreatic malignancy. We reviewed the CT scans in detail that showed marked pancreatic duct dilatation and underlying ill-defined soft tissue at pancreatic head measuring 3.0 x 2.5 cm. There is less than 180 degree abutment of SMA.   Patient will proceed with serologic workup today to check CBC, CMP, CEA, CA19-9, PT/INR and PTT levels. We will order CT chest to complete staging and evaluate for metastatic disease. Patient is scheduled for EUS and biopsy with Dr. Paulita Fujita on 12/30/2021. Patient will follow up with Dr. Burr Medico after completion of workup to finalized diagnosis and discuss treatment recommendations.   #Pancreatic mass: --Borderline resectable with less than 180 degree abutment of the SMA. No evidence of metastatic disease.  --Labs today to check CBC, CMP, CEA, CA19-9, PT/INR, PTT --Need CT chest to complete staging --EUS with biopsy scheduled on 12/30/2021 with Dr. Paulita Fujita --Will refer patient to pancreatobiliary surgeon to evaluate surgical candidacy.  --RTC in 1 week with labs and follow up with Dr. Burr Medico  #Microcytic anemia: --Labs from 12/15/2021 during ED visit showed Hgb 11.2, MCV 76.3  --Will check Iron panel today  #Constipation: --currently on colace daily with minimal improvement --advised to add miralax daily to her bowel regimen. Prescription sent.  Orders Placed This Encounter  Procedures   CT Chest W Contrast    Standing Status:   Future    Standing Expiration Date:   12/28/2022    Order Specific Question:   If indicated for the ordered procedure, I authorize the administration of contrast media per Radiology protocol    Answer:   Yes    Order Specific Question:   Preferred imaging location?    Answer:   [facility]   CBC with Differential (Cancer  Center Only)    Standing Status:   Future    Number of Occurrences:   1    Standing Expiration Date:   12/27/2022   CMP ([place] only)     Standing Status:   Future    Number of Occurrences:   1    Standing Expiration Date:   12/27/2022   CA 19.9    Standing Status:   Future    Number of Occurrences:   1    Standing Expiration Date:   12/26/2022   CEA (IN HOUSE-[facility])FOR [facility] ONLY    Standing Status:   Future    Number of Occurrences:   1    Standing Expiration Date:   12/27/2022   Ferritin    Standing Status:   Future    Number of Occurrences:   1    Standing Expiration Date:   12/27/2022   Iron and Iron Binding Capacity ([facility] only)    Standing Status:   Future    Number of Occurrences:   1    Standing Expiration Date:   12/27/2022   Protime-INR    Standing Status:   Future    Number of Occurrences:   1    Standing Expiration Date:   12/27/2022   APTT    Standing Status:   Future    Number of Occurrences:   1    Standing Expiration Date:   12/26/2022    All questions were answered. The patient knows to call the clinic with any problems, questions or concerns.  I have spent a total of 60 minutes minutes of face-to-face and non-face-to-face time, preparing to see the patient, obtaining and/or reviewing separately obtained history, performing a medically appropriate examination, counseling and educating the patient, ordering medications/tests, referring and communicating with other health care professionals, documenting clinical information in the electronic health record,  and care coordination.   Shannon Query, PA-C Department of Hematology/Oncology [place] at [facility] Phone: [phone]  Patient was seen with Dr. Burr Medico.  Addendum I have seen the patient, examined her. I agree with the assessment and and plan and have edited the notes.   86 yo female, with PMH of hypertension, dyslipidemia, paroxysmal A-fib, sick sinus syndrome status post pacemaker, on Eliquis, presented to emergency room on January 11, 2022 with fatigue, low appetite and weight loss.  CT scan showed a 3.0 x 2.5  cm mass in the head of pancreas, with abundant meant of the SMA, and markedly dilated main pancreatic duct.  He is scheduled for EUS and biopsy by Dr. Paulita Fujita on 7/18.  We discussed that this is likely pancreatic cancer, probably resectable, and the resectability could be further evaluated by EUS.  We discussed that resection will require Whipple surgery, which will be a challenge for her age and medical comorbidities.  We also discussed high risk of recurrence with surgery alone, and most patients will require neoadjuvant or adjuvant chemotherapy.  We discussed radiation, especially SBRT, could be considered as local therapy for disease control, but will unlikely cure her cancer.  Due to her advanced age and limited performance status, she is not a candidate for intensive chemo, but a single agent gemcitabine could be considered for palliative purposes.  Patient and her family members wanted to think about the options, I plan to see her back when the biopsy results back, and I will refer her to pancreatobiliary surgeon Dr. Barry Dienes or Dr. Zenia Resides for consultation.  She agrees with  the plan.  We will discuss her case in GI tumor conference.  Truitt Merle MD 12/26/2021

## 2021-12-26 NOTE — Telephone Encounter (Signed)
Patient with diagnosis of afib on Eliquis for anticoagulation.    Procedure: upper endoscopy Date of procedure: 7/18  CHA2DS2-VASc Score = 5  This indicates a 7.2% annual risk of stroke. The patient's score is based upon: CHF History: 0 HTN History: 1 Diabetes History: 0 Stroke History: 0 Vascular Disease History: 1 Age Score: 2 Gender Score: 1   Aortic atherosclerosis noted on abdominal CT 12/2021, PMH updated.  CrCl 35m/min Platelet count 145K  Per office protocol, patient can hold Eliquis for 1-2 days prior to procedure.    **This guidance is not considered finalized until pre-operative APP has relayed final recommendations.**

## 2021-12-26 NOTE — Progress Notes (Signed)
I met with Ms Tyer and her daughter during  her consultation with Dede Query, PA-C and  Dr Burr Medico in the rapid diagnostic clinic.  I explained my role as a nurse navigator and provided my contact information. I explained the services provided at [facility] and provided written information.  I explained the alight grant and let  her know one of the financial advisors will reach out to  her at the time of her chemo education class if that is her choice. I briefly explained insertion and care of a port a cath.  I showed a sample of the port a cath. I told them that she will be scheduled for chemotherapy education class prior to receiving chemotherapy.  All questions were answered. They verbalized understanding.

## 2021-12-26 NOTE — Telephone Encounter (Signed)
Left message for the pt call back for tele visit that will need to be today as procedure is 7/18 and STAT, as well as pt is blood thinner.

## 2021-12-27 LAB — CANCER ANTIGEN 19-9: CA 19-9: 171 U/mL — ABNORMAL HIGH (ref 0–35)

## 2021-12-28 MED ORDER — POLYETHYLENE GLYCOL 3350 17 G PO PACK: 17.0000 g | PACK | Freq: Every day | ORAL | 3 refills | Status: DC

## 2021-12-29 ENCOUNTER — Telehealth: Payer: Self-pay | Admitting: Physician Assistant

## 2021-12-29 ENCOUNTER — Other Ambulatory Visit: Payer: Self-pay | Admitting: Gastroenterology

## 2021-12-29 ENCOUNTER — Other Ambulatory Visit: Payer: Self-pay

## 2021-12-29 ENCOUNTER — Telehealth: Payer: Self-pay | Admitting: Hematology

## 2021-12-29 DIAGNOSIS — K8689 Other specified diseases of pancreas: Secondary | ICD-10-CM

## 2021-12-29 NOTE — Telephone Encounter (Signed)
Scheduled appointment per 07/14 los. Patient aware.  

## 2021-12-29 NOTE — Telephone Encounter (Signed)
Rescheduled upcoming appointment per patient's preference. Patient is aware of changes.

## 2021-12-29 NOTE — Progress Notes (Signed)
I scheduled Shannon Ortiz Ct chest.  I spoke with her daugher Shannon Ortiz and reviewed appt location date and time, [facility] 7/19 arrive at 1645. I told her Shannon Ortiz can only have liquids after 1300 on 7/19.  All questions were answered.  She verbalized understanding.

## 2021-12-29 NOTE — Progress Notes (Signed)
Order placed per Dede Query PA-C request.

## 2021-12-30 ENCOUNTER — Ambulatory Visit (HOSPITAL_BASED_OUTPATIENT_CLINIC_OR_DEPARTMENT_OTHER): Payer: Medicare Other | Admitting: Anesthesiology

## 2021-12-30 ENCOUNTER — Encounter (HOSPITAL_COMMUNITY)
Admission: RE | Disposition: A | Discharge status: Home / Self Care | Payer: Self-pay | Source: Home / Self Care | Attending: Gastroenterology

## 2021-12-30 ENCOUNTER — Encounter (HOSPITAL_COMMUNITY): Payer: Self-pay | Admitting: Gastroenterology

## 2021-12-30 ENCOUNTER — Ambulatory Visit (HOSPITAL_COMMUNITY)
Admission: RE | Admit: 2021-12-30 | Discharge: 2021-12-30 | Disposition: A | Discharge status: Home / Self Care | Payer: Medicare Other | Attending: Gastroenterology | Admitting: Gastroenterology

## 2021-12-30 ENCOUNTER — Other Ambulatory Visit: Payer: Self-pay

## 2021-12-30 ENCOUNTER — Ambulatory Visit (HOSPITAL_COMMUNITY): Payer: Medicare Other | Admitting: Anesthesiology

## 2021-12-30 DIAGNOSIS — M797 Fibromyalgia: Secondary | ICD-10-CM | POA: Insufficient documentation

## 2021-12-30 DIAGNOSIS — Z950 Presence of cardiac pacemaker: Secondary | ICD-10-CM | POA: Insufficient documentation

## 2021-12-30 DIAGNOSIS — Z7901 Long term (current) use of anticoagulants: Secondary | ICD-10-CM | POA: Diagnosis not present

## 2021-12-30 DIAGNOSIS — E039 Hypothyroidism, unspecified: Secondary | ICD-10-CM | POA: Insufficient documentation

## 2021-12-30 DIAGNOSIS — K219 Gastro-esophageal reflux disease without esophagitis: Secondary | ICD-10-CM | POA: Insufficient documentation

## 2021-12-30 DIAGNOSIS — K8689 Other specified diseases of pancreas: Secondary | ICD-10-CM | POA: Diagnosis not present

## 2021-12-30 DIAGNOSIS — R933 Abnormal findings on diagnostic imaging of other parts of digestive tract: Secondary | ICD-10-CM | POA: Diagnosis not present

## 2021-12-30 DIAGNOSIS — D638 Anemia in other chronic diseases classified elsewhere: Secondary | ICD-10-CM

## 2021-12-30 DIAGNOSIS — I480 Paroxysmal atrial fibrillation: Secondary | ICD-10-CM

## 2021-12-30 DIAGNOSIS — Z79899 Other long term (current) drug therapy: Secondary | ICD-10-CM | POA: Insufficient documentation

## 2021-12-30 DIAGNOSIS — I10 Essential (primary) hypertension: Secondary | ICD-10-CM | POA: Diagnosis not present

## 2021-12-30 DIAGNOSIS — F419 Anxiety disorder, unspecified: Secondary | ICD-10-CM | POA: Insufficient documentation

## 2021-12-30 DIAGNOSIS — C250 Malignant neoplasm of head of pancreas: Secondary | ICD-10-CM | POA: Insufficient documentation

## 2021-12-30 DIAGNOSIS — M1990 Unspecified osteoarthritis, unspecified site: Secondary | ICD-10-CM | POA: Diagnosis not present

## 2021-12-30 HISTORY — PX: UPPER ESOPHAGEAL ENDOSCOPIC ULTRASOUND (EUS): SHX6562

## 2021-12-30 HISTORY — PX: FINE NEEDLE ASPIRATION: SHX5430

## 2021-12-30 HISTORY — PX: ESOPHAGOGASTRODUODENOSCOPY (EGD) WITH PROPOFOL: SHX5813

## 2021-12-30 SURGERY — UPPER ESOPHAGEAL ENDOSCOPIC ULTRASOUND (EUS)
Anesthesia: Monitor Anesthesia Care

## 2021-12-30 MED ORDER — LIDOCAINE HCL (CARDIAC) PF 100 MG/5ML IV SOSY
PREFILLED_SYRINGE | INTRAVENOUS | Status: DC | PRN
  Administered 2021-12-30: 100 mg via INTRAVENOUS

## 2021-12-30 MED ORDER — PROPOFOL 500 MG/50ML IV EMUL
INTRAVENOUS | Status: DC | PRN
  Administered 2021-12-30: 80 ug/kg/min via INTRAVENOUS

## 2021-12-30 MED ORDER — PROPOFOL 10 MG/ML IV BOLUS
INTRAVENOUS | Status: DC | PRN
  Administered 2021-12-30 (×5): 20 mg via INTRAVENOUS
  Administered 2021-12-30: 10 mg via INTRAVENOUS
  Administered 2021-12-30 (×3): 20 mg via INTRAVENOUS
  Administered 2021-12-30 (×2): 10 mg via INTRAVENOUS
  Administered 2021-12-30 (×2): 20 mg via INTRAVENOUS
  Administered 2021-12-30: 10 mg via INTRAVENOUS

## 2021-12-30 MED ORDER — LACTATED RINGERS IV SOLN
INTRAVENOUS | Status: AC | PRN
  Administered 2021-12-30: 1000 mL via INTRAVENOUS

## 2021-12-30 MED ORDER — APIXABAN 5 MG PO TABS: 5.0000 mg | ORAL_TABLET | Freq: Two times a day (BID) | ORAL | 10 refills | Status: DC

## 2021-12-30 MED ORDER — LACTATED RINGERS IV SOLN: INTRAVENOUS | Status: DC

## 2021-12-30 NOTE — Anesthesia Postprocedure Evaluation (Signed)
Anesthesia Post Note  Patient: Shannon Ortiz  Procedure(s) Performed: UPPER ESOPHAGEAL ENDOSCOPIC ULTRASOUND (EUS) (Bilateral) FINE NEEDLE ASPIRATION (FNA) LINEAR     Patient location during evaluation: Endoscopy Anesthesia Type: MAC Level of consciousness: oriented, awake and alert and awake Pain management: pain level controlled Vital Signs Assessment: post-procedure vital signs reviewed and stable Respiratory status: spontaneous breathing, nonlabored ventilation, respiratory function stable and patient connected to nasal cannula oxygen Cardiovascular status: blood pressure returned to baseline and stable Postop Assessment: no headache, no backache and no apparent nausea or vomiting Anesthetic complications: no   No notable events documented.  Last Vitals:  Vitals:   12/30/21 1327 12/30/21 1334  BP: (!) 108/59 (!) 110/54  Pulse: 62 62  Resp: (!) 23 16  Temp:    SpO2: 96% 96%    Last Pain:  Vitals:   12/30/21 1334  TempSrc:   PainSc: 0-No pain                 Santa Lighter

## 2021-12-30 NOTE — Op Note (Signed)
[facility] Patient Name: Nataly Pacifico Procedure Date: 12/30/2021 MRN: [id number] Attending MD: Arta Silence , MD Date of Birth: 1936-02-11 CSN: [id number] Age: 86 Admit Type: Outpatient Procedure:                Upper EUS Indications:              Suspected mass in pancreas on CT scan Providers:                Arta Silence, MD, Burtis Junes, RN, Gloris Ham, Technician Referring MD:             Dr. Ronnette Juniper Medicines:                Monitored Anesthesia Care Complications:            No immediate complications. Estimated Blood Loss:     Estimated blood loss: none. Procedure:                Pre-Anesthesia Assessment:                           - Prior to the procedure, a History and Physical                            was performed, and patient medications and                            allergies were reviewed. The patient's tolerance of                            previous anesthesia was also reviewed. The risks                            and benefits of the procedure and the sedation                            options and risks were discussed with the patient.                            All questions were answered, and informed consent                            was obtained. Prior Anticoagulants: The patient has                            taken Eliquis (apixaban), last dose was 3 days                            prior to procedure. ASA Grade Assessment: III - A                            patient with severe systemic disease. After  reviewing the risks and benefits, the patient was                            deemed in satisfactory condition to undergo the                            procedure.                           After obtaining informed consent, the endoscope was                            passed under direct vision. Throughout the                            procedure, the patient's blood pressure,  pulse, and                            oxygen saturations were monitored continuously. The                            [id number] ([id number] Olympus linear ultrasound scope                            was introduced through the mouth, and advanced to                            the second part of duodenum. The upper EUS was                            accomplished without difficulty. The patient                            tolerated the procedure well. Scope In: Scope Out: Findings:      ENDOSONOGRAPHIC FINDING: :      An irregular mass was identified in the pancreatic head. The mass was       hypoechoic. The mass measured 25 mm by 30 mm in maximal cross-sectional       diameter. The endosonographic borders were poorly-defined. There was       sonographic evidence suggesting invasion into the superior mesenteric       artery (manifested by abutment) and the celiac trunk (manifested by no       vessel visualization). An intact interface was seen between the mass and       the portal vein suggesting a lack of invasion. The remainder of the       pancreas was examined. The endosonographic appearance of parenchyma and       the upstream pancreatic duct indicated duct dilation. Fine needle       aspiration for cytology was performed. Color Doppler imaging was       utilized prior to needle puncture to confirm a lack of significant       vascular structures within the needle path. Four passes were made with       the 25 gauge needle using a transduodenal approach. A stylet was used. A  cytologist was present and performed a preliminary cytologic       examination. Final cytology results are pending.      No lymphadenopathy seen.      There was no sign of significant endosonographic abnormality in the left       lobe of the liver. Impression:               - A mass was identified in the pancreatic head.                            This was staged T3 (T4 if SMA invasion) N0 Mx by                             endosonographic criteria. Fine needle aspiration                            performed.                           - There was no evidence of significant pathology in                            the left lobe of the liver. Moderate Sedation:      None Recommendation:           - Discharge patient to home (ambulatory).                           - Resume previous diet today.                           - Resume Eliquis (apixaban) at prior dose in 2 days                            (start back Thursday July 20th).                           - Await cytology results.                           - Return to GI clinic after studies are complete. Procedure Code(s):        --- Professional ---                           [phone], Esophagogastroduodenoscopy, flexible,                            transoral; with transendoscopic ultrasound-guided                            intramural or transmural fine needle                            aspiration/biopsy(s) (includes endoscopic                            ultrasound examination of the esophagus,  stomach,                            and either the duodenum or a surgically altered                            stomach where the jejunum is examined distal to the                            anastomosis) Diagnosis Code(s):        --- Professional ---                           K86.89, Other specified diseases of pancreas                           R93.3, Abnormal findings on diagnostic imaging of                            other parts of digestive tract CPT copyright 2019 [employer]. All rights reserved. The codes documented in this report are preliminary and upon coder review may  be revised to meet current compliance requirements. Arta Silence, MD 12/30/2021 1:16:58 PM This report has been signed electronically. Number of Addenda: 0

## 2021-12-30 NOTE — Anesthesia Preprocedure Evaluation (Addendum)
Anesthesia Evaluation  Patient identified by MRN, date of birth, ID band Patient awake    Reviewed: Allergy & Precautions, NPO status , Patient's Chart, lab work & pertinent test results, reviewed documented beta blocker date and time   Airway Mallampati: II  TM Distance: >3 FB Neck ROM: Full    Dental  (+) Dental Advisory Given, Edentulous Lower, Edentulous Upper   Pulmonary neg pulmonary ROS,    Pulmonary exam normal breath sounds clear to auscultation       Cardiovascular hypertension, Pt. on medications and Pt. on home beta blockers Normal cardiovascular exam+ pacemaker (PPM for SND)  Rhythm:Regular Rate:Normal     Neuro/Psych PSYCHIATRIC DISORDERS Anxiety negative neurological ROS     GI/Hepatic hiatal hernia, GERD  Medicated,Pancreatic mass   Endo/Other  Hypothyroidism   Renal/GU negative Renal ROS     Musculoskeletal  (+) Arthritis , Fibromyalgia -  Abdominal   Peds  Hematology  (+) Blood dyscrasia (Eliquis), anemia ,   Anesthesia Other Findings Day of surgery medications reviewed with the patient.  Reproductive/Obstetrics                            Anesthesia Physical Anesthesia Plan  ASA: 3  Anesthesia Plan: MAC   Post-op Pain Management: Minimal or no pain anticipated   Induction: Intravenous  PONV Risk Score and Plan: 2 and TIVA and Treatment may vary due to age or medical condition  Airway Management Planned: Nasal Cannula and Natural Airway  Additional Equipment:   Intra-op Plan:   Post-operative Plan:   Informed Consent: I have reviewed the patients History and Physical, chart, labs and discussed the procedure including the risks, benefits and alternatives for the proposed anesthesia with the patient or authorized representative who has indicated his/her understanding and acceptance.     Dental advisory given  Plan Discussed with: CRNA and  Anesthesiologist  Anesthesia Plan Comments:         Anesthesia Quick Evaluation

## 2021-12-30 NOTE — Discharge Instructions (Addendum)
YOU HAD AN ENDOSCOPIC PROCEDURE TODAY: Refer to the procedure report and other information in the discharge instructions given to you for any specific questions about what was found during the examination. If this information does not answer your questions, please call the [facility] office at [phone] to clarify.   YOU SHOULD EXPECT: Some feelings of bloating in the abdomen. Passage of more gas than usual. Walking can help get rid of the air that was put into your GI tract during the procedure and reduce the bloating.  DIET: Your first meal following the procedure should be a light meal and then it is ok to progress to your normal diet. A half-sandwich or bowl of soup is an example of a good first meal. Heavy or fried foods are harder to digest and may make you feel nauseous or bloated. Drink plenty of fluids but you should avoid alcoholic beverages for 24 hours.   ACTIVITY: Your care partner should take you home directly after the procedure. You should plan to take it easy, moving slowly for the rest of the day. You can resume normal activity the day after the procedure however YOU SHOULD NOT DRIVE, use power tools, machinery or perform tasks that involve climbing or major physical exertion for 24 hours (because of the sedation medicines used during the test).   SYMPTOMS TO REPORT IMMEDIATELY: A gastroenterologist can be reached at any hour. Please call [phone]  for any of the following symptoms:   Following upper endoscopy (EGD, EUS, ERCP, esophageal dilation) Vomiting of blood or coffee ground material  New, significant abdominal pain  New, significant chest pain or pain under the shoulder blades  Painful or persistently difficult swallowing  New shortness of breath  Black, tarry-looking or red, bloody stools  FOLLOW UP:  If any biopsies were taken you will be contacted by phone or by letter within the next 1-3 weeks. Call [phone]  if you have not heard about the biopsies in 3  weeks.  Please also call with any specific questions about appointments or follow up tests. 

## 2021-12-30 NOTE — Transfer of Care (Signed)
Immediate Anesthesia Transfer of Care Note  Patient: Shannon Ortiz  Procedure(s) Performed: UPPER ESOPHAGEAL ENDOSCOPIC ULTRASOUND (EUS) (Bilateral) FINE NEEDLE ASPIRATION (FNA) LINEAR  Patient Location: Endoscopy Unit  Anesthesia Type:MAC  Level of Consciousness: drowsy  Airway & Oxygen Therapy: Patient Spontanous Breathing  Post-op Assessment: Report given to RN and Post -op Vital signs reviewed and stable  Post vital signs: Reviewed and stable  Last Vitals:  Vitals Value Taken Time  BP 100/54 1315  Temp    Pulse 60 1315  Resp 22 1315  SpO2 95% 1315    Last Pain:  Vitals:   12/30/21 1104  TempSrc: Tympanic  PainSc: 0-No pain         Complications: No notable events documented.

## 2021-12-30 NOTE — H&P (Signed)
[name] Admission Note  Chief Complaint: pancreatic mass  HPI: Shannon Ortiz is an 86 y.o. female.  Weight loss and anorexia.  CT worrisome for pancreatic mass in head of pancreas with upstream pancreatic ductal dilatation.  Past Medical History:  Diagnosis Date   Acquired diverticulum of distal esophagus with dysphagia 10/03/2018   AKI (acute kidney injury) ([place]) 08/31/2018   Anxiety    Bradycardia 02/16/2014   Chest tightness    DDD (degenerative disc disease), lumbar    DJD (degenerative joint disease)    Dry eyes    Esophageal stenosis 08/31/2018   Facial pain    Fibromyalgia    Hemorrhoid    Hypertension    Hypokalemia 08/31/2018   Hypothyroidism    Lightheadedness    Near syncope 02/16/2014   Osteopenia    Sigmoid diverticulosis    Thyroid nodule    PT DENIES    Past Surgical History:  Procedure Laterality Date   ABDOMINAL HYSTERECTOMY     COMPLETE   BALLOON DILATION N/A 09/01/2018   Procedure: BALLOON DILATION;  Surgeon: Ronnette Juniper, MD;  Location: [facility];  Service: Gastroenterology;  Laterality: N/A;   BIOPSY  09/01/2018   Procedure: BIOPSY;  Surgeon: Ronnette Juniper, MD;  Location: [facility] ENDOSCOPY;  Service: Gastroenterology;;   BOTOX INJECTION  09/01/2018   Procedure: BOTOX INJECTION;  Surgeon: Ronnette Juniper, MD;  Location: [facility] ENDOSCOPY;  Service: Gastroenterology;;   BREAST SURGERY     breast biopsy   CHOLECYSTECTOMY     ESOPHAGEAL MANOMETRY N/A 10/07/2018   Procedure: ESOPHAGEAL MANOMETRY (EM);  Surgeon: Ronnette Juniper, MD;  Location: [facility] ENDOSCOPY;  Service: Gastroenterology;  Laterality: N/A;   ESOPHAGOGASTRODUODENOSCOPY N/A 10/25/2018   Procedure: ESOPHAGOGASTRODUODENOSCOPY (EGD);  Surgeon: Michael Boston, MD;  Location: [facility]S;  Service: General;  Laterality: N/A;   ESOPHAGOGASTRODUODENOSCOPY (EGD) WITH PROPOFOL N/A 09/01/2018   Procedure: ESOPHAGOGASTRODUODENOSCOPY (EGD) WITH PROPOFOL;  Surgeon: Ronnette Juniper, MD;  Location: [facility];  Service:  Gastroenterology;  Laterality: N/A;   PACEMAKER IMPLANT N/A 03/06/2019   Procedure: PACEMAKER IMPLANT;  Surgeon: Evans Lance, MD;  Location: [facility];  Service: Cardiovascular;  Laterality: N/A;    Medications Prior to Admission  Medication Sig Dispense Refill   amLODipine (NORVASC) 10 MG tablet Take 1 tablet (10 mg total) by mouth daily. 90 tablet 3   apixaban (ELIQUIS) 5 MG TABS tablet Take 1 tablet by mouth twice daily 60 tablet 10   docusate sodium (COLACE) 100 MG capsule Take 100 mg by mouth 2 (two) times daily.     feeding supplement (ENSURE SURGERY) LIQD Take 237 mLs by mouth 2 (two) times daily between meals. (Patient taking differently: Take 237 mLs by mouth 3 (three) times a week.) 60 Bottle 0   losartan (COZAAR) 100 MG tablet Take 100 mg by mouth in the morning.     metoprolol succinate (TOPROL-XL) 50 MG 24 hr tablet Take 50 mg by mouth in the morning.     ondansetron (ZOFRAN-ODT) 4 MG disintegrating tablet Take 4 mg by mouth every 8 (eight) hours as needed.     pantoprazole (PROTONIX) 20 MG tablet Take 20 mg by mouth daily as needed (acid reflux.).     polyethylene glycol (MIRALAX) 17 g packet Take 17 g by mouth daily. 30 each 3    Allergies:  Allergies  Allergen Reactions   Ace Inhibitors Cough   Elavil [Amitriptyline Hcl] Hives   Ketek [Telithromycin] Hives    Family History  Problem Relation Age  of Onset   Hypertension Mother    Diabetes Mother        non-insulin dependant   CAD Brother    Cancer Maternal Aunt    Cancer Maternal Uncle     Social History:  reports that she has never smoked. She has never used smokeless tobacco. She reports that she does not drink alcohol and does not use drugs.   ROS: As per HPI, all others negative   Blood pressure (!) 176/88, pulse 79, temperature (!) 97.5 F (36.4 C), temperature source Tympanic, resp. rate 19, height '5\' 8"'$  (1.727 m), weight 58.9 kg, SpO2 100 %. General appearance: NAD HEENT:  [place]/AT,  anicteric SKIN:  No jaundice NEURO:  A/O, no encephalopathy ABD:  Soft, non-tender CV:  Regular  No results found for this or any previous visit (from the past 48 hour(s)). No results found.  Assessment/Plan   Weight loss and poor appetite. Abnormal CT scan, concern for pancreatic mass head with upstream pancreatic ductal dilatation. Chronic anticoagulation, Eliquis, on hold x 3 days. Endoscopic ultrasound with possible fine needle aspiration today. Risks (bleeding, infection, bowel perforation that could require surgery, sedation-related changes in cardiopulmonary systems), benefits (identification and possible treatment of source of symptoms, exclusion of certain causes of symptoms), and alternatives (watchful waiting, radiographic imaging studies, empiric medical treatment) of upper endoscopy with ultrasound and possible fine needle aspiration (EUS +/- FNA) were explained to patient/family in detail and patient wishes to proceed.   Shannon Ortiz 12/30/2021, 11:57 AM

## 2021-12-31 ENCOUNTER — Ambulatory Visit (HOSPITAL_COMMUNITY): Payer: Medicare Other

## 2021-12-31 ENCOUNTER — Other Ambulatory Visit: Payer: Self-pay

## 2021-12-31 ENCOUNTER — Encounter (HOSPITAL_COMMUNITY): Payer: Self-pay | Admitting: Gastroenterology

## 2021-12-31 LAB — CYTOLOGY - NON PAP

## 2021-12-31 NOTE — Progress Notes (Signed)
Left Shannon Ortiz a message requesting call back regarding her mothers Ct Chest.

## 2021-12-31 NOTE — Progress Notes (Signed)
The proposed treatment discussed in conference is for discussion purpose only and is not a binding recommendation.  The patients have not been physically examined, or presented with their treatment options.  Therefore, final treatment plans cannot be decided.  

## 2022-01-01 NOTE — Progress Notes (Signed)
I left vm for Ms Cotten daughter [name] to call me.

## 2022-01-02 NOTE — Progress Notes (Signed)
I spoke with Shannon Nachtigal daughter, [name].  They are declining the chest CT at this time.  I explained the reason for the Ct Scan but they still declined.  Shannon Ortiz has an appt with D r Burr Medico this coming Friday at which time she will discuss with her.

## 2022-01-05 ENCOUNTER — Other Ambulatory Visit: Payer: Medicare Other

## 2022-01-05 ENCOUNTER — Ambulatory Visit: Payer: Medicare Other | Admitting: Hematology

## 2022-01-05 ENCOUNTER — Other Ambulatory Visit: Payer: Self-pay | Admitting: Genetic Counselor

## 2022-01-05 DIAGNOSIS — Z1379 Encounter for other screening for genetic and chromosomal anomalies: Secondary | ICD-10-CM

## 2022-01-08 ENCOUNTER — Other Ambulatory Visit: Payer: Self-pay

## 2022-01-08 DIAGNOSIS — K8689 Other specified diseases of pancreas: Secondary | ICD-10-CM

## 2022-01-08 DIAGNOSIS — D509 Iron deficiency anemia, unspecified: Secondary | ICD-10-CM

## 2022-01-09 ENCOUNTER — Inpatient Hospital Stay: Payer: Medicare Other

## 2022-01-09 ENCOUNTER — Inpatient Hospital Stay (HOSPITAL_BASED_OUTPATIENT_CLINIC_OR_DEPARTMENT_OTHER): Payer: Medicare Other | Admitting: Hematology

## 2022-01-09 ENCOUNTER — Other Ambulatory Visit: Payer: Self-pay

## 2022-01-09 ENCOUNTER — Encounter: Payer: Self-pay | Admitting: Hematology

## 2022-01-09 DIAGNOSIS — C259 Malignant neoplasm of pancreas, unspecified: Secondary | ICD-10-CM | POA: Insufficient documentation

## 2022-01-09 DIAGNOSIS — D509 Iron deficiency anemia, unspecified: Secondary | ICD-10-CM | POA: Diagnosis not present

## 2022-01-09 DIAGNOSIS — Z7901 Long term (current) use of anticoagulants: Secondary | ICD-10-CM | POA: Diagnosis not present

## 2022-01-09 DIAGNOSIS — I700 Atherosclerosis of aorta: Secondary | ICD-10-CM | POA: Diagnosis not present

## 2022-01-09 DIAGNOSIS — I517 Cardiomegaly: Secondary | ICD-10-CM | POA: Diagnosis not present

## 2022-01-09 DIAGNOSIS — Z79899 Other long term (current) drug therapy: Secondary | ICD-10-CM | POA: Diagnosis not present

## 2022-01-09 DIAGNOSIS — K5900 Constipation, unspecified: Secondary | ICD-10-CM | POA: Diagnosis not present

## 2022-01-09 DIAGNOSIS — M8580 Other specified disorders of bone density and structure, unspecified site: Secondary | ICD-10-CM | POA: Diagnosis not present

## 2022-01-09 DIAGNOSIS — E785 Hyperlipidemia, unspecified: Secondary | ICD-10-CM | POA: Diagnosis not present

## 2022-01-09 DIAGNOSIS — C250 Malignant neoplasm of head of pancreas: Secondary | ICD-10-CM

## 2022-01-09 DIAGNOSIS — Z8249 Family history of ischemic heart disease and other diseases of the circulatory system: Secondary | ICD-10-CM | POA: Diagnosis not present

## 2022-01-09 DIAGNOSIS — M797 Fibromyalgia: Secondary | ICD-10-CM | POA: Diagnosis not present

## 2022-01-09 DIAGNOSIS — R5383 Other fatigue: Secondary | ICD-10-CM | POA: Diagnosis not present

## 2022-01-09 DIAGNOSIS — Z809 Family history of malignant neoplasm, unspecified: Secondary | ICD-10-CM | POA: Diagnosis not present

## 2022-01-09 DIAGNOSIS — Z8719 Personal history of other diseases of the digestive system: Secondary | ICD-10-CM | POA: Diagnosis not present

## 2022-01-09 DIAGNOSIS — Z1379 Encounter for other screening for genetic and chromosomal anomalies: Secondary | ICD-10-CM

## 2022-01-09 DIAGNOSIS — Z8507 Personal history of malignant neoplasm of pancreas: Secondary | ICD-10-CM | POA: Diagnosis not present

## 2022-01-09 DIAGNOSIS — Z833 Family history of diabetes mellitus: Secondary | ICD-10-CM | POA: Diagnosis not present

## 2022-01-09 DIAGNOSIS — I10 Essential (primary) hypertension: Secondary | ICD-10-CM | POA: Diagnosis not present

## 2022-01-09 DIAGNOSIS — Z9049 Acquired absence of other specified parts of digestive tract: Secondary | ICD-10-CM | POA: Diagnosis not present

## 2022-01-09 DIAGNOSIS — K8689 Other specified diseases of pancreas: Secondary | ICD-10-CM

## 2022-01-09 LAB — CBC WITH DIFFERENTIAL (CANCER CENTER ONLY)
Abs Immature Granulocytes: 0.01 10*3/uL (ref 0.00–0.07)
Basophils Absolute: 0 10*3/uL (ref 0.0–0.1)
Basophils Relative: 1 %
Eosinophils Absolute: 0 10*3/uL (ref 0.0–0.5)
Eosinophils Relative: 1 %
HCT: 30.8 % — ABNORMAL LOW (ref 36.0–46.0)
Hemoglobin: 10.2 g/dL — ABNORMAL LOW (ref 12.0–15.0)
Immature Granulocytes: 0 %
Lymphocytes Relative: 50 %
Lymphs Abs: 1.7 10*3/uL (ref 0.7–4.0)
MCH: 23.6 pg — ABNORMAL LOW (ref 26.0–34.0)
MCHC: 33.1 g/dL (ref 30.0–36.0)
MCV: 71.1 fL — ABNORMAL LOW (ref 80.0–100.0)
Monocytes Absolute: 0.3 10*3/uL (ref 0.1–1.0)
Monocytes Relative: 8 %
Neutro Abs: 1.4 10*3/uL — ABNORMAL LOW (ref 1.7–7.7)
Neutrophils Relative %: 40 %
Platelet Count: 253 10*3/uL (ref 150–400)
RBC: 4.33 MIL/uL (ref 3.87–5.11)
RDW: 16.9 % — ABNORMAL HIGH (ref 11.5–15.5)
WBC Count: 3.4 10*3/uL — ABNORMAL LOW (ref 4.0–10.5)
nRBC: 0 % (ref 0.0–0.2)

## 2022-01-09 LAB — CMP (CANCER CENTER ONLY)
ALT: 5 U/L (ref 0–44)
AST: 16 U/L (ref 15–41)
Albumin: 4.2 g/dL (ref 3.5–5.0)
Alkaline Phosphatase: 52 U/L (ref 38–126)
Anion gap: 6 (ref 5–15)
BUN: 16 mg/dL (ref 8–23)
CO2: 27 mmol/L (ref 22–32)
Calcium: 9.7 mg/dL (ref 8.9–10.3)
Chloride: 106 mmol/L (ref 98–111)
Creatinine: 0.81 mg/dL (ref 0.44–1.00)
GFR, Estimated: >60 mL/min (ref >60)
Glucose, Bld: 125 mg/dL — ABNORMAL HIGH (ref 70–99)
Potassium: 3.5 mmol/L (ref 3.5–5.1)
Sodium: 139 mmol/L (ref 135–145)
Total Bilirubin: 0.5 mg/dL (ref 0.3–1.2)
Total Protein: 7.2 g/dL (ref 6.5–8.1)

## 2022-01-09 LAB — GENETIC SCREENING ORDER

## 2022-01-09 NOTE — Progress Notes (Signed)
[place]   Telephone:(336) [phone] Fax:(336) [phone]   Clinic Follow up Note   Patient Care Team: Shannon Cruel, MD as PCP - General (Family Medicine) Shannon Hector, MD as PCP - Cardiology (Cardiology) Shannon Lance, MD as PCP - Electrophysiology (Cardiology) Shannon Boston, MD as Consulting Physician (General Surgery) Shannon Juniper, MD as Consulting Physician (Gastroenterology) Shannon Ortiz as Physician Assistant (Hematology and Oncology) Shannon Merle, MD as Consulting Physician (Oncology) Shannon Bake, RN as Oncology Nurse Navigator (Oncology)  Date of Service:  01/09/2022  CHIEF COMPLAINT: f/u of pancreatic Ortiz  CURRENT THERAPY:  Pending  ASSESSMENT & PLAN:  Shannon Ortiz is a 86 y.o. female with   1. Pancreatic Ortiz, stage IB, T3/4 N0 by EUS -presented to ED 12/15/21 with constipation, decreased appetite (with weight loss of 10-14 lbs in 4 months), and weakness. CT AP showed pancreatic and biliary ductal dilation and soft tissue at pancreatic head. -EUS with biopsy on 12/30/21 by Dr. Paulita Fujita, showed 3 cm pancreatic head mass, staged T3 (T4 if SMA invasion) N0. Cytology confirmed adenocarcinoma. -staging chest CT was recommended, but they declined because she is not sure if she wants Ortiz treatment  -I reviewed the results and work up with patient and her family today. We discussed potential treatment options, including surgery, chemotherapy, and radiation therapy.  Due to the probable arterial invasion, her pancreatic Ortiz is at least borderline resectable, possible unresectable.  We will review her case in our GI conference next week and get a surgical opinion.  Due to her advanced age and limited performance status, she is not a candidate for Whipple surgery.   -I explained that she would not be considered for surgery alone/upfront given the abutment and possible invasion, of nearby arteries (SMV and possible celiac trunk); she would need to  at least try chemo before being considered for surgery.  I also discussed the role of palliative chemotherapy, such as single agent gemcitabine, if she wants to try chemo.  She is not a candidate for intensive chemotherapy such as FOLFIRINOX, or even gemcitabine/abraxane.  -If no surgery, I would recommend her to consider radiation will control.  Can do radiation alone, or more commonly, get chemotherapy for 3 to 4 months before radiation. -I also discussed supportive care alone, with symptom management, to preserve her quality of life.  I reviewed the option of hospice with her and her family, if she decides not to pursue any Ortiz treatment. -Patient is not sure what she would like to do, she states she rather have quality of life.  Both her daughter and son Shannon Ortiz are supportive, Shannon Ortiz clearly stated that he would like her mother to try Ortiz treatment, including chemo and surgery.  After lengthy discussion, patient and her family decided to take more time to think about options, and to meet me again next week to finalize the plan. -labs reviewed, her CMP is WNL, which is encouraging.    2. Goal of care discussion  -We discussed the nature of her Ortiz in the setting of her advanced age, especially if she does not have good response to or progress on chemotherapy. We reviewed the balance between quality of life and prolonging her life.  3. Symptom Management: Constipation, Gas, Anemia, Low appetite -secondary to #1 -she endorses using stool softener twice daily. -I advised her to try Gas-X for her excess gas.   4. Genetics -we drew genetic panel today. I explained that this could  PLAN: -Biopsy results reviewed, we discussed treatment options.  Patient and her family wants to think about  -Tumor board discussion next week.8/2  -f/u on 8/2 at 4pm to finalize her plan    No problem-specific Assessment & Plan notes found for this encounter.   SUMMARY OF ONCOLOGIC  HISTORY: Oncology History Overview Note   Ortiz Staging  Pancreatic Ortiz [facility]) Staging form: Exocrine Pancreas, AJCC 8th Edition - Clinical stage from 12/30/2021: Stage IB (cT2, cN0, cM0) - Signed by Shannon Merle, MD on 01/09/2022    Pancreatic Ortiz ([place])  12/15/2021 Imaging   CLINICAL DATA:  Abdominal pain   EXAM: CT ABDOMEN AND PELVIS WITH CONTRAST  IMPRESSION: 1. Marked main pancreatic ductal dilation and mild biliary ductal dilation with ill-defined soft tissue at the pancreatic head, findings are concerning for pancreatic malignancy. Recommend further evaluation with EUS/MRCP. 2. Nonspecific small solid nodule of the left lower lobe. Recommend attention on follow-up. 3. Cardiomegaly and aortic Atherosclerosis (ICD10-I70.0).   12/30/2021 Procedure   Upper EUS, Dr. Paulita Fujita  Impression: - A mass was identified in the pancreatic head. This was staged T3 (T4 if SMA invasion) N0 Mx by endosonographic criteria. Fine needle aspiration performed. - There was no evidence of significant pathology in the left lobe of the liver.   12/30/2021 Initial Biopsy   A. PANCREAS, HEAD OF PANCREAS, FINE NEEDLE  ASPIRATION:   FINAL MICROSCOPIC DIAGNOSIS:  - Malignant cells consistent with adenocarcinoma   12/30/2021 Ortiz Staging   Staging form: Exocrine Pancreas, AJCC 8th Edition - Clinical stage from 12/30/2021: Stage IB (cT2, cN0, cM0) - Signed by Shannon Merle, MD on 01/09/2022 Stage prefix: Initial diagnosis Total positive nodes: 0   01/09/2022 Initial Diagnosis   Pancreatic Ortiz [facility])      INTERVAL HISTORY:  Shannon Ortiz is here for a follow up of pancreatic Ortiz. She was last seen by me with PA Murray Hodgkins on 12/26/21. She presents to the clinic accompanied by a lot of family members; a daughter Shannon Ortiz) and son Shannon Ortiz) are in the room. She reports her constipation is getting better.   All other systems were reviewed with the patient and are negative.  MEDICAL HISTORY:  Past Medical  History:  Diagnosis Date   Acquired diverticulum of distal esophagus with dysphagia 10/03/2018   AKI (acute kidney injury) ([place]) 08/31/2018   Anxiety    Bradycardia 02/16/2014   Chest tightness    DDD (degenerative disc disease), lumbar    DJD (degenerative joint disease)    Dry eyes    Esophageal stenosis 08/31/2018   Facial pain    Fibromyalgia    Hemorrhoid    Hypertension    Hypokalemia 08/31/2018   Hypothyroidism    Lightheadedness    Near syncope 02/16/2014   Osteopenia    Sigmoid diverticulosis    Thyroid nodule    PT DENIES    SURGICAL HISTORY: Past Surgical History:  Procedure Laterality Date   ABDOMINAL HYSTERECTOMY     COMPLETE   BALLOON DILATION N/A 09/01/2018   Procedure: BALLOON DILATION;  Surgeon: Shannon Juniper, MD;  Location: [place];  Service: Gastroenterology;  Laterality: N/A;   BIOPSY  09/01/2018   Procedure: BIOPSY;  Surgeon: Shannon Juniper, MD;  Location: [facility] ENDOSCOPY;  Service: Gastroenterology;;   BOTOX INJECTION  09/01/2018   Procedure: BOTOX INJECTION;  Surgeon: Shannon Juniper, MD;  Location: [place];  Service: Gastroenterology;;   BREAST SURGERY     breast biopsy   CHOLECYSTECTOMY     ESOPHAGEAL MANOMETRY  N/A 10/07/2018   Procedure: ESOPHAGEAL MANOMETRY (EM);  Surgeon: Shannon Juniper, MD;  [facility]: [facility] ENDOS[facility];  Service: Gastroenterology;  Laterality: N/A;   ESOPHAGOGASTRODUODENOSCOPY N/A 10/25/2018   Procedure: ESOPHAGOGASTRODUODENOSCOPY (EGD);  Surgeon: Shannon Boston, MD;  Location: [facility] ORS;  Service: General;  Laterality: N/A;   ESOPHAGOGASTRODUODENOSCOPY (EGD) WITH PROPOFOL N/A 09/01/2018   Procedure: ESOPHAGOGASTRODUODENOSCOPY (EGD) WITH PROPOFOL;  Surgeon: Shannon Juniper, MD;  Location: [place];  Service: Gastroenterology;  Laterality: N/A;   ESOPHAGOGASTRODUODENOSCOPY (EGD) WITH PROPOFOL N/A 12/30/2021   Procedure: ESOPHAGOGASTRODUODENOSCOPY (EGD) WITH PROPOFOL;  Surgeon: Arta Silence, MD;  [facility];  Service: Gastroenterology;   Laterality: N/A;   FINE NEEDLE ASPIRATION N/A 12/30/2021   Procedure: FINE NEEDLE ASPIRATION (FNA) LINEAR;  Surgeon: Arta Silence, MD;  [facility];  Service: Gastroenterology;  Laterality: N/A;   PACEMAKER IMPLANT N/A 03/06/2019   Procedure: PACEMAKER IMPLANT;  Surgeon: Shannon Lance, MD;  Location: [place] CV LAB;  Service: Cardiovascular;  Laterality: N/A;   UPPER ESOPHAGEAL ENDOSCOPIC ULTRASOUND (EUS) Bilateral 12/30/2021   Procedure: UPPER ESOPHAGEAL ENDOSCOPIC ULTRASOUND (EUS);  Surgeon: Arta Silence, MD;  Location: [facility];  Service: Gastroenterology;  Laterality: Bilateral;    I have reviewed the social history and family history with the patient and they are unchanged from previous note.  ALLERGIES:  is allergic to ace inhibitors, elavil [amitriptyline hcl], and ketek [telithromycin].  MEDICATIONS:  Current Outpatient Medications  Medication Sig Dispense Refill   amLODipine (NORVASC) 10 MG tablet Take 1 tablet (10 mg total) by mouth daily. 90 tablet 3   apixaban (ELIQUIS) 5 MG TABS tablet Take 1 tablet (5 mg total) by mouth 2 (two) times daily. 60 tablet 10   docusate sodium (COLACE) 100 MG capsule Take 100 mg by mouth 2 (two) times daily.     feeding supplement (ENSURE SURGERY) LIQD Take 237 mLs by mouth 2 (two) times daily between meals. (Patient taking differently: Take 237 mLs by mouth 3 (three) times a week.) 60 Bottle 0   losartan (COZAAR) 100 MG tablet Take 100 mg by mouth in the morning.     metoprolol succinate (TOPROL-XL) 50 MG 24 hr tablet Take 50 mg by mouth in the morning.     ondansetron (ZOFRAN-ODT) 4 MG disintegrating tablet Take 4 mg by mouth every 8 (eight) hours as needed.     pantoprazole (PROTONIX) 20 MG tablet Take 20 mg by mouth daily as needed (acid reflux.).     polyethylene glycol (MIRALAX) 17 g packet Take 17 g by mouth daily. 30 each 3   No current facility-administered medications for this visit.    PHYSICAL  EXAMINATION: ECOG PERFORMANCE STATUS: 2 - Symptomatic, <50% confined to bed  Vitals:   01/09/22 1528  BP: (!) 164/85  Pulse: 87  Resp: 16  Temp: (!) 97.5 F (36.4 C)  SpO2: 97%   Wt Readings from Last 3 Encounters:  01/09/22 129 lb 4 oz (58.6 kg)  12/30/21 129 lb 13.6 oz (58.9 kg)  12/26/21 129 lb 14.4 oz (58.9 kg)     GENERAL:alert, no distress and comfortable SKIN: skin color normal, no rashes or significant lesions EYES: normal, Conjunctiva are pink and non-injected, sclera clear  NEURO: alert & oriented x 3 with fluent speech  LABORATORY DATA:  I have reviewed the data as listed    Latest Ref Rng & Units 01/09/2022    2:53 PM 12/26/2021    1:21 PM 12/15/2021    2:05 PM  CBC  WBC 4.0 - 10.5  K/uL 3.4  5.0  4.0   Hemoglobin 12.0 - 15.0 g/dL 10.2  10.3  11.2   Hematocrit 36.0 - 46.0 % 30.8  31.5  36.4   Platelets 150 - 400 K/uL 253  200  145         Latest Ref Rng & Units 01/09/2022    2:53 PM 12/26/2021    1:21 PM 12/15/2021    2:05 PM  CMP  Glucose 70 - 99 mg/dL 125  113  117   BUN 8 - 23 mg/dL '16  14  14   '$ Creatinine 0.44 - 1.00 mg/dL 0.81  0.79  0.72   Sodium 135 - 145 mmol/L 139  138  139   Potassium 3.5 - 5.1 mmol/L 3.5  3.4  4.0   Chloride 98 - 111 mmol/L 106  104  108   CO2 22 - 32 mmol/L '27  28  22   '$ Calcium 8.9 - 10.3 mg/dL 9.7  10.1  10.1   Total Protein 6.5 - 8.1 g/dL 7.2  7.5  7.5   Total Bilirubin 0.3 - 1.2 mg/dL 0.5  0.5  0.9   Alkaline Phos 38 - 126 U/L 52  61  55   AST 15 - 41 U/L '16  16  25   '$ ALT 0 - 44 U/L '5  5  9       '$ RADIOGRAPHIC STUDIES: I have personally reviewed the radiological images as listed and agreed with the findings in the report. No results found.    No orders of the defined types were placed in this encounter.  All questions were answered. The patient knows to call the clinic with any problems, questions or concerns. No barriers to learning was detected. The total time spent in the appointment was 55 minutes.     Shannon Merle, MD 01/09/2022   I, Wilburn Mylar, am acting as scribe for Shannon Merle, MD.   I have reviewed the above documentation for accuracy and completeness, and I agree with the above.

## 2022-01-10 ENCOUNTER — Other Ambulatory Visit (HOSPITAL_COMMUNITY): Payer: Medicare Other

## 2022-01-13 ENCOUNTER — Telehealth: Payer: Self-pay | Admitting: Hematology

## 2022-01-13 NOTE — Telephone Encounter (Signed)
Scheduled follow-up appointment per 7/28 los. Patient's daughter is aware.

## 2022-01-14 ENCOUNTER — Other Ambulatory Visit: Payer: Self-pay

## 2022-01-14 ENCOUNTER — Inpatient Hospital Stay: Payer: Medicare Other | Attending: Physician Assistant | Admitting: Hematology

## 2022-01-14 VITALS — BP 143/80 | HR 60 | Temp 98.0°F | Resp 14 | Wt 129.9 lb

## 2022-01-14 DIAGNOSIS — C250 Malignant neoplasm of head of pancreas: Secondary | ICD-10-CM | POA: Insufficient documentation

## 2022-01-14 DIAGNOSIS — M8580 Other specified disorders of bone density and structure, unspecified site: Secondary | ICD-10-CM | POA: Insufficient documentation

## 2022-01-14 DIAGNOSIS — D649 Anemia, unspecified: Secondary | ICD-10-CM | POA: Diagnosis not present

## 2022-01-14 DIAGNOSIS — I119 Hypertensive heart disease without heart failure: Secondary | ICD-10-CM | POA: Diagnosis not present

## 2022-01-14 DIAGNOSIS — Z9049 Acquired absence of other specified parts of digestive tract: Secondary | ICD-10-CM | POA: Insufficient documentation

## 2022-01-14 DIAGNOSIS — R140 Abdominal distension (gaseous): Secondary | ICD-10-CM | POA: Diagnosis not present

## 2022-01-14 DIAGNOSIS — Z8719 Personal history of other diseases of the digestive system: Secondary | ICD-10-CM | POA: Diagnosis not present

## 2022-01-14 DIAGNOSIS — K5900 Constipation, unspecified: Secondary | ICD-10-CM | POA: Insufficient documentation

## 2022-01-14 DIAGNOSIS — Z79899 Other long term (current) drug therapy: Secondary | ICD-10-CM | POA: Diagnosis not present

## 2022-01-14 DIAGNOSIS — K8689 Other specified diseases of pancreas: Secondary | ICD-10-CM | POA: Diagnosis not present

## 2022-01-14 DIAGNOSIS — I700 Atherosclerosis of aorta: Secondary | ICD-10-CM | POA: Insufficient documentation

## 2022-01-14 DIAGNOSIS — Z7901 Long term (current) use of anticoagulants: Secondary | ICD-10-CM | POA: Insufficient documentation

## 2022-01-14 NOTE — Progress Notes (Signed)
[place]   Telephone:(336) [phone] Fax:(336) [phone]   Clinic Follow up Note   Patient Care Team: Lawerance Cruel, MD as PCP - General (Family Medicine) Josue Hector, MD as PCP - Cardiology (Cardiology) Evans Lance, MD as PCP - Electrophysiology (Cardiology) Michael Boston, MD as Consulting Physician (General Surgery) Ronnette Juniper, MD as Consulting Physician (Gastroenterology) Cordelia Poche as Physician Assistant (Hematology and Oncology) Truitt Merle, MD as Consulting Physician (Oncology) Royston Bake, RN as Oncology Nurse Navigator (Oncology)  Date of Service:  01/14/2022  CHIEF COMPLAINT: f/u of pancreatic cancer  CURRENT THERAPY:  Pending   ASSESSMENT & PLAN:  Shannon Ortiz is a 86 y.o. female with   1. Pancreatic Cancer, stage IB, T3/4 N0 by EUS, unresectable -presented to ED 12/15/21 with constipation, decreased appetite (with weight loss of 10-14 lbs in 4 months), and weakness. CT AP showed pancreatic and biliary ductal dilation and soft tissue at pancreatic head. -EUS with biopsy on 12/30/21 by Dr. Paulita Fujita, showed 3 cm pancreatic head mass, staged T3 (T4 if SMA invasion) N0. Cytology confirmed adenocarcinoma. -staging chest CT was recommended, but they declined because she is not sure if she wants cancer treatment  -Her case was reviewed in our GI conference.  Due to vascular invasion by the tumor, and her advanced age, she is not a candidate for surgical resection.  I informed patient today. -I discussed other cancer treatment, including palliative single agent chemotherapy gemcitabine, and or radiation (including external beam radiation or SBRT).  I explained the potential benefit and side effect of chemo and radiation to patient and her family in detail today, and explained to her that none of the chemotherapy or radiation are curative.  Given her advanced age and preference of quality of life, I recommend palliative radiation alone, and I hope  she will be a candidate for SBRT.  -After lengthy discussion, patient decided to proceed with radiation, and declined chemotherapy.  Her family member supported her decision. -We also discussed palliative care and hospice down the road, when she develops more symptoms from cancer.  She seemed to be open to this. -I will follow her back in 5-6 weeks   2. Goal of care discussion  -We discussed the nature of her cancer in the setting of her advanced age, especially if she does not have good response to or progress on chemotherapy. We reviewed the balance between quality of life and prolonging her life.   3. Symptom Management: Constipation, Gas, Anemia, Low appetite -secondary to #1 -she endorses using stool softener twice daily. -I advised her to try Gas-X for her excess gas.  -she does not need pain meds for now  -I encouraged her to drink nutritional supplements such as Ensure -Dietitian referral   4. Genetics -we drew genetic panel 01/09/22     PLAN: -referral to rad/onc Dr. Lisbeth Renshaw  -Nutrition consult  -lab and f/u in 5-6 weeks    No problem-specific Assessment & Plan notes found for this encounter.   SUMMARY OF ONCOLOGIC HISTORY: Oncology History Overview Note   Cancer Staging  Pancreatic cancer [facility]) Staging form: Exocrine Pancreas, AJCC 8th Edition - Clinical stage from 12/30/2021: Stage IB (cT2, cN0, cM0) - Signed by Truitt Merle, MD on 01/09/2022    Pancreatic cancer ([place])  12/15/2021 Imaging   CLINICAL DATA:  Abdominal pain   EXAM: CT ABDOMEN AND PELVIS WITH CONTRAST  IMPRESSION: 1. Marked main pancreatic ductal dilation and mild biliary ductal dilation  with ill-defined soft tissue at the pancreatic head, findings are concerning for pancreatic malignancy. Recommend further evaluation with EUS/MRCP. 2. Nonspecific small solid nodule of the left lower lobe. Recommend attention on follow-up. 3. Cardiomegaly and aortic Atherosclerosis (ICD10-I70.0).   12/30/2021 Procedure    Upper EUS, Dr. Paulita Fujita  Impression: - A mass was identified in the pancreatic head. This was staged T3 (T4 if SMA invasion) N0 Mx by endosonographic criteria. Fine needle aspiration performed. - There was no evidence of significant pathology in the left lobe of the liver.   12/30/2021 Initial Biopsy   A. PANCREAS, HEAD OF PANCREAS, FINE NEEDLE  ASPIRATION:   FINAL MICROSCOPIC DIAGNOSIS:  - Malignant cells consistent with adenocarcinoma   12/30/2021 Cancer Staging   Staging form: Exocrine Pancreas, AJCC 8th Edition - Clinical stage from 12/30/2021: Stage IB (cT2, cN0, cM0) - Signed by Truitt Merle, MD on 01/09/2022 Stage prefix: Initial diagnosis Total positive nodes: 0   01/09/2022 Initial Diagnosis   Pancreatic cancer [facility])      INTERVAL HISTORY:  Shannon Ortiz is here for a follow up of pancreatic cancer to further discuss her treatment options. She was last seen by me on 01/09/22. She presents to the clinic accompanied by 5 of her family members. She is clinically stable, no new symptoms since last week.  She still has abdominal gassy and bloating feeling, with low appetite, she is able to function well at home.  Weight is stable.   All other systems were reviewed with the patient and are negative.  MEDICAL HISTORY:  Past Medical History:  Diagnosis Date   Acquired diverticulum of distal esophagus with dysphagia 10/03/2018   AKI (acute kidney injury) ([place]) 08/31/2018   Anxiety    Bradycardia 02/16/2014   Chest tightness    DDD (degenerative disc disease), lumbar    DJD (degenerative joint disease)    Dry eyes    Esophageal stenosis 08/31/2018   Facial pain    Fibromyalgia    Hemorrhoid    Hypertension    Hypokalemia 08/31/2018   Hypothyroidism    Lightheadedness    Near syncope 02/16/2014   Osteopenia    Sigmoid diverticulosis    Thyroid nodule    PT DENIES    SURGICAL HISTORY: Past Surgical History:  Procedure Laterality Date   ABDOMINAL HYSTERECTOMY     COMPLETE    BALLOON DILATION N/A 09/01/2018   Procedure: BALLOON DILATION;  Surgeon: Ronnette Juniper, MD;  Location: [place];  Service: Gastroenterology;  Laterality: N/A;   BIOPSY  09/01/2018   Procedure: BIOPSY;  Surgeon: Ronnette Juniper, MD;  Location: [facility] ENDOSCOPY;  Service: Gastroenterology;;   BOTOX INJECTION  09/01/2018   Procedure: BOTOX INJECTION;  Surgeon: Ronnette Juniper, MD;  Location: [facility] ENDOSCOPY;  Service: Gastroenterology;;   BREAST SURGERY     breast biopsy   CHOLECYSTECTOMY     ESOPHAGEAL MANOMETRY N/A 10/07/2018   Procedure: ESOPHAGEAL MANOMETRY (EM);  Surgeon: Ronnette Juniper, MD;  [facility];  Service: Gastroenterology;  Laterality: N/A;   ESOPHAGOGASTRODUODENOSCOPY N/A 10/25/2018   Procedure: ESOPHAGOGASTRODUODENOSCOPY (EGD);  Surgeon: Michael Boston, MD;  [facility];  Service: General;  Laterality: N/A;   ESOPHAGOGASTRODUODENOSCOPY (EGD) WITH PROPOFOL N/A 09/01/2018   Procedure: ESOPHAGOGASTRODUODENOSCOPY (EGD) WITH PROPOFOL;  Surgeon: Ronnette Juniper, MD;  Location: [place];  Service: Gastroenterology;  Laterality: N/A;   ESOPHAGOGASTRODUODENOSCOPY (EGD) WITH PROPOFOL N/A 12/30/2021   Procedure: ESOPHAGOGASTRODUODENOSCOPY (EGD) WITH PROPOFOL;  Surgeon: Arta Silence, MD;  [facility];  Service: Gastroenterology;  Laterality:  N/A;   FINE NEEDLE ASPIRATION N/A 12/30/2021   Procedure: FINE NEEDLE ASPIRATION (FNA) LINEAR;  Surgeon: Arta Silence, MD;  [facility];  Service: Gastroenterology;  Laterality: N/A;   PACEMAKER IMPLANT N/A 03/06/2019   Procedure: PACEMAKER IMPLANT;  Surgeon: Evans Lance, MD;  Location: [place] CV LAB;  Service: Cardiovascular;  Laterality: N/A;   UPPER ESOPHAGEAL ENDOSCOPIC ULTRASOUND (EUS) Bilateral 12/30/2021   Procedure: UPPER ESOPHAGEAL ENDOSCOPIC ULTRASOUND (EUS);  Surgeon: Arta Silence, MD;  Location: [facility];  Service: Gastroenterology;  Laterality: Bilateral;    I have reviewed the social history and family  history with the patient and they are unchanged from previous note.  ALLERGIES:  is allergic to ace inhibitors, elavil [amitriptyline hcl], and ketek [telithromycin].  MEDICATIONS:  Current Outpatient Medications  Medication Sig Dispense Refill   amLODipine (NORVASC) 10 MG tablet Take 1 tablet (10 mg total) by mouth daily. 90 tablet 3   apixaban (ELIQUIS) 5 MG TABS tablet Take 1 tablet (5 mg total) by mouth 2 (two) times daily. 60 tablet 10   docusate sodium (COLACE) 100 MG capsule Take 100 mg by mouth 2 (two) times daily.     feeding supplement (ENSURE SURGERY) LIQD Take 237 mLs by mouth 2 (two) times daily between meals. (Patient taking differently: Take 237 mLs by mouth 3 (three) times a week.) 60 Bottle 0   losartan (COZAAR) 100 MG tablet Take 100 mg by mouth in the morning.     metoprolol succinate (TOPROL-XL) 50 MG 24 hr tablet Take 50 mg by mouth in the morning.     ondansetron (ZOFRAN-ODT) 4 MG disintegrating tablet Take 4 mg by mouth every 8 (eight) hours as needed.     pantoprazole (PROTONIX) 20 MG tablet Take 20 mg by mouth daily as needed (acid reflux.).     polyethylene glycol (MIRALAX) 17 g packet Take 17 g by mouth daily. 30 each 3   No current facility-administered medications for this visit.    PHYSICAL EXAMINATION: ECOG PERFORMANCE STATUS: 2 - Symptomatic, <50% confined to bed  Vitals:   01/14/22 1613  BP: (!) 143/80  Pulse: 60  Resp: 14  Temp: 98 F (36.7 C)  SpO2: 100%   Wt Readings from Last 3 Encounters:  01/14/22 129 lb 14.4 oz (58.9 kg)  01/09/22 129 lb 4 oz (58.6 kg)  12/30/21 129 lb 13.6 oz (58.9 kg)     GENERAL:alert, no distress and comfortable SKIN: skin color, texture, turgor are normal, no rashes or significant lesions EYES: normal, Conjunctiva are pink and non-injected, sclera clear Musculoskeletal:no cyanosis of digits and no clubbing  NEURO: alert & oriented x 3 with fluent speech, no focal motor/sensory deficits  LABORATORY DATA:  I have  reviewed the data as listed    Latest Ref Rng & Units 01/09/2022    2:53 PM 12/26/2021    1:21 PM 12/15/2021    2:05 PM  CBC  WBC 4.0 - 10.5 K/uL 3.4  5.0  4.0   Hemoglobin 12.0 - 15.0 g/dL 10.2  10.3  11.2   Hematocrit 36.0 - 46.0 % 30.8  31.5  36.4   Platelets 150 - 400 K/uL 253  200  145         Latest Ref Rng & Units 01/09/2022    2:53 PM 12/26/2021    1:21 PM 12/15/2021    2:05 PM  CMP  Glucose 70 - 99 mg/dL 125  113  117   BUN 8 - 23 mg/dL 16  14  14   Creatinine 0.44 - 1.00 mg/dL 0.81  0.79  0.72   Sodium 135 - 145 mmol/L 139  138  139   Potassium 3.5 - 5.1 mmol/L 3.5  3.4  4.0   Chloride 98 - 111 mmol/L 106  104  108   CO2 22 - 32 mmol/L '27  28  22   '$ Calcium 8.9 - 10.3 mg/dL 9.7  10.1  10.1   Total Protein 6.5 - 8.1 g/dL 7.2  7.5  7.5   Total Bilirubin 0.3 - 1.2 mg/dL 0.5  0.5  0.9   Alkaline Phos 38 - 126 U/L 52  61  55   AST 15 - 41 U/L '16  16  25   '$ ALT 0 - 44 U/L '5  5  9       '$ RADIOGRAPHIC STUDIES: I have personally reviewed the radiological images as listed and agreed with the findings in the report. No results found.    Orders Placed This Encounter  Procedures   Ambulatory Referral to [facility] Nutrition    Referral Priority:   Routine    Referral Type:   Consultation    Referral Reason:   Specialty Services Required    Number of Visits Requested:   1   Ambulatory referral to Radiation Oncology    Referral Priority:   Urgent    Referral Type:   Consultation    Referral Reason:   Specialty Services Required    Requested Specialty:   Radiation Oncology    Number of Visits Requested:   1   All questions were answered. The patient knows to call the clinic with any problems, questions or concerns. No barriers to learning was detected. The total time spent in the appointment was 40 minutes.     Truitt Merle, MD 01/14/2022   I, Wilburn Mylar, am acting as scribe for Truitt Merle, MD.   I have reviewed the above documentation for accuracy and completeness, and I  agree with the above.

## 2022-01-15 ENCOUNTER — Other Ambulatory Visit: Payer: Self-pay

## 2022-01-15 ENCOUNTER — Telehealth: Payer: Self-pay | Admitting: Radiation Oncology

## 2022-01-15 DIAGNOSIS — C250 Malignant neoplasm of head of pancreas: Secondary | ICD-10-CM

## 2022-01-15 NOTE — Telephone Encounter (Signed)
Called patient to schedule a consultation w. Dr. Moody. No answer, LVM for a return call.  

## 2022-01-19 NOTE — Progress Notes (Signed)
GI Location of Tumor / Histology: Pancreatic Cancer-  Shannon Ortiz presented to the ER with weakness, loss of appetite, constipation, and weight loss.   EUS 12/30/2021: 3 cm pancreatic head mass, staged T3 (T4 if SMA invasion) N0.  CT AP: Pancreatic and biliary ductal dilation and soft tissue at the pancreatic head.  Biopsies of Pancreas Head 12/30/2021    Past/Anticipated interventions by surgeon, if any:  -Not a surgical candidate  Past/Anticipated interventions by medical oncology, if any:  Dr. Burr Medico 01/14/2022 -staging chest CT was recommended, but they declined because she is not sure if she wants cancer treatment  -Her case was reviewed in our GI conference.  Due to vascular invasion by the tumor, and her advanced age, she is not a candidate for surgical resection. -I discussed other cancer treatment, including palliative single agent chemotherapy gemcitabine, and or radiation (including external beam radiation or SBRT). -Given her advanced age and preference of quality of life, I recommend palliative radiation alone, and I hope she will be a candidate for SBRT.  -After lengthy discussion, patient decided to proceed with radiation, and declined chemotherapy. -We also discussed palliative care and hospice down the road, when she develops more symptoms from cancer.  She seemed to be open to this.   Weight changes, if any: Has lost about 17 pounds in the last month.  Bowel/Bladder complaints, if any: Notes some constipation, taking stool softeners BID.  Denies changes with her Bladder.  Nausea / Vomiting, if any: Reports some nausea, has medication on hand if she needs it.  Denies vomiting.  Pain issues, if any: No   Appetite:  Reports her appetite is doing better.    SAFETY ISSUES: Prior radiation? No Pacemaker/ICD? Pacer, managed by Dr. Lovena Le Possible current pregnancy? Hysterectomy Is the patient on methotrexate? No  Current Complaints/Details:

## 2022-01-19 NOTE — Progress Notes (Signed)
Radiation Oncology         [phone] ________________________________  Name: Shannon Ortiz        MRN: [id number]  Date of Service: 01/20/2022 DOB: [birth year]  NU:UVOZ, Dwyane Luo, MD  Truitt Merle, MD     REFERRING PHYSICIAN: Truitt Merle, MD   DIAGNOSIS: The encounter diagnosis was Malignant neoplasm of head of pancreas [facility]).   HISTORY OF PRESENT ILLNESS: Shannon Ortiz is a 86 y.o. female seen at the request of Dr. Burr Medico for a diagnosis of pancreatic cancer.  The patient was originally diagnosed with her cancer in July 2023 after presenting to the emergency department with weakness loss of appetite constipation and weight loss.  Imaging at that time showed pancreatic and biliary ductal dilatation and an EUS with Dr. Paulita Fujita with biopsy showed between T3-T4 disease no nodal involvement was seen but the mass measured 3 cm in the head of the pancreas and cytology confirmed adenocarcinoma.  She was discussed in GI oncology conference, there was concern that there was vascular invasion and she was not considered a surgical candidate.  She declined additional staging and chemotherapy and rather would consider treatments that help preserve quality of life.  She is seen to discuss possible stereotactic body radiotherapy (SBRT).    PREVIOUS RADIATION THERAPY: {EXAM; YES/NO:19492::"No"}   PAST MEDICAL HISTORY:  Past Medical History:  Diagnosis Date   Acquired diverticulum of distal esophagus with dysphagia 10/03/2018   AKI (acute kidney injury) ([place]) 08/31/2018   Anxiety    Bradycardia 02/16/2014   Chest tightness    DDD (degenerative disc disease), lumbar    DJD (degenerative joint disease)    Dry eyes    Esophageal stenosis 08/31/2018   Facial pain    Fibromyalgia    Hemorrhoid    Hypertension    Hypokalemia 08/31/2018   Hypothyroidism    Lightheadedness    Near syncope 02/16/2014   Osteopenia    Sigmoid diverticulosis    Thyroid nodule    PT DENIES       PAST SURGICAL HISTORY: Past  Surgical History:  Procedure Laterality Date   ABDOMINAL HYSTERECTOMY     COMPLETE   BALLOON DILATION N/A 09/01/2018   Procedure: BALLOON DILATION;  Surgeon: Ronnette Juniper, MD;  Location: [place];  Service: Gastroenterology;  Laterality: N/A;   BIOPSY  09/01/2018   Procedure: BIOPSY;  Surgeon: Ronnette Juniper, MD;  Location: [facility] ENDOSCOPY;  Service: Gastroenterology;;   BOTOX INJECTION  09/01/2018   Procedure: BOTOX INJECTION;  Surgeon: Ronnette Juniper, MD;  Location: [facility] ENDOSCOPY;  Service: Gastroenterology;;   BREAST SURGERY     breast biopsy   CHOLECYSTECTOMY     ESOPHAGEAL MANOMETRY N/A 10/07/2018   Procedure: ESOPHAGEAL MANOMETRY (EM);  Surgeon: Ronnette Juniper, MD;  Location: [facility] ENDOSCOPY;  Service: Gastroenterology;  Laterality: N/A;   ESOPHAGOGASTRODUODENOSCOPY N/A 10/25/2018   Procedure: ESOPHAGOGASTRODUODENOSCOPY (EGD);  Surgeon: Michael Boston, MD;  Location: [facility];  Service: General;  Laterality: N/A;   ESOPHAGOGASTRODUODENOSCOPY (EGD) WITH PROPOFOL N/A 09/01/2018   Procedure: ESOPHAGOGASTRODUODENOSCOPY (EGD) WITH PROPOFOL;  Surgeon: Ronnette Juniper, MD;  Location: [place];  Service: Gastroenterology;  Laterality: N/A;   ESOPHAGOGASTRODUODENOSCOPY (EGD) WITH PROPOFOL N/A 12/30/2021   Procedure: ESOPHAGOGASTRODUODENOSCOPY (EGD) WITH PROPOFOL;  Surgeon: Arta Silence, MD;  Location: [facility] ENDOSCOPY;  Service: Gastroenterology;  Laterality: N/A;   FINE NEEDLE ASPIRATION N/A 12/30/2021   Procedure: FINE NEEDLE ASPIRATION (FNA) LINEAR;  Surgeon: Arta Silence, MD;  Location: [facility] ENDOSCOPY;  Service: Gastroenterology;  Laterality: N/A;  PACEMAKER IMPLANT N/A 03/06/2019   Procedure: PACEMAKER IMPLANT;  Surgeon: Evans Lance, MD;  Location: [place] CV LAB;  Service: Cardiovascular;  Laterality: N/A;   UPPER ESOPHAGEAL ENDOSCOPIC ULTRASOUND (EUS) Bilateral 12/30/2021   Procedure: UPPER ESOPHAGEAL ENDOSCOPIC ULTRASOUND (EUS);  Surgeon: Arta Silence, MD;  Location: [facility];  Service:  Gastroenterology;  Laterality: Bilateral;     FAMILY HISTORY:  Family History  Problem Relation Age of Onset   Hypertension Mother    Diabetes Mother        non-insulin dependant   CAD Brother    Cancer Maternal Aunt    Cancer Maternal Uncle      SOCIAL HISTORY:  reports that she has never smoked. She has never used smokeless tobacco. She reports that she does not drink alcohol and does not use drugs.   ALLERGIES: Ace inhibitors, Elavil [amitriptyline hcl], and Ketek [telithromycin]   MEDICATIONS:  Current Outpatient Medications  Medication Sig Dispense Refill   amLODipine (NORVASC) 10 MG tablet Take 1 tablet (10 mg total) by mouth daily. 90 tablet 3   apixaban (ELIQUIS) 5 MG TABS tablet Take 1 tablet (5 mg total) by mouth 2 (two) times daily. 60 tablet 10   docusate sodium (COLACE) 100 MG capsule Take 100 mg by mouth 2 (two) times daily.     feeding supplement (ENSURE SURGERY) LIQD Take 237 mLs by mouth 2 (two) times daily between meals. (Patient taking differently: Take 237 mLs by mouth 3 (three) times a week.) 60 Bottle 0   losartan (COZAAR) 100 MG tablet Take 100 mg by mouth in the morning.     metoprolol succinate (TOPROL-XL) 50 MG 24 hr tablet Take 50 mg by mouth in the morning.     ondansetron (ZOFRAN-ODT) 4 MG disintegrating tablet Take 4 mg by mouth every 8 (eight) hours as needed.     pantoprazole (PROTONIX) 20 MG tablet Take 20 mg by mouth daily as needed (acid reflux.).     polyethylene glycol (MIRALAX) 17 g packet Take 17 g by mouth daily. 30 each 3   No current facility-administered medications for this visit.     REVIEW OF SYSTEMS: On review of systems, the patient reports that *** is doing well overall. *** denies any chest pain, shortness of breath, cough, fevers, chills, night sweats, unintended weight changes. *** denies any bowel or bladder disturbances, and denies abdominal pain, nausea or vomiting. *** denies any new musculoskeletal or joint aches or  pains. A complete review of systems is obtained and is otherwise negative.     PHYSICAL EXAM:  Wt Readings from Last 3 Encounters:  01/14/22 129 lb 14.4 oz (58.9 kg)  01/09/22 129 lb 4 oz (58.6 kg)  12/30/21 129 lb 13.6 oz (58.9 kg)   Temp Readings from Last 3 Encounters:  01/14/22 98 F (36.7 C) (Oral)  01/09/22 (!) 97.5 F (36.4 C) (Oral)  12/30/21 (!) 97.4 F (36.3 C) (Temporal)   BP Readings from Last 3 Encounters:  01/14/22 (!) 143/80  01/09/22 (!) 164/85  12/30/21 (!) 110/54   Pulse Readings from Last 3 Encounters:  01/14/22 60  01/09/22 87  12/30/21 62    /10  In general this is a well appearing *** in no acute distress. ***'s alert and oriented x4 and appropriate throughout the examination. Cardiopulmonary assessment is negative for acute distress and *** exhibits normal effort.     ECOG = ***  0 - Asymptomatic (Fully active, able to carry on all predisease activities without  restriction)  1 - Symptomatic but completely ambulatory (Restricted in physically strenuous activity but ambulatory and able to carry out work of a light or sedentary nature. For example, light housework, office work)  2 - Symptomatic, <50% in bed during the day (Ambulatory and capable of all self care but unable to carry out any work activities. Up and about more than 50% of waking hours)  3 - Symptomatic, >50% in bed, but not bedbound (Capable of only limited self-care, confined to bed or chair 50% or more of waking hours)  4 - Bedbound (Completely disabled. Cannot carry on any self-care. Totally confined to bed or chair)  5 - Death   Eustace Pen MM, Creech RH, Tormey DC, et al. [phone]). "Toxicity and response criteria of the [facility]". [place] Oncol. 5 (6): 649-55    LABORATORY DATA:  Lab Results  Component Value Date   WBC 3.4 (L) 01/09/2022   HGB 10.2 (L) 01/09/2022   HCT 30.8 (L) 01/09/2022   MCV 71.1 (L) 01/09/2022   PLT 253 01/09/2022   Lab  Results  Component Value Date   NA 139 01/09/2022   K 3.5 01/09/2022   CL 106 01/09/2022   CO2 27 01/09/2022   Lab Results  Component Value Date   ALT 5 01/09/2022   AST 16 01/09/2022   ALKPHOS 52 01/09/2022   BILITOT 0.5 01/09/2022      RADIOGRAPHY: No results found.     IMPRESSION/PLAN: 1. Unresectable, Stage IA, [id number], adenocarcinoma of the head of the pancreas. Dr. Lisbeth Renshaw discusses the pathology findings and reviews the nature of unresectable pancreatic disease.  While this limits her ability to have surgery, he would offer her a course of stereotactic body radiotherapy (SBRT).  We discussed the need for fiducial marker placement in order to achieve such a procedure.  We also discussed the limitations of not having complete staging imaging.  The patient is interested in proceeding with radiotherapy to try and halt tumor growth and delay progression.  She understands that the dosing is definitive however overall this would not be a curative therapy alone.  We discussed the risks, benefits, short, and long term effects of radiotherapy, as well as the intent, and the patient is interested in proceeding. Dr. Lisbeth Renshaw discusses the delivery and logistics of radiotherapy and anticipates a course of 5 fractions of radiotherapy. Written consent is obtained and placed in the chart, a copy was provided to the patient.  She will come in for simulation with IV start once she has undergone fiducial marker placement.***  In a visit lasting *** minutes, greater than 50% of the time was spent face to face discussing the patient's condition, in preparation for the discussion, and coordinating the patient's care.   The above documentation reflects my direct findings during this shared patient visit. Please see the separate note by Dr. Lisbeth Renshaw on this date for the remainder of the patient's plan of care.    Carola Rhine, [facility]   **Disclaimer: This note was dictated with voice recognition software.  Similar sounding words can inadvertently be transcribed and this note may contain transcription errors which may not have been corrected upon publication of note.**

## 2022-01-20 ENCOUNTER — Telehealth: Payer: Self-pay

## 2022-01-20 ENCOUNTER — Encounter: Payer: Self-pay | Admitting: Radiation Oncology

## 2022-01-20 ENCOUNTER — Other Ambulatory Visit: Payer: Self-pay

## 2022-01-20 ENCOUNTER — Telehealth: Payer: Self-pay | Admitting: Hematology

## 2022-01-20 ENCOUNTER — Other Ambulatory Visit (HOSPITAL_COMMUNITY): Payer: Self-pay

## 2022-01-20 ENCOUNTER — Encounter: Payer: Self-pay | Admitting: Internal Medicine

## 2022-01-20 ENCOUNTER — Ambulatory Visit
Admission: RE | Admit: 2022-01-20 | Discharge: 2022-01-20 | Disposition: A | Discharge status: Home / Self Care | Payer: Medicare Other | Source: Ambulatory Visit | Attending: Radiation Oncology | Admitting: Radiation Oncology

## 2022-01-20 VITALS — BP 147/74 | HR 66 | Temp 98.2°F | Resp 18 | Ht 68.0 in | Wt 127.1 lb

## 2022-01-20 DIAGNOSIS — C250 Malignant neoplasm of head of pancreas: Secondary | ICD-10-CM | POA: Diagnosis not present

## 2022-01-20 DIAGNOSIS — E039 Hypothyroidism, unspecified: Secondary | ICD-10-CM | POA: Insufficient documentation

## 2022-01-20 DIAGNOSIS — Z79899 Other long term (current) drug therapy: Secondary | ICD-10-CM | POA: Diagnosis not present

## 2022-01-20 DIAGNOSIS — R630 Anorexia: Secondary | ICD-10-CM | POA: Diagnosis not present

## 2022-01-20 DIAGNOSIS — M8580 Other specified disorders of bone density and structure, unspecified site: Secondary | ICD-10-CM | POA: Insufficient documentation

## 2022-01-20 DIAGNOSIS — K5900 Constipation, unspecified: Secondary | ICD-10-CM | POA: Insufficient documentation

## 2022-01-20 DIAGNOSIS — I10 Essential (primary) hypertension: Secondary | ICD-10-CM | POA: Insufficient documentation

## 2022-01-20 DIAGNOSIS — Z809 Family history of malignant neoplasm, unspecified: Secondary | ICD-10-CM | POA: Diagnosis not present

## 2022-01-20 DIAGNOSIS — M1990 Unspecified osteoarthritis, unspecified site: Secondary | ICD-10-CM | POA: Insufficient documentation

## 2022-01-20 DIAGNOSIS — M797 Fibromyalgia: Secondary | ICD-10-CM | POA: Insufficient documentation

## 2022-01-20 DIAGNOSIS — Z7901 Long term (current) use of anticoagulants: Secondary | ICD-10-CM | POA: Diagnosis not present

## 2022-01-20 DIAGNOSIS — C259 Malignant neoplasm of pancreas, unspecified: Secondary | ICD-10-CM

## 2022-01-20 MED ORDER — NYSTATIN 100000 UNIT/ML MT SUSP
5.0000 mL | Freq: Four times a day (QID) | OROMUCOSAL | 1 refills | Status: DC | PRN
  Filled 2022-01-20: qty 140, 7d supply, fill #0

## 2022-01-20 NOTE — Telephone Encounter (Signed)
Scheduled per 8/8 in basket, pt daughter has been called and confirmed appt

## 2022-01-20 NOTE — Progress Notes (Signed)
Dr. Burr Medico sent staff message on 01/20/2022 stating she referenced incorrect patient for magic mouthwash prescription.  Contacted [facility] to discontinue prescription for Magic Mouthwash for this patient.

## 2022-01-20 NOTE — Telephone Encounter (Signed)
EUS Fiducial marker scheduled for 8/14 at 1145 am at [facility]   Per office protocol, patient can hold Eliquis for 1-2 days prior to procedure.  Patient was told to go ahead and hold her Eliquis on Sunday since her procedure is on Tuesday.  Left message on machine to call back

## 2022-01-20 NOTE — Progress Notes (Signed)
TO BE COMPLETED BY RADIATION ONCOLOGIST OFFICE:   Patient Name: Shannon Ortiz   Date of Birth: 13-Jan-1936   Radiation Oncologist: Dr. Kyung Rudd   Site to be Treated: Pancreas   Will x-rays >10 MV be used? No   Will the radiation be >10 cm from the device? Yes   Planned Treatment Start Date: 01/26/2022    Planned Treatment Start Date:   TO BE COMPLETED BY CARDIOLOGIST OFFICE:   Device Information:  Pacemaker '[x]'$      ICD '[]'$    Brand: [employer]: [phone] Model #: [id number]  Serial Number: [id number]     Date of Placement: 03/06/2019  Site of Placement: Left Chest  Remote Device Check--Frequency: 91 days   Last Check: 11/09/2021  Is the Patient Pacer Dependent?:  Yes '[]'$   No '[x]'$   Does cardiologist request Radiation Oncology to schedule device testing by vendor for the following:  Prior to the Initiation of Treatments?  Yes '[]'$  No '[x]'$  During Treatments?  Yes '[]'$  No '[x]'$  Post Radiation Treatments?  Yes '[]'$  No '[x]'$   Is device monitoring necessary by vendor/cardiologist team during treatments?  Yes '[]'$   No '[x]'$   Is cardiac monitoring by Radiation Oncology nursing necessary during treatments? Yes '[x]'$   No '[]'$   Do you recommend device be relocated prior to Radiation Treatment? Yes '[]'$   No '[x]'$   **PLEASE LIST ANY NOTES OR SPECIAL REQUESTS:       CARDIOLOGIST SIGNATURE:  Dr. Cristopher Peru Per [facility] Standing Orders, Wanda Plump  01/20/2022 4:40 PM  **Please route completed form back to Radiation Oncology Nursing and "[place]", OR send an update if there will be a delay in having form completed by expected start date.  **Call [phone] if you have any questions or do not get an in-basket response from a Radiation Oncology staff member

## 2022-01-20 NOTE — Telephone Encounter (Signed)
-----   Message from Irving Copas., MD sent at 01/20/2022 12:50 PM EDT ----- Chong Sicilian, With the ERCP cancellation that we have just had, for next week, can you please get this patient in for EUS with fiducial placement? Thanks. GM ----- Message ----- From: Truitt Merle, MD Sent: 01/19/2022   7:04 AM EDT To: Milus Banister, MD; #  Thanks Bryson Ha.  Gabe, are you available for fiducial placement in next few week? I think Linna Hoff is probable still out of office.  Thanks   Krista Blue  ----- Message ----- From: Hayden Pedro, PA-C Sent: 01/19/2022   6:56 AM EDT To: Truitt Merle, MD  Dr. Paulita Fujita does not do fiducials. If Dr. Ardis Hughs or Mansouraty could do that it would help a lot! ----- Message ----- From: Truitt Merle, MD Sent: 01/14/2022  10:44 PM EDT To: Kyung Rudd, MD; Hayden Pedro, PA-C; #  Hi John,  This 86 yo lady has newly diagnosed pancreatic cancer, unresectable. She has agreed with palliative RT and declined chemo, which is very reasonable. Hope she will be a candidate for SBRT.   Santiago Glad, could you call rad/onc to see if they can schedule her to see Dr. Lisbeth Renshaw next week?   Dr. Paulita Fujita, do you place fiducial? If not, I will reach out to Dr. Rush Landmark, or let me know if any one else can do it.   Thanks   Krista Blue

## 2022-01-20 NOTE — Progress Notes (Signed)
Per verbal order from Dr. Burr Medico, Magic Mourthwash 1:1 equal parts of Diphenhydramine, Maalox, and Nystatin Swish and Swallow 34m PO Q4hrs.  Prescription sent to [facility] for pt to pickup when ready.

## 2022-01-21 DIAGNOSIS — C250 Malignant neoplasm of head of pancreas: Secondary | ICD-10-CM | POA: Diagnosis not present

## 2022-01-21 NOTE — Telephone Encounter (Signed)
EUS scheduled, pt and daughter instructed and medications reviewed.  Patient instructions mailed to home.  Patient to call with any questions or concerns.

## 2022-01-23 ENCOUNTER — Encounter (HOSPITAL_COMMUNITY): Payer: Self-pay | Admitting: Gastroenterology

## 2022-01-26 ENCOUNTER — Inpatient Hospital Stay: Payer: Medicare Other | Admitting: Nutrition

## 2022-01-26 ENCOUNTER — Encounter (HOSPITAL_COMMUNITY): Payer: Self-pay | Admitting: Gastroenterology

## 2022-01-26 ENCOUNTER — Ambulatory Visit (HOSPITAL_COMMUNITY): Payer: Medicare Other | Admitting: Anesthesiology

## 2022-01-26 ENCOUNTER — Other Ambulatory Visit: Payer: Self-pay

## 2022-01-26 ENCOUNTER — Ambulatory Visit (HOSPITAL_COMMUNITY)
Admission: RE | Admit: 2022-01-26 | Discharge: 2022-01-26 | Disposition: A | Discharge status: Home / Self Care | Payer: Medicare Other | Attending: Gastroenterology | Admitting: Gastroenterology

## 2022-01-26 ENCOUNTER — Ambulatory Visit (HOSPITAL_BASED_OUTPATIENT_CLINIC_OR_DEPARTMENT_OTHER): Payer: Medicare Other | Admitting: Anesthesiology

## 2022-01-26 ENCOUNTER — Encounter: Payer: Self-pay | Admitting: Nutrition

## 2022-01-26 ENCOUNTER — Encounter (HOSPITAL_COMMUNITY)
Admission: RE | Disposition: A | Discharge status: Home / Self Care | Payer: Self-pay | Source: Home / Self Care | Attending: Gastroenterology

## 2022-01-26 DIAGNOSIS — E039 Hypothyroidism, unspecified: Secondary | ICD-10-CM | POA: Insufficient documentation

## 2022-01-26 DIAGNOSIS — K209 Esophagitis, unspecified without bleeding: Secondary | ICD-10-CM | POA: Diagnosis not present

## 2022-01-26 DIAGNOSIS — F419 Anxiety disorder, unspecified: Secondary | ICD-10-CM | POA: Diagnosis not present

## 2022-01-26 DIAGNOSIS — K2289 Other specified disease of esophagus: Secondary | ICD-10-CM | POA: Diagnosis not present

## 2022-01-26 DIAGNOSIS — Z79899 Other long term (current) drug therapy: Secondary | ICD-10-CM | POA: Diagnosis not present

## 2022-01-26 DIAGNOSIS — G709 Myoneural disorder, unspecified: Secondary | ICD-10-CM | POA: Insufficient documentation

## 2022-01-26 DIAGNOSIS — K219 Gastro-esophageal reflux disease without esophagitis: Secondary | ICD-10-CM | POA: Diagnosis not present

## 2022-01-26 DIAGNOSIS — M797 Fibromyalgia: Secondary | ICD-10-CM | POA: Insufficient documentation

## 2022-01-26 DIAGNOSIS — K2970 Gastritis, unspecified, without bleeding: Secondary | ICD-10-CM | POA: Diagnosis not present

## 2022-01-26 DIAGNOSIS — I10 Essential (primary) hypertension: Secondary | ICD-10-CM | POA: Diagnosis not present

## 2022-01-26 DIAGNOSIS — D630 Anemia in neoplastic disease: Secondary | ICD-10-CM | POA: Diagnosis not present

## 2022-01-26 DIAGNOSIS — Z9889 Other specified postprocedural states: Secondary | ICD-10-CM | POA: Diagnosis not present

## 2022-01-26 DIAGNOSIS — K8689 Other specified diseases of pancreas: Secondary | ICD-10-CM

## 2022-01-26 DIAGNOSIS — C259 Malignant neoplasm of pancreas, unspecified: Secondary | ICD-10-CM | POA: Diagnosis not present

## 2022-01-26 DIAGNOSIS — Z950 Presence of cardiac pacemaker: Secondary | ICD-10-CM | POA: Diagnosis not present

## 2022-01-26 DIAGNOSIS — I480 Paroxysmal atrial fibrillation: Secondary | ICD-10-CM

## 2022-01-26 DIAGNOSIS — Z7901 Long term (current) use of anticoagulants: Secondary | ICD-10-CM | POA: Diagnosis not present

## 2022-01-26 HISTORY — PX: BIOPSY: SHX5522

## 2022-01-26 HISTORY — PX: FIDUCIAL MARKER PLACEMENT: SHX6858

## 2022-01-26 HISTORY — PX: FOREIGN BODY REMOVAL: SHX962

## 2022-01-26 HISTORY — PX: EUS: SHX5427

## 2022-01-26 HISTORY — PX: ESOPHAGOGASTRODUODENOSCOPY: SHX5428

## 2022-01-26 SURGERY — UPPER ENDOSCOPIC ULTRASOUND (EUS) RADIAL
Anesthesia: Monitor Anesthesia Care

## 2022-01-26 MED ORDER — PROPOFOL 500 MG/50ML IV EMUL
INTRAVENOUS | Status: AC
  Filled 2022-01-26: qty 50

## 2022-01-26 MED ORDER — LACTATED RINGERS IV SOLN: INTRAVENOUS | Status: DC

## 2022-01-26 MED ORDER — PANTOPRAZOLE SODIUM 40 MG PO TBEC: 40.0000 mg | DELAYED_RELEASE_TABLET | Freq: Every day | ORAL | 6 refills | Status: DC

## 2022-01-26 MED ORDER — PROPOFOL 10 MG/ML IV BOLUS
INTRAVENOUS | Status: DC | PRN
  Administered 2022-01-26 (×3): 10 mg via INTRAVENOUS
  Administered 2022-01-26: 20 mg via INTRAVENOUS
  Administered 2022-01-26: 10 mg via INTRAVENOUS

## 2022-01-26 MED ORDER — SODIUM CHLORIDE 0.9 % IV SOLN: INTRAVENOUS | Status: DC

## 2022-01-26 MED ORDER — PROPOFOL 10 MG/ML IV BOLUS
INTRAVENOUS | Status: AC
  Filled 2022-01-26: qty 20

## 2022-01-26 MED ORDER — LIDOCAINE HCL 1 % IJ SOLN
INTRAMUSCULAR | Status: DC | PRN
  Administered 2022-01-26 (×2): 50 mg via INTRADERMAL

## 2022-01-26 MED ORDER — APIXABAN 5 MG PO TABS: 5.0000 mg | ORAL_TABLET | Freq: Two times a day (BID) | ORAL | 10 refills | Status: DC

## 2022-01-26 MED ORDER — CIPROFLOXACIN IN D5W 400 MG/200ML IV SOLN
INTRAVENOUS | Status: DC | PRN
  Administered 2022-01-26: 400 mg via INTRAVENOUS

## 2022-01-26 MED ORDER — CIPROFLOXACIN IN D5W 400 MG/200ML IV SOLN
INTRAVENOUS | Status: AC
  Filled 2022-01-26: qty 200

## 2022-01-26 MED ORDER — PROPOFOL 500 MG/50ML IV EMUL
INTRAVENOUS | Status: DC | PRN
  Administered 2022-01-26: 80 ug/kg/min via INTRAVENOUS

## 2022-01-26 SURGICAL SUPPLY — 1 items: covidien fiducial marker ×2 IMPLANT

## 2022-01-26 NOTE — Transfer of Care (Signed)
Immediate Anesthesia Transfer of Care Note  Patient: Shannon Ortiz  Procedure(s) Performed: UPPER ENDOSCOPIC ULTRASOUND (EUS) RADIAL FIDUCIAL MARKER PLACEMENT BIOPSY ESOPHAGOGASTRODUODENOSCOPY (EGD) FOREIGN BODY REMOVAL  Patient Location: PACU and Endoscopy Unit  Anesthesia Type:MAC  Level of Consciousness: awake, alert , oriented and patient cooperative  Airway & Oxygen Therapy: Patient Spontanous Breathing and Patient connected to face mask oxygen  Post-op Assessment: Report given to RN and Post -op Vital signs reviewed and stable  Post vital signs: Reviewed and stable  Last Vitals:  Vitals Value Taken Time  BP    Temp    Pulse    Resp    SpO2      Last Pain:  Vitals:   01/26/22 1059  TempSrc: Oral  PainSc: 0-No pain         Complications: No notable events documented.

## 2022-01-26 NOTE — Progress Notes (Signed)
Patient did not show up for nutrition appointment. 

## 2022-01-26 NOTE — H&P (Addendum)
GASTROENTEROLOGY PROCEDURE H&P NOTE   Primary Care Physician: Lawerance Cruel, MD  HPI: Shannon Ortiz is a 86 y.o. female who presents for EGD/EUS for fiducial marker placement and recently diagnosed pancreas cancer.  Past Medical History:  Diagnosis Date   Acquired diverticulum of distal esophagus with dysphagia 10/03/2018   AKI (acute kidney injury) ([place]) 08/31/2018   Anxiety    Bradycardia 02/16/2014   Chest tightness    DDD (degenerative disc disease), lumbar    DJD (degenerative joint disease)    Dry eyes    Esophageal stenosis 08/31/2018   Facial pain    Fibromyalgia    Hemorrhoid    Hypertension    Hypokalemia 08/31/2018   Hypothyroidism    Lightheadedness    Near syncope 02/16/2014   Osteopenia    Sigmoid diverticulosis    Thyroid nodule    PT DENIES   Past Surgical History:  Procedure Laterality Date   ABDOMINAL HYSTERECTOMY     COMPLETE   BALLOON DILATION N/A 09/01/2018   Procedure: BALLOON DILATION;  Surgeon: Ronnette Juniper, MD;  Location: [place];  Service: Gastroenterology;  Laterality: N/A;   BIOPSY  09/01/2018   Procedure: BIOPSY;  Surgeon: Ronnette Juniper, MD;  [facility] ENDOSCOPY;  Service: Gastroenterology;;   BOTOX INJECTION  09/01/2018   Procedure: BOTOX INJECTION;  Surgeon: Ronnette Juniper, MD;  Location: [facility] ENDOSCOPY;  Service: Gastroenterology;;   BREAST SURGERY     breast biopsy   CHOLECYSTECTOMY     ESOPHAGEAL MANOMETRY N/A 10/07/2018   Procedure: ESOPHAGEAL MANOMETRY (EM);  Surgeon: Ronnette Juniper, MD;  Location: [facility]OS[facility];  Service: Gastroenterology;  Laterality: N/A;   ESOPHAGOGASTRODUODENOSCOPY N/A 10/25/2018   Procedure: ESOPHAGOGASTRODUODENOSCOPY (EGD);  Surgeon: Michael Boston, MD;  Location: [facility];  Service: General;  Laterality: N/A;   ESOPHAGOGASTRODUODENOSCOPY (EGD) WITH PROPOFOL N/A 09/01/2018   Procedure: ESOPHAGOGASTRODUODENOSCOPY (EGD) WITH PROPOFOL;  Surgeon: Ronnette Juniper, MD;  Location: [place];  Service: Gastroenterology;   Laterality: N/A;   ESOPHAGOGASTRODUODENOSCOPY (EGD) WITH PROPOFOL N/A 12/30/2021   Procedure: ESOPHAGOGASTRODUODENOSCOPY (EGD) WITH PROPOFOL;  Surgeon: Arta Silence, MD;  Location: [facility];  Service: Gastroenterology;  Laterality: N/A;   FINE NEEDLE ASPIRATION N/A 12/30/2021   Procedure: FINE NEEDLE ASPIRATION (FNA) LINEAR;  Surgeon: Arta Silence, MD;  Location: [facility];  Service: Gastroenterology;  Laterality: N/A;   PACEMAKER IMPLANT N/A 03/06/2019   Procedure: PACEMAKER IMPLANT;  Surgeon: Evans Lance, MD;  Location: [place] CV LAB;  Service: Cardiovascular;  Laterality: N/A;   UPPER ESOPHAGEAL ENDOSCOPIC ULTRASOUND (EUS) Bilateral 12/30/2021   Procedure: UPPER ESOPHAGEAL ENDOSCOPIC ULTRASOUND (EUS);  Surgeon: Arta Silence, MD;  Location: [facility];  Service: Gastroenterology;  Laterality: Bilateral;   Current Facility-Administered Medications  Medication Dose Route Frequency Provider Last Rate Last Admin   0.9 %  sodium chloride infusion   Intravenous Continuous Mansouraty, Telford Nab., MD        Current Facility-Administered Medications:    0.9 %  sodium chloride infusion, , Intravenous, Continuous, Mansouraty, Telford Nab., MD Allergies  Allergen Reactions   Ace Inhibitors Cough   Elavil [Amitriptyline Hcl] Hives   Ketek [Telithromycin] Hives   Family History  Problem Relation Age of Onset   Hypertension Mother    Diabetes Mother        non-insulin dependant   CAD Brother    Cancer Maternal Aunt    Cancer Maternal Uncle    Social History   Socioeconomic History   Marital status: Widowed    Spouse name: Not  on file   Number of children: Not on file   Years of education: Not on file   Highest education level: Not on file  Occupational History   Not on file  Tobacco Use   Smoking status: Never   Smokeless tobacco: Never  Vaping Use   Vaping Use: Never used  Substance and Sexual Activity   Alcohol use: No   Drug use: No   Sexual activity:  Never  Other Topics Concern   Not on file  Social History Narrative   Not on file   Social Determinants of Health   Financial Resource Strain: Not on file  Food Insecurity: Not on file  Transportation Needs: Not on file  Physical Activity: Not on file  Stress: Not on file  Social Connections: Not on file  Intimate Partner Violence: Not on file    Physical Exam: There were no vitals filed for this visit. There is no height or weight on file to calculate BMI. GEN: NAD EYE: Sclerae anicteric ENT: MMM CV: Non-tachycardic GI: Soft, tachycardic with paroxysmal A-fib noted NEURO:  Alert & Oriented x 3  Lab Results: No results for input(s): "WBC", "HGB", "HCT", "PLT" in the last 72 hours. BMET No results for input(s): "NA", "K", "CL", "CO2", "GLUCOSE", "BUN", "CREATININE", "CALCIUM" in the last 72 hours. LFT No results for input(s): "PROT", "ALBUMIN", "AST", "ALT", "ALKPHOS", "BILITOT", "BILIDIR", "IBILI" in the last 72 hours. PT/INR No results for input(s): "LABPROT", "INR" in the last 72 hours.   Impression / Plan: This is a 86 y.o.female who presents for EGD/EUS for fiducial marker placement and recently diagnosed pancreas cancer.  The risks of an EUS including intestinal perforation, bleeding, infection, aspiration, and medication effects were discussed as was the possibility it may not give a definitive diagnosis if a biopsy is performed.  When a biopsy of the pancreas is done as part of the EUS, there is an additional risk of pancreatitis at the rate of about 1-2%.  It was explained that procedure related pancreatitis is typically mild, although it can be severe and even life threatening, which is why we do not perform random pancreatic biopsies and only biopsy a lesion/area we feel is concerning enough to warrant the risk.  The risks and benefits of endoscopic evaluation/treatment were discussed with the patient and/or family; these include but are not limited to the risk of  perforation, infection, bleeding, missed lesions, lack of diagnosis, severe illness requiring hospitalization, as well as anesthesia and sedation related illnesses.  The patient's history has been reviewed, patient examined, no change in status, and deemed stable for procedure.  The patient and/or family is agreeable to proceed.    Justice Britain, MD [facility] Advanced [facility]oscopy Office # [phone]

## 2022-01-26 NOTE — Anesthesia Preprocedure Evaluation (Addendum)
Anesthesia Evaluation  Patient identified by MRN, date of birth, ID band Patient awake    Reviewed: Allergy & Precautions, NPO status , Patient's Chart, lab work & pertinent test results  Airway Mallampati: II  TM Distance: >3 FB Neck ROM: Full    Dental  (+) Dental Advisory Given, Edentulous Lower, Edentulous Upper   Pulmonary neg pulmonary ROS,    Pulmonary exam normal breath sounds clear to auscultation       Cardiovascular hypertension, Pt. on medications and Pt. on home beta blockers + pacemaker (PPM for SND)  Rhythm:Irregular Rate:Tachycardia     Neuro/Psych PSYCHIATRIC DISORDERS Anxiety  Neuromuscular disease    GI/Hepatic hiatal hernia, GERD  Medicated,Pancreatic mass   Endo/Other  Hypothyroidism   Renal/GU Renal disease     Musculoskeletal  (+) Arthritis , Fibromyalgia -  Abdominal   Peds  Hematology  (+) Blood dyscrasia (Eliquis), anemia ,   Anesthesia Other Findings Day of surgery medications reviewed with the patient.  Reproductive/Obstetrics                           Anesthesia Physical  Anesthesia Plan  ASA: 3  Anesthesia Plan: MAC   Post-op Pain Management: Minimal or no pain anticipated   Induction: Intravenous  PONV Risk Score and Plan: 2 and TIVA and Treatment may vary due to age or medical condition  Airway Management Planned: Nasal Cannula and Natural Airway  Additional Equipment:   Intra-op Plan:   Post-operative Plan:   Informed Consent: I have reviewed the patients History and Physical, chart, labs and discussed the procedure including the risks, benefits and alternatives for the proposed anesthesia with the patient or authorized representative who has indicated his/her understanding and acceptance.     Dental advisory given  Plan Discussed with: CRNA and Anesthesiologist  Anesthesia Plan Comments: (Afib with RVR in 140's in preop. Known paroxysmal  afib. Placed on monitor. Self converted. Frequent PVCs and PACs noted. At times in SR with HR in 80s)      Anesthesia Quick Evaluation

## 2022-01-26 NOTE — Anesthesia Postprocedure Evaluation (Signed)
Anesthesia Post Note  Patient: Shannon Ortiz  Procedure(s) Performed: UPPER ENDOSCOPIC ULTRASOUND (EUS) RADIAL FIDUCIAL MARKER PLACEMENT BIOPSY ESOPHAGOGASTRODUODENOSCOPY (EGD) FOREIGN BODY REMOVAL     Patient location during evaluation: PACU Anesthesia Type: MAC Level of consciousness: awake and alert Pain management: pain level controlled Vital Signs Assessment: post-procedure vital signs reviewed and stable Respiratory status: spontaneous breathing Cardiovascular status: stable Anesthetic complications: no   No notable events documented.  Last Vitals:  Vitals:   01/26/22 1255 01/26/22 1300  BP:  131/75  Pulse: 60 61  Resp: 18 19  Temp:    SpO2: 99% 97%    Last Pain:  Vitals:   01/26/22 1233  TempSrc: Temporal  PainSc: 0-No pain                 Nolon Nations

## 2022-01-26 NOTE — Op Note (Signed)
[facility] Patient Name: Shannon Ortiz Procedure Date: 01/26/2022 MRN: [id number] Attending MD: Justice Britain , MD Date of Birth: 11-09-1935 CSN: [id number] Age: 86 Admit Type: Outpatient Procedure:                Upper EUS Indications:              For evaluation of pancreatic adenocarcinoma,                            Fiducial Providers:                Justice Britain, MD, Doristine Johns, RN,                            Gloris Ham, Technician Referring MD:             Truitt Merle, Jodelle Gross, Truitt Merle, Ronnette Juniper, MD,                            Arta Silence, MD Medicines:                Monitored Anesthesia Care Complications:            No immediate complications. Estimated Blood Loss:     Estimated blood loss was minimal. Procedure:                Pre-Anesthesia Assessment:                           - Prior to the procedure, a History and Physical                            was performed, and patient medications and                            allergies were reviewed. The patient's tolerance of                            previous anesthesia was also reviewed. The risks                            and benefits of the procedure and the sedation                            options and risks were discussed with the patient.                            All questions were answered, and informed consent                            was obtained. Prior Anticoagulants: The patient has                            taken Eliquis (apixaban), last dose was 2 days  prior to procedure. ASA Grade Assessment: III - A                            patient with severe systemic disease. After                            reviewing the risks and benefits, the patient was                            deemed in satisfactory condition to undergo the                            procedure.                           After obtaining informed consent, the endoscope  was                            passed under direct vision. Throughout the                            procedure, the patient's blood pressure, pulse, and                            oxygen saturations were monitored continuously. The                            [id number]-H190 ([id number]) Olympus endoscope was introduced                            through the mouth, and advanced to the second part                            of duodenum. The [id number]-[id number] ([id number]) Olympus                            duodenoscope was introduced through the mouth, and                            advanced to the area of papilla. The [id number]                            ([id number]) Olympus linear ultrasound scope was                            introduced through the mouth, and advanced to the                            duodenum for ultrasound examination from the                            stomach and duodenum. The upper EUS was  accomplished without difficulty. The patient                            tolerated the procedure. Scope In: Scope Out: Findings:      ENDOSCOPIC FINDING: :      White nummular lesions were noted in the entire esophagus. Biopsies were       taken with a cold forceps for histology.      A previous surgical anastomosis was found in the distal esophagus -       query previous [facility] repair with a slight shelf-like appearance. Three       staples were present removal was accomplished with a regular forceps.      The Z-line was irregular and was found 40 cm from the incisors.      Segmental moderate inflammation characterized by erosions, friability       and granularity was found in the entire examined stomach. Biopsies were       taken with a cold forceps for histology and Helicobacter pylori testing.      No gross lesions were noted in the duodenal bulb, in the first portion       of the duodenum and in the second portion of the duodenum. Ampulla       slightly congested but in  normal positioning.      ENDOSONOGRAPHIC FINDING: :      An irregular mass was identified in the pancreatic head. The mass was       hypoechoic. The mass measured 30 mm by 23 mm in maximal cross-sectional       diameter. The outer margins were irregular. The remainder of the       pancreas was examined. The endosonographic appearance of parenchyma and       the upstream pancreatic duct indicated duct dilation, a beaded ductal       appearance and parenchymal atrophy. Fiducial marker placement was       performed. Once the target lesion in the pancreas was identified a       preloaded marker in a 22 gauge needle was then deployed in and around       the lesion. This was repeated for a total of four markers.      There was no sign of significant endosonographic abnormality in the       common bile duct. The maximum diameter of the duct was 8 mm.      The celiac region was visualized. Impression:               EGD Impression:                           - White nummular lesions in esophageal mucosa.                            Biopsied to rule out Candida.                           - A previous surgical anastomosis was found - query                            previous [place] repair with 3 staples noted - these  were removed. There was a slight shelf-like                            appearance distally.                           - Z-line irregular, 40 cm from the incisors.                           - Moderate gastritis. Biopsied.                           - No gross lesions in the duodenal bulb, in the                            first portion of the duodenum and in the second                            portion of the duodenum. Slightly congested ampulla                            but in normal positioning.                           EUS Impression:                           - A mass was identified in the pancreatic head. A                            tissue diagnosis was  obtained prior to this exam.                            This is consistent with adenocarcinoma. Fiducial                            markers were deployed.                           - There was no sign of significant pathology in the                            common bile duct. Moderate Sedation:      Not Applicable - Patient had care per Anesthesia. Recommendation:           - The patient will be observed post-procedure,                            until all discharge criteria are met.                           - Discharge patient to home.                           - Patient has a contact number available for  emergencies. The signs and symptoms of potential                            delayed complications were discussed with the                            patient. Return to normal activities tomorrow.                            Written discharge instructions were provided to the                            patient.                           - Low fat diet for 1 week.                           - Observe patient's clinical course.                           - Protonix 40 mg daily.                           - Eliquis restart in 2-days (8/16 AM) to decrease                            post-interventional bleeding risks.                           - Return to Oncology/Radiation Oncology as                            scheduled.                           - The findings and recommendations were discussed                            with the patient.                           - The findings and recommendations were discussed                            with the patient's family. Procedure Code(s):        --- Professional ---                           [phone], Esophagogastroduodenoscopy, flexible,                            transoral; with transendoscopic ultrasound-guided                            transmural injection of diagnostic or therapeutic  substance(s) (eg, anesthetic, neurolytic agent) or                            fiducial marker(s) (includes endoscopic ultrasound                            examination of the esophagus, stomach, and either                            the duodenum or a surgically altered stomach where                            the jejunum is examined distal to the anastomosis)                           43247, Esophagogastroduodenoscopy, flexible,                            transoral; with removal of foreign body(s) Diagnosis Code(s):        --- Professional ---                           K22.8, Other specified diseases of esophagus                           Z98.890, Other specified postprocedural states                           K29.70, Gastritis, unspecified, without bleeding                           K86.89, Other specified diseases of pancreas                           C25.9, Malignant neoplasm of pancreas, unspecified CPT copyright 2019 [employer]. All rights reserved. The codes documented in this report are preliminary and upon coder review may  be revised to meet current compliance requirements. Justice Britain, MD 01/26/2022 12:45:03 PM Number of Addenda: 0

## 2022-01-27 ENCOUNTER — Encounter: Payer: Self-pay | Admitting: Gastroenterology

## 2022-01-27 ENCOUNTER — Encounter (HOSPITAL_COMMUNITY): Payer: Self-pay | Admitting: Gastroenterology

## 2022-01-28 LAB — SURGICAL PATHOLOGY

## 2022-01-30 ENCOUNTER — Ambulatory Visit: Payer: Medicare Other | Admitting: Radiation Oncology

## 2022-01-30 ENCOUNTER — Ambulatory Visit (INDEPENDENT_AMBULATORY_CARE_PROVIDER_SITE_OTHER): Payer: Medicare Other

## 2022-01-30 DIAGNOSIS — I495 Sick sinus syndrome: Secondary | ICD-10-CM | POA: Diagnosis not present

## 2022-02-02 LAB — CUP PACEART REMOTE DEVICE CHECK
Battery Remaining Longevity: 74 mo
Battery Remaining Percentage: 69 %
Battery Voltage: 2.99 V
Brady Statistic AP VP Percent: 2 %
Brady Statistic AP VS Percent: 92 %
Brady Statistic AS VP Percent: 1 %
Brady Statistic AS VS Percent: 2 %
Brady Statistic RA Percent Paced: 85 %
Brady Statistic RV Percent Paced: 4.7 %
Date Time Interrogation Session: 20230819015013
Implantable Lead Implant Date: 20200921
Implantable Lead Implant Date: 20200921
Implantable Lead Location: 753859
Implantable Lead Location: 753860
Implantable Pulse Generator Implant Date: 20200921
Lead Channel Impedance Value: 390 Ohm
Lead Channel Impedance Value: 630 Ohm
Lead Channel Pacing Threshold Amplitude: 1 V
Lead Channel Pacing Threshold Amplitude: 1.875 V
Lead Channel Pacing Threshold Pulse Width: 0.5 ms
Lead Channel Pacing Threshold Pulse Width: 0.5 ms
Lead Channel Sensing Intrinsic Amplitude: 1.8 mV
Lead Channel Sensing Intrinsic Amplitude: 12 mV
Lead Channel Setting Pacing Amplitude: 1.25 V
Lead Channel Setting Pacing Amplitude: 3.375
Lead Channel Setting Pacing Pulse Width: 0.5 ms
Lead Channel Setting Sensing Sensitivity: 2 mV
Pulse Gen Model: 2272
Pulse Gen Serial Number: 9162202

## 2022-02-03 ENCOUNTER — Other Ambulatory Visit: Payer: Self-pay

## 2022-02-03 ENCOUNTER — Ambulatory Visit
Admission: RE | Admit: 2022-02-03 | Discharge: 2022-02-03 | Disposition: A | Discharge status: Home / Self Care | Payer: Medicare Other | Source: Ambulatory Visit | Attending: Radiation Oncology | Admitting: Radiation Oncology

## 2022-02-03 VITALS — BP 171/107 | HR 68 | Temp 97.9°F | Resp 18

## 2022-02-03 DIAGNOSIS — C250 Malignant neoplasm of head of pancreas: Secondary | ICD-10-CM

## 2022-02-03 MED ORDER — SODIUM CHLORIDE 0.9% FLUSH
10.0000 mL | Freq: Once | INTRAVENOUS | Status: AC
  Administered 2022-02-03: 10 mL via INTRAVENOUS

## 2022-02-03 NOTE — Progress Notes (Signed)
Has armband been applied?    Does patient have an allergy to IV contrast dye?: No   Has patient ever received premedication for IV contrast dye?: n/a   Does patient take metformin?: No  If patient does take metformin when was the last dose: n/a  Date of lab work: 01/09/2022 BUN: 16 CR: 0.81 eGfr: >60   IV site: LAC  Has IV site been added to flowsheet?  Yes  BP (!) 171/107   Pulse 68   Temp 97.9 F (36.6 C)   Resp 18   SpO2 100%

## 2022-02-09 ENCOUNTER — Encounter: Payer: Self-pay | Admitting: Genetic Counselor

## 2022-02-09 DIAGNOSIS — Z1379 Encounter for other screening for genetic and chromosomal anomalies: Secondary | ICD-10-CM | POA: Insufficient documentation

## 2022-02-09 DIAGNOSIS — Z1589 Genetic susceptibility to other disease: Secondary | ICD-10-CM | POA: Insufficient documentation

## 2022-02-13 DIAGNOSIS — C250 Malignant neoplasm of head of pancreas: Secondary | ICD-10-CM | POA: Diagnosis not present

## 2022-02-18 ENCOUNTER — Ambulatory Visit: Payer: Medicare Other | Admitting: Radiation Oncology

## 2022-02-18 DIAGNOSIS — C250 Malignant neoplasm of head of pancreas: Secondary | ICD-10-CM | POA: Insufficient documentation

## 2022-02-19 ENCOUNTER — Ambulatory Visit: Payer: Medicare Other

## 2022-02-20 ENCOUNTER — Ambulatory Visit: Payer: Medicare Other

## 2022-02-20 ENCOUNTER — Ambulatory Visit: Payer: Medicare Other | Admitting: Radiation Oncology

## 2022-02-23 ENCOUNTER — Other Ambulatory Visit: Payer: Self-pay

## 2022-02-23 ENCOUNTER — Ambulatory Visit
Admission: RE | Admit: 2022-02-23 | Discharge: 2022-02-23 | Disposition: A | Discharge status: Home / Self Care | Payer: Medicare Other | Source: Ambulatory Visit | Attending: Radiation Oncology | Admitting: Radiation Oncology

## 2022-02-23 DIAGNOSIS — C250 Malignant neoplasm of head of pancreas: Secondary | ICD-10-CM | POA: Diagnosis not present

## 2022-02-23 LAB — RAD ONC ARIA SESSION SUMMARY
Course Elapsed Days: 0
Plan Fractions Treated to Date: 1
Plan Prescribed Dose Per Fraction: 6.6 Gy
Plan Total Fractions Prescribed: 5
Plan Total Prescribed Dose: 33 Gy
Reference Point Dosage Given to Date: 6.6 Gy
Reference Point Session Dosage Given: 6.6 Gy
Session Number: 1

## 2022-02-23 NOTE — Progress Notes (Signed)
Patient on LINAC 1 for pacemaker monitoring.  Patient alert and verbally responsive no signs of distress observed.  Heart rate between 78-90 bpm.

## 2022-02-24 ENCOUNTER — Ambulatory Visit: Payer: Medicare Other

## 2022-02-25 ENCOUNTER — Ambulatory Visit
Admission: RE | Admit: 2022-02-25 | Discharge: 2022-02-25 | Disposition: A | Discharge status: Home / Self Care | Payer: Medicare Other | Source: Ambulatory Visit | Attending: Radiation Oncology | Admitting: Radiation Oncology

## 2022-02-25 ENCOUNTER — Other Ambulatory Visit: Payer: Self-pay

## 2022-02-25 DIAGNOSIS — C250 Malignant neoplasm of head of pancreas: Secondary | ICD-10-CM | POA: Diagnosis not present

## 2022-02-25 LAB — RAD ONC ARIA SESSION SUMMARY
Course Elapsed Days: 2
Plan Fractions Treated to Date: 2
Plan Prescribed Dose Per Fraction: 6.6 Gy
Plan Total Fractions Prescribed: 5
Plan Total Prescribed Dose: 33 Gy
Reference Point Dosage Given to Date: 13.2 Gy
Reference Point Session Dosage Given: 6.6 Gy
Session Number: 2

## 2022-02-25 NOTE — Progress Notes (Signed)
Remote pacemaker transmission.   

## 2022-02-27 ENCOUNTER — Other Ambulatory Visit: Payer: Self-pay

## 2022-02-27 ENCOUNTER — Ambulatory Visit
Admission: RE | Admit: 2022-02-27 | Discharge: 2022-02-27 | Disposition: A | Discharge status: Home / Self Care | Payer: Medicare Other | Source: Ambulatory Visit | Attending: Radiation Oncology | Admitting: Radiation Oncology

## 2022-02-27 ENCOUNTER — Telehealth: Payer: Self-pay | Admitting: Genetic Counselor

## 2022-02-27 ENCOUNTER — Inpatient Hospital Stay: Payer: Medicare Other | Admitting: Hematology

## 2022-02-27 ENCOUNTER — Inpatient Hospital Stay: Payer: Medicare Other | Attending: Physician Assistant

## 2022-02-27 DIAGNOSIS — C250 Malignant neoplasm of head of pancreas: Secondary | ICD-10-CM | POA: Diagnosis not present

## 2022-02-27 LAB — RAD ONC ARIA SESSION SUMMARY
Course Elapsed Days: 4
Plan Fractions Treated to Date: 3
Plan Prescribed Dose Per Fraction: 6.6 Gy
Plan Total Fractions Prescribed: 5
Plan Total Prescribed Dose: 33 Gy
Reference Point Dosage Given to Date: 19.8 Gy
Reference Point Session Dosage Given: 6.6 Gy
Session Number: 3

## 2022-02-27 NOTE — Telephone Encounter (Signed)
Left message with changed upcoming appointment from in-person to telephone visit per patient's request.

## 2022-03-03 ENCOUNTER — Other Ambulatory Visit: Payer: Self-pay

## 2022-03-03 ENCOUNTER — Ambulatory Visit
Admission: RE | Admit: 2022-03-03 | Discharge: 2022-03-03 | Disposition: A | Discharge status: Home / Self Care | Payer: Medicare Other | Source: Ambulatory Visit | Attending: Radiation Oncology | Admitting: Radiation Oncology

## 2022-03-03 DIAGNOSIS — C250 Malignant neoplasm of head of pancreas: Secondary | ICD-10-CM | POA: Diagnosis not present

## 2022-03-03 LAB — RAD ONC ARIA SESSION SUMMARY
Course Elapsed Days: 8
Plan Fractions Treated to Date: 4
Plan Prescribed Dose Per Fraction: 6.6 Gy
Plan Total Fractions Prescribed: 5
Plan Total Prescribed Dose: 33 Gy
Reference Point Dosage Given to Date: 26.4 Gy
Reference Point Session Dosage Given: 6.6 Gy
Session Number: 4

## 2022-03-05 ENCOUNTER — Ambulatory Visit
Admission: RE | Admit: 2022-03-05 | Discharge: 2022-03-05 | Disposition: A | Discharge status: Home / Self Care | Payer: Medicare Other | Source: Ambulatory Visit | Attending: Radiation Oncology | Admitting: Radiation Oncology

## 2022-03-05 ENCOUNTER — Other Ambulatory Visit: Payer: Self-pay

## 2022-03-05 ENCOUNTER — Ambulatory Visit: Payer: Medicare Other

## 2022-03-05 DIAGNOSIS — C250 Malignant neoplasm of head of pancreas: Secondary | ICD-10-CM | POA: Diagnosis not present

## 2022-03-05 DIAGNOSIS — Z510 Encounter for antineoplastic radiation therapy: Secondary | ICD-10-CM | POA: Diagnosis not present

## 2022-03-05 LAB — RAD ONC ARIA SESSION SUMMARY
Course Elapsed Days: 10
Plan Fractions Treated to Date: 5
Plan Prescribed Dose Per Fraction: 6.6 Gy
Plan Total Fractions Prescribed: 5
Plan Total Prescribed Dose: 33 Gy
Reference Point Dosage Given to Date: 33 Gy
Reference Point Session Dosage Given: 6.6 Gy
Session Number: 5

## 2022-03-06 NOTE — Progress Notes (Signed)
  Radiation Oncology         [phone] ________________________________  Name: Shannon Ortiz MRN: [id number]  Date: 03/05/2022  DOB: [birth year]  End of Treatment Note  Diagnosis:   pancreatic cancer     Indication for treatment::  curative       Radiation treatment dates:   02/23/22-03/05/22  Site/dose:   The pancreas initially was treated to a dose of 45 Gy in 25 fractions using an IMRT technique. A 5.4 Gy boost was then giveto yield a total dose of 50.4 Gy.  Narrative: The patient tolerated radiation treatment.   The patient was monitored during treatment for GI issues including nausea and diarrhea.  Plan: The patient will receive a call in about one month from the radiation oncology department. She will continue follow up with Dr. Burr Medico as well.        Carola Rhine, PAC

## 2022-03-12 ENCOUNTER — Other Ambulatory Visit: Payer: Medicare Other

## 2022-03-12 ENCOUNTER — Inpatient Hospital Stay: Payer: Medicare Other | Admitting: Genetic Counselor

## 2022-03-12 ENCOUNTER — Telehealth: Payer: Self-pay | Admitting: Genetic Counselor

## 2022-03-12 NOTE — Telephone Encounter (Signed)
Discussed genetic testing purpose and results.  Discussed that hereditary cause of her pancreatic cancer was not identified.  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  Discussed slight increased risk in schwannomas and association with Noonan syndrome.  Patient denied personal or family history of schwannomas.  She denied signs of Noonan syndrome including congential heart defects or developmental delays.   Shannon Ortiz preferred a face-to-face visit to discuss further with her daughter, Shannon Ortiz, present.  Genetics appt scheduled with Shannon Ortiz for 10/6 at 1pm, which prior to her appt with Dr. Burr Medico.  Shannon Ortiz confirmed time and voicemail with appt date/time left for daughter Shannon Ortiz.

## 2022-03-20 ENCOUNTER — Inpatient Hospital Stay: Payer: Medicare Other

## 2022-03-20 ENCOUNTER — Other Ambulatory Visit: Payer: Self-pay | Admitting: Hematology

## 2022-03-20 ENCOUNTER — Inpatient Hospital Stay: Payer: Medicare Other | Admitting: Genetic Counselor

## 2022-03-20 ENCOUNTER — Inpatient Hospital Stay: Payer: Medicare Other | Attending: Physician Assistant | Admitting: Hematology

## 2022-03-20 DIAGNOSIS — I119 Hypertensive heart disease without heart failure: Secondary | ICD-10-CM | POA: Insufficient documentation

## 2022-03-20 DIAGNOSIS — K5900 Constipation, unspecified: Secondary | ICD-10-CM | POA: Insufficient documentation

## 2022-03-20 DIAGNOSIS — R110 Nausea: Secondary | ICD-10-CM | POA: Insufficient documentation

## 2022-03-20 DIAGNOSIS — K8689 Other specified diseases of pancreas: Secondary | ICD-10-CM | POA: Insufficient documentation

## 2022-03-20 DIAGNOSIS — C250 Malignant neoplasm of head of pancreas: Secondary | ICD-10-CM

## 2022-03-20 DIAGNOSIS — Z7901 Long term (current) use of anticoagulants: Secondary | ICD-10-CM | POA: Insufficient documentation

## 2022-03-20 DIAGNOSIS — Z79899 Other long term (current) drug therapy: Secondary | ICD-10-CM | POA: Insufficient documentation

## 2022-03-20 DIAGNOSIS — I700 Atherosclerosis of aorta: Secondary | ICD-10-CM | POA: Insufficient documentation

## 2022-03-20 DIAGNOSIS — Z8719 Personal history of other diseases of the digestive system: Secondary | ICD-10-CM | POA: Insufficient documentation

## 2022-03-20 DIAGNOSIS — Z9049 Acquired absence of other specified parts of digestive tract: Secondary | ICD-10-CM | POA: Insufficient documentation

## 2022-03-20 DIAGNOSIS — D649 Anemia, unspecified: Secondary | ICD-10-CM | POA: Insufficient documentation

## 2022-03-26 ENCOUNTER — Telehealth: Payer: Self-pay

## 2022-03-26 ENCOUNTER — Other Ambulatory Visit: Payer: Self-pay

## 2022-03-26 ENCOUNTER — Inpatient Hospital Stay (HOSPITAL_BASED_OUTPATIENT_CLINIC_OR_DEPARTMENT_OTHER): Payer: Medicare Other | Admitting: Genetic Counselor

## 2022-03-26 ENCOUNTER — Encounter: Payer: Self-pay | Admitting: Genetic Counselor

## 2022-03-26 DIAGNOSIS — C250 Malignant neoplasm of head of pancreas: Secondary | ICD-10-CM

## 2022-03-26 DIAGNOSIS — Z1589 Genetic susceptibility to other disease: Secondary | ICD-10-CM

## 2022-03-26 DIAGNOSIS — Z1379 Encounter for other screening for genetic and chromosomal anomalies: Secondary | ICD-10-CM

## 2022-03-26 NOTE — Telephone Encounter (Signed)
LVM for pt's daughter to see if they can come in tomorrow 03/27/2022 at 1420 to see Dr. Burr Medico.  Asked pt's daughter to give Dr. Ernestina Penna office a call in the morning to state whether the pt can come in at that time.  Awaiting pt's daughter's response.

## 2022-03-26 NOTE — Telephone Encounter (Signed)
Pt and daughter were here today at the [facility] for genetic testing.  Pt's daughter was requesting for Dr. Burr Medico to see the pt today at 3:57pm (after the genetics appt today).  Informed pt's daughter that Dr. Burr Medico is currently in with a new patient appt and will be leaving for the day after that appt.  Pt's daughter stated that she was told that the pt was scheduled to see Dr. Burr Medico today since the pt did not show for the 03/20/2022 appt.  Informed pt's daughter that the pt was never rescheduled with Dr. Burr Medico for today.  Pt's daughter stated that the pt is losing a lot of weight and she would like for Dr. Burr Medico or her APP to see the pt.  Informed pt's daughter again that Dr. Burr Medico is currently in with a new patient and that Dr. Ernestina Penna NP has gone home for today.  Informed pt's daughter that I can try to get the pt scheduled for a f/u appt next week or when Dr. Burr Medico returns to the office the week of 04/06/2022.  Pt's daughter was upset that the pt cannot be seen today by Dr. Burr Medico.  Informed Dr. Burr Medico of the pt's unexpected visit and asked if she could give the pt a call.

## 2022-03-26 NOTE — Progress Notes (Signed)
REFERRING PROVIDER: Truitt Merle, MD [address] [place],  [place] [address]   PRIMARY PROVIDER:  Lawerance Cruel, MD  PRIMARY REASON FOR VISIT:  Encounter Diagnoses  Name Primary?   Monoallelic mutation of LZTR1 gene Yes   Genetic testing    Malignant neoplasm of head of pancreas ([place])      HISTORY OF PRESENT ILLNESS:   Shannon Ortiz, a 86 y.o. female, was seen for a [facility] cancer genetics consultation at the request of Dr. Burr Medico due to a personal history of pancreatic cancer.  Shannon Ortiz presents to clinic today to discuss results of point of care genetic testing and potential cancer risks for family members.   In July 2023, at the age of 81, Shannon Ortiz was diagnosed with pancreatic cancer.   CANCER HISTORY:  Oncology History Overview Note   Cancer Staging  Pancreatic cancer [facility]) Staging form: Exocrine Pancreas, AJCC 8th Edition - Clinical stage from 12/30/2021: Stage IB (cT2, cN0, cM0) - Signed by Truitt Merle, MD on 01/09/2022    Pancreatic cancer ([place])  12/15/2021 Imaging   CLINICAL DATA:  Abdominal pain   EXAM: CT ABDOMEN AND PELVIS WITH CONTRAST  IMPRESSION: 1. Marked main pancreatic ductal dilation and mild biliary ductal dilation with ill-defined soft tissue at the pancreatic head, findings are concerning for pancreatic malignancy. Recommend further evaluation with EUS/MRCP. 2. Nonspecific small solid nodule of the left lower lobe. Recommend attention on follow-up. 3. Cardiomegaly and aortic Atherosclerosis (ICD10-I70.0).   12/30/2021 Procedure   Upper EUS, Dr. Paulita Fujita  Impression: - A mass was identified in the pancreatic head. This was staged T3 (T4 if SMA invasion) N0 Mx by endosonographic criteria. Fine needle aspiration performed. - There was no evidence of significant pathology in the left lobe of the liver.   12/30/2021 Initial Biopsy   A. PANCREAS, HEAD OF PANCREAS, FINE NEEDLE  ASPIRATION:   FINAL MICROSCOPIC DIAGNOSIS:  - Malignant cells  consistent with adenocarcinoma   12/30/2021 Cancer Staging   Staging form: Exocrine Pancreas, AJCC 8th Edition - Clinical stage from 12/30/2021: Stage IB (cT2, cN0, cM0) - Signed by Truitt Merle, MD on 01/09/2022 Stage prefix: Initial diagnosis Total positive nodes: 0   01/09/2022 Initial Diagnosis   Pancreatic cancer [facility])    Genetic Testing   [facility] CancerNext-Expanded Panel was Positive. 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 Report date is 01/30/2022.  The CancerNext-Expanded gene panel offered by [facility] and includes sequencing, rearrangement, and RNA analysis for the following 77 genes: AIP, ALK, APC, ATM, AXIN2, BAP1, BARD1, BLM, BMPR1A, BRCA1, BRCA2, BRIP1, CDC73, CDH1, CDK4, CDKN1B, CDKN2A, CHEK2, CTNNA1, DICER1, FANCC, FH, FLCN, GALNT12, KIF1B, LZTR1, MAX, MEN1, MET, MLH1, MSH2, MSH3, MSH6, MUTYH, NBN, NF1, NF2, NTHL1, PALB2, PHOX2B, PMS2, POT1, PRKAR1A, PTCH1, PTEN, RAD51C, RAD51D, RB1, RECQL, RET, SDHA, SDHAF2, SDHB, SDHC, SDHD, SMAD4, SMARCA4, SMARCB1, SMARCE1, STK11, SUFU, TMEM127, TP53, TSC1, TSC2, VHL and XRCC2 (sequencing and deletion/duplication); EGFR, EGLN1, HOXB13, KIT, MITF, PDGFRA, POLD1, and POLE (sequencing only); EPCAM and GREM1 (deletion/duplication only).       Past Medical History:  Diagnosis Date   Acquired diverticulum of distal esophagus with dysphagia 10/03/2018   AKI (acute kidney injury) ([place]) 08/31/2018   Anxiety    Bradycardia 02/16/2014   Chest tightness    DDD (degenerative disc disease), lumbar    DJD (degenerative joint disease)    Dry eyes    Esophageal stenosis 08/31/2018   Facial pain    Fibromyalgia  Hemorrhoid    Hypertension    Hypokalemia 08/31/2018   Hypothyroidism    Lightheadedness    Near syncope 02/16/2014   Osteopenia    Sigmoid diverticulosis    Thyroid nodule    PT DENIES    Past Surgical History:  Procedure Laterality Date   ABDOMINAL HYSTERECTOMY     COMPLETE   BALLOON  DILATION N/A 09/01/2018   Procedure: BALLOON DILATION;  Surgeon: Ronnette Juniper, MD;  Location: [facility];  Service: Gastroenterology;  Laterality: N/A;   BIOPSY  09/01/2018   Procedure: BIOPSY;  Surgeon: Ronnette Juniper, MD;  Location: [facility] ENDOSCOPY;  Service: Gastroenterology;;   BIOPSY  01/26/2022   Procedure: BIOPSY;  Surgeon: Irving Copas., MD;  Location: [facility];  Service: Gastroenterology;;   BOTOX INJECTION  09/01/2018   Procedure: BOTOX INJECTION;  Surgeon: Ronnette Juniper, MD;  Location: [facility] ENDOSCOPY;  Service: Gastroenterology;;   BREAST SURGERY     breast biopsy   CHOLECYSTECTOMY     ESOPHAGEAL MANOMETRY N/A 10/07/2018   Procedure: ESOPHAGEAL MANOMETRY (EM);  Surgeon: Ronnette Juniper, MD;  Location: [facility] ENDOSCOPY;  Service: Gastroenterology;  Laterality: N/A;   ESOPHAGOGASTRODUODENOSCOPY N/A 10/25/2018   Procedure: ESOPHAGOGASTRODUODENOSCOPY (EGD);  Surgeon: Michael Boston, MD;  Location: [facility];  Service: General;  Laterality: N/A;   ESOPHAGOGASTRODUODENOSCOPY N/A 01/26/2022   Procedure: ESOPHAGOGASTRODUODENOSCOPY (EGD);  Surgeon: Irving Copas., MD;  Location: [facility];  Service: Gastroenterology;  Laterality: N/A;   ESOPHAGOGASTRODUODENOSCOPY (EGD) WITH PROPOFOL N/A 09/01/2018   Procedure: ESOPHAGOGASTRODUODENOSCOPY (EGD) WITH PROPOFOL;  Surgeon: Ronnette Juniper, MD;  Location: [facility];  Service: Gastroenterology;  Laterality: N/A;   ESOPHAGOGASTRODUODENOSCOPY (EGD) WITH PROPOFOL N/A 12/30/2021   Procedure: ESOPHAGOGASTRODUODENOSCOPY (EGD) WITH PROPOFOL;  Surgeon: Arta Silence, MD;  Location: [facility] ENDOS[facility];  Service: Gastroenterology;  Laterality: N/A;   EUS N/A 01/26/2022   Procedure: UPPER ENDOSCOPIC ULTRASOUND (EUS) RADIAL;  Surgeon: Irving Copas., MD;  Location: [facility] ENDOSCOPY;  Service: Gastroenterology;  Laterality: N/A;   FIDUCIAL MARKER PLACEMENT N/A 01/26/2022   Procedure: FIDUCIAL MARKER PLACEMENT;  Surgeon: Irving Copas., MD;  Location: [facility]  ENDOSCOPY;  Service: Gastroenterology;  Laterality: N/A;   FINE NEEDLE ASPIRATION N/A 12/30/2021   Procedure: FINE NEEDLE ASPIRATION (FNA) LINEAR;  Surgeon: Arta Silence, MD;  Location: [facility] ENDOSCOPY;  Service: Gastroenterology;  Laterality: N/A;   FOREIGN BODY REMOVAL  01/26/2022   Procedure: FOREIGN BODY REMOVAL;  Surgeon: Rush Landmark Telford Nab., MD;  Location: [facility];  Service: Gastroenterology;;   PACEMAKER IMPLANT N/A 03/06/2019   Procedure: PACEMAKER IMPLANT;  Surgeon: Evans Lance, MD;  Location: [facility];  Service: Cardiovascular;  Laterality: N/A;   UPPER ESOPHAGEAL ENDOSCOPIC ULTRASOUND (EUS) Bilateral 12/30/2021   Procedure: UPPER ESOPHAGEAL ENDOSCOPIC ULTRASOUND (EUS);  Surgeon: Arta Silence, MD;  Location: [facility];  Service: Gastroenterology;  Laterality: Bilateral;    FAMILY HISTORY:  Shannon Ortiz declined providing detailed family history information.  She did not report a family history of other cancer, schwannomas, heart defects, or developmental delays.    GENETIC TESTING: Genetic testing detected heterozygous pathogenic variant was detected in the LZTR1 gene called  [id number] ([id number]). There were no deleterious mutations in other genes in [facility] Panel.  The CancerNext-Expanded gene panel offered by [facility] and includes sequencing, rearrangement, and RNA analysis for the following 77 genes: AIP, ALK, APC, ATM, AXIN2, BAP1, BARD1, BLM, BMPR1A, BRCA1, BRCA2, BRIP1, CDC73, CDH1, CDK4, CDKN1B, CDKN2A, CHEK2, CTNNA1, DICER1, FANCC, FH, FLCN, GALNT12, KIF1B, LZTR1, MAX, MEN1, MET, MLH1, MSH2, MSH3,  MSH6, MUTYH, NBN, NF1, NF2, NTHL1, PALB2, PHOX2B, PMS2, POT1, PRKAR1A, PTCH1, PTEN, RAD51C, RAD51D, RB1, RECQL, RET, SDHA, SDHAF2, SDHB, SDHC, SDHD, SMAD4, SMARCA4, SMARCB1, SMARCE1, STK11, SUFU, TMEM127, TP53, TSC1, TSC2, VHL and XRCC2 (sequencing and deletion/duplication); EGFR, EGLN1, HOXB13, KIT, MITF, PDGFRA,  POLD1, and POLE (sequencing only); EPCAM and GREM1 (deletion/duplication only).    The test report will be scanned into EPIC and located under the Molecular Pathology section of the Results Review tab.  A portion of the result report is included below for reference. Report date is January 30, 2022.    Of note, this result does not explain Shannon Ortiz's personal history of pancreatic cancer. Possible explanations for the cancer in the family may include: There may be no hereditary risk for cancer in the family. The cancers in Shannon Ortiz and/or her family may be sporadic/familial or due to other genetic and environmental factors. There may be a gene mutation in one of these genes that current testing methods cannot detect but that chance is small. There could be another gene that has not yet been discovered, or that we have not yet tested, that is responsible for the cancer diagnoses in the family.   Therefore, Shannon Ortiz and family members should keep in touch with genetics in the future to ensure up-to-date testing as appropriate.   LZTR1 CLINICAL MANAGEMENT Individuals with one LZTR1 variant have an increased chance to develop schwannomatosis. Individuals with a variant on one or both copies of their LZTR1 gene also have an increased chance to develop Noonan spectrum disorder (NSD).   Schwannomatosis is associated with an increased chance to develop multiple tumors of the nerves (schwannomas) throughout the body (most often in the spine and peripheral nerves). These tumors are noncancerous (benign) but can cause pain and other symptoms due to their location and/or size. There are criteria to establish diagnosis of LZTR1-related schwannomatosis; a positive variant alone is not enough for a diagnosis.    Noonan spectrum disorders are a group of childhood developmental disorders that have common symptoms. These include cardiovascular conditions (such as a congenital heart defect or hypertrophic  cardiomyopathy), short stature, distinctive facial features, areas of different pigmentation of the skin (such as dark brown spots called lentigines and cafe au lait spots), and a variable degree of developmental delay. Individuals with NSD also have an increased risk of developing benign or cancerous tumors and hematologic abnormalities.    A child with cardiac abnormalities an differences in development, growth and appearance is suggestive of Noonan disorder. ShannonOrtiz does not report any of these features for herself.   An individual with personal or family history of schwannomas, or pain/other symptoms of schwannoma, is suggestive of schwannomatosis. Shannon Ortiz does not report any history/family history of schwannomas. Current guidelines do not recommend surveillance in LZTR1 positive individuals with no personal or family history suggestive of schwannomatosis. The risk of tumor development in these individuals is thought to be low.   FAMILY RECOMMENDATIONS First degree relatives have a 50% chance of also having the pathogenic variant in the LZTR1 gene.  Other relatives also have an increased chance of having the family LZTR1 mutation.  Genetic testing is available if they wish to learn their LZTR1 status.   Individuals in this family might be at some increased risk of developing cancer, over the general population risk, due to the family history of cancer.  Individuals in the family should notify their providers of the family history of pancreatic cancer.    PLAN/FOLLOW-UP:  At this time, no changes in surveillance or management are indicated for Shannon Ortiz based on this result.  Encouraged to discuss result with family members. Shannon Ortiz daughter, Shannon Ortiz, declined genetic testing at this time.  Written information on LZTR1 was provided to family. Shannon Ortiz and family have our contact information if questions about LZTR1/cancer genetics arise or family members wish to have genetic testing in  the future.  ShannonOrtiz's daughter, Shannon Ortiz, expressed concerns about her mother's performance status.  Dr. Ernestina Penna team contacted to follow up.    Shannon Ortiz M. Joette Catching, [place], [facility] Genetic Counselor [email or web address] (P) [phone]  Patient was seen for 10 minutes of face-to-face genetic counseling.  Ms. Murchison was accompanied by her daughter.  Her grandson attended by videoconferencing.

## 2022-03-27 ENCOUNTER — Inpatient Hospital Stay (HOSPITAL_BASED_OUTPATIENT_CLINIC_OR_DEPARTMENT_OTHER): Payer: Medicare Other | Admitting: Hematology

## 2022-03-27 ENCOUNTER — Encounter: Payer: Self-pay | Admitting: Hematology

## 2022-03-27 ENCOUNTER — Inpatient Hospital Stay: Payer: Medicare Other

## 2022-03-27 VITALS — BP 164/85 | HR 61 | Temp 98.2°F | Resp 15 | Wt 125.2 lb

## 2022-03-27 DIAGNOSIS — Z8719 Personal history of other diseases of the digestive system: Secondary | ICD-10-CM | POA: Diagnosis not present

## 2022-03-27 DIAGNOSIS — K8689 Other specified diseases of pancreas: Secondary | ICD-10-CM | POA: Diagnosis not present

## 2022-03-27 DIAGNOSIS — C250 Malignant neoplasm of head of pancreas: Secondary | ICD-10-CM

## 2022-03-27 DIAGNOSIS — R110 Nausea: Secondary | ICD-10-CM | POA: Diagnosis not present

## 2022-03-27 DIAGNOSIS — K5900 Constipation, unspecified: Secondary | ICD-10-CM | POA: Diagnosis not present

## 2022-03-27 DIAGNOSIS — Z79899 Other long term (current) drug therapy: Secondary | ICD-10-CM | POA: Diagnosis not present

## 2022-03-27 DIAGNOSIS — Z7901 Long term (current) use of anticoagulants: Secondary | ICD-10-CM | POA: Diagnosis not present

## 2022-03-27 DIAGNOSIS — Z9049 Acquired absence of other specified parts of digestive tract: Secondary | ICD-10-CM | POA: Diagnosis not present

## 2022-03-27 DIAGNOSIS — I700 Atherosclerosis of aorta: Secondary | ICD-10-CM | POA: Diagnosis not present

## 2022-03-27 DIAGNOSIS — D649 Anemia, unspecified: Secondary | ICD-10-CM | POA: Diagnosis not present

## 2022-03-27 DIAGNOSIS — I119 Hypertensive heart disease without heart failure: Secondary | ICD-10-CM | POA: Diagnosis not present

## 2022-03-27 LAB — CMP (CANCER CENTER ONLY)
ALT: 17 U/L (ref 0–44)
AST: 35 U/L (ref 15–41)
Albumin: 4 g/dL (ref 3.5–5.0)
Alkaline Phosphatase: 94 U/L (ref 38–126)
Anion gap: 5 (ref 5–15)
BUN: 12 mg/dL (ref 8–23)
CO2: 29 mmol/L (ref 22–32)
Calcium: 9.3 mg/dL (ref 8.9–10.3)
Chloride: 104 mmol/L (ref 98–111)
Creatinine: 0.95 mg/dL (ref 0.44–1.00)
GFR, Estimated: 59 mL/min — ABNORMAL LOW (ref >60)
Glucose, Bld: 154 mg/dL — ABNORMAL HIGH (ref 70–99)
Potassium: 3.2 mmol/L — ABNORMAL LOW (ref 3.5–5.1)
Sodium: 138 mmol/L (ref 135–145)
Total Bilirubin: 0.5 mg/dL (ref 0.3–1.2)
Total Protein: 7 g/dL (ref 6.5–8.1)

## 2022-03-27 LAB — CBC WITH DIFFERENTIAL (CANCER CENTER ONLY)
Abs Immature Granulocytes: 0.01 10*3/uL (ref 0.00–0.07)
Basophils Absolute: 0 10*3/uL (ref 0.0–0.1)
Basophils Relative: 1 %
Eosinophils Absolute: 0.1 10*3/uL (ref 0.0–0.5)
Eosinophils Relative: 2 %
HCT: 28.4 % — ABNORMAL LOW (ref 36.0–46.0)
Hemoglobin: 9.8 g/dL — ABNORMAL LOW (ref 12.0–15.0)
Immature Granulocytes: 0 %
Lymphocytes Relative: 43 %
Lymphs Abs: 1.2 10*3/uL (ref 0.7–4.0)
MCH: 24 pg — ABNORMAL LOW (ref 26.0–34.0)
MCHC: 34.5 g/dL (ref 30.0–36.0)
MCV: 69.6 fL — ABNORMAL LOW (ref 80.0–100.0)
Monocytes Absolute: 0.4 10*3/uL (ref 0.1–1.0)
Monocytes Relative: 13 %
Neutro Abs: 1.2 10*3/uL — ABNORMAL LOW (ref 1.7–7.7)
Neutrophils Relative %: 41 %
Platelet Count: 185 10*3/uL (ref 150–400)
RBC: 4.08 MIL/uL (ref 3.87–5.11)
RDW: 17.5 % — ABNORMAL HIGH (ref 11.5–15.5)
WBC Count: 2.9 10*3/uL — ABNORMAL LOW (ref 4.0–10.5)
nRBC: 0 % (ref 0.0–0.2)

## 2022-03-27 NOTE — Progress Notes (Signed)
Shannon Ortiz   [phone] Fax:([phone]) [phone]   Clinic Follow up Note   Patient Care Team: Lawerance Cruel, MD as PCP - General (Family Medicine) Josue Hector, MD as PCP - Cardiology (Cardiology) Evans Lance, MD as PCP - Electrophysiology (Cardiology) Michael Boston, MD as Consulting Physician (General Surgery) Ronnette Juniper, MD as Consulting Physician (Gastroenterology) Cordelia Poche as Physician Assistant (Hematology and Oncology) Truitt Merle, MD as Consulting Physician (Oncology)  Date of Service:  03/27/2022  CHIEF COMPLAINT: f/u of pancreatic cancer  CURRENT THERAPY:  Surveillance  ASSESSMENT & PLAN:  Shannon Ortiz is a 86 y.o. female with   1. Pancreatic Cancer, stage IB, T3/4 N0 by EUS, unresectable -presented to ED 12/15/21 with constipation, decreased appetite with weight loss, and weakness. CT AP showed pancreatic and biliary ductal dilation and soft tissue at pancreatic head. -EUS with biopsy on 12/30/21 by Dr. Paulita Fujita, showed 3 cm pancreatic head mass, staged T3 (T4 if SMA invasion) N0. Cytology confirmed adenocarcinoma. -pt and family declined staging chest CT because pt was not sure if she wanted cancer treatment  -not a candidate for surgery given vascular involvement. -she declined chemo and opted to proceed with SBRT treatment under Dr. Lisbeth Renshaw 02/23/22 - 03/05/22. She tolerated well with only mild nausea. -we discussed home care, and they are interested in at least enrolling in home physical therapy. I also reviewed palliative care further down the road. -we will plan for CT AP to be done in beginning of 06/2022.    2. Symptom Management: Anemia, Low appetite -secondary to #1 -I encouraged increase to two Ensure a day. -she met dietician on 01/26/22   3. Genetics -we drew genetic panel 01/09/22. 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 -she met with genetic counselor [name] on  03/26/22 to discuss the impact of LZTR1 gene on risk of schwannomas and association with Noonan syndrome.     PLAN: -lab today -lab and CT AP in beginning of Jan 2024 with f/u several days later or following week   No problem-specific Assessment & Plan notes found for this encounter.   SUMMARY OF ONCOLOGIC HISTORY: Oncology History Overview Note   Cancer Staging  Pancreatic cancer [facility]) Staging form: Exocrine Pancreas, AJCC 8th Edition - Clinical stage from 12/30/2021: Stage IB (cT2, cN0, cM0) - Signed by Truitt Merle, MD on 01/09/2022    Pancreatic cancer ([place])  12/15/2021 Imaging   CLINICAL DATA:  Abdominal pain   EXAM: CT ABDOMEN AND PELVIS WITH CONTRAST  IMPRESSION: 1. Marked main pancreatic ductal dilation and mild biliary ductal dilation with ill-defined soft tissue at the pancreatic head, findings are concerning for pancreatic malignancy. Recommend further evaluation with EUS/MRCP. 2. Nonspecific small solid nodule of the left lower lobe. Recommend attention on follow-up. 3. Cardiomegaly and aortic Atherosclerosis (ICD10-I70.0).   12/30/2021 Procedure   Upper EUS, Dr. Paulita Fujita  Impression: - A mass was identified in the pancreatic head. This was staged T3 (T4 if SMA invasion) N0 Mx by endosonographic criteria. Fine needle aspiration performed. - There was no evidence of significant pathology in the left lobe of the liver.   12/30/2021 Initial Biopsy   A. PANCREAS, HEAD OF PANCREAS, FINE NEEDLE  ASPIRATION:   FINAL MICROSCOPIC DIAGNOSIS:  - Malignant cells consistent with adenocarcinoma   12/30/2021 Cancer Staging   Staging form: Exocrine Pancreas, AJCC 8th Edition - Clinical stage from 12/30/2021: Stage IB (cT2, cN0, cM0) -  Signed by Truitt Merle, MD on 01/09/2022 Stage prefix: Initial diagnosis Total positive nodes: 0   01/09/2022 Initial Diagnosis   Pancreatic cancer [facility]    Genetic Testing   [facility] CancerNext-Expanded Panel was Positive. A single pathogenic variant  was identified in the LZTR1 gene ([id number]). Report date is 01/30/2022.  The CancerNext-Expanded gene panel offered by [facility] and includes sequencing, rearrangement, and RNA analysis for the following 77 genes: AIP, ALK, APC, ATM, AXIN2, BAP1, BARD1, BLM, BMPR1A, BRCA1, BRCA2, BRIP1, CDC73, CDH1, CDK4, CDKN1B, CDKN2A, CHEK2, CTNNA1, DICER1, FANCC, FH, FLCN, GALNT12, KIF1B, LZTR1, MAX, MEN1, MET, MLH1, MSH2, MSH3, MSH6, MUTYH, NBN, NF1, NF2, NTHL1, PALB2, PHOX2B, PMS2, POT1, PRKAR1A, PTCH1, PTEN, RAD51C, RAD51D, RB1, RECQL, RET, SDHA, SDHAF2, SDHB, SDHC, SDHD, SMAD4, SMARCA4, SMARCB1, SMARCE1, STK11, SUFU, TMEM127, TP53, TSC1, TSC2, VHL and XRCC2 (sequencing and deletion/duplication); EGFR, EGLN1, HOXB13, KIT, MITF, PDGFRA, POLD1, and POLE (sequencing only); EPCAM and GREM1 (deletion/duplication only).       INTERVAL HISTORY:  Shannon Ortiz is here for a follow up of pancreatic cancer. She was last seen by me on 01/14/22. She presents to the clinic accompanied by her daughter with two grandsons on video chat. She reports she did well with radiation and experienced only mild nausea.   All other systems were reviewed with the patient and are negative.  MEDICAL HISTORY:  Past Medical History:  Diagnosis Date   Acquired diverticulum of distal esophagus with dysphagia 10/03/2018   AKI (acute kidney injury) ([place]) 08/31/2018   Anxiety    Bradycardia 02/16/2014   Chest tightness    DDD (degenerative disc disease), lumbar    DJD (degenerative joint disease)    Dry eyes    Esophageal stenosis 08/31/2018   Facial pain    Fibromyalgia    Hemorrhoid    Hypertension    Hypokalemia 08/31/2018   Hypothyroidism    Lightheadedness    Near syncope 02/16/2014   Osteopenia    Sigmoid diverticulosis    Thyroid nodule    PT DENIES    SURGICAL HISTORY: Past Surgical History:  Procedure Laterality Date   ABDOMINAL HYSTERECTOMY     COMPLETE   BALLOON DILATION N/A 09/01/2018   Procedure:  BALLOON DILATION;  Surgeon: Ronnette Juniper, MD;  Location: [place];  Service: Gastroenterology;  Laterality: N/A;   BIOPSY  09/01/2018   Procedure: BIOPSY;  Surgeon: Ronnette Juniper, MD;  Location: [facility] ENDOSCOPY;  Service: Gastroenterology;;   BIOPSY  01/26/2022   Procedure: BIOPSY;  Surgeon: Irving Copas., MD;  Location: [facility]OSCOPY;  Service: Gastroenterology;;   BOTOX INJECTION  09/01/2018   Procedure: BOTOX INJECTION;  Surgeon: Ronnette Juniper, MD;  Location: [facility] ENDOSCOPY;  Service: Gastroenterology;;   BREAST SURGERY     breast biopsy   CHOLECYSTECTOMY     ESOPHAGEAL MANOMETRY N/A 10/07/2018   Procedure: ESOPHAGEAL MANOMETRY (EM);  Surgeon: Ronnette Juniper, MD;  Location: [facility] ENDOSCOPY;  Service: Gastroenterology;  Laterality: N/A;   ESOPHAGOGASTRODUODENOSCOPY N/A 10/25/2018   Procedure: ESOPHAGOGASTRODUODENOSCOPY (EGD);  Surgeon: Michael Boston, MD;  Location: [facility];  Service: General;  Laterality: N/A;   ESOPHAGOGASTRODUODENOSCOPY N/A 01/26/2022   Procedure: ESOPHAGOGASTRODUODENOSCOPY (EGD);  Surgeon: Irving Copas., MD;  Location: [facility];  Service: Gastroenterology;  Laterality: N/A;   ESOPHAGOGASTRODUODENOSCOPY (EGD) WITH PROPOFOL N/A 09/01/2018   Procedure: ESOPHAGOGASTRODUODENOSCOPY (EGD) WITH PROPOFOL;  Surgeon: Ronnette Juniper, MD;  Location: [place];  Service: Gastroenterology;  Laterality: N/A;   ESOPHAGOGASTRODUODENOSCOPY (EGD) WITH PROPOFOL N/A 12/30/2021   Procedure: ESOPHAGOGASTRODUODENOSCOPY (EGD) WITH PROPOFOL;  Surgeon: Arta Silence, MD;  Location: [facility];  Service: Gastroenterology;  Laterality: N/A;   EUS N/A 01/26/2022   Procedure: UPPER ENDOSCOPIC ULTRASOUND (EUS) RADIAL;  Surgeon: Irving Copas., MD;  Location: [facility] ENDOSCOPY;  Service: Gastroenterology;  Laterality: N/A;   FIDUCIAL MARKER PLACEMENT N/A 01/26/2022   Procedure: FIDUCIAL MARKER PLACEMENT;  Surgeon: Irving Copas., MD;  Location: [facility] ENDOSCOPY;  Service: Gastroenterology;   Laterality: N/A;   FINE NEEDLE ASPIRATION N/A 12/30/2021   Procedure: FINE NEEDLE ASPIRATION (FNA) LINEAR;  Surgeon: Arta Silence, MD;  Location: [facility] ENDOSCOPY;  Service: Gastroenterology;  Laterality: N/A;   FOREIGN BODY REMOVAL  01/26/2022   Procedure: FOREIGN BODY REMOVAL;  Surgeon: Rush Landmark Telford Nab., MD;  Location: [facility]OS[facility];  Service: Gastroenterology;;   PACEMAKER IMPLANT N/A 03/06/2019   Procedure: PACEMAKER IMPLANT;  Surgeon: Evans Lance, MD;  Location: [place] CV LAB;  Service: Cardiovascular;  Laterality: N/A;   UPPER ESOPHAGEAL ENDOSCOPIC ULTRASOUND (EUS) Bilateral 12/30/2021   Procedure: UPPER ESOPHAGEAL ENDOSCOPIC ULTRASOUND (EUS);  Surgeon: Arta Silence, MD;  Location: [facility]OS[facility];  Service: Gastroenterology;  Laterality: Bilateral;    I have reviewed the social history and family history with the patient and they are unchanged from previous note.  ALLERGIES:  is allergic to ace inhibitors, elavil [amitriptyline hcl], and ketek [telithromycin].  MEDICATIONS:  Current Outpatient Medications  Medication Sig Dispense Refill   amLODipine (NORVASC) 10 MG tablet Take 1 tablet (10 mg total) by mouth daily. 90 tablet 3   apixaban (ELIQUIS) 5 MG TABS tablet Take 1 tablet (5 mg total) by mouth 2 (two) times daily. 60 tablet 10   docusate sodium (COLACE) 100 MG capsule Take 100 mg by mouth 2 (two) times daily.     feeding supplement (ENSURE SURGERY) LIQD Take 237 mLs by mouth 2 (two) times daily between meals. (Patient taking differently: Take 237 mLs by mouth 3 (three) times a week.) 60 Bottle 0   losartan (COZAAR) 100 MG tablet Take 100 mg by mouth in the morning.     metoprolol succinate (TOPROL-XL) 50 MG 24 hr tablet Take 50 mg by mouth in the morning.     ondansetron (ZOFRAN-ODT) 4 MG disintegrating tablet Take 4 mg by mouth every 8 (eight) hours as needed for nausea.     pantoprazole (PROTONIX) 40 MG tablet Take 1 tablet (40 mg total) by mouth daily. 30 tablet 6    polyethylene glycol (MIRALAX) 17 g packet Take 17 g by mouth daily. (Patient not taking: Reported on 01/21/2022) 30 each 3   No current facility-administered medications for this visit.    PHYSICAL EXAMINATION: ECOG PERFORMANCE STATUS: 1 - Symptomatic but completely ambulatory  Vitals:   03/27/22 1416  BP: (!) 164/85  Pulse: 61  Resp: 15  Temp: 98.2 F (36.8 C)  SpO2: 100%   Wt Readings from Last 3 Encounters:  03/27/22 125 lb 3.2 oz (56.8 kg)  01/26/22 126 lb 15.8 oz (57.6 kg)  01/20/22 127 lb 2 oz (57.7 kg)     GENERAL:alert, no distress and comfortable SKIN: skin color normal, no rashes or significant lesions EYES: normal, Conjunctiva are pink and non-injected, sclera clear  NEURO: alert & oriented x 3 with fluent speech  LABORATORY DATA:  I have reviewed the data as listed    Latest Ref Rng & Units 03/27/2022    3:08 PM 01/09/2022    2:53 PM 12/26/2021    1:21 PM  CBC  WBC 4.0 - 10.5 K/uL 2.9  3.4  5.0   Hemoglobin 12.0 - 15.0 g/dL 9.8  10.2  10.3   Hematocrit 36.0 - 46.0 % 28.4  30.8  31.5   Platelets 150 - 400 K/uL 185  253  200         Latest Ref Rng & Units 03/27/2022    3:08 PM 01/09/2022    2:53 PM 12/26/2021    1:21 PM  CMP  Glucose 70 - 99 mg/dL 154  125  113   BUN 8 - 23 mg/dL _0 Creatinine 0.44 - 1.00 mg/dL 0.95  0.81  0.79   Sodium 135 - 145 mmol/L 138  139  138   Potassium 3.5 - 5.1 mmol/L 3.2  3.5  3.4   Chloride 98 - 111 mmol/L 104  106  104   CO2 22 - 32 mmol/L _1 Calcium 8.9 - 10.3 mg/dL 9.3  9.7  10.1   Total Protein 6.5 - 8.1 g/dL 7.0  7.2  7.5   Total Bilirubin 0.3 - 1.2 mg/dL 0.5  0.5  0.5   Alkaline Phos 38 - 126 U/L 94  52  61   AST 15 - 41 U/L 35  16  16   ALT 0 - 44 U/L _2 RADIOGRAPHIC STUDIES: I have personally reviewed the radiological images as listed and agreed with the findings in the report. No results found.    Orders Placed This Encounter  Procedures   CT ABDOMEN PELVIS W CONTRAST     Standing Status:   Future    Standing Expiration Date:   03/28/2023    Order Specific Question:   If indicated for the ordered procedure, I authorize the administration of contrast media per Radiology protocol    Answer:   Yes    Order Specific Question:   Preferred imaging location?    Answer:   [facility] ([place])    Order Specific Question:   Release to patient    Answer:   Immediate    Order Specific Question:   Is Oral Contrast requested for this exam?    Answer:   Yes, Per Radiology protocol   All questions were answered. The patient knows to call the clinic with any problems, questions or concerns. No barriers to learning was detected. The total time spent in the appointment was 30 minutes.     Truitt Merle, MD 03/27/2022   I, Wilburn Mylar, am acting as scribe for Truitt Merle, MD.   I have reviewed the above documentation for accuracy and completeness, and I agree with the above.

## 2022-03-28 LAB — CANCER ANTIGEN 19-9: CA 19-9: 479 U/mL — ABNORMAL HIGH (ref 0–35)

## 2022-03-30 ENCOUNTER — Other Ambulatory Visit: Payer: Self-pay

## 2022-03-30 ENCOUNTER — Telehealth: Payer: Self-pay

## 2022-03-30 ENCOUNTER — Other Ambulatory Visit (HOSPITAL_COMMUNITY): Payer: Self-pay

## 2022-03-30 MED ORDER — POTASSIUM CHLORIDE 20 MEQ PO PACK
20.0000 meq | PACK | Freq: Two times a day (BID) | ORAL | 0 refills | Status: DC
  Filled 2022-03-30: qty 14, 7d supply, fill #0

## 2022-03-30 NOTE — Telephone Encounter (Signed)
Spoke with pt's daughter and pt via telephone regarding labs.  Informed them that pt's K+ is low and Dr. Burr Medico would like for the pt to take oral K+ for 7 days.  Pt's daughter requested if prescription could be called into [facility].  Pt and daughter had no further questions or concerns at this time.

## 2022-03-31 ENCOUNTER — Other Ambulatory Visit (HOSPITAL_COMMUNITY): Payer: Self-pay

## 2022-04-13 ENCOUNTER — Ambulatory Visit
Admission: RE | Admit: 2022-04-13 | Discharge: 2022-04-13 | Disposition: A | Discharge status: Home / Self Care | Payer: Medicare Other | Source: Ambulatory Visit | Attending: Hematology | Admitting: Hematology

## 2022-04-13 NOTE — Progress Notes (Addendum)
  Radiation Oncology         [phone] ________________________________  Name: Shannon Ortiz MRN: [id number]  Date of Service: 04/13/2022  DOB: [birth year]  Post Treatment Telephone Note  Diagnosis: Pancreatic cancer      Indication for treatment: curative        Radiation treatment dates: 02/23/22-03/05/22 (as documented in provider EOT note)   The patient was available for call today.   Symptoms of fatigue have improved since completing therapy.  Symptoms of skin changes have  improved since completing therapy.  Symptoms of nausea or vomiting have  improved since completing therapy.   The patient has scheduled follow up with her medical oncologist Dr. Burr Medico for ongoing surveillance, and was encouraged to call if she develop concerns or questions regarding radiation.   This concludes the nursing interview.  Leandra Kern, LPN

## 2022-04-20 DIAGNOSIS — R7301 Impaired fasting glucose: Secondary | ICD-10-CM | POA: Diagnosis not present

## 2022-04-20 DIAGNOSIS — E7800 Pure hypercholesterolemia, unspecified: Secondary | ICD-10-CM | POA: Diagnosis not present

## 2022-04-20 DIAGNOSIS — I10 Essential (primary) hypertension: Secondary | ICD-10-CM | POA: Diagnosis not present

## 2022-04-20 DIAGNOSIS — D72821 Monocytosis (symptomatic): Secondary | ICD-10-CM | POA: Diagnosis not present

## 2022-04-22 DIAGNOSIS — I10 Essential (primary) hypertension: Secondary | ICD-10-CM | POA: Diagnosis not present

## 2022-04-22 DIAGNOSIS — Z0000 Encounter for general adult medical examination without abnormal findings: Secondary | ICD-10-CM | POA: Diagnosis not present

## 2022-04-22 DIAGNOSIS — R7301 Impaired fasting glucose: Secondary | ICD-10-CM | POA: Diagnosis not present

## 2022-04-22 DIAGNOSIS — I480 Paroxysmal atrial fibrillation: Secondary | ICD-10-CM | POA: Diagnosis not present

## 2022-04-22 DIAGNOSIS — D6869 Other thrombophilia: Secondary | ICD-10-CM | POA: Diagnosis not present

## 2022-04-22 DIAGNOSIS — Z6821 Body mass index (BMI) 21.0-21.9, adult: Secondary | ICD-10-CM | POA: Diagnosis not present

## 2022-04-22 DIAGNOSIS — E7800 Pure hypercholesterolemia, unspecified: Secondary | ICD-10-CM | POA: Diagnosis not present

## 2022-04-22 DIAGNOSIS — D649 Anemia, unspecified: Secondary | ICD-10-CM | POA: Diagnosis not present

## 2022-05-01 ENCOUNTER — Telehealth: Payer: Self-pay | Admitting: Hematology

## 2022-05-01 ENCOUNTER — Other Ambulatory Visit: Payer: Self-pay

## 2022-05-01 ENCOUNTER — Ambulatory Visit (INDEPENDENT_AMBULATORY_CARE_PROVIDER_SITE_OTHER): Payer: Medicare Other

## 2022-05-01 ENCOUNTER — Inpatient Hospital Stay: Payer: Medicare Other | Admitting: Hematology

## 2022-05-01 ENCOUNTER — Inpatient Hospital Stay: Payer: Medicare Other

## 2022-05-01 DIAGNOSIS — I495 Sick sinus syndrome: Secondary | ICD-10-CM | POA: Diagnosis not present

## 2022-05-01 DIAGNOSIS — C250 Malignant neoplasm of head of pancreas: Secondary | ICD-10-CM

## 2022-05-01 LAB — CUP PACEART REMOTE DEVICE CHECK
Battery Remaining Longevity: 72 mo
Battery Remaining Percentage: 66 %
Battery Voltage: 2.99 V
Brady Statistic AP VP Percent: 1.7 %
Brady Statistic AP VS Percent: 92 %
Brady Statistic AS VP Percent: 1 %
Brady Statistic AS VS Percent: 2.3 %
Brady Statistic RA Percent Paced: 86 %
Brady Statistic RV Percent Paced: 3.7 %
Date Time Interrogation Session: 20231117062058
Implantable Lead Connection Status: 753985
Implantable Lead Connection Status: 753985
Implantable Lead Implant Date: 20200921
Implantable Lead Implant Date: 20200921
Implantable Lead Location: 753859
Implantable Lead Location: 753860
Implantable Pulse Generator Implant Date: 20200921
Lead Channel Impedance Value: 390 Ohm
Lead Channel Impedance Value: 600 Ohm
Lead Channel Pacing Threshold Amplitude: 1 V
Lead Channel Pacing Threshold Amplitude: 1.5 V
Lead Channel Pacing Threshold Pulse Width: 0.5 ms
Lead Channel Pacing Threshold Pulse Width: 0.5 ms
Lead Channel Sensing Intrinsic Amplitude: 12 mV
Lead Channel Sensing Intrinsic Amplitude: 2.1 mV
Lead Channel Setting Pacing Amplitude: 1.25 V
Lead Channel Setting Pacing Amplitude: 2.5 V
Lead Channel Setting Pacing Pulse Width: 0.5 ms
Lead Channel Setting Sensing Sensitivity: 2 mV
Pulse Gen Model: 2272
Pulse Gen Serial Number: 9162202

## 2022-05-01 NOTE — Telephone Encounter (Signed)
R/s per 11/17 in basket, left message about appt change

## 2022-05-08 ENCOUNTER — Ambulatory Visit (HOSPITAL_COMMUNITY): Payer: Medicare Other

## 2022-05-14 NOTE — Progress Notes (Signed)
[place]   [phone] [phone]   Clinic Follow up Note   Patient Care Team: Lawerance Cruel, MD as PCP - General (Family Medicine) Josue Hector, MD as PCP - Cardiology (Cardiology) Evans Lance, MD as PCP - Electrophysiology (Cardiology) Michael Boston, MD as Consulting Physician (General Surgery) Ronnette Juniper, MD as Consulting Physician (Gastroenterology) Cordelia Poche as Physician Assistant (Hematology and Oncology) Truitt Merle, MD as Consulting Physician (Oncology)  Date of Service:  05/15/2022  CHIEF COMPLAINT: f/u of  pancreatic cancer   CURRENT THERAPY:  Surveillance   ASSESSMENT:  Shannon Ortiz is a 86 y.o. female with   Pancreatic cancer ([place]) -stage IB, T3/4 N0 by EUS, unresectable  -diagnosed in 12/2021 --pt and family declined staging chest CT because pt was not sure if she wanted cancer treatment   -she declined chemo and opted to proceed with SBRT treatment under Dr. Lisbeth Renshaw 02/23/22 - 03/05/22. She tolerated well with only mild nausea.   Genetic testing --we drew genetic panel 01/09/22. 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 -she met with genetic counselor [name] on 03/26/22 to discuss the impact of LZTR1 gene on risk of schwannomas and association with Noonan syndrome.     PLAN: -phone visit on 05/27/2022 to reviewed scan which is scheduled for 12/8  -placed Physical Therapy/ Referral    SUMMARY OF ONCOLOGIC HISTORY: Oncology History Overview Note   Cancer Staging  Pancreatic cancer [facility]) Staging form: Exocrine Pancreas, AJCC 8th Edition - Clinical stage from 12/30/2021: Stage IB (cT2, cN0, cM0) - Signed by Truitt Merle, MD on 01/09/2022    Pancreatic cancer ([place])  12/15/2021 Imaging   CLINICAL DATA:  Abdominal pain   EXAM: CT ABDOMEN AND PELVIS WITH CONTRAST  IMPRESSION: 1. Marked main pancreatic ductal dilation and mild biliary ductal dilation  with ill-defined soft tissue at the pancreatic head, findings are concerning for pancreatic malignancy. Recommend further evaluation with EUS/MRCP. 2. Nonspecific small solid nodule of the left lower lobe. Recommend attention on follow-up. 3. Cardiomegaly and aortic Atherosclerosis (ICD10-I70.0).   12/30/2021 Procedure   Upper EUS, Dr. Paulita Fujita  Impression: - A mass was identified in the pancreatic head. This was staged T3 (T4 if SMA invasion) N0 Mx by endosonographic criteria. Fine needle aspiration performed. - There was no evidence of significant pathology in the left lobe of the liver.   12/30/2021 Initial Biopsy   A. PANCREAS, HEAD OF PANCREAS, FINE NEEDLE  ASPIRATION:   FINAL MICROSCOPIC DIAGNOSIS:  - Malignant cells consistent with adenocarcinoma   12/30/2021 Cancer Staging   Staging form: Exocrine Pancreas, AJCC 8th Edition - Clinical stage from 12/30/2021: Stage IB (cT2, cN0, cM0) - Signed by Truitt Merle, MD on 01/09/2022 Stage prefix: Initial diagnosis Total positive nodes: 0   01/09/2022 Initial Diagnosis   Pancreatic cancer [facility])    Genetic Testing   [facility] CancerNext-Expanded Panel was Positive. 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 Report date is 01/30/2022.  The CancerNext-Expanded gene panel offered by [facility] and includes sequencing, rearrangement, and RNA analysis for the following 77 genes: AIP, ALK, APC, ATM, AXIN2, BAP1, BARD1, BLM, BMPR1A, BRCA1, BRCA2, BRIP1, CDC73, CDH1, CDK4, CDKN1B, CDKN2A, CHEK2, CTNNA1, DICER1, FANCC, FH, FLCN, GALNT12, KIF1B, LZTR1, MAX, MEN1, MET, MLH1, MSH2, MSH3, MSH6, MUTYH, NBN, NF1, NF2, NTHL1, PALB2, PHOX2B, PMS2, POT1, PRKAR1A, PTCH1, PTEN, RAD51C, RAD51D, RB1, RECQL, RET, SDHA, SDHAF2, SDHB, SDHC, SDHD, SMAD4,  SMARCA4, SMARCB1, SMARCE1, STK11, SUFU, TMEM127, TP53, TSC1, TSC2, VHL and XRCC2 (sequencing and deletion/duplication); EGFR, EGLN1, HOXB13, KIT, MITF, PDGFRA, POLD1, and POLE  (sequencing only); EPCAM and GREM1 (deletion/duplication only).        INTERVAL HISTORY:  Shannon Ortiz is here for a follow up of pancreatic cancer  She was last seen by me on 03/27/2022 She presents to the clinic accompanied by grandson/ daughter on phone.Pt states everything is the same.Pt stated she feels better since radiation an little fatigue. She denies having pain but have nausea. She said her bowel was a little hard this week.She denise blood in stool.  All other systems were reviewed with the patient and are negative.  MEDICAL HISTORY:  Past Medical History:  Diagnosis Date   Acquired diverticulum of distal esophagus with dysphagia 10/03/2018   AKI (acute kidney injury) ([place]) 08/31/2018   Anxiety    Bradycardia 02/16/2014   Chest tightness    DDD (degenerative disc disease), lumbar    DJD (degenerative joint disease)    Dry eyes    Esophageal stenosis 08/31/2018   Facial pain    Fibromyalgia    Hemorrhoid    Hypertension    Hypokalemia 08/31/2018   Hypothyroidism    Lightheadedness    Near syncope 02/16/2014   Osteopenia    Sigmoid diverticulosis    Thyroid nodule    PT DENIES    SURGICAL HISTORY: Past Surgical History:  Procedure Laterality Date   ABDOMINAL HYSTERECTOMY     COMPLETE   BALLOON DILATION N/A 09/01/2018   Procedure: BALLOON DILATION;  Surgeon: Ronnette Juniper, MD;  Location: [place];  Service: Gastroenterology;  Laterality: N/A;   BIOPSY  09/01/2018   Procedure: BIOPSY;  Surgeon: Ronnette Juniper, MD;  Location: [facility] ENDOSCOPY;  Service: Gastroenterology;;   BIOPSY  01/26/2022   Procedure: BIOPSY;  Surgeon: Irving Copas., MD;  Location: [facility];  Service: Gastroenterology;;   BOTOX INJECTION  09/01/2018   Procedure: BOTOX INJECTION;  Surgeon: Ronnette Juniper, MD;  Location: [facility] ENDOSCOPY;  Service: Gastroenterology;;   BREAST SURGERY     breast biopsy   CHOLECYSTECTOMY     ESOPHAGEAL MANOMETRY N/A 10/07/2018   Procedure: ESOPHAGEAL MANOMETRY (EM);   Surgeon: Ronnette Juniper, MD;  Location: [facility] ENDOSCOPY;  Service: Gastroenterology;  Laterality: N/A;   ESOPHAGOGASTRODUODENOSCOPY N/A 10/25/2018   Procedure: ESOPHAGOGASTRODUODENOSCOPY (EGD);  Surgeon: Michael Boston, MD;  Location: [facility];  Service: General;  Laterality: N/A;   ESOPHAGOGASTRODUODENOSCOPY N/A 01/26/2022   Procedure: ESOPHAGOGASTRODUODENOSCOPY (EGD);  Surgeon: Irving Copas., MD;  Location: [facility];  Service: Gastroenterology;  Laterality: N/A;   ESOPHAGOGASTRODUODENOSCOPY (EGD) WITH PROPOFOL N/A 09/01/2018   Procedure: ESOPHAGOGASTRODUODENOSCOPY (EGD) WITH PROPOFOL;  Surgeon: Ronnette Juniper, MD;  Location: [place];  Service: Gastroenterology;  Laterality: N/A;   ESOPHAGOGASTRODUODENOSCOPY (EGD) WITH PROPOFOL N/A 12/30/2021   Procedure: ESOPHAGOGASTRODUODENOSCOPY (EGD) WITH PROPOFOL;  Surgeon: Arta Silence, MD;  Location: [facility];  Service: Gastroenterology;  Laterality: N/A;   EUS N/A 01/26/2022   Procedure: UPPER ENDOSCOPIC ULTRASOUND (EUS) RADIAL;  Surgeon: Irving Copas., MD;  Location: [facility];  Service: Gastroenterology;  Laterality: N/A;   FIDUCIAL MARKER PLACEMENT N/A 01/26/2022   Procedure: FIDUCIAL MARKER PLACEMENT;  Surgeon: Irving Copas., MD;  Location: [facility];  Service: Gastroenterology;  Laterality: N/A;   FINE NEEDLE ASPIRATION N/A 12/30/2021   Procedure: FINE NEEDLE ASPIRATION (FNA) LINEAR;  Surgeon: Arta Silence, MD;  Location: [facility];  Service: Gastroenterology;  Laterality: N/A;   FOREIGN BODY REMOVAL  01/26/2022   Procedure: FOREIGN BODY REMOVAL;  Surgeon: Rush Landmark Telford Nab., MD;  Location: [facility];  Service: Gastroenterology;;   PACEMAKER IMPLANT N/A 03/06/2019   Procedure: PACEMAKER IMPLANT;  Surgeon: Evans Lance, MD;  Location: [place] CV LAB;  Service: Cardiovascular;  Laterality: N/A;   UPPER ESOPHAGEAL ENDOSCOPIC ULTRASOUND (EUS) Bilateral 12/30/2021   Procedure: UPPER ESOPHAGEAL ENDOSCOPIC  ULTRASOUND (EUS);  Surgeon: Arta Silence, MD;  Location: [facility];  Service: Gastroenterology;  Laterality: Bilateral;    I have reviewed the social history and family history with the patient and they are unchanged from previous note.  ALLERGIES:  is allergic to ace inhibitors, elavil [amitriptyline hcl], and ketek [telithromycin].  MEDICATIONS:  Current Outpatient Medications  Medication Sig Dispense Refill   amLODipine (NORVASC) 10 MG tablet Take 1 tablet (10 mg total) by mouth daily. 90 tablet 3   apixaban (ELIQUIS) 5 MG TABS tablet Take 1 tablet (5 mg total) by mouth 2 (two) times daily. 60 tablet 10   docusate sodium (COLACE) 100 MG capsule Take 100 mg by mouth 2 (two) times daily.     feeding supplement (ENSURE SURGERY) LIQD Take 237 mLs by mouth 2 (two) times daily between meals. (Patient taking differently: Take 237 mLs by mouth 3 (three) times a week.) 60 Bottle 0   losartan (COZAAR) 100 MG tablet Take 100 mg by mouth in the morning.     metoprolol succinate (TOPROL-XL) 50 MG 24 hr tablet Take 50 mg by mouth in the morning.     ondansetron (ZOFRAN-ODT) 4 MG disintegrating tablet Take 4 mg by mouth every 8 (eight) hours as needed for nausea.     pantoprazole (PROTONIX) 40 MG tablet Take 1 tablet (40 mg total) by mouth daily. 30 tablet 6   polyethylene glycol (MIRALAX) 17 g packet Take 17 g by mouth daily. (Patient not taking: Reported on 01/21/2022) 30 each 3   potassium chloride (KLOR-CON) 20 MEQ packet Dissolve 1 packet (20 mEq) in water and take by mouth 2 (two) times daily as directed. 14 packet 0   No current facility-administered medications for this visit.    PHYSICAL EXAMINATION: ECOG PERFORMANCE STATUS: 1 - Symptomatic but completely ambulatory  Vitals:   05/15/22 1050  BP: (!) 175/105  Pulse: 67  Resp: 16  Temp: 98.3 F (36.8 C)  SpO2: 100%   Wt Readings from Last 3 Encounters:  05/15/22 120 lb 4.8 oz (54.6 kg)  03/27/22 125 lb 3.2 oz (56.8 kg)  01/26/22  126 lb 15.8 oz (57.6 kg)    GENERAL:alert, no distress and comfortable SKIN: skin color, texture, turgor are normal, no rashes or significant lesions LYMPH:  no palpable lymphadenopathy in the cervical, axillary  LUNGS: clear to auscultation and percussion with normal breathing effort HEART: regular rate & rhythm and no murmurs and no lower extremity edema ABDOMEN:abdomen soft, non-tender and normal bowel sounds(-) Musculoskeletal:no cyanosis of digits and no clubbing  NEURO: alert & oriented x 3 with fluent speech, no focal motor/sensory deficits  LABORATORY DATA:  I have reviewed the data as listed    Latest Ref Rng & Units 05/15/2022   10:28 AM 03/27/2022    3:08 PM 01/09/2022    2:53 PM  CBC  WBC 4.0 - 10.5 K/uL 2.3  2.9  3.4   Hemoglobin 12.0 - 15.0 g/dL 10.3  9.8  10.2   Hematocrit 36.0 - 46.0 % 30.6  28.4  30.8   Platelets 150 - 400 K/uL 176  185  253         Latest Ref Rng & Units 05/15/2022   10:28 AM 03/27/2022    3:08 PM 01/09/2022    2:53 PM  CMP  Glucose 70 - 99 mg/dL 136  154  125   BUN 8 - 23 mg/dL _0 Creatinine 0.44 - 1.00 mg/dL 0.76  0.95  0.81   Sodium 135 - 145 mmol/L 138  138  139   Potassium 3.5 - 5.1 mmol/L 2.8  3.2  3.5   Chloride 98 - 111 mmol/L 100  104  106   CO2 22 - 32 mmol/L _1 Calcium 8.9 - 10.3 mg/dL 9.6  9.3  9.7   Total Protein 6.5 - 8.1 g/dL 7.3  7.0  7.2   Total Bilirubin 0.3 - 1.2 mg/dL 0.5  0.5  0.5   Alkaline Phos 38 - 126 U/L 86  94  52   AST 15 - 41 U/L 23  35  16   ALT 0 - 44 U/L _2 RADIOGRAPHIC STUDIES: I have personally reviewed the radiological images as listed and agreed with the findings in the report. No results found.    Orders Placed This Encounter  Procedures   Ambulatory referral to Physical Therapy    Referral Priority:   Routine    Referral Type:   Physical Medicine    Referral Reason:   Specialty Services Required    Requested Specialty:   Physical Therapy    Number of Visits  Requested:   1   All questions were answered. The patient knows to call the clinic with any problems, questions or concerns. No barriers to learning was detected. The total time spent in the appointment was 30 minutes.     Truitt Merle, MD 05/15/2022   Felicity Coyer, CMA, am acting as scribe for Truitt Merle, MD.   I have reviewed the above documentation for accuracy and completeness, and I agree with the above.

## 2022-05-15 ENCOUNTER — Telehealth: Payer: Self-pay

## 2022-05-15 ENCOUNTER — Inpatient Hospital Stay (HOSPITAL_BASED_OUTPATIENT_CLINIC_OR_DEPARTMENT_OTHER): Payer: Medicare Other | Admitting: Hematology

## 2022-05-15 ENCOUNTER — Other Ambulatory Visit: Payer: Self-pay

## 2022-05-15 ENCOUNTER — Inpatient Hospital Stay: Payer: Medicare Other | Attending: Physician Assistant

## 2022-05-15 ENCOUNTER — Encounter: Payer: Self-pay | Admitting: Hematology

## 2022-05-15 VITALS — BP 175/105 | HR 67 | Temp 98.3°F | Resp 16 | Wt 120.3 lb

## 2022-05-15 DIAGNOSIS — K8689 Other specified diseases of pancreas: Secondary | ICD-10-CM | POA: Insufficient documentation

## 2022-05-15 DIAGNOSIS — Z8719 Personal history of other diseases of the digestive system: Secondary | ICD-10-CM | POA: Diagnosis not present

## 2022-05-15 DIAGNOSIS — K921 Melena: Secondary | ICD-10-CM | POA: Insufficient documentation

## 2022-05-15 DIAGNOSIS — C250 Malignant neoplasm of head of pancreas: Secondary | ICD-10-CM | POA: Insufficient documentation

## 2022-05-15 DIAGNOSIS — R911 Solitary pulmonary nodule: Secondary | ICD-10-CM | POA: Diagnosis not present

## 2022-05-15 DIAGNOSIS — Z7901 Long term (current) use of anticoagulants: Secondary | ICD-10-CM | POA: Diagnosis not present

## 2022-05-15 DIAGNOSIS — R5383 Other fatigue: Secondary | ICD-10-CM | POA: Insufficient documentation

## 2022-05-15 DIAGNOSIS — I700 Atherosclerosis of aorta: Secondary | ICD-10-CM | POA: Insufficient documentation

## 2022-05-15 DIAGNOSIS — E876 Hypokalemia: Secondary | ICD-10-CM

## 2022-05-15 DIAGNOSIS — Z1379 Encounter for other screening for genetic and chromosomal anomalies: Secondary | ICD-10-CM | POA: Diagnosis not present

## 2022-05-15 DIAGNOSIS — Z79899 Other long term (current) drug therapy: Secondary | ICD-10-CM | POA: Diagnosis not present

## 2022-05-15 DIAGNOSIS — Z9049 Acquired absence of other specified parts of digestive tract: Secondary | ICD-10-CM | POA: Insufficient documentation

## 2022-05-15 DIAGNOSIS — R110 Nausea: Secondary | ICD-10-CM | POA: Insufficient documentation

## 2022-05-15 LAB — CBC WITH DIFFERENTIAL (CANCER CENTER ONLY)
Abs Immature Granulocytes: 0 10*3/uL (ref 0.00–0.07)
Basophils Absolute: 0 10*3/uL (ref 0.0–0.1)
Basophils Relative: 0 %
Eosinophils Absolute: 0 10*3/uL (ref 0.0–0.5)
Eosinophils Relative: 0 %
HCT: 30.6 % — ABNORMAL LOW (ref 36.0–46.0)
Hemoglobin: 10.3 g/dL — ABNORMAL LOW (ref 12.0–15.0)
Immature Granulocytes: 0 %
Lymphocytes Relative: 48 %
Lymphs Abs: 1.1 10*3/uL (ref 0.7–4.0)
MCH: 23.7 pg — ABNORMAL LOW (ref 26.0–34.0)
MCHC: 33.7 g/dL (ref 30.0–36.0)
MCV: 70.3 fL — ABNORMAL LOW (ref 80.0–100.0)
Monocytes Absolute: 0.4 10*3/uL (ref 0.1–1.0)
Monocytes Relative: 16 %
Neutro Abs: 0.8 10*3/uL — ABNORMAL LOW (ref 1.7–7.7)
Neutrophils Relative %: 36 %
Platelet Count: 176 10*3/uL (ref 150–400)
RBC: 4.35 MIL/uL (ref 3.87–5.11)
RDW: 17.2 % — ABNORMAL HIGH (ref 11.5–15.5)
WBC Count: 2.3 10*3/uL — ABNORMAL LOW (ref 4.0–10.5)
nRBC: 0 % (ref 0.0–0.2)

## 2022-05-15 LAB — CMP (CANCER CENTER ONLY)
ALT: 9 U/L (ref 0–44)
AST: 23 U/L (ref 15–41)
Albumin: 4.1 g/dL (ref 3.5–5.0)
Alkaline Phosphatase: 86 U/L (ref 38–126)
Anion gap: 9 (ref 5–15)
BUN: 13 mg/dL (ref 8–23)
CO2: 29 mmol/L (ref 22–32)
Calcium: 9.6 mg/dL (ref 8.9–10.3)
Chloride: 100 mmol/L (ref 98–111)
Creatinine: 0.76 mg/dL (ref 0.44–1.00)
GFR, Estimated: >60 mL/min (ref >60)
Glucose, Bld: 136 mg/dL — ABNORMAL HIGH (ref 70–99)
Potassium: 2.8 mmol/L — ABNORMAL LOW (ref 3.5–5.1)
Sodium: 138 mmol/L (ref 135–145)
Total Bilirubin: 0.5 mg/dL (ref 0.3–1.2)
Total Protein: 7.3 g/dL (ref 6.5–8.1)

## 2022-05-15 MED ORDER — POTASSIUM CHLORIDE 20 MEQ PO PACK: PACK | ORAL | 0 refills | Status: DC

## 2022-05-15 NOTE — Telephone Encounter (Signed)
  Made patients daughter aware of message below       ----- Message from Truitt Merle, MD sent at 05/15/2022  2:03 PM EST ----- Please let pt know her K is low, If she is not taking oral KCL, please call in KCL pills or packets, 48mq twice daily for 3 days then once daily for 2 weeks. Thanks

## 2022-05-15 NOTE — Telephone Encounter (Signed)
-----   Message from Truitt Merle, MD sent at 05/15/2022  2:03 PM EST ----- Please let pt know her K is low, If she is not taking oral KCL, please call in KCL pills or packets, 68mq twice daily for 3 days then once daily for 2 weeks. Thanks   YTruitt Merle

## 2022-05-15 NOTE — Assessment & Plan Note (Signed)
--  we drew genetic panel 01/09/22. 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 -she met with genetic counselor Shannon Ortiz on 03/26/22 to discuss the impact of LZTR1 gene on risk of schwannomas and association with Noonan syndrome.

## 2022-05-15 NOTE — Assessment & Plan Note (Signed)
-  stage IB, T3/4 N0 by EUS, unresectable  -diagnosed in 12/2021 --pt and family declined staging chest CT because pt was not sure if she wanted cancer treatment   -she declined chemo and opted to proceed with SBRT treatment under Dr. Lisbeth Renshaw 02/23/22 - 03/05/22. She tolerated well with only mild nausea.

## 2022-05-17 LAB — CANCER ANTIGEN 19-9: CA 19-9: 571 U/mL — ABNORMAL HIGH (ref 0–35)

## 2022-05-19 NOTE — Progress Notes (Signed)
Remote pacemaker transmission.   

## 2022-05-22 ENCOUNTER — Ambulatory Visit (HOSPITAL_COMMUNITY)
Admission: RE | Admit: 2022-05-22 | Discharge: 2022-05-22 | Disposition: A | Discharge status: Home / Self Care | Payer: Medicare Other | Source: Ambulatory Visit | Attending: Hematology | Admitting: Hematology

## 2022-05-22 DIAGNOSIS — K8689 Other specified diseases of pancreas: Secondary | ICD-10-CM | POA: Diagnosis not present

## 2022-05-22 DIAGNOSIS — C250 Malignant neoplasm of head of pancreas: Secondary | ICD-10-CM | POA: Diagnosis not present

## 2022-05-22 DIAGNOSIS — N289 Disorder of kidney and ureter, unspecified: Secondary | ICD-10-CM | POA: Diagnosis not present

## 2022-05-22 MED ORDER — IOHEXOL 300 MG/ML  SOLN
100.0000 mL | Freq: Once | INTRAMUSCULAR | Status: AC | PRN
  Administered 2022-05-22: 100 mL via INTRAVENOUS

## 2022-05-26 NOTE — Progress Notes (Unsigned)
[place]   [phone] Fax:([phone]) [phone]   Clinic Follow up Note   Patient Care Team: Lawerance Cruel, MD as PCP - General (Family Medicine) Josue Hector, MD as PCP - Cardiology (Cardiology) Evans Lance, MD as PCP - Electrophysiology (Cardiology) Michael Boston, MD as Consulting Physician (General Surgery) Ronnette Juniper, MD as Consulting Physician (Gastroenterology) Cordelia Poche as Physician Assistant (Hematology and Oncology) Truitt Merle, MD as Consulting Physician (Oncology)  Date of Service:  05/28/2022  I connected with Shannon Ortiz on 05/28/2022 at  1:20 PM EST by telephone visit and verified that I am speaking with the correct person using two identifiers.  I discussed the limitations, risks, security and privacy concerns of performing an evaluation and management service by telephone and the availability of in person appointments. I also discussed with the patient that there may be a patient responsible charge related to this service. The patient expressed understanding and agreed to proceed.   Other persons participating in the visit and their role in the encounter:  Daughter  Patient's location:  Home Provider's location:  Office  CHIEF COMPLAINT: f/u of pancreatic cancer    CURRENT THERAPY:  Surveillance    ASSESSMENT & PLAN:  Shannon Ortiz is a 86 y.o. female with   Pancreatic cancer ([place]) -stage IB, T3/4 N0 by EUS, unresectable  -diagnosed in 12/2021 --pt and family declined staging chest CT because pt was not sure if she wanted cancer treatment   -she declined chemo and opted to proceed with SBRT treatment under Dr. Lisbeth Renshaw 02/23/22 - 03/05/22. She tolerated well with only mild nausea.  -restaging CT on 12/8 showed stable disease, no new or metastatic lesions     PLAN: -discuss scan which showed stable disease  -refill Potassium -f/u in 3 mths with lab, will order CT on next visit    SUMMARY OF ONCOLOGIC  HISTORY: Oncology History Overview Note   Cancer Staging  Pancreatic cancer [facility]) Staging form: Exocrine Pancreas, AJCC 8th Edition - Clinical stage from 12/30/2021: Stage IB (cT2, cN0, cM0) - Signed by Truitt Merle, MD on 01/09/2022    Pancreatic cancer ([place])  12/15/2021 Imaging   CLINICAL DATA:  Abdominal pain   EXAM: CT ABDOMEN AND PELVIS WITH CONTRAST  IMPRESSION: 1. Marked main pancreatic ductal dilation and mild biliary ductal dilation with ill-defined soft tissue at the pancreatic head, findings are concerning for pancreatic malignancy. Recommend further evaluation with EUS/MRCP. 2. Nonspecific small solid nodule of the left lower lobe. Recommend attention on follow-up. 3. Cardiomegaly and aortic Atherosclerosis (ICD10-I70.0).   12/30/2021 Procedure   Upper EUS, Dr. Paulita Fujita  Impression: - A mass was identified in the pancreatic head. This was staged T3 (T4 if SMA invasion) N0 Mx by endosonographic criteria. Fine needle aspiration performed. - There was no evidence of significant pathology in the left lobe of the liver.   12/30/2021 Initial Biopsy   A. PANCREAS, HEAD OF PANCREAS, FINE NEEDLE  ASPIRATION:   FINAL MICROSCOPIC DIAGNOSIS:  - Malignant cells consistent with adenocarcinoma   12/30/2021 Cancer Staging   Staging form: Exocrine Pancreas, AJCC 8th Edition - Clinical stage from 12/30/2021: Stage IB (cT2, cN0, cM0) - Signed by Truitt Merle, MD on 01/09/2022 Stage prefix: Initial diagnosis Total positive nodes: 0   01/09/2022 Initial Diagnosis   Pancreatic cancer [facility])    Genetic Testing   [facility] CancerNext-Expanded Panel was Positive. 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 Report date is  01/30/2022.  The CancerNext-Expanded gene panel offered by [facility] [employer] and includes sequencing, rearrangement, and RNA analysis for the following 77 genes: AIP, ALK, APC, ATM, AXIN2, BAP1, BARD1, BLM, BMPR1A, BRCA1, BRCA2, BRIP1, CDC73, CDH1,  CDK4, CDKN1B, CDKN2A, CHEK2, CTNNA1, DICER1, FANCC, FH, FLCN, GALNT12, KIF1B, LZTR1, MAX, MEN1, MET, MLH1, MSH2, MSH3, MSH6, MUTYH, NBN, NF1, NF2, NTHL1, PALB2, PHOX2B, PMS2, POT1, PRKAR1A, PTCH1, PTEN, RAD51C, RAD51D, RB1, RECQL, RET, SDHA, SDHAF2, SDHB, SDHC, SDHD, SMAD4, SMARCA4, SMARCB1, SMARCE1, STK11, SUFU, TMEM127, TP53, TSC1, TSC2, VHL and XRCC2 (sequencing and deletion/duplication); EGFR, EGLN1, HOXB13, KIT, MITF, PDGFRA, POLD1, and POLE (sequencing only); EPCAM and GREM1 (deletion/duplication only).    05/22/2022 Imaging    IMPRESSION: 1. Pancreatic head malignancy is not substantially changed from 12/15/2021 CT. Persistent involvement of approximately 60% of the circumference of the SMA by the pancreatic head mass. 2. Persistent marked diffuse pancreatic duct dilation with associated worsening atrophy of the pancreatic body and tail. 3. Mild-to-moderate diffuse intrahepatic biliary ductal dilatation, increased. Dilated common bile duct (10 mm diameter), increased. 4. No metastatic disease in the abdomen or pelvis. 5.  Aortic Atherosclerosis (ICD10-I70.0).      INTERVAL HISTORY:  Shannon Ortiz was contacted for a follow up of pancreatic cancer. She was last seen by me on 05/15/2022. Pt reports of having constipation.   All other systems were reviewed with the patient and are negative.  MEDICAL HISTORY:  Past Medical History:  Diagnosis Date   Acquired diverticulum of distal esophagus with dysphagia 10/03/2018   AKI (acute kidney injury) (HCC) 08/31/2018   Anxiety    Bradycardia 02/16/2014   Chest tightness    DDD (degenerative disc disease), lumbar    DJD (degenerative joint disease)    Dry eyes    Esophageal stenosis 08/31/2018   Facial pain    Fibromyalgia    Hemorrhoid    Hypertension    Hypokalemia 08/31/2018   Hypothyroidism    Lightheadedness    Near syncope 02/16/2014   Osteopenia    Sigmoid diverticulosis    Thyroid nodule    PT DENIES    SURGICAL  HISTORY: Past Surgical History:  Procedure Laterality Date   ABDOMINAL HYSTERECTOMY     COMPLETE   BALLOON DILATION N/A 09/01/2018   Procedure: BALLOON DILATION;  Surgeon: Karki, Arya, MD;  Location: [facility];  Service: Gastroenterology;  Laterality: N/A;   BIOPSY  09/01/2018   Procedure: BIOPSY;  Surgeon: Karki, Arya, MD;  Location: [facility];  Service: Gastroenterology;;   BIOPSY  01/26/2022   Procedure: BIOPSY;  Surgeon: Mansouraty, Gabriel Jr., MD;  Location: [facility];  Service: Gastroenterology;;   BOTOX INJECTION  09/01/2018   Procedure: BOTOX INJECTION;  Surgeon: Karki, Arya, MD;  Location: [facility];  Service: Gastroenterology;;   BREAST SURGERY     breast biopsy   CHOLECYSTECTOMY     ESOPHAGEAL MANOMETRY N/A 10/07/2018   Procedure: ESOPHAGEAL MANOMETRY (EM);  Surgeon: Karki, Arya, MD;  Location: [facility];  Service: Gastroenterology;  Laterality: N/A;   ESOPHAGOGASTRODUODENOSCOPY N/A 10/25/2018   Procedure: ESOPHAGOGASTRODUODENOSCOPY (EGD);  Surgeon: Gross, Steven, MD;  Location: [facility];  Service: General;  Laterality: N/A;   ESOPHAGOGASTRODUODENOSCOPY N/A 01/26/2022   Procedure: ESOPHAGOGASTRODUODENOSCOPY (EGD);  Surgeon: Mansouraty, Gabriel Jr., MD;  Location: [facility];  Service: Gastroenterology;  Laterality: N/A;   ESOPHAGOGASTRODUODENOSCOPY (EGD) WITH PROPOFOL N/A 09/01/2018   Procedure: ESOPHAGOGASTRODUODENOSCOPY (EGD) WITH PROPOFOL;  Surgeon: Karki, Arya, MD;  Location: [facility];  Service: Gastroenterology;  Laterality: N/A;   ESOPHAGOGASTRODUODENOSCOPY (EGD) WITH PROPOFOL   N/A 12/30/2021   Procedure: ESOPHAGOGASTRODUODENOSCOPY (EGD) WITH PROPOFOL;  Surgeon: Outlaw, William, MD;  Location: [facility];  Service: Gastroenterology;  Laterality: N/A;   EUS N/A 01/26/2022   Procedure: UPPER ENDOSCOPIC ULTRASOUND (EUS) RADIAL;  Surgeon: Mansouraty, Gabriel Jr., MD;  Location: [facility];  Service: Gastroenterology;  Laterality: N/A;   FIDUCIAL MARKER PLACEMENT  N/A 01/26/2022   Procedure: FIDUCIAL MARKER PLACEMENT;  Surgeon: Mansouraty, Gabriel Jr., MD;  Location: [facility];  Service: Gastroenterology;  Laterality: N/A;   FINE NEEDLE ASPIRATION N/A 12/30/2021   Procedure: FINE NEEDLE ASPIRATION (FNA) LINEAR;  Surgeon: Outlaw, William, MD;  Location: [facility];  Service: Gastroenterology;  Laterality: N/A;   FOREIGN BODY REMOVAL  01/26/2022   Procedure: FOREIGN BODY REMOVAL;  Surgeon: Mansouraty, Gabriel Jr., MD;  Location: [facility];  Service: Gastroenterology;;   PACEMAKER IMPLANT N/A 03/06/2019   Procedure: PACEMAKER IMPLANT;  Surgeon: Taylor, Gregg W, MD;  Location: [facility];  Service: Cardiovascular;  Laterality: N/A;   UPPER ESOPHAGEAL ENDOSCOPIC ULTRASOUND (EUS) Bilateral 12/30/2021   Procedure: UPPER ESOPHAGEAL ENDOSCOPIC ULTRASOUND (EUS);  Surgeon: Outlaw, William, MD;  Location: [facility];  Service: Gastroenterology;  Laterality: Bilateral;    I have reviewed the social history and family history with the patient and they are unchanged from previous note.  ALLERGIES:  is allergic to ace inhibitors, elavil [amitriptyline hcl], and ketek [telithromycin].  MEDICATIONS:  Current Outpatient Medications  Medication Sig Dispense Refill   potassium chloride SA (KLOR-CON M) 20 MEQ tablet Take 1 tablet (20 mEq total) by mouth daily. 30 tablet 2   amLODipine (NORVASC) 10 MG tablet Take 1 tablet (10 mg total) by mouth daily. 90 tablet 3   apixaban (ELIQUIS) 5 MG TABS tablet Take 1 tablet (5 mg total) by mouth 2 (two) times daily. 60 tablet 10   docusate sodium (COLACE) 100 MG capsule Take 100 mg by mouth 2 (two) times daily.     feeding supplement (ENSURE SURGERY) LIQD Take 237 mLs by mouth 2 (two) times daily between meals. (Patient taking differently: Take 237 mLs by mouth 3 (three) times a week.) 60 Bottle 0   losartan (COZAAR) 100 MG tablet Take 100 mg by mouth in the morning.     metoprolol succinate (TOPROL-XL) 50 MG 24 hr  tablet Take 50 mg by mouth in the morning.     ondansetron (ZOFRAN-ODT) 4 MG disintegrating tablet Take 4 mg by mouth every 8 (eight) hours as needed for nausea.     pantoprazole (PROTONIX) 40 MG tablet Take 1 tablet (40 mg total) by mouth daily. 30 tablet 6   polyethylene glycol (MIRALAX) 17 g packet Take 17 g by mouth daily. (Patient not taking: Reported on 01/21/2022) 30 each 3   potassium chloride (KLOR-CON) 20 MEQ packet 1 packet (20 meq) 2 times daily for 3 days. Then 1 packet (20 meq) daily for 2 weeks 20 packet 0   No current facility-administered medications for this visit.    PHYSICAL EXAMINATION: ECOG PERFORMANCE STATUS: 1 - Symptomatic but completely ambulatory  There were no vitals filed for this visit. Wt Readings from Last 3 Encounters:  05/15/22 120 lb 4.8 oz (54.6 kg)  03/27/22 125 lb 3.2 oz (56.8 kg)  01/26/22 126 lb 15.8 oz (57.6 kg)     No vitals taken today, Exam not performed today  LABORATORY DATA:  I have reviewed the data as listed    Latest Ref Rng & Units 05/15/2022   10:28 AM 03/27/2022    3:08   PM 01/09/2022    2:53 PM  CBC  WBC 4.0 - 10.5 K/uL 2.3  2.9  3.4   Hemoglobin 12.0 - 15.0 g/dL 10.3  9.8  10.2   Hematocrit 36.0 - 46.0 % 30.6  28.4  30.8   Platelets 150 - 400 K/uL 176  185  253         Latest Ref Rng & Units 05/15/2022   10:28 AM 03/27/2022    3:08 PM 01/09/2022    2:53 PM  CMP  Glucose 70 - 99 mg/dL 136  154  125   BUN 8 - 23 mg/dL _0 Creatinine 0.44 - 1.00 mg/dL 0.76  0.95  0.81   Sodium 135 - 145 mmol/L 138  138  139   Potassium 3.5 - 5.1 mmol/L 2.8  3.2  3.5   Chloride 98 - 111 mmol/L 100  104  106   CO2 22 - 32 mmol/L _1 Calcium 8.9 - 10.3 mg/dL 9.6  9.3  9.7   Total Protein 6.5 - 8.1 g/dL 7.3  7.0  7.2   Total Bilirubin 0.3 - 1.2 mg/dL 0.5  0.5  0.5   Alkaline Phos 38 - 126 U/L 86  94  52   AST 15 - 41 U/L 23  35  16   ALT 0 - 44 U/L _2 RADIOGRAPHIC STUDIES: I have personally reviewed the  radiological images as listed and agreed with the findings in the report. No results found.    No orders of the defined types were placed in this encounter.  All questions were answered. The patient knows to call the clinic with any problems, questions or concerns. No barriers to learning was detected. The total time spent in the appointment was 15 minutes.     Truitt Merle, MD 05/28/2022   Felicity Coyer am acting as scribe for Truitt Merle, MD.   I have reviewed the above documentation for accuracy and completeness, and I agree with the above.

## 2022-05-28 ENCOUNTER — Inpatient Hospital Stay (HOSPITAL_BASED_OUTPATIENT_CLINIC_OR_DEPARTMENT_OTHER): Payer: Medicare Other | Admitting: Hematology

## 2022-05-28 ENCOUNTER — Encounter: Payer: Self-pay | Admitting: Hematology

## 2022-05-28 DIAGNOSIS — C250 Malignant neoplasm of head of pancreas: Secondary | ICD-10-CM

## 2022-05-28 DIAGNOSIS — E876 Hypokalemia: Secondary | ICD-10-CM

## 2022-05-28 MED ORDER — POTASSIUM CHLORIDE CRYS ER 20 MEQ PO TBCR: 20.0000 meq | EXTENDED_RELEASE_TABLET | Freq: Every day | ORAL | 2 refills | Status: DC

## 2022-05-28 NOTE — Assessment & Plan Note (Signed)
-  stage IB, T3/4 N0 by EUS, unresectable  -diagnosed in 12/2021 --pt and family declined staging chest CT because pt was not sure if she wanted cancer treatment   -she declined chemo and opted to proceed with SBRT treatment under Dr. Lisbeth Renshaw 02/23/22 - 03/05/22. She tolerated well with only mild nausea.  -restaging CT on 12/8 showed stable disease, no new or metastatic lesions

## 2022-05-30 ENCOUNTER — Telehealth: Payer: Self-pay | Admitting: Hematology

## 2022-05-30 NOTE — Telephone Encounter (Signed)
Called patient to confirm upcoming appointments unable to leave a VM

## 2022-06-24 ENCOUNTER — Ambulatory Visit: Payer: Medicare Other | Admitting: Hematology

## 2022-06-24 ENCOUNTER — Other Ambulatory Visit: Payer: Medicare Other

## 2022-07-07 DIAGNOSIS — I10 Essential (primary) hypertension: Secondary | ICD-10-CM | POA: Diagnosis not present

## 2022-07-07 DIAGNOSIS — I480 Paroxysmal atrial fibrillation: Secondary | ICD-10-CM | POA: Diagnosis not present

## 2022-07-07 DIAGNOSIS — E7800 Pure hypercholesterolemia, unspecified: Secondary | ICD-10-CM | POA: Diagnosis not present

## 2022-07-24 DIAGNOSIS — I480 Paroxysmal atrial fibrillation: Secondary | ICD-10-CM | POA: Diagnosis not present

## 2022-07-24 DIAGNOSIS — Z6821 Body mass index (BMI) 21.0-21.9, adult: Secondary | ICD-10-CM | POA: Diagnosis not present

## 2022-07-24 DIAGNOSIS — I10 Essential (primary) hypertension: Secondary | ICD-10-CM | POA: Diagnosis not present

## 2022-07-24 DIAGNOSIS — D6869 Other thrombophilia: Secondary | ICD-10-CM | POA: Diagnosis not present

## 2022-07-24 DIAGNOSIS — C250 Malignant neoplasm of head of pancreas: Secondary | ICD-10-CM | POA: Diagnosis not present

## 2022-07-31 ENCOUNTER — Ambulatory Visit (INDEPENDENT_AMBULATORY_CARE_PROVIDER_SITE_OTHER): Payer: Medicare Other

## 2022-07-31 DIAGNOSIS — I495 Sick sinus syndrome: Secondary | ICD-10-CM | POA: Diagnosis not present

## 2022-07-31 LAB — CUP PACEART REMOTE DEVICE CHECK
Battery Remaining Longevity: 70 mo
Battery Remaining Percentage: 63 %
Battery Voltage: 2.98 V
Brady Statistic AP VP Percent: 1.3 %
Brady Statistic AP VS Percent: 94 %
Brady Statistic AS VP Percent: 1 %
Brady Statistic AS VS Percent: 2.2 %
Brady Statistic RA Percent Paced: 88 %
Brady Statistic RV Percent Paced: 3.2 %
Date Time Interrogation Session: 20240216020012
Implantable Lead Connection Status: 753985
Implantable Lead Connection Status: 753985
Implantable Lead Implant Date: 20200921
Implantable Lead Implant Date: 20200921
Implantable Lead Location: 753859
Implantable Lead Location: 753860
Implantable Pulse Generator Implant Date: 20200921
Lead Channel Impedance Value: 380 Ohm
Lead Channel Impedance Value: 600 Ohm
Lead Channel Pacing Threshold Amplitude: 0.875 V
Lead Channel Pacing Threshold Amplitude: 1.625 V
Lead Channel Pacing Threshold Pulse Width: 0.5 ms
Lead Channel Pacing Threshold Pulse Width: 0.5 ms
Lead Channel Sensing Intrinsic Amplitude: 1.8 mV
Lead Channel Sensing Intrinsic Amplitude: 12 mV
Lead Channel Setting Pacing Amplitude: 1.125
Lead Channel Setting Pacing Amplitude: 3.125
Lead Channel Setting Pacing Pulse Width: 0.5 ms
Lead Channel Setting Sensing Sensitivity: 2 mV
Pulse Gen Model: 2272
Pulse Gen Serial Number: 9162202

## 2022-08-11 ENCOUNTER — Other Ambulatory Visit: Payer: Self-pay | Admitting: Student

## 2022-08-25 NOTE — Progress Notes (Signed)
Remote pacemaker transmission.   

## 2022-08-28 ENCOUNTER — Inpatient Hospital Stay: Payer: Medicare Other | Admitting: Hematology

## 2022-08-28 ENCOUNTER — Inpatient Hospital Stay: Payer: Medicare Other | Attending: Physician Assistant

## 2022-08-28 NOTE — Progress Notes (Deleted)
[facility]   [phone] [phone]   Clinic Follow up Note   Patient Care Team: Lawerance Cruel, MD as PCP - General (Family Medicine) Josue Hector, MD as PCP - Cardiology (Cardiology) Evans Lance, MD as PCP - Electrophysiology (Cardiology) Michael Boston, MD as Consulting Physician (General Surgery) Ronnette Juniper, MD as Consulting Physician (Gastroenterology) Cordelia Poche as Physician Assistant (Hematology and Oncology) Truitt Merle, MD as Consulting Physician (Oncology)  Date of Service:  08/28/2022  CHIEF COMPLAINT: f/u of  pancreatic cancer    CURRENT THERAPY: Surveillance     ASSESSMENT: *** Shannon Ortiz is a 87 y.o. female with   No problem-specific Assessment & Plan notes found for this encounter.  ***   PLAN:   SUMMARY OF ONCOLOGIC HISTORY: Oncology History Overview Note   Cancer Staging  Pancreatic cancer [facility]) Staging form: Exocrine Pancreas, AJCC 8th Edition - Clinical stage from 12/30/2021: Stage IB (cT2, cN0, cM0) - Signed by Truitt Merle, MD on 01/09/2022    Pancreatic cancer ([place])  12/15/2021 Imaging   CLINICAL DATA:  Abdominal pain   EXAM: CT ABDOMEN AND PELVIS WITH CONTRAST  IMPRESSION: 1. Marked main pancreatic ductal dilation and mild biliary ductal dilation with ill-defined soft tissue at the pancreatic head, findings are concerning for pancreatic malignancy. Recommend further evaluation with EUS/MRCP. 2. Nonspecific small solid nodule of the left lower lobe. Recommend attention on follow-up. 3. Cardiomegaly and aortic Atherosclerosis (ICD10-I70.0).   12/30/2021 Procedure   Upper EUS, Dr. Paulita Fujita  Impression: - A mass was identified in the pancreatic head. This was staged T3 (T4 if SMA invasion) N0 Mx by endosonographic criteria. Fine needle aspiration performed. - There was no evidence of significant pathology in the left lobe of the liver.   12/30/2021 Initial Biopsy   A. PANCREAS, HEAD OF  PANCREAS, FINE NEEDLE  ASPIRATION:   FINAL MICROSCOPIC DIAGNOSIS:  - Malignant cells consistent with adenocarcinoma   12/30/2021 Cancer Staging   Staging form: Exocrine Pancreas, AJCC 8th Edition - Clinical stage from 12/30/2021: Stage IB (cT2, cN0, cM0) - Signed by Truitt Merle, MD on 01/09/2022 Stage prefix: Initial diagnosis Total positive nodes: 0   01/09/2022 Initial Diagnosis   Pancreatic cancer [facility])    Genetic Testing   [facility] CancerNext-Expanded Panel was Positive. 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 Report date is 01/30/2022.  The CancerNext-Expanded gene panel offered by [facility] and includes sequencing, rearrangement, and RNA analysis for the following 77 genes: AIP, ALK, APC, ATM, AXIN2, BAP1, BARD1, BLM, BMPR1A, BRCA1, BRCA2, BRIP1, CDC73, CDH1, CDK4, CDKN1B, CDKN2A, CHEK2, CTNNA1, DICER1, FANCC, FH, FLCN, GALNT12, KIF1B, LZTR1, MAX, MEN1, MET, MLH1, MSH2, MSH3, MSH6, MUTYH, NBN, NF1, NF2, NTHL1, PALB2, PHOX2B, PMS2, POT1, PRKAR1A, PTCH1, PTEN, RAD51C, RAD51D, RB1, RECQL, RET, SDHA, SDHAF2, SDHB, SDHC, SDHD, SMAD4, SMARCA4, SMARCB1, SMARCE1, STK11, SUFU, TMEM127, TP53, TSC1, TSC2, VHL and XRCC2 (sequencing and deletion/duplication); EGFR, EGLN1, HOXB13, KIT, MITF, PDGFRA, POLD1, and POLE (sequencing only); EPCAM and GREM1 (deletion/duplication only).    05/22/2022 Imaging    IMPRESSION: 1. Pancreatic head malignancy is not substantially changed from 12/15/2021 CT. Persistent involvement of approximately 60% of the circumference of the SMA by the pancreatic head mass. 2. Persistent marked diffuse pancreatic duct dilation with associated worsening atrophy of the pancreatic body and tail. 3. Mild-to-moderate diffuse intrahepatic biliary ductal dilatation, increased. Dilated common bile duct (10 mm diameter), increased. 4. No metastatic disease in the abdomen or pelvis. 5.  Aortic Atherosclerosis (ICD10-I70.0).      INTERVAL  HISTORY: *** Shannon Ortiz is here for a follow up of  pancreatic cancer   She was last seen by me on 05/28/2022 She presents to the clinic     All other systems were reviewed with the patient and are negative.  MEDICAL HISTORY:  Past Medical History:  Diagnosis Date   Acquired diverticulum of distal esophagus with dysphagia 10/03/2018   AKI (acute kidney injury) ([place]) 08/31/2018   Anxiety    Bradycardia 02/16/2014   Chest tightness    DDD (degenerative disc disease), lumbar    DJD (degenerative joint disease)    Dry eyes    Esophageal stenosis 08/31/2018   Facial pain    Fibromyalgia    Hemorrhoid    Hypertension    Hypokalemia 08/31/2018   Hypothyroidism    Lightheadedness    Near syncope 02/16/2014   Osteopenia    Sigmoid diverticulosis    Thyroid nodule    PT DENIES    SURGICAL HISTORY: Past Surgical History:  Procedure Laterality Date   ABDOMINAL HYSTERECTOMY     COMPLETE   BALLOON DILATION N/A 09/01/2018   Procedure: BALLOON DILATION;  Surgeon: Ronnette Juniper, MD;  Location: [place];  Service: Gastroenterology;  Laterality: N/A;   BIOPSY  09/01/2018   Procedure: BIOPSY;  Surgeon: Ronnette Juniper, MD;  Location: [facility] ENDOSCOPY;  Service: Gastroenterology;;   BIOPSY  01/26/2022   Procedure: BIOPSY;  Surgeon: Irving Copas., MD;  Location: [facility];  Service: Gastroenterology;;   BOTOX INJECTION  09/01/2018   Procedure: BOTOX INJECTION;  Surgeon: Ronnette Juniper, MD;  Location[facility];  Service: Gastroenterology;;   BREAST SURGERY     breast biopsy   CHOLECYSTECTOMY     ESOPHAGEAL MANOMETRY N/A 10/07/2018   Procedure: ESOPHAGEAL MANOMETRY (EM);  Surgeon: Ronnette Juniper, MD;  Location: [facility] ENDOSCOPY;  Service: Gastroenterology;  Laterality: N/A;   ESOPHAGOGASTRODUODENOSCOPY N/A 10/25/2018   Procedure: ESOPHAGOGASTRODUODENOSCOPY (EGD);  Surgeon: Michael Boston, MD;  Location: [facility];  Service: General;  Laterality: N/A;   ESOPHAGOGASTRODUODENOSCOPY N/A 01/26/2022    Procedure: ESOPHAGOGASTRODUODENOSCOPY (EGD);  Surgeon: Irving Copas., MD;  Location: [facility];  Service: Gastroenterology;  Laterality: N/A;   ESOPHAGOGASTRODUODENOSCOPY (EGD) WITH PROPOFOL N/A 09/01/2018   Procedure: ESOPHAGOGASTRODUODENOSCOPY (EGD) WITH PROPOFOL;  Surgeon: Ronnette Juniper, MD;  Location: [place];  Service: Gastroenterology;  Laterality: N/A;   ESOPHAGOGASTRODUODENOSCOPY (EGD) WITH PROPOFOL N/A 12/30/2021   Procedure: ESOPHAGOGASTRODUODENOSCOPY (EGD) WITH PROPOFOL;  Surgeon: Arta Silence, MD;  Location: [facility] ENDOSCOPY;  Service: Gastroenterology;  Laterality: N/A;   EUS N/A 01/26/2022   Procedure: UPPER ENDOSCOPIC ULTRASOUND (EUS) RADIAL;  Surgeon: Irving Copas., MD;  Location: [facility] ENDOSCOPY;  Service: Gastroenterology;  Laterality: N/A;   FIDUCIAL MARKER PLACEMENT N/A 01/26/2022   Procedure: FIDUCIAL MARKER PLACEMENT;  Surgeon: Irving Copas., MD;  Location: [facility] ENDOSCOPY;  Service: Gastroenterology;  Laterality: N/A;   FINE NEEDLE ASPIRATION N/A 12/30/2021   Procedure: FINE NEEDLE ASPIRATION (FNA) LINEAR;  Surgeon: Arta Silence, MD;  Location: [facility] ENDOSCOPY;  Service: Gastroenterology;  Laterality: N/A;   FOREIGN BODY REMOVAL  01/26/2022   Procedure: FOREIGN BODY REMOVAL;  Surgeon: Rush Landmark Telford Nab., MD;  Location: [facility];  Service: Gastroenterology;;   PACEMAKER IMPLANT N/A 03/06/2019   Procedure: PACEMAKER IMPLANT;  Surgeon: Evans Lance, MD;  Location: [place] [facility];  Service: Cardiovascular;  Laterality: N/A;   UPPER ESOPHAGEAL ENDOSCOPIC ULTRASOUND (EUS) Bilateral 12/30/2021   Procedure: UPPER ESOPHAGEAL ENDOSCOPIC ULTRASOUND (EUS);  Surgeon: Arta Silence, MD;  Location: [facility];  Service: Gastroenterology;  Laterality: Bilateral;    I have reviewed the social history and family history with the patient and they are unchanged from previous note.  ALLERGIES:  is allergic to ace inhibitors, elavil [amitriptyline hcl],  and ketek [telithromycin].  MEDICATIONS:  Current Outpatient Medications  Medication Sig Dispense Refill   amLODipine (NORVASC) 10 MG tablet Take 1 tablet by mouth once daily 90 tablet 0   apixaban (ELIQUIS) 5 MG TABS tablet Take 1 tablet (5 mg total) by mouth 2 (two) times daily. 60 tablet 10   docusate sodium (COLACE) 100 MG capsule Take 100 mg by mouth 2 (two) times daily.     feeding supplement (ENSURE SURGERY) LIQD Take 237 mLs by mouth 2 (two) times daily between meals. (Patient taking differently: Take 237 mLs by mouth 3 (three) times a week.) 60 Bottle 0   losartan (COZAAR) 100 MG tablet Take 100 mg by mouth in the morning.     metoprolol succinate (TOPROL-XL) 50 MG 24 hr tablet Take 50 mg by mouth in the morning.     ondansetron (ZOFRAN-ODT) 4 MG disintegrating tablet Take 4 mg by mouth every 8 (eight) hours as needed for nausea.     pantoprazole (PROTONIX) 40 MG tablet Take 1 tablet (40 mg total) by mouth daily. 30 tablet 6   polyethylene glycol (MIRALAX) 17 g packet Take 17 g by mouth daily. (Patient not taking: Reported on 01/21/2022) 30 each 3   potassium chloride (KLOR-CON) 20 MEQ packet 1 packet (20 meq) 2 times daily for 3 days. Then 1 packet (20 meq) daily for 2 weeks 20 packet 0   potassium chloride SA (KLOR-CON M) 20 MEQ tablet Take 1 tablet (20 mEq total) by mouth daily. 30 tablet 2   No current facility-administered medications for this visit.    PHYSICAL EXAMINATION: ECOG PERFORMANCE STATUS: {CHL ONC ECOG PS:[id number]}  There were no vitals filed for this visit. Wt Readings from Last 3 Encounters:  05/15/22 120 lb 4.8 oz (54.6 kg)  03/27/22 125 lb 3.2 oz (56.8 kg)  01/26/22 126 lb 15.8 oz (57.6 kg)    {Only keep what was examined. If exam not performed, can use .CEXAM } GENERAL:alert, no distress and comfortable SKIN: skin color, texture, turgor are normal, no rashes or significant lesions EYES: normal, Conjunctiva are pink and non-injected, sclera  clear {OROPHARYNX:no exudate, no erythema and lips, buccal mucosa, and tongue normal}  NECK: supple, thyroid normal size, non-tender, without nodularity LYMPH:  no palpable lymphadenopathy in the cervical, axillary {or inguinal} LUNGS: clear to auscultation and percussion with normal breathing effort HEART: regular rate & rhythm and no murmurs and no lower extremity edema ABDOMEN:abdomen soft, non-tender and normal bowel sounds Musculoskeletal:no cyanosis of digits and no clubbing  NEURO: alert & oriented x 3 with fluent speech, no focal motor/sensory deficits  LABORATORY DATA:  I have reviewed the data as listed    Latest Ref Rng & Units 05/15/2022   10:28 AM 03/27/2022    3:08 PM 01/09/2022    2:53 PM  CBC  WBC 4.0 - 10.5 K/uL 2.3  2.9  3.4   Hemoglobin 12.0 - 15.0 g/dL 10.3  9.8  10.2   Hematocrit 36.0 - 46.0 % 30.6  28.4  30.8   Platelets 150 - 400 K/uL 176  185  253         Latest Ref Rng & Units 05/15/2022   10:28 AM 03/27/2022  3:08 PM 01/09/2022    2:53 PM  CMP  Glucose 70 - 99 mg/dL 136  154  125   BUN 8 - 23 mg/dL 13  12  16    Creatinine 0.44 - 1.00 mg/dL 0.76  0.95  0.81   Sodium 135 - 145 mmol/L 138  138  139   Potassium 3.5 - 5.1 mmol/L 2.8  3.2  3.5   Chloride 98 - 111 mmol/L 100  104  106   CO2 22 - 32 mmol/L 29  29  27    Calcium 8.9 - 10.3 mg/dL 9.6  9.3  9.7   Total Protein 6.5 - 8.1 g/dL 7.3  7.0  7.2   Total Bilirubin 0.3 - 1.2 mg/dL 0.5  0.5  0.5   Alkaline Phos 38 - 126 U/L 86  94  52   AST 15 - 41 U/L 23  35  16   ALT 0 - 44 U/L 9  17  5        RADIOGRAPHIC STUDIES: I have personally reviewed the radiological images as listed and agreed with the findings in the report. No results found.    No orders of the defined types were placed in this encounter.  All questions were answered. The patient knows to call the clinic with any problems, questions or concerns. No barriers to learning was detected. The total time spent in the appointment was {CHL  ONC TIME VISIT - [id number].     Baldemar Friday, CMA 08/28/2022   I, Audry Riles, CMA, am acting as scribe for Truitt Merle, MD.   {Add scribe attestation statement}

## 2022-08-28 NOTE — Assessment & Plan Note (Deleted)
-  stage IB, T3/4 N0 by EUS, unresectable  -diagnosed in 12/2021 --pt and family declined staging chest CT because pt was not sure if she wanted cancer treatment   -she declined chemo and opted to proceed with SBRT treatment under Dr. Moody 02/23/22 - 03/05/22. She tolerated well with only mild nausea.  -restaging CT on 12/8 showed stable disease, no new or metastatic lesions  

## 2022-09-08 ENCOUNTER — Other Ambulatory Visit: Payer: Self-pay | Admitting: Student

## 2022-09-08 DIAGNOSIS — I480 Paroxysmal atrial fibrillation: Secondary | ICD-10-CM

## 2022-09-09 ENCOUNTER — Other Ambulatory Visit: Payer: Self-pay | Admitting: Pharmacist Clinician (PhC)/ Clinical Pharmacy Specialist

## 2022-09-09 MED ORDER — APIXABAN 2.5 MG PO TABS: 2.5000 mg | ORAL_TABLET | Freq: Two times a day (BID) | ORAL | 1 refills | Status: DC

## 2022-09-09 NOTE — Telephone Encounter (Signed)
Spoke with caregiver [name], explained dose change.  She voiced understanding

## 2022-09-09 NOTE — Telephone Encounter (Signed)
Prescription refill request for Eliquis received. Indication: Afib  Last office visit: 12/26/21 Harriet Pho)  Scr: 0.76 (05/15/22)  Age: 87 Weight: 54.6kg  Dose not appropriate per dosing criteria. Will forward to PharmD for review.

## 2022-10-30 ENCOUNTER — Ambulatory Visit (INDEPENDENT_AMBULATORY_CARE_PROVIDER_SITE_OTHER): Payer: Medicare Other

## 2022-10-30 DIAGNOSIS — I495 Sick sinus syndrome: Secondary | ICD-10-CM

## 2022-10-30 LAB — CUP PACEART REMOTE DEVICE CHECK
Battery Remaining Longevity: 66 mo
Battery Remaining Percentage: 60 %
Battery Voltage: 2.99 V
Brady Statistic AP VP Percent: 1.2 %
Brady Statistic AP VS Percent: 94 %
Brady Statistic AS VP Percent: 1 %
Brady Statistic AS VS Percent: 2 %
Brady Statistic RA Percent Paced: 89 %
Brady Statistic RV Percent Paced: 2.8 %
Date Time Interrogation Session: 20240517020012
Implantable Lead Connection Status: 753985
Implantable Lead Connection Status: 753985
Implantable Lead Implant Date: 20200921
Implantable Lead Implant Date: 20200921
Implantable Lead Location: 753859
Implantable Lead Location: 753860
Implantable Pulse Generator Implant Date: 20200921
Lead Channel Impedance Value: 390 Ohm
Lead Channel Impedance Value: 630 Ohm
Lead Channel Pacing Threshold Amplitude: 0.75 V
Lead Channel Pacing Threshold Amplitude: 0.875 V
Lead Channel Pacing Threshold Pulse Width: 0.5 ms
Lead Channel Pacing Threshold Pulse Width: 0.5 ms
Lead Channel Sensing Intrinsic Amplitude: 12 mV
Lead Channel Sensing Intrinsic Amplitude: 2.5 mV
Lead Channel Setting Pacing Amplitude: 1.125
Lead Channel Setting Pacing Amplitude: 1.75 V
Lead Channel Setting Pacing Pulse Width: 0.5 ms
Lead Channel Setting Sensing Sensitivity: 2 mV
Pulse Gen Model: 2272
Pulse Gen Serial Number: 9162202

## 2022-11-11 NOTE — Progress Notes (Signed)
Remote pacemaker transmission.   

## 2022-11-17 ENCOUNTER — Other Ambulatory Visit: Payer: Self-pay | Admitting: Internal Medicine

## 2022-11-23 ENCOUNTER — Encounter (HOSPITAL_COMMUNITY): Payer: Self-pay

## 2022-11-23 ENCOUNTER — Inpatient Hospital Stay (HOSPITAL_COMMUNITY)
Admission: EM | Admit: 2022-11-23 | Discharge: 2022-12-03 | DRG: 308 | Disposition: A | Discharge status: Skilled Nursing Facility | Payer: Medicare Other | Attending: Family Medicine | Admitting: Family Medicine

## 2022-11-23 ENCOUNTER — Other Ambulatory Visit: Payer: Self-pay

## 2022-11-23 ENCOUNTER — Emergency Department (HOSPITAL_COMMUNITY): Payer: Medicare Other

## 2022-11-23 DIAGNOSIS — Z950 Presence of cardiac pacemaker: Secondary | ICD-10-CM | POA: Diagnosis not present

## 2022-11-23 DIAGNOSIS — I4891 Unspecified atrial fibrillation: Secondary | ICD-10-CM | POA: Diagnosis not present

## 2022-11-23 DIAGNOSIS — R079 Chest pain, unspecified: Secondary | ICD-10-CM | POA: Diagnosis not present

## 2022-11-23 DIAGNOSIS — Z7189 Other specified counseling: Secondary | ICD-10-CM | POA: Diagnosis not present

## 2022-11-23 DIAGNOSIS — D509 Iron deficiency anemia, unspecified: Secondary | ICD-10-CM | POA: Diagnosis present

## 2022-11-23 DIAGNOSIS — M6281 Muscle weakness (generalized): Secondary | ICD-10-CM | POA: Diagnosis not present

## 2022-11-23 DIAGNOSIS — I6782 Cerebral ischemia: Secondary | ICD-10-CM | POA: Diagnosis not present

## 2022-11-23 DIAGNOSIS — D638 Anemia in other chronic diseases classified elsewhere: Secondary | ICD-10-CM | POA: Diagnosis present

## 2022-11-23 DIAGNOSIS — I2489 Other forms of acute ischemic heart disease: Secondary | ICD-10-CM | POA: Diagnosis not present

## 2022-11-23 DIAGNOSIS — I63233 Cerebral infarction due to unspecified occlusion or stenosis of bilateral carotid arteries: Secondary | ICD-10-CM | POA: Diagnosis not present

## 2022-11-23 DIAGNOSIS — M797 Fibromyalgia: Secondary | ICD-10-CM | POA: Diagnosis not present

## 2022-11-23 DIAGNOSIS — D696 Thrombocytopenia, unspecified: Secondary | ICD-10-CM | POA: Diagnosis not present

## 2022-11-23 DIAGNOSIS — Z7401 Bed confinement status: Secondary | ICD-10-CM | POA: Diagnosis not present

## 2022-11-23 DIAGNOSIS — R262 Difficulty in walking, not elsewhere classified: Secondary | ICD-10-CM | POA: Diagnosis not present

## 2022-11-23 DIAGNOSIS — I63443 Cerebral infarction due to embolism of bilateral cerebellar arteries: Secondary | ICD-10-CM | POA: Diagnosis not present

## 2022-11-23 DIAGNOSIS — I472 Ventricular tachycardia, unspecified: Secondary | ICD-10-CM | POA: Diagnosis not present

## 2022-11-23 DIAGNOSIS — Z79899 Other long term (current) drug therapy: Secondary | ICD-10-CM

## 2022-11-23 DIAGNOSIS — E43 Unspecified severe protein-calorie malnutrition: Secondary | ICD-10-CM | POA: Insufficient documentation

## 2022-11-23 DIAGNOSIS — I480 Paroxysmal atrial fibrillation: Secondary | ICD-10-CM | POA: Diagnosis not present

## 2022-11-23 DIAGNOSIS — I10 Essential (primary) hypertension: Secondary | ICD-10-CM | POA: Diagnosis not present

## 2022-11-23 DIAGNOSIS — Z1152 Encounter for screening for COVID-19: Secondary | ICD-10-CM | POA: Diagnosis not present

## 2022-11-23 DIAGNOSIS — R1310 Dysphagia, unspecified: Secondary | ICD-10-CM | POA: Diagnosis not present

## 2022-11-23 DIAGNOSIS — Z681 Body mass index (BMI) 19 or less, adult: Secondary | ICD-10-CM

## 2022-11-23 DIAGNOSIS — Z7901 Long term (current) use of anticoagulants: Secondary | ICD-10-CM

## 2022-11-23 DIAGNOSIS — R1312 Dysphagia, oropharyngeal phase: Secondary | ICD-10-CM | POA: Diagnosis not present

## 2022-11-23 DIAGNOSIS — I429 Cardiomyopathy, unspecified: Secondary | ICD-10-CM | POA: Diagnosis not present

## 2022-11-23 DIAGNOSIS — R531 Weakness: Secondary | ICD-10-CM | POA: Diagnosis not present

## 2022-11-23 DIAGNOSIS — I495 Sick sinus syndrome: Secondary | ICD-10-CM | POA: Diagnosis present

## 2022-11-23 DIAGNOSIS — R4182 Altered mental status, unspecified: Secondary | ICD-10-CM | POA: Diagnosis present

## 2022-11-23 DIAGNOSIS — F419 Anxiety disorder, unspecified: Secondary | ICD-10-CM | POA: Diagnosis present

## 2022-11-23 DIAGNOSIS — E039 Hypothyroidism, unspecified: Secondary | ICD-10-CM | POA: Diagnosis present

## 2022-11-23 DIAGNOSIS — C259 Malignant neoplasm of pancreas, unspecified: Secondary | ICD-10-CM | POA: Diagnosis not present

## 2022-11-23 DIAGNOSIS — Z515 Encounter for palliative care: Secondary | ICD-10-CM | POA: Diagnosis not present

## 2022-11-23 DIAGNOSIS — E876 Hypokalemia: Secondary | ICD-10-CM | POA: Diagnosis not present

## 2022-11-23 DIAGNOSIS — R4701 Aphasia: Secondary | ICD-10-CM | POA: Diagnosis not present

## 2022-11-23 DIAGNOSIS — R404 Transient alteration of awareness: Secondary | ICD-10-CM | POA: Diagnosis not present

## 2022-11-23 DIAGNOSIS — Z888 Allergy status to other drugs, medicaments and biological substances status: Secondary | ICD-10-CM

## 2022-11-23 DIAGNOSIS — Z8249 Family history of ischemic heart disease and other diseases of the circulatory system: Secondary | ICD-10-CM

## 2022-11-23 DIAGNOSIS — R278 Other lack of coordination: Secondary | ICD-10-CM | POA: Diagnosis not present

## 2022-11-23 DIAGNOSIS — R410 Disorientation, unspecified: Secondary | ICD-10-CM | POA: Diagnosis not present

## 2022-11-23 DIAGNOSIS — Z743 Need for continuous supervision: Secondary | ICD-10-CM | POA: Diagnosis not present

## 2022-11-23 DIAGNOSIS — E785 Hyperlipidemia, unspecified: Secondary | ICD-10-CM | POA: Diagnosis not present

## 2022-11-23 DIAGNOSIS — D6869 Other thrombophilia: Secondary | ICD-10-CM | POA: Diagnosis not present

## 2022-11-23 DIAGNOSIS — I639 Cerebral infarction, unspecified: Secondary | ICD-10-CM | POA: Diagnosis not present

## 2022-11-23 DIAGNOSIS — I6320 Cerebral infarction due to unspecified occlusion or stenosis of unspecified precerebral arteries: Secondary | ICD-10-CM | POA: Diagnosis not present

## 2022-11-23 DIAGNOSIS — Z833 Family history of diabetes mellitus: Secondary | ICD-10-CM

## 2022-11-23 DIAGNOSIS — E871 Hypo-osmolality and hyponatremia: Secondary | ICD-10-CM | POA: Diagnosis not present

## 2022-11-23 DIAGNOSIS — M6259 Muscle wasting and atrophy, not elsewhere classified, multiple sites: Secondary | ICD-10-CM | POA: Diagnosis not present

## 2022-11-23 LAB — CBC
HCT: 33.1 % — ABNORMAL LOW (ref 36.0–46.0)
Hemoglobin: 10.8 g/dL — ABNORMAL LOW (ref 12.0–15.0)
MCH: 23.7 pg — ABNORMAL LOW (ref 26.0–34.0)
MCHC: 32.6 g/dL (ref 30.0–36.0)
MCV: 72.6 fL — ABNORMAL LOW (ref 80.0–100.0)
Platelets: 131 10*3/uL — ABNORMAL LOW (ref 150–400)
RBC: 4.56 MIL/uL (ref 3.87–5.11)
RDW: 16.5 % — ABNORMAL HIGH (ref 11.5–15.5)
WBC: 13.6 10*3/uL — ABNORMAL HIGH (ref 4.0–10.5)
nRBC: 0 % (ref 0.0–0.2)

## 2022-11-23 LAB — HEPATIC FUNCTION PANEL
ALT: 21 U/L (ref 0–44)
AST: 48 U/L — ABNORMAL HIGH (ref 15–41)
Albumin: 3.6 g/dL (ref 3.5–5.0)
Alkaline Phosphatase: 167 U/L — ABNORMAL HIGH (ref 38–126)
Bilirubin, Direct: 0.2 mg/dL (ref 0.0–0.2)
Indirect Bilirubin: 0.8 mg/dL (ref 0.3–0.9)
Total Bilirubin: 1 mg/dL (ref 0.3–1.2)
Total Protein: 7.9 g/dL (ref 6.5–8.1)

## 2022-11-23 LAB — BASIC METABOLIC PANEL
Anion gap: 11 (ref 5–15)
BUN: 20 mg/dL (ref 8–23)
CO2: 24 mmol/L (ref 22–32)
Calcium: 9.6 mg/dL (ref 8.9–10.3)
Chloride: 98 mmol/L (ref 98–111)
Creatinine, Ser: 1 mg/dL (ref 0.44–1.00)
GFR, Estimated: 56 mL/min — ABNORMAL LOW (ref >60)
Glucose, Bld: 136 mg/dL — ABNORMAL HIGH (ref 70–99)
Potassium: 3.3 mmol/L — ABNORMAL LOW (ref 3.5–5.1)
Sodium: 133 mmol/L — ABNORMAL LOW (ref 135–145)

## 2022-11-23 LAB — CBG MONITORING, ED: Glucose-Capillary: 127 mg/dL — ABNORMAL HIGH (ref 70–99)

## 2022-11-23 LAB — URINALYSIS, W/ REFLEX TO CULTURE (INFECTION SUSPECTED)
Bilirubin Urine: NEGATIVE
Glucose, UA: NEGATIVE mg/dL
Ketones, ur: NEGATIVE mg/dL
Leukocytes,Ua: NEGATIVE
Nitrite: NEGATIVE
Protein, ur: 30 mg/dL — AB
Specific Gravity, Urine: 1.018 (ref 1.005–1.030)
pH: 5 (ref 5.0–8.0)

## 2022-11-23 LAB — TROPONIN I (HIGH SENSITIVITY)
Troponin I (High Sensitivity): 158 ng/L (ref <18)
Troponin I (High Sensitivity): 250 ng/L (ref <18)
Troponin I (High Sensitivity): 330 ng/L (ref <18)

## 2022-11-23 LAB — SARS CORONAVIRUS 2 BY RT PCR: SARS Coronavirus 2 by RT PCR: NEGATIVE

## 2022-11-23 MED ORDER — SODIUM CHLORIDE 0.9 % IV BOLUS
500.0000 mL | Freq: Once | INTRAVENOUS | Status: AC
  Administered 2022-11-23: 500 mL via INTRAVENOUS

## 2022-11-23 MED ORDER — POLYETHYLENE GLYCOL 3350 17 G PO PACK
17.0000 g | PACK | Freq: Every day | ORAL | Status: DC | PRN
  Administered 2022-12-01: 17 g via ORAL
  Filled 2022-11-23 (×3): qty 1

## 2022-11-23 MED ORDER — PANTOPRAZOLE SODIUM 40 MG PO TBEC: 40.0000 mg | DELAYED_RELEASE_TABLET | Freq: Every day | ORAL | Status: DC

## 2022-11-23 MED ORDER — ACETAMINOPHEN 650 MG RE SUPP: 650.0000 mg | Freq: Four times a day (QID) | RECTAL | Status: DC | PRN

## 2022-11-23 MED ORDER — METOPROLOL SUCCINATE ER 50 MG PO TB24
50.0000 mg | ORAL_TABLET | Freq: Every day | ORAL | Status: DC
  Administered 2022-11-23 – 2022-11-24 (×2): 50 mg via ORAL
  Filled 2022-11-23 (×2): qty 1

## 2022-11-23 MED ORDER — METOPROLOL TARTRATE 5 MG/5ML IV SOLN
5.0000 mg | Freq: Once | INTRAVENOUS | Status: AC
  Administered 2022-11-23: 5 mg via INTRAVENOUS
  Filled 2022-11-23: qty 5

## 2022-11-23 MED ORDER — ONDANSETRON HCL 4 MG PO TABS: 4.0000 mg | ORAL_TABLET | Freq: Four times a day (QID) | ORAL | Status: DC | PRN

## 2022-11-23 MED ORDER — APIXABAN 2.5 MG PO TABS
2.5000 mg | ORAL_TABLET | Freq: Two times a day (BID) | ORAL | Status: DC
  Administered 2022-11-23 – 2022-11-24 (×3): 2.5 mg via ORAL
  Filled 2022-11-23 (×3): qty 1

## 2022-11-23 MED ORDER — AMLODIPINE BESYLATE 10 MG PO TABS
10.0000 mg | ORAL_TABLET | Freq: Every day | ORAL | Status: DC
  Administered 2022-11-23 – 2022-11-24 (×2): 10 mg via ORAL
  Filled 2022-11-23 (×2): qty 1

## 2022-11-23 MED ORDER — POTASSIUM CHLORIDE CRYS ER 20 MEQ PO TBCR: 40.0000 meq | EXTENDED_RELEASE_TABLET | Freq: Once | ORAL | Status: DC

## 2022-11-23 MED ORDER — ONDANSETRON HCL 4 MG/2ML IJ SOLN: 4.0000 mg | Freq: Four times a day (QID) | INTRAMUSCULAR | Status: DC | PRN

## 2022-11-23 MED ORDER — ACETAMINOPHEN 325 MG PO TABS
650.0000 mg | ORAL_TABLET | Freq: Four times a day (QID) | ORAL | Status: DC | PRN
  Administered 2022-11-28 – 2022-11-30 (×2): 650 mg via ORAL
  Filled 2022-11-23 (×2): qty 2

## 2022-11-23 MED ORDER — ENSURE SURGERY PO LIQD
237.0000 mL | Freq: Two times a day (BID) | ORAL | Status: DC
  Administered 2022-11-24 – 2022-11-30 (×10): 237 mL via ORAL
  Filled 2022-11-23 (×15): qty 237

## 2022-11-23 MED ORDER — ALBUTEROL SULFATE (2.5 MG/3ML) 0.083% IN NEBU: 2.5000 mg | INHALATION_SOLUTION | RESPIRATORY_TRACT | Status: DC | PRN

## 2022-11-23 MED ORDER — TRAZODONE HCL 50 MG PO TABS
25.0000 mg | ORAL_TABLET | Freq: Every evening | ORAL | Status: DC | PRN
  Administered 2022-11-28 – 2022-12-02 (×5): 25 mg via ORAL
  Filled 2022-11-23 (×6): qty 1

## 2022-11-23 MED ORDER — POTASSIUM CHLORIDE CRYS ER 20 MEQ PO TBCR
20.0000 meq | EXTENDED_RELEASE_TABLET | Freq: Every day | ORAL | Status: DC
  Administered 2022-11-23 – 2022-11-24 (×2): 20 meq via ORAL
  Filled 2022-11-23 (×2): qty 1

## 2022-11-23 MED ORDER — DOCUSATE SODIUM 100 MG PO CAPS
100.0000 mg | ORAL_CAPSULE | Freq: Two times a day (BID) | ORAL | Status: DC
  Administered 2022-11-23 – 2022-12-03 (×12): 100 mg via ORAL
  Filled 2022-11-23 (×17): qty 1

## 2022-11-23 NOTE — ED Provider Notes (Signed)
Emergency Department Provider Note   I have reviewed the triage vital signs and the nursing notes.   HISTORY  Chief Complaint Weakness   HPI Shannon Ortiz is a 87 y.o. female with PMH reviewed below including pancreatic cancer presents to the ED with family for increased confusion, generalized weakness, and poor appetite.  Patient is accompanied by her grandson and I spoke with her caregiver (daughter) by phone.  I have noticed progressive decline over the past 1 to 2 weeks.  She has had increased confusion regarding her age and the current year.  Her appetite has been less.  She continues to take her home medications with no recent changes.  No fevers but family states she seemed cold at times.  Patient completed a short course of chemotherapy for pancreatic cancer in late 2023 and is not currently on any chemotherapy or radiation treatments, family state due to age. Patient denies any pain at this time. Can feel that maybe her HR is elevated but not a prominent symptom for her.    Past Medical History:  Diagnosis Date   Acquired diverticulum of distal esophagus with dysphagia 10/03/2018   AKI (acute kidney injury) ([facility]) 08/31/2018   Anxiety    Bradycardia 02/16/2014   Chest tightness    DDD (degenerative disc disease), lumbar    DJD (degenerative joint disease)    Dry eyes    Esophageal stenosis 08/31/2018   Facial pain    Fibromyalgia    Hemorrhoid    Hypertension    Hypokalemia 08/31/2018   Hypothyroidism    Lightheadedness    Near syncope 02/16/2014   Osteopenia    Sigmoid diverticulosis    Thyroid nodule    PT DENIES    Review of Systems  Constitutional: No fever/chills. Positive weakness.  Cardiovascular: Denies chest pain. Positive palpitations.  Respiratory: Denies shortness of breath. Gastrointestinal: No abdominal pain.  No nausea, no vomiting.  No diarrhea.  No constipation. Poor appetite overall.  Genitourinary: Negative for dysuria. Musculoskeletal: Negative  for back pain. Skin: Negative for rash. Neurological: Negative for headaches, focal weakness or numbness. Positive confusion per family.   ____________________________________________   PHYSICAL EXAM:  VITAL SIGNS: ED Triage Vitals  Enc Vitals Group     BP 11/23/22 0810 (!) 156/106     Pulse Rate 11/23/22 0810 (!) 146     Resp 11/23/22 0810 16     Temp 11/23/22 0810 99.8 F (37.7 C)     Temp Source 11/23/22 0810 Oral     SpO2 11/23/22 0810 95 %     Weight 11/23/22 0809 121 lb (54.9 kg)     Height 11/23/22 0809 5\' 6"  (1.676 m)   Constitutional: Alert and conversational. Well appearing and in no acute distress. Eyes: Conjunctivae are normal.  Head: Atraumatic. Nose: No congestion/rhinnorhea. Mouth/Throat: Mucous membranes are moist. Neck: No stridor.  Cardiovascular: A fib RVR on monitor. Good peripheral circulation. Grossly normal heart sounds.   Respiratory: Normal respiratory effort.  No retractions. Lungs CTAB. Gastrointestinal: Soft and nontender. No distention.  Musculoskeletal: No lower extremity tenderness nor edema. No gross deformities of extremities. Neurologic:  Normal speech and language. No gross focal neurologic deficits are appreciated.  Skin:  Skin is warm, dry and intact. No rash noted.   ____________________________________________   LABS (all labs ordered are listed, but only abnormal results are displayed)  Labs Reviewed  BASIC METABOLIC PANEL - Abnormal; Notable for the following components:      Result Value  Sodium 133 (*)    Potassium 3.3 (*)    Glucose, Bld 136 (*)    GFR, Estimated 56 (*)    All other components within normal limits  CBC - Abnormal; Notable for the following components:   WBC 13.6 (*)    Hemoglobin 10.8 (*)    HCT 33.1 (*)    MCV 72.6 (*)    MCH 23.7 (*)    RDW 16.5 (*)    Platelets 131 (*)    All other components within normal limits  HEPATIC FUNCTION PANEL - Abnormal; Notable for the following components:   AST 48  (*)    Alkaline Phosphatase 167 (*)    All other components within normal limits  CBG MONITORING, ED - Abnormal; Notable for the following components:   Glucose-Capillary 127 (*)    All other components within normal limits  TROPONIN I (HIGH SENSITIVITY) - Abnormal; Notable for the following components:   Troponin I (High Sensitivity) 158 (*)    All other components within normal limits  SARS CORONAVIRUS 2 BY RT PCR  URINALYSIS, W/ REFLEX TO CULTURE (INFECTION SUSPECTED)   ____________________________________________  EKG   EKG Interpretation  Date/Time:  Monday November 23 2022 08:14:26 EDT Ventricular Rate:  134 PR Interval:    QRS Duration: 121 QT Interval:  318 QTC Calculation: 475 R Axis:   -60 Text Interpretation: Atrial flutter with predominant 2:1 AV block Left bundle branch block Baseline wander in lead(s) III V2 Confirmed by Alona Bene [phone]) on 11/23/2022 8:36:39 AM       Repeat:  EKG Interpretation  Date/Time:  Monday November 23 2022 10:16:13 EDT Ventricular Rate:  70 PR Interval:  171 QRS Duration: 124 QT Interval:  418 QTC Calculation: 451 R Axis:   -24 Text Interpretation: Sinus rhythm Left bundle branch block Now in sinus rhythm Confirmed by Alona Bene [phone]) on 11/23/2022 10:43:04 AM        ____________________________________________  RADIOLOGY  CT Head Wo Contrast  Result Date: 11/23/2022 CLINICAL DATA:  Provided history: Mental status change, unknown cause. EXAM: CT HEAD WITHOUT CONTRAST TECHNIQUE: Contiguous axial images were obtained from the base of the skull through the vertex without intravenous contrast. RADIATION DOSE REDUCTION: This exam was performed according to the departmental dose-optimization program which includes automated exposure control, adjustment of the mA and/or kV according to patient size and/or use of iterative reconstruction technique. COMPARISON:  No pertinent prior exams available for comparison. FINDINGS: Brain: Mild  cerebral atrophy. There is no acute intracranial hemorrhage. No demarcated cortical infarct. No extra-axial fluid collection. No evidence of an intracranial mass. No midline shift. Vascular: No hyperdense vessel.  Atherosclerotic calcifications. Skull: No fracture or aggressive osseous lesion. Sinuses/Orbits: No mass or acute finding within the imaged orbits. Tiny mucous retention cyst within the right maxillary sinus. Minimal mucosal thickening within the left maxillary sinus and bilateral ethmoid sinuses. IMPRESSION: 1. No evidence of an acute intracranial abnormality. 2. Mild cerebral atrophy. Electronically Signed   By: Jackey Loge D.O.   On: 11/23/2022 09:54   DG Chest 2 View  Result Date: 11/23/2022 CLINICAL DATA:  Weakness. EXAM: CHEST - 2 VIEW COMPARISON:  03/07/2019. FINDINGS: Left chest dual-chamber pacemaker with leads projecting over the right atrium and ventricle. Low lung volumes accentuate the pulmonary vasculature and cardiomediastinal silhouette. No consolidation or pulmonary edema. Stable cardiomegaly and mediastinal contours with tortuous aorta. No pleural effusion or pneumothorax. IMPRESSION: No evidence of acute cardiopulmonary disease. Electronically Signed   By: Zollie Beckers  Wiggins M.D.   On: 11/23/2022 09:23    ____________________________________________   PROCEDURES  Procedure(s) performed:   Procedures  CRITICAL CARE Performed by: Maia Plan Total critical care time: 35 minutes Critical care time was exclusive of separately billable procedures and treating other patients. Critical care was necessary to treat or prevent imminent or life-threatening deterioration. Critical care was time spent personally by me on the following activities: development of treatment plan with patient and/or surrogate as well as nursing, discussions with consultants, evaluation of patient's response to treatment, examination of patient, obtaining history from patient or surrogate, ordering and  performing treatments and interventions, ordering and review of laboratory studies, ordering and review of radiographic studies, pulse oximetry and re-evaluation of patient's condition.  Alona Bene, MD Emergency Medicine  ____________________________________________   INITIAL IMPRESSION / ASSESSMENT AND PLAN / ED COURSE  Pertinent labs & imaging results that were available during my care of the patient were reviewed by me and considered in my medical decision making (see chart for details).   This patient is Presenting for Evaluation of AMS, which does require a range of treatment options, and is a complaint that involves a high risk of morbidity and mortality.  The Differential Diagnoses includes but is not exclusive to alcohol, illicit or prescription medications, intracranial pathology such as stroke, intracerebral hemorrhage, fever or infectious causes including sepsis, hypoxemia, uremia, trauma, endocrine related disorders such as diabetes, hypoglycemia, thyroid-related diseases, etc.   Critical Interventions-    Medications  metoprolol tartrate (LOPRESSOR) injection 5 mg (5 mg Intravenous Given 11/23/22 0901)  sodium chloride 0.9 % bolus 500 mL (500 mLs Intravenous New Bag/Given 11/23/22 0900)    Reassessment after intervention:  HR improved and converted to NSR.    I did obtain Additional Historical Information from daughter by phone and grandson at bedside.   I decided to review pertinent External Data, and in summary patient last see by Dr. Mosetta Putt in 05/2022. Staging not performed after discussion with family and considering patient's age.    Clinical Laboratory Tests Ordered, included CBC with leukocytosis. Mild anemia. No transfusion at this time. COVID negative. Troponin mildly elevated to 158, likely related to demand ischemia. No AKI.   Radiologic Tests Ordered, included CXR and CT head. I independently interpreted the images and agree with radiology interpretation.    Cardiac Monitor Tracing which shows A fib with RVR.   Social Determinants of Health Risk patient is a non-smoker.   Consult complete with [facility] with plan for admit.     Medical Decision Making: Summary:  Patient presents emergency department generalized weakness, confusion, poor appetite.  She arrives with heart rate in the 130-140 range with minimal symptoms.  This could be contributing to symptoms but also considering that this may be compensatory for some underlying process (ie sepsis).  Will give extra dose of IV metoprolol but blood pressures are actually high, mental status is okay.  Plan to not aggressively rate control or rhythm control at this time.   Reevaluation with update and discussion with patient. Converted to NSR. Continue to trend troponin. Doubt NSTEMI and suspect demand ischemia clinically.   Patient's presentation is most consistent with acute presentation with potential threat to life or bodily function.   Disposition: admit  ____________________________________________  FINAL CLINICAL IMPRESSION(S) / ED DIAGNOSES  Final diagnoses:  Generalized weakness  Atrial fibrillation with RVR (HCC)    Note:  This document was prepared using Dragon voice recognition software and may include unintentional dictation errors.  Alona Bene, MD, [facility] Emergency Medicine    Bentley Fissel, Arlyss Repress, MD 11/23/22 [phone]

## 2022-11-23 NOTE — ED Triage Notes (Signed)
Pt c/o increasing weakness and confusion x2 weeks.  Hx of pancreatic CA.  Last treatment in December 2023.

## 2022-11-23 NOTE — ED Notes (Signed)
ED TO INPATIENT HANDOFF REPORT  Name/Age/Gender Shannon Ortiz 87 y.o. female  Code Status    Code Status Orders  (From admission, onward)           Start     Ordered   11/23/22 1116  Full code  Continuous       Question:  By:  Answer:  Consent: discussion documented in EHR   11/23/22 1116           Code Status History     Date Active Date Inactive Code Status Order ID Comments User Context   03/06/2019 1650 03/07/2019 1431 Full Code [id number]  Marinus Maw, MD Inpatient   12/27/2018 0223 12/27/2018 1941 Full Code [id number]  Eduard Clos, MD Inpatient   10/25/2018 1329 10/28/2018 1723 Full Code [id number]  Karie Soda, MD Inpatient   10/15/2018 1609 10/17/2018 1740 Full Code [id number]  Jonah Blue, MD ED   08/31/2018 1834 09/02/2018 1326 Full Code [id number]  Charlsie Quest, MD ED   02/16/2014 2225 02/17/2014 1959 Full Code [id number]  Eduard Clos, MD Inpatient       Home/SNF/Other Home  Chief Complaint Atrial fibrillation with rapid ventricular response (HCC) [I48.91]  Level of Care/Admitting Diagnosis ED Disposition     ED Disposition  Admit   Condition  --   Comment  Hospital Area: [facility] [[id number]]  Level of Care: Telemetry [5]  Admit to tele based on following criteria: Complex arrhythmia (Bradycardia/Tachycardia)  May place patient in observation at [facility] or [facility] if equivalent level of care is available:: Yes  Covid Evaluation: Asymptomatic - no recent exposure (last 10 days) testing not required  Diagnosis: Atrial fibrillation with rapid ventricular response [facility]) [[id number]]  Admitting Physician: Maryln Gottron [[id number]]  Attending Physician: [facility], MIR [email or web address] [[id number]]          Medical History Past Medical History:  Diagnosis Date   Acquired diverticulum of distal esophagus with dysphagia 10/03/2018   AKI (acute kidney injury) (HCC) 08/31/2018   Anxiety    Bradycardia 02/16/2014   Chest tightness     DDD (degenerative disc disease), lumbar    DJD (degenerative joint disease)    Dry eyes    Esophageal stenosis 08/31/2018   Facial pain    Fibromyalgia    Hemorrhoid    Hypertension    Hypokalemia 08/31/2018   Hypothyroidism    Lightheadedness    Near syncope 02/16/2014   Osteopenia    Sigmoid diverticulosis    Thyroid nodule    PT DENIES    Allergies Allergies  Allergen Reactions   Ace Inhibitors Cough   Elavil [Amitriptyline Hcl] Hives   Ketek [Telithromycin] Hives    IV Location/Drains/Wounds Patient Lines/Drains/Airways Status     Active Line/Drains/Airways     Name Placement date Placement time Site Days   Peripheral IV 11/23/22 20 G 1" Anterior;Left Forearm 11/23/22  0846  Forearm  less than 1   Incision (Closed) 03/06/19 Chest Left 03/06/19  1620  -- 1358            Labs/Imaging Results for orders placed or performed during the hospital encounter of 11/23/22 (from the past 48 hour(s))  CBG monitoring, ED     Status: Abnormal   Collection Time: 11/23/22  8:30 AM  Result Value Ref Range   Glucose-Capillary 127 (H) 70 - 99 mg/dL    Comment: Glucose reference range applies only to samples taken after fasting for at least 8 hours.  Basic metabolic panel     Status: Abnormal   Collection Time: 11/23/22  8:39 AM  Result Value Ref Range   Sodium 133 (L) 135 - 145 mmol/L   Potassium 3.3 (L) 3.5 - 5.1 mmol/L   Chloride 98 98 - 111 mmol/L   CO2 24 22 - 32 mmol/L   Glucose, Bld 136 (H) 70 - 99 mg/dL    Comment: Glucose reference range applies only to samples taken after fasting for at least 8 hours.   BUN 20 8 - 23 mg/dL   Creatinine, Ser 8.29 0.44 - 1.00 mg/dL   Calcium 9.6 8.9 - 56.2 mg/dL   GFR, Estimated 56 (L) >60 mL/min    Comment: (NOTE) Calculated using the CKD-EPI Creatinine Equation (2021)    Anion gap 11 5 - 15    Comment: Performed at [facility], [address] [place], [place] [address]  CBC     Status: Abnormal    Collection Time: 11/23/22  8:39 AM  Result Value Ref Range   WBC 13.6 (H) 4.0 - 10.5 K/uL   RBC 4.56 3.87 - 5.11 MIL/uL   Hemoglobin 10.8 (L) 12.0 - 15.0 g/dL   HCT 57.8 (L) 46.9 - 62.9 %   MCV 72.6 (L) 80.0 - 100.0 fL   MCH 23.7 (L) 26.0 - 34.0 pg   MCHC 32.6 30.0 - 36.0 g/dL   RDW 52.8 (H) 41.3 - 24.4 %   Platelets 131 (L) 150 - 400 K/uL   nRBC 0.0 0.0 - 0.2 %    Comment: Performed at [facility], [address], [place], [place] [address]  Hepatic function panel     Status: Abnormal   Collection Time: 11/23/22  8:39 AM  Result Value Ref Range   Total Protein 7.9 6.5 - 8.1 g/dL   Albumin 3.6 3.5 - 5.0 g/dL   AST 48 (H) 15 - 41 U/L   ALT 21 0 - 44 U/L   Alkaline Phosphatase 167 (H) 38 - 126 U/L   Total Bilirubin 1.0 0.3 - 1.2 mg/dL   Bilirubin, Direct 0.2 0.0 - 0.2 mg/dL   Indirect Bilirubin 0.8 0.3 - 0.9 mg/dL    Comment: Performed at [facility], [address] [place], [place] [address]  Troponin I (High Sensitivity)     Status: Abnormal   Collection Time: 11/23/22  8:39 AM  Result Value Ref Range   Troponin I (High Sensitivity) 158 (HH) <18 ng/L    Comment: CRITICAL RESULT CALLED TO, READ BACK BY AND VERIFIED WITH CHENEY A. RN AT [phone] ON 11/23/2022 BY MECIAL J. (NOTE) Elevated high sensitivity troponin I (hsTnI) values and significant  changes across serial measurements may suggest ACS but many other  chronic and acute conditions are known to elevate hsTnI results.  Refer to the "Links" section for chest pain algorithms and additional  guidance. Performed at [facility], [address], [place], [place] [address]   SARS Coronavirus 2 by RT PCR (hospital order, performed in [facility] hospital lab) *cepheid single result test* Anterior Nasal Swab     Status: None   Collection Time: 11/23/22  9:04 AM   Specimen: Anterior Nasal Swab  Result Value Ref Range   SARS Coronavirus 2 by RT PCR NEGATIVE NEGATIVE     Comment: (NOTE) SARS-CoV-2 target nucleic acids are NOT DETECTED.  The SARS-CoV-2 RNA is generally detectable in upper and lower respiratory specimens during the acute phase of infection.  The lowest concentration of SARS-CoV-2 viral copies this assay can detect is 250 copies / mL. A negative result does not preclude SARS-CoV-2 infection and should not be used as the sole basis for treatment or other patient management decisions.  A negative result may occur with improper specimen collection / handling, submission of specimen other than nasopharyngeal swab, presence of viral mutation(s) within the areas targeted by this assay, and inadequate number of viral copies (<250 copies / mL). A negative result must be combined with clinical observations, patient history, and epidemiological information.  Fact Sheet for Patients:   RoadLapTop.co.za  Fact Sheet for Healthcare Providers: http://kim-miller.com/  This test is not yet approved or  cleared by the [place] FDA and has been authorized for detection and/or diagnosis of SARS-CoV-2 by FDA under an Emergency Use Authorization (EUA).  This EUA will remain in effect (meaning this test can be used) for the duration of the COVID-19 declaration under Section 564(b)(1) of the Act, 21 U.S.C. section 360bbb-3(b)(1), unless the authorization is terminated or revoked sooner.  Performed at [facility], [address], [place], [place] [address]   Urinalysis, w/ Reflex to Culture (Infection Suspected) -Urine, Clean Catch     Status: Abnormal   Collection Time: 11/23/22 10:37 AM  Result Value Ref Range   Specimen Source URINE, CLEAN CATCH    Color, Urine YELLOW YELLOW   APPearance CLEAR CLEAR   Specific Gravity, Urine 1.018 1.005 - 1.030   pH 5.0 5.0 - 8.0   Glucose, UA NEGATIVE NEGATIVE mg/dL   Hgb urine dipstick SMALL (A) NEGATIVE   Bilirubin Urine NEGATIVE NEGATIVE    Ketones, ur NEGATIVE NEGATIVE mg/dL   Protein, ur 30 (A) NEGATIVE mg/dL   Nitrite NEGATIVE NEGATIVE   Leukocytes,Ua NEGATIVE NEGATIVE   RBC / HPF 0-5 0 - 5 RBC/hpf   WBC, UA 0-5 0 - 5 WBC/hpf    Comment:        Reflex urine culture not performed if WBC <=10, OR if Squamous epithelial cells >5. If Squamous epithelial cells >5 suggest recollection.    Bacteria, UA RARE (A) NONE SEEN   Squamous Epithelial / HPF 0-5 0 - 5 /HPF   Mucus PRESENT    Hyaline Casts, UA PRESENT     Comment: Performed at [facility], [address] [place], [place] [address]   CT Head Wo Contrast  Result Date: 11/23/2022 CLINICAL DATA:  Provided history: Mental status change, unknown cause. EXAM: CT HEAD WITHOUT CONTRAST TECHNIQUE: Contiguous axial images were obtained from the base of the skull through the vertex without intravenous contrast. RADIATION DOSE REDUCTION: This exam was performed according to the departmental dose-optimization program which includes automated exposure control, adjustment of the mA and/or kV according to patient size and/or use of iterative reconstruction technique. COMPARISON:  No pertinent prior exams available for comparison. FINDINGS: Brain: Mild cerebral atrophy. There is no acute intracranial hemorrhage. No demarcated cortical infarct. No extra-axial fluid collection. No evidence of an intracranial mass. No midline shift. Vascular: No hyperdense vessel.  Atherosclerotic calcifications. Skull: No fracture or aggressive osseous lesion. Sinuses/Orbits: No mass or acute finding within the imaged orbits. Tiny mucous retention cyst within the right maxillary sinus. Minimal mucosal thickening within the left maxillary sinus and bilateral ethmoid sinuses. IMPRESSION: 1. No evidence of an acute intracranial abnormality. 2. Mild cerebral atrophy. Electronically Signed   By: Jackey Loge D.O.   On: 11/23/2022 09:54   DG Chest 2 View  Result  Date: 11/23/2022 CLINICAL DATA:   Weakness. EXAM: CHEST - 2 VIEW COMPARISON:  03/07/2019. FINDINGS: Left chest dual-chamber pacemaker with leads projecting over the right atrium and ventricle. Low lung volumes accentuate the pulmonary vasculature and cardiomediastinal silhouette. No consolidation or pulmonary edema. Stable cardiomegaly and mediastinal contours with tortuous aorta. No pleural effusion or pneumothorax. IMPRESSION: No evidence of acute cardiopulmonary disease. Electronically Signed   By: Orvan Falconer M.D.   On: 11/23/2022 09:23    Pending Labs Unresulted Labs (From admission, onward)     Start     Ordered   11/24/22 0500  Basic metabolic panel  Tomorrow morning,   R        11/23/22 1116   11/24/22 0500  CBC  Tomorrow morning,   R        11/23/22 1116            Vitals/Pain Today's Vitals   11/23/22 0810 11/23/22 0817 11/23/22 0900 11/23/22 0930  BP: (!) 156/106  (!) 138/90 132/86  Pulse: (!) 146  (!) 126 (!) 106  Resp: 16  (!) 22 20  Temp: 99.8 F (37.7 C)     TempSrc: Oral     SpO2: 95%  100% 100%  Weight:      Height:      PainSc: 0-No pain 0-No pain      Isolation Precautions Airborne and Contact precautions  Medications Medications  amLODipine (NORVASC) tablet 10 mg (has no administration in time range)  metoprolol succinate (TOPROL-XL) 24 hr tablet 50 mg (has no administration in time range)  pantoprazole (PROTONIX) EC tablet 40 mg (has no administration in time range)  apixaban (ELIQUIS) tablet 2.5 mg (has no administration in time range)  feeding supplement (ENSURE SURGERY) liquid 237 mL (has no administration in time range)  potassium chloride SA (KLOR-CON M) CR tablet 20 mEq (has no administration in time range)  acetaminophen (TYLENOL) tablet 650 mg (has no administration in time range)    Or  acetaminophen (TYLENOL) suppository 650 mg (has no administration in time range)  traZODone (DESYREL) tablet 25 mg (has no administration in time range)  ondansetron (ZOFRAN) tablet 4 mg  (has no administration in time range)    Or  ondansetron (ZOFRAN) injection 4 mg (has no administration in time range)  albuterol (PROVENTIL) (2.5 MG/3ML) 0.083% nebulizer solution 2.5 mg (has no administration in time range)  docusate sodium (COLACE) capsule 100 mg (has no administration in time range)  polyethylene glycol (MIRALAX / GLYCOLAX) packet 17 g (has no administration in time range)  metoprolol tartrate (LOPRESSOR) injection 5 mg (5 mg Intravenous Given 11/23/22 0901)  sodium chloride 0.9 % bolus 500 mL (500 mLs Intravenous New Bag/Given 11/23/22 0900)    Mobility walks with device

## 2022-11-23 NOTE — ED Notes (Signed)
Pt ambulated to bathroom utilizing personal cane. No assistance required. Standby for safety. Steady gait. No other complaints

## 2022-11-23 NOTE — H&P (Addendum)
History and Physical  [name] DOB: [birth year] DOA: 11/23/2022  PCP: Daisy Floro, MD   Chief Complaint: weakness, palpitations   HPI: Shannon Ortiz is a 87 y.o. female with medical history significant for sinus node dysfunction with pacemaker, hypertension, atrial fibrillation on Eliquis and pancreatic cancer undergoing surveillance, being admitted to the hospital for a couple of weeks of nonspecific weakness, found to be in rapid atrial fibrillation.  History is provided by the patient, as well as my review of the chart.  States that for the last couple of weeks, she has been feeling weak in general, and from time to time she can feel her heart racing.  She denies any chest pain, dizziness, nausea, dysuria, abdominal pain, fevers or chills.  ER provider spoke with the patient's grandson, though he has left the ER.  Apparently he also mention that the patient seems slightly confused though here in the ER she does not seem confused.  ED Course: Evaluation in the emergency department revealed rapid atrial fibrillation heart rate 146, blood pressure 156/100, saturating 95% on room air.  She was given a dose of IV metoprolol, and she converted back to sinus rhythm now heart rate is in the 60s.  Lab work was done and relatively unremarkable, notable for potassium 3.3, normal renal function, WBC 14, hemoglobin 10, platelets 131.  Urinalysis is pending.  Troponin elevated at 158.   Review of Systems: Please see HPI for pertinent positives and negatives. A complete 10 system review of systems are otherwise negative.  Past Medical History:  Diagnosis Date   Acquired diverticulum of distal esophagus with dysphagia 10/03/2018   AKI (acute kidney injury) (HCC) 08/31/2018   Anxiety    Bradycardia 02/16/2014   Chest tightness    DDD (degenerative disc disease), lumbar    DJD (degenerative joint disease)    Dry eyes    Esophageal stenosis 08/31/2018   Facial pain    Fibromyalgia     Hemorrhoid    Hypertension    Hypokalemia 08/31/2018   Hypothyroidism    Lightheadedness    Near syncope 02/16/2014   Osteopenia    Sigmoid diverticulosis    Thyroid nodule    PT DENIES   Past Surgical History:  Procedure Laterality Date   ABDOMINAL HYSTERECTOMY     COMPLETE   BALLOON DILATION N/A 09/01/2018   Procedure: BALLOON DILATION;  Surgeon: Kerin Salen, MD;  Location: [facility] ENDOSCOPY;  Service: Gastroenterology;  Laterality: N/A;   BIOPSY  09/01/2018   Procedure: BIOPSY;  Surgeon: Kerin Salen, MD;  Location: [facility] ENDOSCOPY;  Service: Gastroenterology;;   BIOPSY  01/26/2022   Procedure: BIOPSY;  Surgeon: Lemar Lofty., MD;  Location: [facility] ENDOSCOPY;  Service: Gastroenterology;;   BOTOX INJECTION  09/01/2018   Procedure: BOTOX INJECTION;  Surgeon: Kerin Salen, MD;  Location: [facility] ENDOSCOPY;  Service: Gastroenterology;;   BREAST SURGERY     breast biopsy   CHOLECYSTECTOMY     ESOPHAGEAL MANOMETRY N/A 10/07/2018   Procedure: ESOPHAGEAL MANOMETRY (EM);  Surgeon: Kerin Salen, MD;  Location: [facility] ENDOSCOPY;  Service: Gastroenterology;  Laterality: N/A;   ESOPHAGOGASTRODUODENOSCOPY N/A 10/25/2018   Procedure: ESOPHAGOGASTRODUODENOSCOPY (EGD);  Surgeon: Karie Soda, MD;  Location: [facility] ORS;  Service: General;  Laterality: N/A;   ESOPHAGOGASTRODUODENOSCOPY N/A 01/26/2022   Procedure: ESOPHAGOGASTRODUODENOSCOPY (EGD);  Surgeon: Lemar Lofty., MD;  Location: [facility] ENDOSCOPY;  Service: Gastroenterology;  Laterality: N/A;   ESOPHAGOGASTRODUODENOSCOPY (EGD) WITH PROPOFOL N/A 09/01/2018   Procedure: ESOPHAGOGASTRODUODENOSCOPY (EGD) WITH PROPOFOL;  Surgeon: Kerin Salen, MD;  Location: [facility] ENDOSCOPY;  Service: Gastroenterology;  Laterality: N/A;   ESOPHAGOGASTRODUODENOSCOPY (EGD) WITH PROPOFOL N/A 12/30/2021   Procedure: ESOPHAGOGASTRODUODENOSCOPY (EGD) WITH PROPOFOL;  Surgeon: Willis Modena, MD;  Location: [facility] ENDOSCOPY;  Service: Gastroenterology;  Laterality: N/A;   EUS N/A 01/26/2022    Procedure: UPPER ENDOSCOPIC ULTRASOUND (EUS) RADIAL;  Surgeon: Lemar Lofty., MD;  Location: [facility] ENDOSCOPY;  Service: Gastroenterology;  Laterality: N/A;   FIDUCIAL MARKER PLACEMENT N/A 01/26/2022   Procedure: FIDUCIAL MARKER PLACEMENT;  Surgeon: Lemar Lofty., MD;  Location: [facility] ENDOSCOPY;  Service: Gastroenterology;  Laterality: N/A;   FINE NEEDLE ASPIRATION N/A 12/30/2021   Procedure: FINE NEEDLE ASPIRATION (FNA) LINEAR;  Surgeon: Willis Modena, MD;  Location: [facility] ENDOSCOPY;  Service: Gastroenterology;  Laterality: N/A;   FOREIGN BODY REMOVAL  01/26/2022   Procedure: FOREIGN BODY REMOVAL;  Surgeon: Meridee Score Netty Starring., MD;  Location: [facility] ENDOSCOPY;  Service: Gastroenterology;;   PACEMAKER IMPLANT N/A 03/06/2019   Procedure: PACEMAKER IMPLANT;  Surgeon: Marinus Maw, MD;  Location: [facility] INVASIVE CV LAB;  Service: Cardiovascular;  Laterality: N/A;   UPPER ESOPHAGEAL ENDOSCOPIC ULTRASOUND (EUS) Bilateral 12/30/2021   Procedure: UPPER ESOPHAGEAL ENDOSCOPIC ULTRASOUND (EUS);  Surgeon: Willis Modena, MD;  Location: [facility] ENDOSCOPY;  Service: Gastroenterology;  Laterality: Bilateral;    Social History:  reports that she has never smoked. She has never used smokeless tobacco. She reports that she does not drink alcohol and does not use drugs.   Allergies  Allergen Reactions   Ace Inhibitors Cough   Elavil [Amitriptyline Hcl] Hives   Ketek [Telithromycin] Hives    Family History  Problem Relation Age of Onset   Hypertension Mother    Diabetes Mother        non-insulin dependant   CAD Brother    Cancer Maternal Aunt    Cancer Maternal Uncle      Prior to Admission medications   Medication Sig Start Date End Date Taking? Authorizing Provider  amLODipine (NORVASC) 10 MG tablet Take 1 tablet (10 mg total) by mouth daily. Please call our office to schedule an appt for future refills. Thank you 2nd attempt. 11/18/22   Marinus Maw, MD  apixaban (ELIQUIS) 2.5 MG TABS tablet  Take 1 tablet (2.5 mg total) by mouth 2 (two) times daily. 09/09/22   Marinus Maw, MD  docusate sodium (COLACE) 100 MG capsule Take 100 mg by mouth 2 (two) times daily.    [provider]  feeding supplement (ENSURE SURGERY) LIQD Take 237 mLs by mouth 2 (two) times daily between meals. Patient taking differently: Take 237 mLs by mouth 3 (three) times a week. 10/17/18   Almon Hercules, MD  losartan (COZAAR) 100 MG tablet Take 100 mg by mouth in the morning. 01/12/19   [provider]  metoprolol succinate (TOPROL-XL) 50 MG 24 hr tablet Take 50 mg by mouth in the morning. 07/07/21   [provider]  ondansetron (ZOFRAN-ODT) 4 MG disintegrating tablet Take 4 mg by mouth every 8 (eight) hours as needed for nausea. 12/17/21   [provider]  pantoprazole (PROTONIX) 20 MG tablet Take 20 mg by mouth daily.    [provider]  pantoprazole (PROTONIX) 40 MG tablet Take 1 tablet (40 mg total) by mouth daily. 01/26/22   Mansouraty, Netty Starring., MD  polyethylene glycol (MIRALAX) 17 g packet Take 17 g by mouth daily. Patient not taking: Reported on 01/21/2022 12/28/21   Georga Kaufmann T, PA-C  potassium chloride (KLOR-CON)  20 MEQ packet 1 packet (20 meq) 2 times daily for 3 days. Then 1 packet (20 meq) daily for 2 weeks 05/15/22   Malachy Mood, MD  potassium chloride SA (KLOR-CON M) 20 MEQ tablet Take 1 tablet (20 mEq total) by mouth daily. 05/28/22   Malachy Mood, MD    Physical Exam: BP 132/86   Pulse (!) 106   Temp 99.8 F (37.7 C) (Oral)   Resp 20   Ht 5\' 6"  (1.676 m)   Wt 54.9 kg   SpO2 100%   BMI 19.53 kg/m   General: Thin elderly female resting on a stretcher in the ER, she is alert oriented x 4, pleasant and cooperative looks very comfortable. Eyes: EOMI, clear conjuctivae, white sclerea Neck: supple, no masses, trachea mildline  Cardiovascular: RRR, no murmurs or rubs, no peripheral edema  Respiratory: clear to auscultation bilaterally, no wheezes, no crackles   Abdomen: soft, nontender, nondistended, normal bowel tones heard  Skin: dry, no rashes  Musculoskeletal: no joint effusions, normal range of motion  Psychiatric: appropriate affect, normal speech  Neurologic: extraocular muscles intact, clear speech, moving all extremities with intact sensorium          Labs on Admission:  Basic Metabolic Panel: Recent Labs  Lab 11/23/22 0839  NA 133*  K 3.3*  CL 98  CO2 24  GLUCOSE 136*  BUN 20  CREATININE 1.00  CALCIUM 9.6   Liver Function Tests: Recent Labs  Lab 11/23/22 0839  AST 48*  ALT 21  ALKPHOS 167*  BILITOT 1.0  PROT 7.9  ALBUMIN 3.6   No results for input(s): "LIPASE", "AMYLASE" in the last 168 hours. No results for input(s): "AMMONIA" in the last 168 hours. CBC: Recent Labs  Lab 11/23/22 0839  WBC 13.6*  HGB 10.8*  HCT 33.1*  MCV 72.6*  PLT 131*   Cardiac Enzymes: No results for input(s): "CKTOTAL", "CKMB", "CKMBINDEX", "TROPONINI" in the last 168 hours.  BNP (last 3 results) No results for input(s): "BNP" in the last 8760 hours.  ProBNP (last 3 results) No results for input(s): "PROBNP" in the last 8760 hours.  CBG: Recent Labs  Lab 11/23/22 0830  GLUCAP 127*    Radiological Exams on Admission: CT Head Wo Contrast  Result Date: 11/23/2022 CLINICAL DATA:  Provided history: Mental status change, unknown cause. EXAM: CT HEAD WITHOUT CONTRAST TECHNIQUE: Contiguous axial images were obtained from the base of the skull through the vertex without intravenous contrast. RADIATION DOSE REDUCTION: This exam was performed according to the departmental dose-optimization program which includes automated exposure control, adjustment of the mA and/or kV according to patient size and/or use of iterative reconstruction technique. COMPARISON:  No pertinent prior exams available for comparison. FINDINGS: Brain: Mild cerebral atrophy. There is no acute intracranial hemorrhage. No demarcated cortical infarct. No extra-axial  fluid collection. No evidence of an intracranial mass. No midline shift. Vascular: No hyperdense vessel.  Atherosclerotic calcifications. Skull: No fracture or aggressive osseous lesion. Sinuses/Orbits: No mass or acute finding within the imaged orbits. Tiny mucous retention cyst within the right maxillary sinus. Minimal mucosal thickening within the left maxillary sinus and bilateral ethmoid sinuses. IMPRESSION: 1. No evidence of an acute intracranial abnormality. 2. Mild cerebral atrophy. Electronically Signed   By: Jackey Loge D.O.   On: 11/23/2022 09:54   DG Chest 2 View  Result Date: 11/23/2022 CLINICAL DATA:  Weakness. EXAM: CHEST - 2 VIEW COMPARISON:  03/07/2019. FINDINGS: Left chest dual-chamber pacemaker with leads projecting over the right  atrium and ventricle. Low lung volumes accentuate the pulmonary vasculature and cardiomediastinal silhouette. No consolidation or pulmonary edema. Stable cardiomegaly and mediastinal contours with tortuous aorta. No pleural effusion or pneumothorax. IMPRESSION: No evidence of acute cardiopulmonary disease. Electronically Signed   By: Orvan Falconer M.D.   On: 11/23/2022 09:23    Assessment/Plan This is a pleasant 87 year old female with a history of pancreatic cancer, hypertension, sinus node disease with pacemaker, paroxysmal atrial fibrillation on Eliquis being admitted to the hospital with a couple weeks of weakness, found to be in rapid A-fib.  She has now converted back to sinus rhythm with a dose of IV metoprolol in the ER.  Weakness could be due to symptomatic paroxysmal atrial fibrillation as she has been feeling palpitations intermittently for the last couple of weeks, though infection is also possibility as she does have a leukocytosis.  Paroxysmal atrial fibrillation presented with RVR-now converted to sinus rhythm after IV metoprolol in the ER, continue home Toprol-XL, and Eliquis for stroke prophylaxis.   Elevated troponin-without EKG changes,  or chest pain.  Likely troponin leak due to atrial fibrillation induced tachycardia.  Monitor on telemetry, and trend troponin. Cardiology consulted as Troponin is rising now up to 250.  Hypertension-Toprol-XL as above, and home amlodipine  Leukocytosis-could be evidence of an ongoing infection, chest x-ray unremarkable and no pulmonary complaints.  Follow-up urinalysis, consider empiric antibiotics if evidence of infection.  Chronic anemia-likely anemia of chronic disease, and in the setting of her known pancreatic malignancy.  This is stable.  DVT prophylaxis: Eliquis     Code Status: Full Code  Consults called: None  Admission status: Observation  Time spent: 46 minutes  Kentaro Alewine Sharlette Dense MD [facility] Pager [phone]  If 7PM-7AM, please contact night-coverage [email or web address] Password [facility]  11/23/2022, 11:16 AM

## 2022-11-24 ENCOUNTER — Observation Stay (HOSPITAL_COMMUNITY): Payer: Medicare Other

## 2022-11-24 ENCOUNTER — Inpatient Hospital Stay (HOSPITAL_COMMUNITY): Payer: Medicare Other

## 2022-11-24 ENCOUNTER — Encounter (HOSPITAL_COMMUNITY): Payer: Self-pay | Admitting: Internal Medicine

## 2022-11-24 DIAGNOSIS — I495 Sick sinus syndrome: Secondary | ICD-10-CM | POA: Diagnosis present

## 2022-11-24 DIAGNOSIS — I4891 Unspecified atrial fibrillation: Secondary | ICD-10-CM | POA: Diagnosis present

## 2022-11-24 DIAGNOSIS — R410 Disorientation, unspecified: Secondary | ICD-10-CM | POA: Diagnosis not present

## 2022-11-24 DIAGNOSIS — R4182 Altered mental status, unspecified: Secondary | ICD-10-CM | POA: Diagnosis present

## 2022-11-24 DIAGNOSIS — Z681 Body mass index (BMI) 19 or less, adult: Secondary | ICD-10-CM | POA: Diagnosis not present

## 2022-11-24 DIAGNOSIS — E785 Hyperlipidemia, unspecified: Secondary | ICD-10-CM | POA: Diagnosis present

## 2022-11-24 DIAGNOSIS — D6869 Other thrombophilia: Secondary | ICD-10-CM | POA: Diagnosis present

## 2022-11-24 DIAGNOSIS — I480 Paroxysmal atrial fibrillation: Secondary | ICD-10-CM | POA: Diagnosis present

## 2022-11-24 DIAGNOSIS — I429 Cardiomyopathy, unspecified: Secondary | ICD-10-CM | POA: Diagnosis not present

## 2022-11-24 DIAGNOSIS — Z1152 Encounter for screening for COVID-19: Secondary | ICD-10-CM | POA: Diagnosis not present

## 2022-11-24 DIAGNOSIS — I639 Cerebral infarction, unspecified: Secondary | ICD-10-CM | POA: Diagnosis not present

## 2022-11-24 DIAGNOSIS — R079 Chest pain, unspecified: Secondary | ICD-10-CM

## 2022-11-24 DIAGNOSIS — D696 Thrombocytopenia, unspecified: Secondary | ICD-10-CM | POA: Diagnosis not present

## 2022-11-24 DIAGNOSIS — Z7189 Other specified counseling: Secondary | ICD-10-CM | POA: Diagnosis not present

## 2022-11-24 DIAGNOSIS — Z950 Presence of cardiac pacemaker: Secondary | ICD-10-CM | POA: Diagnosis not present

## 2022-11-24 DIAGNOSIS — R4701 Aphasia: Secondary | ICD-10-CM | POA: Diagnosis not present

## 2022-11-24 DIAGNOSIS — I2489 Other forms of acute ischemic heart disease: Secondary | ICD-10-CM | POA: Diagnosis present

## 2022-11-24 DIAGNOSIS — E43 Unspecified severe protein-calorie malnutrition: Secondary | ICD-10-CM | POA: Diagnosis present

## 2022-11-24 DIAGNOSIS — I472 Ventricular tachycardia, unspecified: Secondary | ICD-10-CM | POA: Diagnosis not present

## 2022-11-24 DIAGNOSIS — I10 Essential (primary) hypertension: Secondary | ICD-10-CM | POA: Diagnosis present

## 2022-11-24 DIAGNOSIS — M797 Fibromyalgia: Secondary | ICD-10-CM | POA: Diagnosis present

## 2022-11-24 DIAGNOSIS — Z515 Encounter for palliative care: Secondary | ICD-10-CM | POA: Diagnosis not present

## 2022-11-24 DIAGNOSIS — Z7901 Long term (current) use of anticoagulants: Secondary | ICD-10-CM | POA: Diagnosis not present

## 2022-11-24 DIAGNOSIS — D509 Iron deficiency anemia, unspecified: Secondary | ICD-10-CM | POA: Diagnosis present

## 2022-11-24 DIAGNOSIS — E876 Hypokalemia: Secondary | ICD-10-CM | POA: Diagnosis not present

## 2022-11-24 DIAGNOSIS — E039 Hypothyroidism, unspecified: Secondary | ICD-10-CM | POA: Diagnosis present

## 2022-11-24 DIAGNOSIS — E871 Hypo-osmolality and hyponatremia: Secondary | ICD-10-CM | POA: Diagnosis not present

## 2022-11-24 DIAGNOSIS — I63443 Cerebral infarction due to embolism of bilateral cerebellar arteries: Secondary | ICD-10-CM | POA: Diagnosis not present

## 2022-11-24 DIAGNOSIS — R531 Weakness: Secondary | ICD-10-CM | POA: Diagnosis not present

## 2022-11-24 DIAGNOSIS — C259 Malignant neoplasm of pancreas, unspecified: Secondary | ICD-10-CM | POA: Diagnosis present

## 2022-11-24 DIAGNOSIS — R1310 Dysphagia, unspecified: Secondary | ICD-10-CM | POA: Diagnosis not present

## 2022-11-24 DIAGNOSIS — D638 Anemia in other chronic diseases classified elsewhere: Secondary | ICD-10-CM | POA: Diagnosis present

## 2022-11-24 DIAGNOSIS — I6320 Cerebral infarction due to unspecified occlusion or stenosis of unspecified precerebral arteries: Secondary | ICD-10-CM | POA: Diagnosis not present

## 2022-11-24 LAB — ECHOCARDIOGRAM COMPLETE
Area-P 1/2: 3.39 cm2
Height: 66 in
MV M vel: 4.89 m/s
MV Peak grad: 95.6 mmHg
Radius: 0.3 cm
S' Lateral: 3.3 cm
Weight: 1936 oz

## 2022-11-24 LAB — TROPONIN I (HIGH SENSITIVITY)
Troponin I (High Sensitivity): 454 ng/L (ref <18)
Troponin I (High Sensitivity): 442 ng/L (ref <18)

## 2022-11-24 LAB — CBC
HCT: 28 % — ABNORMAL LOW (ref 36.0–46.0)
Hemoglobin: 9 g/dL — ABNORMAL LOW (ref 12.0–15.0)
MCH: 24.3 pg — ABNORMAL LOW (ref 26.0–34.0)
MCHC: 32.1 g/dL (ref 30.0–36.0)
MCV: 75.7 fL — ABNORMAL LOW (ref 80.0–100.0)
Platelets: 102 10*3/uL — ABNORMAL LOW (ref 150–400)
RBC: 3.7 MIL/uL — ABNORMAL LOW (ref 3.87–5.11)
RDW: 16.8 % — ABNORMAL HIGH (ref 11.5–15.5)
WBC: 8.6 10*3/uL (ref 4.0–10.5)
nRBC: 0 % (ref 0.0–0.2)

## 2022-11-24 LAB — BASIC METABOLIC PANEL
Anion gap: 7 (ref 5–15)
BUN: 17 mg/dL (ref 8–23)
CO2: 23 mmol/L (ref 22–32)
Calcium: 8.8 mg/dL — ABNORMAL LOW (ref 8.9–10.3)
Chloride: 102 mmol/L (ref 98–111)
Creatinine, Ser: 0.76 mg/dL (ref 0.44–1.00)
GFR, Estimated: >60 mL/min (ref >60)
Glucose, Bld: 97 mg/dL (ref 70–99)
Potassium: 3.7 mmol/L (ref 3.5–5.1)
Sodium: 132 mmol/L — ABNORMAL LOW (ref 135–145)

## 2022-11-24 MED ORDER — LORAZEPAM 2 MG/ML IJ SOLN
1.0000 mg | Freq: Once | INTRAMUSCULAR | Status: AC
  Administered 2022-11-24: 1 mg via INTRAVENOUS
  Filled 2022-11-24: qty 1

## 2022-11-24 MED ORDER — SODIUM CHLORIDE 0.9 % IV SOLN: INTRAVENOUS | Status: AC

## 2022-11-24 NOTE — Progress Notes (Signed)
Nurse saw patient with her legs out of the bed. Nurse entered pts room to assess situation.  Upon speaking with patient nurse observed that patient was unable to express her needs verbally and appeared confused.  A few cognition questions were asked.  Patient knew she was in the hospital and came in with weakness.  Patient knew her age and the current president but still seem to have trouble gathering her words still.  She was also able to stick her tongue out firmly side to side. Patient was able to grip and squeeze hands and raise hands and move legs equally.  Patient was assisted to walk to the bathroom without difficulty.This nurse informed rapid response and and CN for further assessment and patient's dtr [name], also called during this time she confirmed that she has noticed some forgetfulness and difficulty speaking at times with her mom.  RR nurse assessed patient and at this times there does not appear to be any new neurological issues with patient. Vitals takes and charted.

## 2022-11-24 NOTE — Significant Event (Signed)
Rapid Response Event Note   Reason for Call :  Change in cognition during bedside RN morning rounds. Per RN, she was unable to speak clearly or find her words.  Initial Focused Assessment:  Arrived to bedside and found patient to be alert, oriented x 4, able to follow all commands without unilateral deficits. Patient denies pain, shortness of breath, nausea, vision changes, or other adverse symptoms. No aphasia observed at this time.  Interventions:  Focused neurological assessment and chart review. Reassessed vitals.  Plan of Care:  Patient to remain in current level of care at this time. If any changes, please call rapid response. Provider notified of visit but no new orders obtained at this time.  Event Summary:   MD Notified: Johann Capers, NP Call Time: 0559 Arrival Time: 0602 End Time: 1610  Lamona Curl, RN

## 2022-11-24 NOTE — Progress Notes (Signed)
[facility]  PROGRESS NOTE  Shannon Ortiz ZOX:[id number] DOB: Aug 21, 1935 DOA: 11/23/2022 PCP: Daisy Floro, MD   Brief HPI:    87 y.o. female with medical history significant for sinus node dysfunction with pacemaker, hypertension, atrial fibrillation on Eliquis and pancreatic cancer undergoing surveillance, being admitted to the hospital for a couple of weeks of nonspecific weakness, found to be in rapid atrial fibrillation.  Also patient has been confused which is changed from her baseline.  CT head was unremarkable. In the ED she was given IV metoprolol and was converted back to normal sinus rhythm.  She was found to have significantly elevated troponin, cardiology was consulted.    Assessment/Plan:   Altered mental status -Unclear etiology, CT head was unremarkable -She has been getting confused over the past few days as per daughter -Will get stat MRI brain -Based on the MRI results we will consider neurology consultation  Paroxysmal atrial fibrillation with RVR -Converted back to normal sinus rhythm with metoprolol -Continue Eliquis for stroke prophylaxis  Elevated troponin -No chest pain, likely from demand ischemia from atrial fibrillation with RVR -Cardiology consulted; echocardiogram shows reduced EF ventricular hypokinesis -Cardiology will consider ischemic evaluation based on MRI results  Hypertension -Continue Toprol-XL, amlodipine      Medications     amLODipine  10 mg Oral Daily   apixaban  2.5 mg Oral BID   docusate sodium  100 mg Oral BID   feeding supplement  237 mL Oral BID BM   metoprolol succinate  50 mg Oral Daily   potassium chloride SA  20 mEq Oral Daily     Data Reviewed:   CBG:  Recent Labs  Lab 11/23/22 0830  GLUCAP 127*    SpO2: 98 %    Vitals:   11/23/22 2230 11/24/22 0413 11/24/22 0615 11/24/22 1314  BP: (!) 145/76 (!) 149/73 (!) 165/86 (!) 156/81  Pulse: 65 66 64 63  Resp: 17 17 18    Temp: 98.6 F (37 C) 99.8  F (37.7 C) 99.9 F (37.7 C) 99.4 F (37.4 C)  TempSrc: Oral Oral Oral Oral  SpO2: 98% 99% 99% 98%  Weight:      Height:          Data Reviewed:  Basic Metabolic Panel: Recent Labs  Lab 11/23/22 0839 11/24/22 0424  NA 133* 132*  K 3.3* 3.7  CL 98 102  CO2 24 23  GLUCOSE 136* 97  BUN 20 17  CREATININE 1.00 0.76  CALCIUM 9.6 8.8*    CBC: Recent Labs  Lab 11/23/22 0839 11/24/22 0424  WBC 13.6* 8.6  HGB 10.8* 9.0*  HCT 33.1* 28.0*  MCV 72.6* 75.7*  PLT 131* 102*    LFT Recent Labs  Lab 11/23/22 0839  AST 48*  ALT 21  ALKPHOS 167*  BILITOT 1.0  PROT 7.9  ALBUMIN 3.6     Antibiotics: Anti-infectives (From admission, onward)    None        DVT prophylaxis: Apixaban  Code Status: Full code  Family Communication: Discussed with patient daughter at bedside   CONSULTS cardiology   Subjective   Patient seen, following commands however has garbled speech.   Objective    Physical Examination:   General: Appears in no acute distress Cardiovascular: S1-S2, regular Respiratory: Lungs clear to auscultation bilaterally Abdomen: Soft, nontender, no organomegaly Extremities: No edema in the lower extremities Neurologic: Alert, following commands, garbled speech, moving all extremities, motor strength 5/5 in all extremities, cranial nerves II through  XII grossly intact   Status is: Inpatient:             Meredeth Ide   [facility] If 7PM-7AM, please contact night-coverage at [email or web address], Office  [phone]   11/24/2022, 4:43 PM  LOS: 0 days

## 2022-11-24 NOTE — Consult Note (Addendum)
Cardiology Consultation   Patient ID: Yaneli Rynearson MRN: [id number]; DOB: Jul 27, 1935  Admit date: 11/23/2022 Date of Consult: 11/24/2022  PCP:  Daisy Floro, MD    [facility] Providers Cardiologist:  Charlton Haws, MD  Electrophysiologist:  Lewayne Bunting, MD    Patient Profile:   Vaudine Prairie is a 87 y.o. female with a hx of sinus node dysfunction s/p PPM, HTN, atrial fibrillation on eliquis, pancreatic cancer who is being seen 11/24/2022 for the evaluation of afib with RVR, elevated troponin at the request of Dr. Sharl Ma.  History of Present Illness:   Ms. Buffett is an 87 year old female with above medical history who is followed by Dr. Eden Emms and Dr. Ladona Ridgel. Per chart review, patient was admitted in 12/2018 for evaluation of generalized weakness and was found to have elevated troponins. No complaints of chest pain. She underwent echocardiogram on 12/27/18 that showed EF 55-60%, normal wall motion, normal RV systolic function, moderately dilated LA, trivial pericardial effusion, mild miral valve prolapse. She later complained of palpitations, and wore a cardiac monitor in 01/2019 that showed sick sinus syndrome and PAF. There was a 6.8 second pause, and patient was referred to EP. She had a PPM implanted on 03/06/19, and she was started on eliquis.    Patient was last seen by cardiology on 06/21/20. At that time, patient was mostly maintaining NSR and was maintained on eliquis, metoprolol succinate 25 mg daily. Her PPM was working normally   Patient presented to the ED on 6/10 complaining of increasing weakness and confusion that had been going on for the past 2 weeks. Initial vital signs showed HR elevated to 146 BPM, BP 156/106, oxygen 95% on room air. Initial EKG showed atrial fibrillation with HR 134 BPM. Labs in the ED showed Na 133, K 3.3, creatinine 1.00, WBC 13.6, hemoglobin 10.8, platelets 131. hsTn 158>250>330. CXR showed no evidence of acute cardiopulmonary disease. Patient  was given IV fluids in the ED. Also received an injection of metoprolol tartrate 5 mg. She converted to NSR. Cardiology consulted for elevated troponin.   On interview, patient is confused and has a difficult time speaking. She can follow simple commands. When asked questions, she attempts to answer but her speech is garbled. Her daughter is at bedside, and she reports that this is very different than her baseline. Reportedly, patient has been feeling weak for the past few days and has not been as active as usual. Per her daughter, she has not complained of chest pain, shortness of breath.   Past Medical History:  Diagnosis Date   Acquired diverticulum of distal esophagus with dysphagia 10/03/2018   AKI (acute kidney injury) (HCC) 08/31/2018   Anxiety    Bradycardia 02/16/2014   DDD (degenerative disc disease), lumbar    DJD (degenerative joint disease)    Esophageal stenosis 08/31/2018   Fibromyalgia    Hemorrhoid    Hypertension    Hypothyroidism    Osteopenia    Sigmoid diverticulosis    Thyroid nodule    PT DENIES    Past Surgical History:  Procedure Laterality Date   ABDOMINAL HYSTERECTOMY     COMPLETE   BALLOON DILATION N/A 09/01/2018   Procedure: BALLOON DILATION;  Surgeon: Kerin Salen, MD;  Location: [facility] ENDOSCOPY;  Service: Gastroenterology;  Laterality: N/A;   BIOPSY  09/01/2018   Procedure: BIOPSY;  Surgeon: Kerin Salen, MD;  Location: [facility] ENDOSCOPY;  Service: Gastroenterology;;   BIOPSY  01/26/2022   Procedure: BIOPSY;  Surgeon: Lemar Lofty., MD;  Location: [facility] ENDOSCOPY;  Service: Gastroenterology;;   BOTOX INJECTION  09/01/2018   Procedure: BOTOX INJECTION;  Surgeon: Kerin Salen, MD;  Location: [facility] ENDOSCOPY;  Service: Gastroenterology;;   BREAST SURGERY     breast biopsy   CHOLECYSTECTOMY     ESOPHAGEAL MANOMETRY N/A 10/07/2018   Procedure: ESOPHAGEAL MANOMETRY (EM);  Surgeon: Kerin Salen, MD;  Location: [facility] ENDOSCOPY;  Service: Gastroenterology;  Laterality:  N/A;   ESOPHAGOGASTRODUODENOSCOPY N/A 10/25/2018   Procedure: ESOPHAGOGASTRODUODENOSCOPY (EGD);  Surgeon: Karie Soda, MD;  Location: [facility] ORS;  Service: General;  Laterality: N/A;   ESOPHAGOGASTRODUODENOSCOPY N/A 01/26/2022   Procedure: ESOPHAGOGASTRODUODENOSCOPY (EGD);  Surgeon: Lemar Lofty., MD;  Location: [facility] ENDOSCOPY;  Service: Gastroenterology;  Laterality: N/A;   ESOPHAGOGASTRODUODENOSCOPY (EGD) WITH PROPOFOL N/A 09/01/2018   Procedure: ESOPHAGOGASTRODUODENOSCOPY (EGD) WITH PROPOFOL;  Surgeon: Kerin Salen, MD;  Location: [facility] ENDOSCOPY;  Service: Gastroenterology;  Laterality: N/A;   ESOPHAGOGASTRODUODENOSCOPY (EGD) WITH PROPOFOL N/A 12/30/2021   Procedure: ESOPHAGOGASTRODUODENOSCOPY (EGD) WITH PROPOFOL;  Surgeon: Willis Modena, MD;  Location: [facility] ENDOSCOPY;  Service: Gastroenterology;  Laterality: N/A;   EUS N/A 01/26/2022   Procedure: UPPER ENDOSCOPIC ULTRASOUND (EUS) RADIAL;  Surgeon: Lemar Lofty., MD;  Location: [facility] ENDOSCOPY;  Service: Gastroenterology;  Laterality: N/A;   FIDUCIAL MARKER PLACEMENT N/A 01/26/2022   Procedure: FIDUCIAL MARKER PLACEMENT;  Surgeon: Lemar Lofty., MD;  Location: [facility] ENDOSCOPY;  Service: Gastroenterology;  Laterality: N/A;   FINE NEEDLE ASPIRATION N/A 12/30/2021   Procedure: FINE NEEDLE ASPIRATION (FNA) LINEAR;  Surgeon: Willis Modena, MD;  Location: [facility] ENDOSCOPY;  Service: Gastroenterology;  Laterality: N/A;   FOREIGN BODY REMOVAL  01/26/2022   Procedure: FOREIGN BODY REMOVAL;  Surgeon: Meridee Score Netty Starring., MD;  Location: [facility] ENDOSCOPY;  Service: Gastroenterology;;   PACEMAKER IMPLANT N/A 03/06/2019   Procedure: PACEMAKER IMPLANT;  Surgeon: Marinus Maw, MD;  Location: [facility] INVASIVE CV LAB;  Service: Cardiovascular;  Laterality: N/A;   UPPER ESOPHAGEAL ENDOSCOPIC ULTRASOUND (EUS) Bilateral 12/30/2021   Procedure: UPPER ESOPHAGEAL ENDOSCOPIC ULTRASOUND (EUS);  Surgeon: Willis Modena, MD;  Location: [facility] ENDOSCOPY;  Service:  Gastroenterology;  Laterality: Bilateral;     Home Medications:  Prior to Admission medications   Medication Sig Start Date End Date Taking? Authorizing Provider  amLODipine (NORVASC) 10 MG tablet Take 1 tablet (10 mg total) by mouth daily. Please call our office to schedule an appt for future refills. Thank you 2nd attempt. 11/18/22  Yes Marinus Maw, MD  apixaban (ELIQUIS) 2.5 MG TABS tablet Take 1 tablet (2.5 mg total) by mouth 2 (two) times daily. 09/09/22  Yes Marinus Maw, MD  losartan (COZAAR) 100 MG tablet Take 100 mg by mouth in the morning. 01/12/19  Yes [provider]  metoprolol succinate (TOPROL-XL) 50 MG 24 hr tablet Take 50 mg by mouth in the morning. 07/07/21  Yes [provider]  pantoprazole (PROTONIX) 20 MG tablet Take 20 mg by mouth daily.   Yes [provider]  potassium chloride SA (KLOR-CON M) 20 MEQ tablet Take 1 tablet (20 mEq total) by mouth daily. 05/28/22  Yes Malachy Mood, MD  docusate sodium (COLACE) 100 MG capsule Take 100 mg by mouth 2 (two) times daily. Patient not taking: Reported on 11/23/2022    [provider]  feeding supplement (ENSURE SURGERY) LIQD Take 237 mLs by mouth 2 (two) times daily between meals. Patient taking differently: Take 237 mLs by mouth 3 (three) times a week. 10/17/18   Almon Hercules, MD  ondansetron (ZOFRAN-ODT) 4 MG disintegrating tablet Take 4 mg by mouth every 8 (eight) hours as needed for nausea. Patient not taking: Reported on 11/23/2022 12/17/21   [provider]  pantoprazole (PROTONIX) 40 MG tablet Take 1 tablet (40 mg total) by mouth daily. Patient not taking: Reported on 11/23/2022 01/26/22   Mansouraty, Netty Starring., MD  polyethylene glycol (MIRALAX) 17 g packet Take 17 g by mouth daily. Patient not taking: Reported on 01/21/2022 12/28/21   Georga Kaufmann T, PA-C  potassium chloride (KLOR-CON) 20 MEQ packet 1 packet (20 meq) 2 times daily for 3 days. Then 1 packet (20 meq) daily for 2  weeks Patient not taking: Reported on 11/23/2022 05/15/22   Malachy Mood, MD    Inpatient Medications: Scheduled Meds:  amLODipine  10 mg Oral Daily   apixaban  2.5 mg Oral BID   docusate sodium  100 mg Oral BID   feeding supplement  237 mL Oral BID BM   metoprolol succinate  50 mg Oral Daily   potassium chloride SA  20 mEq Oral Daily   Continuous Infusions:  PRN Meds: acetaminophen **OR** acetaminophen, albuterol, ondansetron **OR** ondansetron (ZOFRAN) IV, polyethylene glycol, traZODone  Allergies:    Allergies  Allergen Reactions   Ace Inhibitors Cough   Elavil [Amitriptyline Hcl] Hives   Ketek [Telithromycin] Hives    Social History:   Social History   Socioeconomic History   Marital status: Widowed    Spouse name: Not on file   Number of children: Not on file   Years of education: Not on file   Highest education level: Not on file  Occupational History   Not on file  Tobacco Use   Smoking status: Never   Smokeless tobacco: Never  Vaping Use   Vaping Use: Never used  Substance and Sexual Activity   Alcohol use: No   Drug use: No   Sexual activity: Never  Other Topics Concern   Not on file  Social History Narrative   Not on file   Social Determinants of Health   Financial Resource Strain: Not on file  Food Insecurity: No Food Insecurity (11/23/2022)   Hunger Vital Sign    Worried About Running Out of Food in the Last Year: Never true    Ran Out of Food in the Last Year: Never true  Transportation Needs: No Transportation Needs (11/23/2022)   PRAPARE - Administrator, Civil Service (Medical): No    Lack of Transportation (Non-Medical): No  Physical Activity: Not on file  Stress: Not on file  Social Connections: Not on file  Intimate Partner Violence: Not At Risk (11/23/2022)   Humiliation, Afraid, Rape, and Kick questionnaire    Fear of Current or Ex-Partner: No    Emotionally Abused: No    Physically Abused: No    Sexually Abused: No     Family History:    Family History  Problem Relation Age of Onset   Hypertension Mother    Diabetes Mother        non-insulin dependant   CAD Brother    Cancer Maternal Aunt    Cancer Maternal Uncle      ROS:  Please see the history of present illness.   All other ROS reviewed and negative.     Physical Exam/Data:   Vitals:   11/23/22 1954 11/23/22 2230 11/24/22 0413 11/24/22 0615  BP:  (!) 145/76 (!) 149/73 (!) 165/86  Pulse:  65 66 64  Resp:  17 17  18  Temp: 100 F (37.8 C) 98.6 F (37 C) 99.8 F (37.7 C) 99.9 F (37.7 C)  TempSrc: Oral Oral Oral Oral  SpO2:  98% 99% 99%  Weight:      Height:        Intake/Output Summary (Last 24 hours) at 11/24/2022 1238 Last data filed at 11/24/2022 0900 Gross per 24 hour  Intake 540 ml  Output --  Net 540 ml      11/23/2022    8:09 AM 05/15/2022   10:50 AM 03/27/2022    2:16 PM  Last 3 Weights  Weight (lbs) 121 lb 120 lb 4.8 oz 125 lb 3.2 oz  Weight (kg) 54.885 kg 54.568 kg 56.79 kg     Body mass index is 19.53 kg/m.  General:  Elderly, thin female. Laying in the bed with head slightly elevated. Appears confused, no acute distress  HEENT: normal Neck: no JVD Vascular: Radial pulses 2+ bilaterally Cardiac:  normal S1, S2; RRR; no murmur  Lungs:  clear to auscultation bilaterally, no wheezing, rhonchi or rales. Normal work of breathing on room air  Abd: soft, nontender, no hepatomegaly  Ext: no edema in BLE  Musculoskeletal:  No deformities, BUE and BLE strength normal and equal Skin: warm and dry  Neuro:  CNs 2-12 intact, no focal abnormalities noted Psych:  Normal affect   EKG:  The EKG was personally reviewed and demonstrates:  Initial EKG showed atrial fibrillation with HR 134 Bpm. Repeat EKG showed sinus rhythm, possible LVH  Telemetry:  Telemetry was personally reviewed and demonstrates:  Patient presented in Afib with HR in the 120s-140s. Yesterday, she converted to NSR. Since then, has been maintaining NSR  with occasional episodes of paced rhythm   Relevant CV Studies: Cardiac Studies & Procedures       ECHOCARDIOGRAM  ECHOCARDIOGRAM COMPLETE 11/24/2022  Narrative ECHOCARDIOGRAM REPORT    Patient Name:   [facility] Date of Exam: 11/24/2022 Medical Rec #:  [id number]     Height:       66.0 in Accession #:    [id number]    Weight:       121.0 lb Date of Birth:  03/29/1936     BSA:          1.615 m Patient Age:    86 years      BP:           165/86 mmHg Patient Gender: F             HR:           66 bpm. Exam Location:  Inpatient  Procedure: 2D Echo, 3D Echo, Cardiac Doppler and Color Doppler  Indications:    R07.9* Chest pain, unspecified. Acute AMS.  History:        Patient has prior history of Echocardiogram examinations, most recent 12/27/2018. Abnormal ECG and Pacemaker, Mitral Valve Prolapse, Mitral Valve Disease and Aortic Valve Disease, Arrythmias:Atrial Fibrillation and Bradycardia, Signs/Symptoms:Syncope; Risk Factors:Hypertension. Cancer.  Sonographer:    Sheralyn Boatman RDCS Referring Phys: [id number] Jonita Albee  IMPRESSIONS   1. Left ventricular ejection fraction, by estimation, is 45 to 50%. The left ventricle has mildly decreased function. The left ventricle has no regional wall motion abnormalities. There is mild concentric left ventricular hypertrophy. Left ventricular diastolic parameters are consistent with Grade II diastolic dysfunction (pseudonormalization). 2. Right ventricular systolic function is normal. The right ventricular size is normal. There is moderately elevated pulmonary artery systolic pressure. The estimated right  ventricular systolic pressure is 52.2 mmHg. 3. Left atrial size was severely dilated. 4. Right atrial size was moderately dilated. 5. The mitral valve is grossly normal. Mild mitral valve regurgitation. No evidence of mitral stenosis. There is mild holosystolic prolapse of the middle scallop of the posterior leaflet of the mitral  valve. 6. Tricuspid valve regurgitation is mild to moderate. 7. Focal calcification of the non coronary cusp . The aortic valve is tricuspid. Aortic valve regurgitation is not visualized. Aortic valve sclerosis/calcification is present, without any evidence of aortic stenosis. 8. The inferior vena cava is dilated in size with <50% respiratory variability, suggesting right atrial pressure of 15 mmHg.  Comparison(s): Changes from prior study are noted. The left ventricular function is worsened. LVEF now 45-50%. Mild MR. Mild to moderate TR. RVSP 52 mmHG.  FINDINGS Left Ventricle: Left ventricular ejection fraction, by estimation, is 45 to 50%. The left ventricle has mildly decreased function. The left ventricle has no regional wall motion abnormalities. The left ventricular internal cavity size was normal in size. There is mild concentric left ventricular hypertrophy. Abnormal (paradoxical) septal motion, consistent with RV pacemaker. Left ventricular diastolic parameters are consistent with Grade II diastolic dysfunction (pseudonormalization).  Right Ventricle: The right ventricular size is normal. No increase in right ventricular wall thickness. Right ventricular systolic function is normal. There is moderately elevated pulmonary artery systolic pressure. The tricuspid regurgitant velocity is 3.05 m/s, and with an assumed right atrial pressure of 15 mmHg, the estimated right ventricular systolic pressure is 52.2 mmHg.  Left Atrium: Left atrial size was severely dilated.  Right Atrium: Right atrial size was moderately dilated.  Pericardium: There is no evidence of pericardial effusion.  Mitral Valve: The mitral valve is grossly normal. There is mild holosystolic prolapse of the middle scallop of the posterior leaflet of the mitral valve. There is moderate thickening of the mitral valve leaflet(s). Mild mitral valve regurgitation. No evidence of mitral valve stenosis.  Tricuspid Valve: The  tricuspid valve is grossly normal. Tricuspid valve regurgitation is mild to moderate. No evidence of tricuspid stenosis.  Aortic Valve: Focal calcification of the non coronary cusp. The aortic valve is tricuspid. Aortic valve regurgitation is not visualized. Aortic valve sclerosis/calcification is present, without any evidence of aortic stenosis.  Pulmonic Valve: The pulmonic valve was grossly normal. Pulmonic valve regurgitation is trivial. No evidence of pulmonic stenosis.  Aorta: The aortic root and ascending aorta are structurally normal, with no evidence of dilitation.  Venous: The inferior vena cava is dilated in size with less than 50% respiratory variability, suggesting right atrial pressure of 15 mmHg.  IAS/Shunts: There is right bowing of the interatrial septum, suggestive of elevated left atrial pressure. The atrial septum is grossly normal.  Additional Comments: A device lead is visualized in the right atrium and right ventricle.   LEFT VENTRICLE PLAX 2D LVIDd:         4.00 cm   Diastology LVIDs:         3.30 cm   LV e' medial:    5.11 cm/s LV PW:         1.30 cm   LV E/e' medial:  12.3 LV IVS:        1.20 cm   LV e' lateral:   6.96 cm/s LVOT diam:     2.20 cm   LV E/e' lateral: 9.0 LV SV:         85 LV SV Index:   52 LVOT Area:  3.80 cm  3D Volume EF: 3D EF:        50 %  RIGHT VENTRICLE             IVC RV S prime:     14.70 cm/s  IVC diam: 1.95 cm TAPSE (M-mode): 2.4 cm  LEFT ATRIUM              Index        RIGHT ATRIUM           Index LA diam:        5.90 cm  3.65 cm/m   RA Area:     18.00 cm LA Vol (A2C):   128.0 ml 79.25 ml/m  RA Volume:   41.40 ml  25.63 ml/m LA Vol (A4C):   86.8 ml  53.74 ml/m LA Biplane Vol: 110.0 ml 68.10 ml/m AORTIC VALVE             PULMONIC VALVE LVOT Vmax:   118.00 cm/s PR End Diast Vel: 1.91 msec LVOT Vmean:  73.100 cm/s LVOT VTI:    0.223 m  AORTA Ao Root diam: 2.90 cm Ao Asc diam:  3.20 cm  MITRAL VALVE                   TRICUSPID VALVE MV Area (PHT): 3.39 cm       TR Peak grad:   37.2 mmHg MV Decel Time: 224 msec       TR Vmax:        305.00 cm/s MR Peak grad:    95.6 mmHg MR Mean grad:    60.0 mmHg    SHUNTS MR Vmax:         489.00 cm/s  Systemic VTI:  0.22 m MR Vmean:        360.0 cm/s   Systemic Diam: 2.20 cm MR PISA:         0.57 cm MR PISA Eff ROA: 4 mm MR PISA Radius:  0.30 cm MV E velocity: 62.60 cm/s MV A velocity: 40.25 cm/s MV E/A ratio:  1.56  Lennie Odor MD Electronically signed by Lennie Odor MD Signature Date/Time: 11/24/2022/12:16:24 PM    Final    MONITORS  LONG TERM MONITOR-LIVE TELEMETRY (3-14 DAYS) 03/01/2019  Narrative PAF Long pauses sick sinus Needs EP referral for possible pacer            Laboratory Data:  High Sensitivity Troponin:   Recent Labs  Lab 11/23/22 0839 11/23/22 1140 11/23/22 1309 11/24/22 1046  TROPONINIHS 158* 250* 330* 454*     Chemistry Recent Labs  Lab 11/23/22 0839 11/24/22 0424  NA 133* 132*  K 3.3* 3.7  CL 98 102  CO2 24 23  GLUCOSE 136* 97  BUN 20 17  CREATININE 1.00 0.76  CALCIUM 9.6 8.8*  GFRNONAA 56* >60  ANIONGAP 11 7    Recent Labs  Lab 11/23/22 0839  PROT 7.9  ALBUMIN 3.6  AST 48*  ALT 21  ALKPHOS 167*  BILITOT 1.0   Lipids No results for input(s): "CHOL", "TRIG", "HDL", "LABVLDL", "LDLCALC", "CHOLHDL" in the last 168 hours.  Hematology Recent Labs  Lab 11/23/22 0839 11/24/22 0424  WBC 13.6* 8.6  RBC 4.56 3.70*  HGB 10.8* 9.0*  HCT 33.1* 28.0*  MCV 72.6* 75.7*  MCH 23.7* 24.3*  MCHC 32.6 32.1  RDW 16.5* 16.8*  PLT 131* 102*   Thyroid No results for input(s): "TSH", "FREET4" in the last 168 hours.  BNPNo results for  input(s): "BNP", "PROBNP" in the last 168 hours.  DDimer No results for input(s): "DDIMER" in the last 168 hours.   Radiology/Studies:  ECHOCARDIOGRAM COMPLETE  Result Date: 11/24/2022    ECHOCARDIOGRAM REPORT   Patient Name:   [facility] Date of Exam: 11/24/2022  Medical Rec #:  [id number]     Height:       66.0 in Accession #:    [id number]    Weight:       121.0 lb Date of Birth:  [birth year]     BSA:          1.615 m Patient Age:    86 years      BP:           165/86 mmHg Patient Gender: F             HR:           66 bpm. Exam Location:  Inpatient Procedure: 2D Echo, 3D Echo, Cardiac Doppler and Color Doppler Indications:    R07.9* Chest pain, unspecified. Acute AMS.  History:        Patient has prior history of Echocardiogram examinations, most                 recent 12/27/2018. Abnormal ECG and Pacemaker, Mitral Valve                 Prolapse, Mitral Valve Disease and Aortic Valve Disease,                 Arrythmias:Atrial Fibrillation and Bradycardia,                 Signs/Symptoms:Syncope; Risk Factors:Hypertension. Cancer.  Sonographer:    Sheralyn Boatman RDCS Referring Phys: [id number] Jonita Albee IMPRESSIONS  1. Left ventricular ejection fraction, by estimation, is 45 to 50%. The left ventricle has mildly decreased function. The left ventricle has no regional wall motion abnormalities. There is mild concentric left ventricular hypertrophy. Left ventricular diastolic parameters are consistent with Grade II diastolic dysfunction (pseudonormalization).  2. Right ventricular systolic function is normal. The right ventricular size is normal. There is moderately elevated pulmonary artery systolic pressure. The estimated right ventricular systolic pressure is 52.2 mmHg.  3. Left atrial size was severely dilated.  4. Right atrial size was moderately dilated.  5. The mitral valve is grossly normal. Mild mitral valve regurgitation. No evidence of mitral stenosis. There is mild holosystolic prolapse of the middle scallop of the posterior leaflet of the mitral valve.  6. Tricuspid valve regurgitation is mild to moderate.  7. Focal calcification of the non coronary cusp . The aortic valve is tricuspid. Aortic valve regurgitation is not visualized. Aortic valve  sclerosis/calcification is present, without any evidence of aortic stenosis.  8. The inferior vena cava is dilated in size with <50% respiratory variability, suggesting right atrial pressure of 15 mmHg. Comparison(s): Changes from prior study are noted. The left ventricular function is worsened. LVEF now 45-50%. Mild MR. Mild to moderate TR. RVSP 52 mmHG. FINDINGS  Left Ventricle: Left ventricular ejection fraction, by estimation, is 45 to 50%. The left ventricle has mildly decreased function. The left ventricle has no regional wall motion abnormalities. The left ventricular internal cavity size was normal in size. There is mild concentric left ventricular hypertrophy. Abnormal (paradoxical) septal motion, consistent with RV pacemaker. Left ventricular diastolic parameters are consistent with Grade II diastolic dysfunction (pseudonormalization). Right Ventricle: The right ventricular size is normal. No increase in right ventricular wall  thickness. Right ventricular systolic function is normal. There is moderately elevated pulmonary artery systolic pressure. The tricuspid regurgitant velocity is 3.05 m/s, and with an assumed right atrial pressure of 15 mmHg, the estimated right ventricular systolic pressure is 52.2 mmHg. Left Atrium: Left atrial size was severely dilated. Right Atrium: Right atrial size was moderately dilated. Pericardium: There is no evidence of pericardial effusion. Mitral Valve: The mitral valve is grossly normal. There is mild holosystolic prolapse of the middle scallop of the posterior leaflet of the mitral valve. There is moderate thickening of the mitral valve leaflet(s). Mild mitral valve regurgitation. No evidence of mitral valve stenosis. Tricuspid Valve: The tricuspid valve is grossly normal. Tricuspid valve regurgitation is mild to moderate. No evidence of tricuspid stenosis. Aortic Valve: Focal calcification of the non coronary cusp. The aortic valve is tricuspid. Aortic valve  regurgitation is not visualized. Aortic valve sclerosis/calcification is present, without any evidence of aortic stenosis. Pulmonic Valve: The pulmonic valve was grossly normal. Pulmonic valve regurgitation is trivial. No evidence of pulmonic stenosis. Aorta: The aortic root and ascending aorta are structurally normal, with no evidence of dilitation. Venous: The inferior vena cava is dilated in size with less than 50% respiratory variability, suggesting right atrial pressure of 15 mmHg. IAS/Shunts: There is right bowing of the interatrial septum, suggestive of elevated left atrial pressure. The atrial septum is grossly normal. Additional Comments: A device lead is visualized in the right atrium and right ventricle.  LEFT VENTRICLE PLAX 2D LVIDd:         4.00 cm   Diastology LVIDs:         3.30 cm   LV e' medial:    5.11 cm/s LV PW:         1.30 cm   LV E/e' medial:  12.3 LV IVS:        1.20 cm   LV e' lateral:   6.96 cm/s LVOT diam:     2.20 cm   LV E/e' lateral: 9.0 LV SV:         85 LV SV Index:   52 LVOT Area:     3.80 cm                           3D Volume EF:                          3D EF:        50 % RIGHT VENTRICLE             IVC RV S prime:     14.70 cm/s  IVC diam: 1.95 cm TAPSE (M-mode): 2.4 cm LEFT ATRIUM              Index        RIGHT ATRIUM           Index LA diam:        5.90 cm  3.65 cm/m   RA Area:     18.00 cm LA Vol (A2C):   128.0 ml 79.25 ml/m  RA Volume:   41.40 ml  25.63 ml/m LA Vol (A4C):   86.8 ml  53.74 ml/m LA Biplane Vol: 110.0 ml 68.10 ml/m  AORTIC VALVE             PULMONIC VALVE LVOT Vmax:   118.00 cm/s PR End Diast Vel: 1.91 msec LVOT Vmean:  73.100 cm/s LVOT VTI:  0.223 m  AORTA Ao Root diam: 2.90 cm Ao Asc diam:  3.20 cm MITRAL VALVE                  TRICUSPID VALVE MV Area (PHT): 3.39 cm       TR Peak grad:   37.2 mmHg MV Decel Time: 224 msec       TR Vmax:        305.00 cm/s MR Peak grad:    95.6 mmHg MR Mean grad:    60.0 mmHg    SHUNTS MR Vmax:         489.00 cm/s   Systemic VTI:  0.22 m MR Vmean:        360.0 cm/s   Systemic Diam: 2.20 cm MR PISA:         0.57 cm MR PISA Eff ROA: 4 mm MR PISA Radius:  0.30 cm MV E velocity: 62.60 cm/s MV A velocity: 40.25 cm/s MV E/A ratio:  1.56 Lennie Odor MD Electronically signed by Lennie Odor MD Signature Date/Time: 11/24/2022/12:16:24 PM    Final    CT Head Wo Contrast  Result Date: 11/23/2022 CLINICAL DATA:  Provided history: Mental status change, unknown cause. EXAM: CT HEAD WITHOUT CONTRAST TECHNIQUE: Contiguous axial images were obtained from the base of the skull through the vertex without intravenous contrast. RADIATION DOSE REDUCTION: This exam was performed according to the departmental dose-optimization program which includes automated exposure control, adjustment of the mA and/or kV according to patient size and/or use of iterative reconstruction technique. COMPARISON:  No pertinent prior exams available for comparison. FINDINGS: Brain: Mild cerebral atrophy. There is no acute intracranial hemorrhage. No demarcated cortical infarct. No extra-axial fluid collection. No evidence of an intracranial mass. No midline shift. Vascular: No hyperdense vessel.  Atherosclerotic calcifications. Skull: No fracture or aggressive osseous lesion. Sinuses/Orbits: No mass or acute finding within the imaged orbits. Tiny mucous retention cyst within the right maxillary sinus. Minimal mucosal thickening within the left maxillary sinus and bilateral ethmoid sinuses. IMPRESSION: 1. No evidence of an acute intracranial abnormality. 2. Mild cerebral atrophy. Electronically Signed   By: Jackey Loge D.O.   On: 11/23/2022 09:54   DG Chest 2 View  Result Date: 11/23/2022 CLINICAL DATA:  Weakness. EXAM: CHEST - 2 VIEW COMPARISON:  03/07/2019. FINDINGS: Left chest dual-chamber pacemaker with leads projecting over the right atrium and ventricle. Low lung volumes accentuate the pulmonary vasculature and cardiomediastinal silhouette. No  consolidation or pulmonary edema. Stable cardiomegaly and mediastinal contours with tortuous aorta. No pleural effusion or pneumothorax. IMPRESSION: No evidence of acute cardiopulmonary disease. Electronically Signed   By: Orvan Falconer M.D.   On: 11/23/2022 09:23     Assessment and Plan:   Paroxysmal Atrial Fibrillation  - Patient has a known history of PAF. Her last device check from 5/17/245 showed only 1.6% afib burden  - Upon presentation, patient was in afib with RVR with HR in the 140s. Converted to NSR on IV fluids and after metoprolol injection.  - Per telemetry, patient is now maintaining NSR - Continue metoprolol succinate 50 mg daily  - Patient is on eliquis 2.5 mg BID- this is the appropriate dose for her due to age, weight   Elevated Troponin  - hsTn 158>250>330  - Patient is unable to answer questions due to garbled speech, confusion. Daughter reports that she has not complained of chest pain at home  - EKG nonischemic  - Ordered echocardiogram - Suspect trop elevation is  due to tachycardia as patient was in afib with RVR on presentation. I have ordered a fourth trop to be drawn this AM to better trend   Otherwise per primary  - Confusion, AMS-- patient had difficulty speaking this AM and could not seem to find her words. Also had difficulty articulating her words. Internal medicine ordered MRI brain, which I agree with. I contracted [employer] device rep and confirmed that patient's PPM is compatible with MRI  - Pancreatic cancer   Risk Assessment/Risk Scores:    CHA2DS2-VASc Score = 5   This indicates a 7.2% annual risk of stroke. The patient's score is based upon: CHF History: 0 HTN History: 1 Diabetes History: 0 Stroke History: 0 Vascular Disease History: 1 Age Score: 2 Gender Score: 1   For questions or updates, please contact [facility] Please consult [email or web address] for contact info under    Signed, Rollene Rotunda, MD  11/24/2022 12:38  PM  History and all data above reviewed.  Patient examined.  I agree with the findings as above.  The patient presents primarily with AMS.  She lives with her daughter.  She has six living children (2 deceased) and 52 grandchildren.  She usually gets around and walks to the mailbox with her [place].  She has had increased weakness over the last few days and now with AMS.  She is able to shake her head yes no and she denies chest pain or palpitations.  She was in atrial fib on admission but she does not feel this and the family does not report taking heart rates at home.  She has had no syncope or presyncope.  Now found to have elevated troponin.  No report of new SOB, PND.    The patient exam reveals COR:RRR  ,  Lungs: Clear  ,  Abd: Positive bowel sounds, no rebound no guarding, Ext No edema  Neuro:  Moves all extremities.  Following commands.  Questionable right facial droop.   .  All available labs, radiology testing, previous records reviewed. Agree with documented assessment and plan.   Atrial fib with rapid rate:  Now back in NSR.  Continue Eliquis.  Continue current beta blocker.   Cardiomyopathy:   I reviewed the images and I compared to the previous.  The EF does appear to be mildly reduced with global hypokinesis.  No regional wall motion abnormalities.  I would suggest continued current  management and will add a low dose of spironolactone. BP is elevated but she does report an allergy to ACE so reluctant to start ARB.    Elevated trop:  I am not planning any ischemia work up pending the neuro evaluation.  I would suspect the enzyme elevation is a secondary rather than primary issue.  Discussed with multiple family members at the bedside.     Fayrene Fearing Ronnisha Felber  12:40 PM  11/24/2022

## 2022-11-24 NOTE — Progress Notes (Signed)
  Echocardiogram 2D Echocardiogram has been performed.  Shannon Ortiz 11/24/2022, 11:27 AM

## 2022-11-25 ENCOUNTER — Inpatient Hospital Stay (HOSPITAL_COMMUNITY): Payer: Medicare Other

## 2022-11-25 DIAGNOSIS — I639 Cerebral infarction, unspecified: Secondary | ICD-10-CM

## 2022-11-25 DIAGNOSIS — I4891 Unspecified atrial fibrillation: Secondary | ICD-10-CM | POA: Diagnosis not present

## 2022-11-25 LAB — LIPID PANEL
Cholesterol: 194 mg/dL (ref 0–200)
HDL: 76 mg/dL (ref >40)
LDL Cholesterol: 102 mg/dL — ABNORMAL HIGH (ref 0–99)
Total CHOL/HDL Ratio: 2.6 RATIO
Triglycerides: 80 mg/dL (ref <150)
VLDL: 16 mg/dL (ref 0–40)

## 2022-11-25 LAB — HEMOGLOBIN A1C
Hgb A1c MFr Bld: 4.9 % (ref 4.8–5.6)
Mean Plasma Glucose: 93.93 mg/dL

## 2022-11-25 LAB — APTT
aPTT: 35 seconds (ref 24–36)
aPTT: 58 seconds — ABNORMAL HIGH (ref 24–36)

## 2022-11-25 LAB — HEPARIN LEVEL (UNFRACTIONATED): Heparin Unfractionated: 0.8 IU/mL — ABNORMAL HIGH (ref 0.30–0.70)

## 2022-11-25 MED ORDER — AMIODARONE HCL IN DEXTROSE 360-4.14 MG/200ML-% IV SOLN
30.0000 mg/h | INTRAVENOUS | Status: DC
  Filled 2022-11-25: qty 200

## 2022-11-25 MED ORDER — IOHEXOL 350 MG/ML SOLN
75.0000 mL | Freq: Once | INTRAVENOUS | Status: AC | PRN
  Administered 2022-11-25: 75 mL via INTRAVENOUS

## 2022-11-25 MED ORDER — METOPROLOL TARTRATE 5 MG/5ML IV SOLN: 5.0000 mg | Freq: Three times a day (TID) | INTRAVENOUS | Status: DC

## 2022-11-25 MED ORDER — ATORVASTATIN CALCIUM 20 MG PO TABS
20.0000 mg | ORAL_TABLET | Freq: Every day | ORAL | Status: DC
  Administered 2022-11-26 – 2022-12-03 (×8): 20 mg via ORAL
  Filled 2022-11-25 (×8): qty 1

## 2022-11-25 MED ORDER — HEPARIN (PORCINE) 25000 UT/250ML-% IV SOLN
900.0000 [IU]/h | INTRAVENOUS | Status: DC
  Administered 2022-11-25: 700 [IU]/h via INTRAVENOUS
  Administered 2022-11-26: 900 [IU]/h via INTRAVENOUS
  Filled 2022-11-25 (×2): qty 250

## 2022-11-25 MED ORDER — AMIODARONE HCL IN DEXTROSE 360-4.14 MG/200ML-% IV SOLN: 60.0000 mg/h | INTRAVENOUS | Status: AC

## 2022-11-25 MED ORDER — STROKE: EARLY STAGES OF RECOVERY BOOK
Freq: Once | Status: AC
  Filled 2022-11-25: qty 1

## 2022-11-25 MED ORDER — SODIUM CHLORIDE 0.9 % IV SOLN: INTRAVENOUS | Status: AC

## 2022-11-25 MED ORDER — METOPROLOL TARTRATE 5 MG/5ML IV SOLN
5.0000 mg | Freq: Three times a day (TID) | INTRAVENOUS | Status: DC
  Administered 2022-11-25 – 2022-11-26 (×3): 5 mg via INTRAVENOUS
  Filled 2022-11-25 (×3): qty 5

## 2022-11-25 NOTE — Progress Notes (Signed)
ANTICOAGULATION CONSULT NOTE  Pharmacy Consult for heparin  Indication: atrial fibrillation - bridge therapy while apixaban on hold  Allergies  Allergen Reactions   Ace Inhibitors Cough   Elavil [Amitriptyline Hcl] Hives   Ketek [Telithromycin] Hives    Patient Measurements: Height: 5\' 6"  (167.6 cm) Weight: 54.9 kg (121 lb) IBW/kg (Calculated) : 59.3 Heparin Dosing Weight: 54.9 kg  Vital Signs: Temp: 99.4 F (37.4 C) (06/12 1959) Temp Source: Oral (06/12 1959) BP: 142/80 (06/12 1959) Pulse Rate: 63 (06/12 1959)  Labs: Recent Labs    11/23/22 0839 11/23/22 1140 11/23/22 1309 11/24/22 0424 11/24/22 1046 11/24/22 1407 11/25/22 1032 11/25/22 1833  HGB 10.8*  --   --  9.0*  --   --   --   --   HCT 33.1*  --   --  28.0*  --   --   --   --   PLT 131*  --   --  102*  --   --   --   --   APTT  --   --   --   --   --   --  35 58*  HEPARINUNFRC  --   --   --   --   --   --  0.80*  --   CREATININE 1.00  --   --  0.76  --   --   --   --   TROPONINIHS 158*   < > 330*  --  454* 442*  --   --    < > = values in this interval not displayed.     Estimated Creatinine Clearance: 45.4 mL/min (by C-G formula based on SCr of 0.76 mg/dL).   Medications:  Medications Prior to Admission  Medication Sig Dispense Refill Last Dose   amLODipine (NORVASC) 10 MG tablet Take 1 tablet (10 mg total) by mouth daily. Please call our office to schedule an appt for future refills. Thank you 2nd attempt. 15 tablet 0 11/22/2022   apixaban (ELIQUIS) 2.5 MG TABS tablet Take 1 tablet (2.5 mg total) by mouth 2 (two) times daily. 180 tablet 1 11/22/2022 at 1700   losartan (COZAAR) 100 MG tablet Take 100 mg by mouth in the morning.   11/22/2022   metoprolol succinate (TOPROL-XL) 50 MG 24 hr tablet Take 50 mg by mouth in the morning.   11/22/2022 at 0900   pantoprazole (PROTONIX) 20 MG tablet Take 20 mg by mouth daily.   11/22/2022   potassium chloride SA (KLOR-CON M) 20 MEQ tablet Take 1 tablet (20 mEq total) by  mouth daily. 30 tablet 2 11/22/2022   docusate sodium (COLACE) 100 MG capsule Take 100 mg by mouth 2 (two) times daily. (Patient not taking: Reported on 11/23/2022)   Not Taking   feeding supplement (ENSURE SURGERY) LIQD Take 237 mLs by mouth 2 (two) times daily between meals. (Patient taking differently: Take 237 mLs by mouth 3 (three) times a week.) 60 Bottle 0    ondansetron (ZOFRAN-ODT) 4 MG disintegrating tablet Take 4 mg by mouth every 8 (eight) hours as needed for nausea. (Patient not taking: Reported on 11/23/2022)   Completed Course   pantoprazole (PROTONIX) 40 MG tablet Take 1 tablet (40 mg total) by mouth daily. (Patient not taking: Reported on 11/23/2022) 30 tablet 6 Not Taking   polyethylene glycol (MIRALAX) 17 g packet Take 17 g by mouth daily. (Patient not taking: Reported on 01/21/2022) 30 each 3 Not Taking   potassium chloride (KLOR-CON)  20 MEQ packet 1 packet (20 meq) 2 times daily for 3 days. Then 1 packet (20 meq) daily for 2 weeks (Patient not taking: Reported on 11/23/2022) 20 packet 0 Not Taking    Assessment: 87 yo F on apixaban 2.5 mg po bid PTA for PAF.  Pharmacy consulted to dose heparin for bridge therapy while pt NPO for new watershed stroke on MRI 11/24/22.  Last dose of apixaban 2.5 mg 6/11 at 0841 am  Today, 11/25/2022: First aPTT slightly subtherapeutic on heparin at 700 units/hr CBC (6/11): Hgb & plt both low & trending down (but no contraindications to anticoag at this time) No bleeding or infusion issues per RN  Goal of Therapy:  Heparin level 0.3 - 0.5 units/ml aPTT 66-85 seconds Monitor platelets by anticoagulation protocol: Yes   Plan:  No bolus per MD orders; aim for lower stroke goal of aPTT 66-86 seconds & heparin level 0.3 - 0.5 units/mL Increase heparin rate to 800 units/hr Recheck 8-hr aPTT Daily CBC & heparin level; aPTTs as needed until DOAC anti-Xa effects have worn off   Bernadene Person, PharmD, BCPS [phone] 11/25/2022, 8:11 PM

## 2022-11-25 NOTE — Progress Notes (Signed)
Progress Note  Patient Name: Shannon Ortiz Date of Encounter: 11/25/2022  Primary Cardiologist:   Charlton Haws, MD   Subjective   Denies pain or SOB, difficult to assess with expressive aphasia.   Inpatient Medications    Scheduled Meds:  [START ON 11/26/2022]  stroke: early stages of recovery book   Does not apply Once   docusate sodium  100 mg Oral BID   feeding supplement  237 mL Oral BID BM   metoprolol tartrate  5 mg Intravenous Q8H   Continuous Infusions:  sodium chloride 50 mL/hr at 11/25/22 1010   heparin 700 Units/hr (11/25/22 1109)   PRN Meds: acetaminophen **OR** acetaminophen, albuterol, ondansetron **OR** ondansetron (ZOFRAN) IV, polyethylene glycol, traZODone   Vital Signs    Vitals:   11/24/22 1314 11/24/22 2033 11/25/22 0000 11/25/22 0650  BP: (!) 156/81 (!) 143/74  (!) 148/98  Pulse: 63 64  (!) 124  Resp:  18 20 17   Temp: 99.4 F (37.4 C) 99.1 F (37.3 C)  98.9 F (37.2 C)  TempSrc: Oral Oral  Oral  SpO2: 98% 100%  97%  Weight:      Height:        Intake/Output Summary (Last 24 hours) at 11/25/2022 1151 Last data filed at 11/25/2022 0600 Gross per 24 hour  Intake 485.46 ml  Output --  Net 485.46 ml   Filed Weights   11/23/22 0809  Weight: 54.9 kg    Telemetry    Atrial fib with rapid rate - Personally Reviewed  ECG    NA - Personally Reviewed  Physical Exam   GEN: No acute distress.   Neck: No  JVD Cardiac: Irregular RR, no murmurs, rubs, or gallops.  Respiratory: Clear  to auscultation bilaterally. GI: Soft, nontender, non-distended  MS: No  edema; No deformity. Neuro:    Moves all extremities  Labs    Chemistry Recent Labs  Lab 11/23/22 0839 11/24/22 0424  NA 133* 132*  K 3.3* 3.7  CL 98 102  CO2 24 23  GLUCOSE 136* 97  BUN 20 17  CREATININE 1.00 0.76  CALCIUM 9.6 8.8*  PROT 7.9  --   ALBUMIN 3.6  --   AST 48*  --   ALT 21  --   ALKPHOS 167*  --   BILITOT 1.0  --   GFRNONAA 56* >60  ANIONGAP 11 7      Hematology Recent Labs  Lab 11/23/22 0839 11/24/22 0424  WBC 13.6* 8.6  RBC 4.56 3.70*  HGB 10.8* 9.0*  HCT 33.1* 28.0*  MCV 72.6* 75.7*  MCH 23.7* 24.3*  MCHC 32.6 32.1  RDW 16.5* 16.8*  PLT 131* 102*    Cardiac EnzymesNo results for input(s): "TROPONINI" in the last 168 hours. No results for input(s): "TROPIPOC" in the last 168 hours.   BNPNo results for input(s): "BNP", "PROBNP" in the last 168 hours.   DDimer No results for input(s): "DDIMER" in the last 168 hours.   Radiology    VAS US CAROTID (at [facility])  Result Date: 11/25/2022 Carotid Arterial Duplex Study Patient Name:  Shannon Ortiz  Date of Exam:   11/25/2022 Medical Rec #: [id number]      Accession #:    [id number] Date of Birth: [birth year]      Patient Gender: F Patient Age:   87 years Exam Location:  [facility] Procedure:      VAS US CAROTID Referring Phys: Marlin Canary --------------------------------------------------------------------------------  Indications:       CVA. Risk Factors:      Hypertension. Other Factors:     Afib, PM. Comparison Study:  No previous exams Performing Technologist: Jody Hill RVT, RDMS  Examination Guidelines: A complete evaluation includes B-mode imaging, spectral Doppler, color Doppler, and power Doppler as needed of all accessible portions of each vessel. Bilateral testing is considered an integral part of a complete examination. Limited examinations for reoccurring indications may be performed as noted.  Right Carotid Findings: +----------+--------+--------+--------+------------------+------------------+           PSV cm/sEDV cm/sStenosisPlaque DescriptionComments           +----------+--------+--------+--------+------------------+------------------+ CCA Prox  41      14                                intimal thickening +----------+--------+--------+--------+------------------+------------------+ CCA Distal36      12              heterogenous      intimal  thickening +----------+--------+--------+--------+------------------+------------------+ ICA Prox  38      17                                                   +----------+--------+--------+--------+------------------+------------------+ ICA Distal61      27                                tortuous           +----------+--------+--------+--------+------------------+------------------+ ECA       41      8                                                    +----------+--------+--------+--------+------------------+------------------+ +----------+--------+-------+----------------+-------------------+           PSV cm/sEDV cmsDescribe        Arm Pressure (mmHG) +----------+--------+-------+----------------+-------------------+ [id number]             Multiphasic, WNL                    +----------+--------+-------+----------------+-------------------+ +---------+--------+--+--------+-+---------+ VertebralPSV cm/s19EDV cm/s8Antegrade +---------+--------+--+--------+-+---------+  Left Carotid Findings: +----------+--------+--------+--------+------------------+------------------+           PSV cm/sEDV cm/sStenosisPlaque DescriptionComments           +----------+--------+--------+--------+------------------+------------------+ CCA Prox  42      11                                                   +----------+--------+--------+--------+------------------+------------------+ CCA Distal36      12              focal and calcificintimal thickening +----------+--------+--------+--------+------------------+------------------+ ICA Prox  35      17                                                   +----------+--------+--------+--------+------------------+------------------+  ICA Distal38      16                                                   +----------+--------+--------+--------+------------------+------------------+ ECA       38      7                                                     +----------+--------+--------+--------+------------------+------------------+ +----------+--------+--------+----------------+-------------------+           PSV cm/sEDV cm/sDescribe        Arm Pressure (mmHG) +----------+--------+--------+----------------+-------------------+ [id number]              Multiphasic, WNL                    +----------+--------+--------+----------------+-------------------+ +---------+--------+--+--------+-+---------+ VertebralPSV cm/s18EDV cm/s6Antegrade +---------+--------+--+--------+-+---------+   Summary: Right Carotid: The extracranial vessels were near-normal with Ortiz minimal wall                thickening or plaque. Left Carotid: The extracranial vessels were near-normal with Ortiz minimal wall               thickening or plaque. Vertebrals:  Bilateral vertebral arteries demonstrate antegrade flow. Subclavians: Normal flow hemodynamics were seen in bilateral subclavian              arteries. *See table(s) above for measurements and observations.     Preliminary    MR BRAIN WO CONTRAST  Result Date: 11/24/2022 CLINICAL DATA:  Initial evaluation for neuro deficit, stroke. EXAM: MRI HEAD WITHOUT CONTRAST TECHNIQUE: Multiplanar, multiecho pulse sequences of the brain and surrounding structures were obtained without intravenous contrast. COMPARISON:  CT from 11/23/2022. FINDINGS: Brain: Examination degraded by motion artifact. Cerebral volume within normal limits. Mild chronic microvascular ischemic disease for age. Patchy scattered subcentimeter foci of restricted diffusion are seen involving the bilateral cerebral and cerebellar hemispheres. Patchy cortical and subcortical involvement. Overall distribution is watershed in nature. No associated hemorrhage or mass effect. Gray-white matter differentiation maintained. No areas of chronic cortical infarction. No acute or chronic intracranial blood products. No mass lesion,  midline shift or mass effect. No hydrocephalus or extra-axial fluid collection. Pituitary gland suprasellar region within normal limits. Vascular: Major intracranial vascular flow voids are maintained. Skull and upper cervical spine: Craniocervical junction within normal limits. Bone marrow signal intensity within normal limits. No scalp soft tissue abnormality. Sinuses/Orbits: Globes orbital soft tissues within normal limits. Paranasal sinuses are clear. No mastoid effusion. Other: None. IMPRESSION: 1. Scattered subcentimeter acute ischemic nonhemorrhagic infarcts involving the bilateral cerebral and cerebellar hemispheres, watershed in distribution. Overall pattern favors a hypoperfusion injury. 2. Underlying mild chronic microvascular ischemic disease for age. Electronically Signed   By: Rise Mu M.D.   On: 11/24/2022 18:18   ECHOCARDIOGRAM COMPLETE  Result Date: 11/24/2022    ECHOCARDIOGRAM REPORT   Patient Name:   [facility] Date of Exam: 11/24/2022 Medical Rec #:  [id number]     Height:       66.0 in Accession #:    [id number]    Weight:       121.0 lb Date of Birth:  10-Jul-1935     BSA:  1.615 m Patient Age:    86 years      BP:           165/86 mmHg Patient Gender: F             HR:           66 bpm. Exam Location:  Inpatient Procedure: 2D Echo, 3D Echo, Cardiac Doppler and Color Doppler Indications:    R07.9* Chest pain, unspecified. Acute AMS.  History:        Patient has prior history of Echocardiogram examinations, most                 recent 12/27/2018. Abnormal ECG and Pacemaker, Mitral Valve                 Prolapse, Mitral Valve Disease and Aortic Valve Disease,                 Arrythmias:Atrial Fibrillation and Bradycardia,                 Signs/Symptoms:Syncope; Risk Factors:Hypertension. Cancer.  Sonographer:    Sheralyn Boatman RDCS Referring Phys: [id number] Jonita Albee IMPRESSIONS  1. Left ventricular ejection fraction, by estimation, is 45 to 50%. The left ventricle has  mildly decreased function. The left ventricle has no regional wall motion abnormalities. There is mild concentric left ventricular hypertrophy. Left ventricular diastolic parameters are consistent with Grade II diastolic dysfunction (pseudonormalization).  2. Right ventricular systolic function is normal. The right ventricular size is normal. There is moderately elevated pulmonary artery systolic pressure. The estimated right ventricular systolic pressure is 52.2 mmHg.  3. Left atrial size was severely dilated.  4. Right atrial size was moderately dilated.  5. The mitral valve is grossly normal. Mild mitral valve regurgitation. No evidence of mitral stenosis. There is mild holosystolic prolapse of the middle scallop of the posterior leaflet of the mitral valve.  6. Tricuspid valve regurgitation is mild to moderate.  7. Focal calcification of the non coronary cusp . The aortic valve is tricuspid. Aortic valve regurgitation is not visualized. Aortic valve sclerosis/calcification is present, without any evidence of aortic stenosis.  8. The inferior vena cava is dilated in size with <50% respiratory variability, suggesting right atrial pressure of 15 mmHg. Comparison(s): Changes from prior study are noted. The left ventricular function is worsened. LVEF now 45-50%. Mild MR. Mild to moderate TR. RVSP 52 mmHG. FINDINGS  Left Ventricle: Left ventricular ejection fraction, by estimation, is 45 to 50%. The left ventricle has mildly decreased function. The left ventricle has no regional wall motion abnormalities. The left ventricular internal cavity size was normal in size. There is mild concentric left ventricular hypertrophy. Abnormal (paradoxical) septal motion, consistent with RV pacemaker. Left ventricular diastolic parameters are consistent with Grade II diastolic dysfunction (pseudonormalization). Right Ventricle: The right ventricular size is normal. No increase in right ventricular wall thickness. Right ventricular  systolic function is normal. There is moderately elevated pulmonary artery systolic pressure. The tricuspid regurgitant velocity is 3.05 m/s, and with an assumed right atrial pressure of 15 mmHg, the estimated right ventricular systolic pressure is 52.2 mmHg. Left Atrium: Left atrial size was severely dilated. Right Atrium: Right atrial size was moderately dilated. Pericardium: There is no evidence of pericardial effusion. Mitral Valve: The mitral valve is grossly normal. There is mild holosystolic prolapse of the middle scallop of the posterior leaflet of the mitral valve. There is moderate thickening of the mitral valve leaflet(s). Mild mitral valve regurgitation.  No evidence of mitral valve stenosis. Tricuspid Valve: The tricuspid valve is grossly normal. Tricuspid valve regurgitation is mild to moderate. No evidence of tricuspid stenosis. Aortic Valve: Focal calcification of the non coronary cusp. The aortic valve is tricuspid. Aortic valve regurgitation is not visualized. Aortic valve sclerosis/calcification is present, without any evidence of aortic stenosis. Pulmonic Valve: The pulmonic valve was grossly normal. Pulmonic valve regurgitation is trivial. No evidence of pulmonic stenosis. Aorta: The aortic root and ascending aorta are structurally normal, with no evidence of dilitation. Venous: The inferior vena cava is dilated in size with less than 50% respiratory variability, suggesting right atrial pressure of 15 mmHg. IAS/Shunts: There is right bowing of the interatrial septum, suggestive of elevated left atrial pressure. The atrial septum is grossly normal. Additional Comments: A device lead is visualized in the right atrium and right ventricle.  LEFT VENTRICLE PLAX 2D LVIDd:         4.00 cm   Diastology LVIDs:         3.30 cm   LV e' medial:    5.11 cm/s LV PW:         1.30 cm   LV E/e' medial:  12.3 LV IVS:        1.20 cm   LV e' lateral:   6.96 cm/s LVOT diam:     2.20 cm   LV E/e' lateral: 9.0 LV SV:          85 LV SV Index:   52 LVOT Area:     3.80 cm                           3D Volume EF:                          3D EF:        50 % RIGHT VENTRICLE             IVC RV S prime:     14.70 cm/s  IVC diam: 1.95 cm TAPSE (M-mode): 2.4 cm LEFT ATRIUM              Index        RIGHT ATRIUM           Index LA diam:        5.90 cm  3.65 cm/m   RA Area:     18.00 cm LA Vol (A2C):   128.0 ml 79.25 ml/m  RA Volume:   41.40 ml  25.63 ml/m LA Vol (A4C):   86.8 ml  53.74 ml/m LA Biplane Vol: 110.0 ml 68.10 ml/m  AORTIC VALVE             PULMONIC VALVE LVOT Vmax:   118.00 cm/s PR End Diast Vel: 1.91 msec LVOT Vmean:  73.100 cm/s LVOT VTI:    0.223 m  AORTA Ao Root diam: 2.90 cm Ao Asc diam:  3.20 cm MITRAL VALVE                  TRICUSPID VALVE MV Area (PHT): 3.39 cm       TR Peak grad:   37.2 mmHg MV Decel Time: 224 msec       TR Vmax:        305.00 cm/s MR Peak grad:    95.6 mmHg MR Mean grad:    60.0 mmHg    SHUNTS MR Vmax:  489.00 cm/s  Systemic VTI:  0.22 m MR Vmean:        360.0 cm/s   Systemic Diam: 2.20 cm MR PISA:         0.57 cm MR PISA Eff ROA: 4 mm MR PISA Radius:  0.30 cm MV E velocity: 62.60 cm/s MV A velocity: 40.25 cm/s MV E/A ratio:  1.56 Lennie Odor MD Electronically signed by Lennie Odor MD Signature Date/Time: 11/24/2022/12:16:24 PM    Final     Cardiac Studies   Echo:    1. Left ventricular ejection fraction, by estimation, is 45 to 50%. The  left ventricle has mildly decreased function. The left ventricle has no  regional wall motion abnormalities. There is mild concentric left  ventricular hypertrophy. Left ventricular  diastolic parameters are consistent with Grade II diastolic dysfunction  (pseudonormalization).   2. Right ventricular systolic function is normal. The right ventricular  size is normal. There is moderately elevated pulmonary artery systolic  pressure. The estimated right ventricular systolic pressure is 52.2 mmHg.   3. Left atrial size was severely  dilated.   4. Right atrial size was moderately dilated.   5. The mitral valve is grossly normal. Mild mitral valve regurgitation.  No evidence of mitral stenosis. There is mild holosystolic prolapse of the  middle scallop of the posterior leaflet of the mitral valve.   6. Tricuspid valve regurgitation is mild to moderate.   7. Focal calcification of the non coronary cusp . The aortic valve is  tricuspid. Aortic valve regurgitation is not visualized. Aortic valve  sclerosis/calcification is present, without any evidence of aortic  stenosis.   8. The inferior vena cava is dilated in size with <50% respiratory  variability, suggesting right atrial pressure of 15 mmHg.   Patient Profile     87 y.o. female with a hx of sinus node dysfunction s/p PPM, HTN, atrial fibrillation on eliquis, pancreatic cancer who was being seen 11/24/2022 for the evaluation of afib with RVR, elevated troponin at the request of Dr. Sharl Ma.   Assessment & Plan    Atrial fib:  PAF.  Continue Eliquis.  Difficult to rate control without dropping her BP which we would like to avoid and without her taking PO meds.  I will start IV amiodarone.    Elevated troponin:   For now conservative management.  There are no regional wall motion abnormalities and no acute EKG changes.  She has had no recent symptoms and is not a candidate at this point for invasive or stress testing.     Cardiomyopathy:   Avoiding ARNI/ARB with ACE allergy.  On Spironolactone.     AMS:   MRI with scattered subcentimeter acute ischemic nonhemorrhagic infarcts involving the bilateral cerebral and cerebellar hemispheres,watershed in distribution. Overall pattern favors a hypoperfusion injury.  Plan per primary service.      For questions or updates, please contact [facility] Please consult [email or web address] for contact info under Cardiology/STEMI.   Signed, Rollene Rotunda, MD  11/25/2022, 11:51 AM

## 2022-11-25 NOTE — Progress Notes (Signed)
Around 1330, patient converted from Afib in 150s to normal sinus in 70s, without the intervention of an amiodarone drip. Reached out to hospitalist and was told to hold medication for now.

## 2022-11-25 NOTE — Progress Notes (Signed)
ANTICOAGULATION CONSULT NOTE - Initial Consult  Pharmacy Consult for heparin  Indication: atrial fibrillation - bridge therapy while apixaban on hold  Allergies  Allergen Reactions   Ace Inhibitors Cough   Elavil [Amitriptyline Hcl] Hives   Ketek [Telithromycin] Hives    Patient Measurements: Height: 5\' 6"  (167.6 cm) Weight: 54.9 kg (121 lb) IBW/kg (Calculated) : 59.3 Heparin Dosing Weight: 54.9 kg  Vital Signs: Temp: 98.9 F (37.2 C) (06/12 0650) Temp Source: Oral (06/12 0650) BP: 148/98 (06/12 0650) Pulse Rate: 124 (06/12 0650)  Labs: Recent Labs    11/23/22 0839 11/23/22 1140 11/23/22 1309 11/24/22 0424 11/24/22 1046 11/24/22 1407  HGB 10.8*  --   --  9.0*  --   --   HCT 33.1*  --   --  28.0*  --   --   PLT 131*  --   --  102*  --   --   CREATININE 1.00  --   --  0.76  --   --   TROPONINIHS 158*   < > 330*  --  454* 442*   < > = values in this interval not displayed.    Estimated Creatinine Clearance: 45.4 mL/min (by C-G formula based on SCr of 0.76 mg/dL).   Medical History: Past Medical History:  Diagnosis Date   Acquired diverticulum of distal esophagus with dysphagia 10/03/2018   AKI (acute kidney injury) (HCC) 08/31/2018   Anxiety    Bradycardia 02/16/2014   DDD (degenerative disc disease), lumbar    DJD (degenerative joint disease)    Esophageal stenosis 08/31/2018   Fibromyalgia    Hemorrhoid    Hypertension    Hypothyroidism    Osteopenia    Sigmoid diverticulosis    Thyroid nodule    PT DENIES    Medications:  Medications Prior to Admission  Medication Sig Dispense Refill Last Dose   amLODipine (NORVASC) 10 MG tablet Take 1 tablet (10 mg total) by mouth daily. Please call our office to schedule an appt for future refills. Thank you 2nd attempt. 15 tablet 0 11/22/2022   apixaban (ELIQUIS) 2.5 MG TABS tablet Take 1 tablet (2.5 mg total) by mouth 2 (two) times daily. 180 tablet 1 11/22/2022 at 1700   losartan (COZAAR) 100 MG tablet Take 100  mg by mouth in the morning.   11/22/2022   metoprolol succinate (TOPROL-XL) 50 MG 24 hr tablet Take 50 mg by mouth in the morning.   11/22/2022 at 0900   pantoprazole (PROTONIX) 20 MG tablet Take 20 mg by mouth daily.   11/22/2022   potassium chloride SA (KLOR-CON M) 20 MEQ tablet Take 1 tablet (20 mEq total) by mouth daily. 30 tablet 2 11/22/2022   docusate sodium (COLACE) 100 MG capsule Take 100 mg by mouth 2 (two) times daily. (Patient not taking: Reported on 11/23/2022)   Not Taking   feeding supplement (ENSURE SURGERY) LIQD Take 237 mLs by mouth 2 (two) times daily between meals. (Patient taking differently: Take 237 mLs by mouth 3 (three) times a week.) 60 Bottle 0    ondansetron (ZOFRAN-ODT) 4 MG disintegrating tablet Take 4 mg by mouth every 8 (eight) hours as needed for nausea. (Patient not taking: Reported on 11/23/2022)   Completed Course   pantoprazole (PROTONIX) 40 MG tablet Take 1 tablet (40 mg total) by mouth daily. (Patient not taking: Reported on 11/23/2022) 30 tablet 6 Not Taking   polyethylene glycol (MIRALAX) 17 g packet Take 17 g by mouth daily. (Patient not taking:  Reported on 01/21/2022) 30 each 3 Not Taking   potassium chloride (KLOR-CON) 20 MEQ packet 1 packet (20 meq) 2 times daily for 3 days. Then 1 packet (20 meq) daily for 2 weeks (Patient not taking: Reported on 11/23/2022) 20 packet 0 Not Taking    Assessment: 87 yo F on apixaban 2.5 mg po bid PTA for PAF.  Pharmacy consulted to dose heparin for bridge therapy while pt NPO for new watershed stroke on MRI 11/24/22. Last dose of apixaban 2.5 mg 6/11 at 0841 am. SCr WNL Hg 10.8> 9, Was 10.3 05/15/22 PLT low 131> 102. Was 176 on 05/16/11  Goal of Therapy:  Heparin level 0.3 - 0.5 units/ml aPTT 66-85 seconds Monitor platelets by anticoagulation protocol: Yes   Plan:  Draw baseline aPTT & heparin level now> expect elevated heparin level due to apixaban No bolus per MD orders.  Will aim for lower stroke goal of aPTT 66-86 seconds &  heparin level 0.3 - 0.5 & no boluses Start heparin at 700 units/hr and check 8 hr aPTT Daily CBC, aPTT, heparin level   Herby Abraham, Pharm.D Use secure chat for questions 11/25/2022 10:13 AM

## 2022-11-25 NOTE — Progress Notes (Signed)
PT Cancellation Note  Patient Details Name: Shannon Ortiz MRN: [id number] DOB: [birth year]   Cancelled Treatment:    Reason Eval/Treat Not Completed: Other (comment). Pt preparing to go for swallow study. Will check back for PT eval as appropriate.     Tori Hedda Crumbley PT, DPT 11/25/22, 1:24 PM

## 2022-11-25 NOTE — Progress Notes (Addendum)
Paged cardiology for administration Amiodarone. RN wait for further instruction.

## 2022-11-25 NOTE — Progress Notes (Signed)
Carotid duplex has been completed.   Results can be found under chart review under CV PROC. 11/25/2022 10:40 AM Nicolemarie Wooley RVT, RDMS

## 2022-11-25 NOTE — Progress Notes (Signed)
Modified Barium Swallow Study  Patient Details  Name: Shannon Ortiz MRN: [id number] Date of Birth: 03-20-1936  Today's Date: 11/25/2022  Modified Barium Swallow completed.  Full report located under Chart Review in the Imaging Section.  History of Present Illness 87 y.o. female with medical history pancreatic ca, cardiac issues, adm with nonspecific weakness x few weeks, found to be in rapid atrial fibrillation. MRI brain: Scattered subcentimeter acute ischemic nonhemorrhagic infarcts involving the bilateral cerebral and cerebellar hemispheres, watershed in distribution.  Swallow and speech/language evaluation ordered.   Clinical Impression Patient presents with severe oral and minimal pharyngeal dysphagia.  Lingual coordination and strength is severely impaired.  These lingual deficits resulting in excessively delayed oral transit with severe lingual pumping often resulting in boluses spilling into pharynx uncontrolled.  Patient required upwards of 40 seconds to transit small boluses of pudding mixed with apple sauce and once bolus was posterior to essentially gradually spilled further and to the pharynx without patient reflexively swallowing. She finally swallowed pudding and poorly masticated cracker with thin liquids.  Various techniques including counting 1-3 swallow, cued swallowing,  dry spoon to the mouth to help trigger swallow etc. were not effective.  Pharyngeal swallow is largely intact once it triggers and patient has very large piriform sinuses that allows pooling of liquids prior to swallowing trigger.  She is at severe risk of malnutrition, dehydration with current level of dysphagia.  SLP advises patient start a full liquid diet (no pudding or grits) with very strict precautions including oral suctioning if she does not swallow and observing laryngeal elevation to assure swallow is triggered.  Patient did not cough nor clear her throat during this exam reflexively as was clinically  observed during bedside swallow evaluation.  Suspect she has at least occasional laryngeal penetration of boluses or/and secretions to vocal folds.  Per family at baseline patient is feeding herself and managing swallowing well.  Based on their statement,  this appears to be a significant decline.   SLP will follow closely for p.o. tolerance, readiness for dietary advancement. Factors that may increase risk of adverse event in presence of aspiration [employer]ye Oaks [employer] Clearance Coots 2021): Limited mobility;Weak cough;Reduced cognitive function;Dependence for feeding and/or oral hygiene  Swallow Evaluation Recommendations Recommendations: PO diet PO Diet Recommendation:  (Full liquid diet no pudding or grits) Liquid Administration via: Straw;Cup Medication Administration: Via alternative means (Suspension, crushed with liquid, or via IV) Supervision: Full supervision/cueing for swallowing strategies Swallowing strategies  : Minimize environmental distractions;Slow rate;Small bites/sips (Provide liquids to aid swallowing with thicker consistencies.  Oral suction if patient does not swallow) Postural changes: Position pt fully upright for meals;Stay upright 30-60 min after meals (Patient must be fully upright for all p.o.) Oral care recommendations: Oral care BID (2x/day) Recommended consults: Consider Palliative care Caregiver Recommendations: Have oral suction available   [name], MS [facility] SLP Acute Rehab Services Office [phone]    Chales Abrahams 11/25/2022,3:19 PM

## 2022-11-25 NOTE — Progress Notes (Signed)
PROGRESS NOTE    Shannon Ortiz  WUJ:[id number] DOB: [birth year] DOA: 11/23/2022 PCP: Daisy Floro, MD    Brief Narrative:  87 y.o. female with medical history significant for sinus node dysfunction with pacemaker, hypertension, atrial fibrillation on Eliquis and pancreatic cancer undergoing surveillance, being admitted to the hospital for a couple of weeks of nonspecific weakness, found to be in rapid atrial fibrillation.  Also patient has been confused which is changed from her baseline.  CT head was unremarkable. In the ED she was given IV metoprolol and was converted back to normal sinus rhythm.  She was found to have significantly elevated troponin, cardiology was consulted.   Assessment and Plan: CVA -MRI: Scattered subcentimeter acute ischemic nonhemorrhagic infarcts involving the bilateral cerebral and cerebellar hemispheres, watershed in distribution. Overall pattern favors a hypoperfusion injury. -FLP, HgbA1c -SLP consult -PT/OT consult -neurology consult   Paroxysmal atrial fibrillation with RVR -IV BB -IV heparin per neurology until able to take PO   Elevated troponin -No chest pain, likely from demand ischemia from atrial fibrillation with RVR -Cardiology consulted; echocardiogram shows reduced EF ventricular hypokinesis -Cardiology following   Hypertension -allow permissive due to CVA -change to IV as NPO   DVT prophylaxis: SCDs Start: 11/23/22 1116    Code Status: Full Code Family Communication: at bedside  Disposition Plan:  Level of care: Progressive Status is: Inpatient Remains inpatient appropriate     Consultants:  Cards neuro   Subjective: Not speaking much- not her baseline per family  Objective: Vitals:   11/24/22 1314 11/24/22 2033 11/25/22 0000 11/25/22 0650  BP: (!) 156/81 (!) 143/74  (!) 148/98  Pulse: 63 64  (!) 124  Resp:  18 20 17   Temp: 99.4 F (37.4 C) 99.1 F (37.3 C)  98.9 F (37.2 C)  TempSrc: Oral Oral  Oral   SpO2: 98% 100%  97%  Weight:      Height:        Intake/Output Summary (Last 24 hours) at 11/25/2022 1209 Last data filed at 11/25/2022 0600 Gross per 24 hour  Intake 485.46 ml  Output --  Net 485.46 ml   Filed Weights   11/23/22 0809  Weight: 54.9 kg    Examination:   General: Appearance:    Thin female in no acute distress     Lungs:      respirations unlabored  Heart:    Tachycardic.    MS:   All extremities are intact.    Neurologic:   Awake, alert, not following commands       Data Reviewed: I have personally reviewed following labs and imaging studies  CBC: Recent Labs  Lab 11/23/22 0839 11/24/22 0424  WBC 13.6* 8.6  HGB 10.8* 9.0*  HCT 33.1* 28.0*  MCV 72.6* 75.7*  PLT 131* 102*   Basic Metabolic Panel: Recent Labs  Lab 11/23/22 0839 11/24/22 0424  NA 133* 132*  K 3.3* 3.7  CL 98 102  CO2 24 23  GLUCOSE 136* 97  BUN 20 17  CREATININE 1.00 0.76  CALCIUM 9.6 8.8*   GFR: Estimated Creatinine Clearance: 45.4 mL/min (by C-G formula based on SCr of 0.76 mg/dL). Liver Function Tests: Recent Labs  Lab 11/23/22 0839  AST 48*  ALT 21  ALKPHOS 167*  BILITOT 1.0  PROT 7.9  ALBUMIN 3.6   No results for input(s): "LIPASE", "AMYLASE" in the last 168 hours. No results for input(s): "AMMONIA" in the last 168 hours. Coagulation Profile: No results for input(s): "INR", "  PROTIME" in the last 168 hours. Cardiac Enzymes: No results for input(s): "CKTOTAL", "CKMB", "CKMBINDEX", "TROPONINI" in the last 168 hours. BNP (last 3 results) No results for input(s): "PROBNP" in the last 8760 hours. HbA1C: Recent Labs    11/25/22 0827  HGBA1C 4.9   CBG: Recent Labs  Lab 11/23/22 0830  GLUCAP 127*   Lipid Profile: Recent Labs    11/25/22 0827  CHOL 194  HDL 76  LDLCALC 102*  TRIG 80  CHOLHDL 2.6   Thyroid Function Tests: No results for input(s): "TSH", "T4TOTAL", "FREET4", "T3FREE", "THYROIDAB" in the last 72 hours. Anemia Panel: No  results for input(s): "VITAMINB12", "FOLATE", "FERRITIN", "TIBC", "IRON", "RETICCTPCT" in the last 72 hours. Sepsis Labs: No results for input(s): "PROCALCITON", "LATICACIDVEN" in the last 168 hours.  Recent Results (from the past 240 hour(s))  SARS Coronavirus 2 by RT PCR (hospital order, performed in [facility] hospital lab) *cepheid single result test* Anterior Nasal Swab     Status: None   Collection Time: 11/23/22  9:04 AM   Specimen: Anterior Nasal Swab  Result Value Ref Range Status   SARS Coronavirus 2 by RT PCR NEGATIVE NEGATIVE Final    Comment: (NOTE) SARS-CoV-2 target nucleic acids are NOT DETECTED.  The SARS-CoV-2 RNA is generally detectable in upper and lower respiratory specimens during the acute phase of infection. The lowest concentration of SARS-CoV-2 viral copies this assay can detect is 250 copies / mL. A negative result does not preclude SARS-CoV-2 infection and should not be used as the sole basis for treatment or other patient management decisions.  A negative result may occur with improper specimen collection / handling, submission of specimen other than nasopharyngeal swab, presence of viral mutation(s) within the areas targeted by this assay, and inadequate number of viral copies (<250 copies / mL). A negative result must be combined with clinical observations, patient history, and epidemiological information.  Fact Sheet for Patients:   RoadLapTop.co.za  Fact Sheet for Healthcare Providers: http://kim-miller.com/  This test is not yet approved or  cleared by the [place] FDA and has been authorized for detection and/or diagnosis of SARS-CoV-2 by FDA under an Emergency Use Authorization (EUA).  This EUA will remain in effect (meaning this test can be used) for the duration of the COVID-19 declaration under Section 564(b)(1) of the Act, 21 U.S.C. section 360bbb-3(b)(1), unless the authorization is  terminated or revoked sooner.  Performed at [facility], [address], [place], [place] [address]          Radiology Studies: VAS US CAROTID (at [facility] and [facility] only)  Result Date: 11/25/2022 Carotid Arterial Duplex Study Patient Name:  [facility]  Date of Exam:   11/25/2022 Medical Rec #: [id number]      Accession #:    [id number] Date of Birth: [birth year]      Patient Gender: F Patient Age:   6 years Exam Location:  [facility] Procedure:      VAS US CAROTID Referring Phys: Marlin Canary --------------------------------------------------------------------------------  Indications:       CVA. Risk Factors:      Hypertension. Other Factors:     Afib, PM. Comparison Study:  No previous exams Performing Technologist: Jody Hill RVT, RDMS  Examination Guidelines: A complete evaluation includes B-mode imaging, spectral Doppler, color Doppler, and power Doppler as needed of all accessible portions of each vessel. Bilateral testing is considered an integral part of a complete examination. Limited examinations for reoccurring indications may be  performed as noted.  Right Carotid Findings: +----------+--------+--------+--------+------------------+------------------+           PSV cm/sEDV cm/sStenosisPlaque DescriptionComments           +----------+--------+--------+--------+------------------+------------------+ CCA Prox  41      14                                intimal thickening +----------+--------+--------+--------+------------------+------------------+ CCA Distal36      12              heterogenous      intimal thickening +----------+--------+--------+--------+------------------+------------------+ ICA Prox  38      17                                                   +----------+--------+--------+--------+------------------+------------------+ ICA Distal61      27                                tortuous            +----------+--------+--------+--------+------------------+------------------+ ECA       41      8                                                    +----------+--------+--------+--------+------------------+------------------+ +----------+--------+-------+----------------+-------------------+           PSV cm/sEDV cmsDescribe        Arm Pressure (mmHG) +----------+--------+-------+----------------+-------------------+ [id number]             Multiphasic, WNL                    +----------+--------+-------+----------------+-------------------+ +---------+--------+--+--------+-+---------+ VertebralPSV cm/s19EDV cm/s8Antegrade +---------+--------+--+--------+-+---------+  Left Carotid Findings: +----------+--------+--------+--------+------------------+------------------+           PSV cm/sEDV cm/sStenosisPlaque DescriptionComments           +----------+--------+--------+--------+------------------+------------------+ CCA Prox  42      11                                                   +----------+--------+--------+--------+------------------+------------------+ CCA Distal36      12              focal and calcificintimal thickening +----------+--------+--------+--------+------------------+------------------+ ICA Prox  35      17                                                   +----------+--------+--------+--------+------------------+------------------+ ICA Distal38      16                                                   +----------+--------+--------+--------+------------------+------------------+ ECA       38  7                                                    +----------+--------+--------+--------+------------------+------------------+ +----------+--------+--------+----------------+-------------------+           PSV cm/sEDV cm/sDescribe        Arm Pressure (mmHG) +----------+--------+--------+----------------+-------------------+  [id number]              Multiphasic, WNL                    +----------+--------+--------+----------------+-------------------+ +---------+--------+--+--------+-+---------+ VertebralPSV cm/s18EDV cm/s6Antegrade +---------+--------+--+--------+-+---------+   Summary: Right Carotid: The extracranial vessels were near-normal with only minimal wall                thickening or plaque. Left Carotid: The extracranial vessels were near-normal with only minimal wall               thickening or plaque. Vertebrals:  Bilateral vertebral arteries demonstrate antegrade flow. Subclavians: Normal flow hemodynamics were seen in bilateral subclavian              arteries. *See table(s) above for measurements and observations.     Preliminary    MR BRAIN WO CONTRAST  Result Date: 11/24/2022 CLINICAL DATA:  Initial evaluation for neuro deficit, stroke. EXAM: MRI HEAD WITHOUT CONTRAST TECHNIQUE: Multiplanar, multiecho pulse sequences of the brain and surrounding structures were obtained without intravenous contrast. COMPARISON:  CT from 11/23/2022. FINDINGS: Brain: Examination degraded by motion artifact. Cerebral volume within normal limits. Mild chronic microvascular ischemic disease for age. Patchy scattered subcentimeter foci of restricted diffusion are seen involving the bilateral cerebral and cerebellar hemispheres. Patchy cortical and subcortical involvement. Overall distribution is watershed in nature. No associated hemorrhage or mass effect. Gray-white matter differentiation maintained. No areas of chronic cortical infarction. No acute or chronic intracranial blood products. No mass lesion, midline shift or mass effect. No hydrocephalus or extra-axial fluid collection. Pituitary gland suprasellar region within normal limits. Vascular: Major intracranial vascular flow voids are maintained. Skull and upper cervical spine: Craniocervical junction within normal limits. Bone marrow signal intensity within normal  limits. No scalp soft tissue abnormality. Sinuses/Orbits: Globes orbital soft tissues within normal limits. Paranasal sinuses are clear. No mastoid effusion. Other: None. IMPRESSION: 1. Scattered subcentimeter acute ischemic nonhemorrhagic infarcts involving the bilateral cerebral and cerebellar hemispheres, watershed in distribution. Overall pattern favors a hypoperfusion injury. 2. Underlying mild chronic microvascular ischemic disease for age. Electronically Signed   By: Rise Mu M.D.   On: 11/24/2022 18:18   ECHOCARDIOGRAM COMPLETE  Result Date: 11/24/2022    ECHOCARDIOGRAM REPORT   Patient Name:   [facility] Date of Exam: 11/24/2022 Medical Rec #:  [id number]     Height:       66.0 in Accession #:    [id number]    Weight:       121.0 lb Date of Birth:  1935-08-11     BSA:          1.615 m Patient Age:    86 years      BP:           165/86 mmHg Patient Gender: F             HR:           66 bpm. Exam Location:  Inpatient Procedure: 2D Echo, 3D Echo, Cardiac  Doppler and Color Doppler Indications:    R07.9* Chest pain, unspecified. Acute AMS.  History:        Patient has prior history of Echocardiogram examinations, most                 recent 12/27/2018. Abnormal ECG and Pacemaker, Mitral Valve                 Prolapse, Mitral Valve Disease and Aortic Valve Disease,                 Arrythmias:Atrial Fibrillation and Bradycardia,                 Signs/Symptoms:Syncope; Risk Factors:Hypertension. Cancer.  Sonographer:    Sheralyn Boatman RDCS Referring Phys: [id number] Jonita Albee IMPRESSIONS  1. Left ventricular ejection fraction, by estimation, is 45 to 50%. The left ventricle has mildly decreased function. The left ventricle has no regional wall motion abnormalities. There is mild concentric left ventricular hypertrophy. Left ventricular diastolic parameters are consistent with Grade II diastolic dysfunction (pseudonormalization).  2. Right ventricular systolic function is normal. The right  ventricular size is normal. There is moderately elevated pulmonary artery systolic pressure. The estimated right ventricular systolic pressure is 52.2 mmHg.  3. Left atrial size was severely dilated.  4. Right atrial size was moderately dilated.  5. The mitral valve is grossly normal. Mild mitral valve regurgitation. No evidence of mitral stenosis. There is mild holosystolic prolapse of the middle scallop of the posterior leaflet of the mitral valve.  6. Tricuspid valve regurgitation is mild to moderate.  7. Focal calcification of the non coronary cusp . The aortic valve is tricuspid. Aortic valve regurgitation is not visualized. Aortic valve sclerosis/calcification is present, without any evidence of aortic stenosis.  8. The inferior vena cava is dilated in size with <50% respiratory variability, suggesting right atrial pressure of 15 mmHg. Comparison(s): Changes from prior study are noted. The left ventricular function is worsened. LVEF now 45-50%. Mild MR. Mild to moderate TR. RVSP 52 mmHG. FINDINGS  Left Ventricle: Left ventricular ejection fraction, by estimation, is 45 to 50%. The left ventricle has mildly decreased function. The left ventricle has no regional wall motion abnormalities. The left ventricular internal cavity size was normal in size. There is mild concentric left ventricular hypertrophy. Abnormal (paradoxical) septal motion, consistent with RV pacemaker. Left ventricular diastolic parameters are consistent with Grade II diastolic dysfunction (pseudonormalization). Right Ventricle: The right ventricular size is normal. No increase in right ventricular wall thickness. Right ventricular systolic function is normal. There is moderately elevated pulmonary artery systolic pressure. The tricuspid regurgitant velocity is 3.05 m/s, and with an assumed right atrial pressure of 15 mmHg, the estimated right ventricular systolic pressure is 52.2 mmHg. Left Atrium: Left atrial size was severely dilated. Right  Atrium: Right atrial size was moderately dilated. Pericardium: There is no evidence of pericardial effusion. Mitral Valve: The mitral valve is grossly normal. There is mild holosystolic prolapse of the middle scallop of the posterior leaflet of the mitral valve. There is moderate thickening of the mitral valve leaflet(s). Mild mitral valve regurgitation. No evidence of mitral valve stenosis. Tricuspid Valve: The tricuspid valve is grossly normal. Tricuspid valve regurgitation is mild to moderate. No evidence of tricuspid stenosis. Aortic Valve: Focal calcification of the non coronary cusp. The aortic valve is tricuspid. Aortic valve regurgitation is not visualized. Aortic valve sclerosis/calcification is present, without any evidence of aortic stenosis. Pulmonic Valve: The pulmonic valve was grossly normal.  Pulmonic valve regurgitation is trivial. No evidence of pulmonic stenosis. Aorta: The aortic root and ascending aorta are structurally normal, with no evidence of dilitation. Venous: The inferior vena cava is dilated in size with less than 50% respiratory variability, suggesting right atrial pressure of 15 mmHg. IAS/Shunts: There is right bowing of the interatrial septum, suggestive of elevated left atrial pressure. The atrial septum is grossly normal. Additional Comments: A device lead is visualized in the right atrium and right ventricle.  LEFT VENTRICLE PLAX 2D LVIDd:         4.00 cm   Diastology LVIDs:         3.30 cm   LV e' medial:    5.11 cm/s LV PW:         1.30 cm   LV E/e' medial:  12.3 LV IVS:        1.20 cm   LV e' lateral:   6.96 cm/s LVOT diam:     2.20 cm   LV E/e' lateral: 9.0 LV SV:         85 LV SV Index:   52 LVOT Area:     3.80 cm                           3D Volume EF:                          3D EF:        50 % RIGHT VENTRICLE             IVC RV S prime:     14.70 cm/s  IVC diam: 1.95 cm TAPSE (M-mode): 2.4 cm LEFT ATRIUM              Index        RIGHT ATRIUM           Index LA diam:         5.90 cm  3.65 cm/m   RA Area:     18.00 cm LA Vol (A2C):   128.0 ml 79.25 ml/m  RA Volume:   41.40 ml  25.63 ml/m LA Vol (A4C):   86.8 ml  53.74 ml/m LA Biplane Vol: 110.0 ml 68.10 ml/m  AORTIC VALVE             PULMONIC VALVE LVOT Vmax:   118.00 cm/s PR End Diast Vel: 1.91 msec LVOT Vmean:  73.100 cm/s LVOT VTI:    0.223 m  AORTA Ao Root diam: 2.90 cm Ao Asc diam:  3.20 cm MITRAL VALVE                  TRICUSPID VALVE MV Area (PHT): 3.39 cm       TR Peak grad:   37.2 mmHg MV Decel Time: 224 msec       TR Vmax:        305.00 cm/s MR Peak grad:    95.6 mmHg MR Mean grad:    60.0 mmHg    SHUNTS MR Vmax:         489.00 cm/s  Systemic VTI:  0.22 m MR Vmean:        360.0 cm/s   Systemic Diam: 2.20 cm MR PISA:         0.57 cm MR PISA Eff ROA: 4 mm MR PISA Radius:  0.30 cm MV E velocity: 62.60 cm/s MV A velocity: 40.25 cm/s MV  E/A ratio:  1.56 Lennie Odor MD Electronically signed by Lennie Odor MD Signature Date/Time: 11/24/2022/12:16:24 PM    Final         Scheduled Meds:  [START ON 11/26/2022]  stroke: early stages of recovery book   Does not apply Once   docusate sodium  100 mg Oral BID   feeding supplement  237 mL Oral BID BM   metoprolol tartrate  5 mg Intravenous Q8H   Continuous Infusions:  sodium chloride 50 mL/hr at 11/25/22 1010   heparin 700 Units/hr (11/25/22 1109)     LOS: 1 day    Time spent: 45 minutes spent on chart review, discussion with nursing staff, consultants, updating family and interview/physical exam; more than 50% of that time was spent in counseling and/or coordination of care.    Joseph Art, DO [facility] Available via Epic secure chat 7am-7pm After these hours, please refer to coverage provider listed on [email or web address] 11/25/2022, 12:09 PM

## 2022-11-25 NOTE — Consult Note (Addendum)
Stroke Neurology Consultation Note  Consult Requested by: Joseph Art, DO  Reason for Consult: stroke  Consult Date: 11/25/22   87 year old with history of hypertension atrial fibrillation on Eliquis pancreatic cancer and sinus node dysfunction with a pacemaker.  Admitted to hospital for nonspecific weakness was found to be in atrial fibrillation.  Given IV metoprolol in the ER.  MRI brain showed scattered CVA.  Her grandson and another family members were in the room at the time of interview.  She is able to swallow  Past Medical History:  Diagnosis Date   Acquired diverticulum of distal esophagus with dysphagia 10/03/2018   AKI (acute kidney injury) (HCC) 08/31/2018   Anxiety    Bradycardia 02/16/2014   DDD (degenerative disc disease), lumbar    DJD (degenerative joint disease)    Esophageal stenosis 08/31/2018   Fibromyalgia    Hemorrhoid    Hypertension    Hypothyroidism    Osteopenia    Sigmoid diverticulosis    Thyroid nodule    PT DENIES    Past Surgical History:  Procedure Laterality Date   ABDOMINAL HYSTERECTOMY     COMPLETE   BALLOON DILATION N/A 09/01/2018   Procedure: BALLOON DILATION;  Surgeon: Kerin Salen, MD;  Location: [facility] ENDOSCOPY;  Service: Gastroenterology;  Laterality: N/A;   BIOPSY  09/01/2018   Procedure: BIOPSY;  Surgeon: Kerin Salen, MD;  Location: [facility] ENDOSCOPY;  Service: Gastroenterology;;   BIOPSY  01/26/2022   Procedure: BIOPSY;  Surgeon: Lemar Lofty., MD;  Location: [facility] ENDOSCOPY;  Service: Gastroenterology;;   BOTOX INJECTION  09/01/2018   Procedure: BOTOX INJECTION;  Surgeon: Kerin Salen, MD;  Location: [facility] ENDOSCOPY;  Service: Gastroenterology;;   BREAST SURGERY     breast biopsy   CHOLECYSTECTOMY     ESOPHAGEAL MANOMETRY N/A 10/07/2018   Procedure: ESOPHAGEAL MANOMETRY (EM);  Surgeon: Kerin Salen, MD;  Location: [facility] ENDOSCOPY;  Service: Gastroenterology;  Laterality: N/A;   ESOPHAGOGASTRODUODENOSCOPY N/A 10/25/2018   Procedure:  ESOPHAGOGASTRODUODENOSCOPY (EGD);  Surgeon: Karie Soda, MD;  Location: [facility] ORS;  Service: General;  Laterality: N/A;   ESOPHAGOGASTRODUODENOSCOPY N/A 01/26/2022   Procedure: ESOPHAGOGASTRODUODENOSCOPY (EGD);  Surgeon: Lemar Lofty., MD;  Location: [facility] ENDOSCOPY;  Service: Gastroenterology;  Laterality: N/A;   ESOPHAGOGASTRODUODENOSCOPY (EGD) WITH PROPOFOL N/A 09/01/2018   Procedure: ESOPHAGOGASTRODUODENOSCOPY (EGD) WITH PROPOFOL;  Surgeon: Kerin Salen, MD;  Location: [facility] ENDOSCOPY;  Service: Gastroenterology;  Laterality: N/A;   ESOPHAGOGASTRODUODENOSCOPY (EGD) WITH PROPOFOL N/A 12/30/2021   Procedure: ESOPHAGOGASTRODUODENOSCOPY (EGD) WITH PROPOFOL;  Surgeon: Willis Modena, MD;  Location: [facility] ENDOSCOPY;  Service: Gastroenterology;  Laterality: N/A;   EUS N/A 01/26/2022   Procedure: UPPER ENDOSCOPIC ULTRASOUND (EUS) RADIAL;  Surgeon: Lemar Lofty., MD;  Location: [facility] ENDOSCOPY;  Service: Gastroenterology;  Laterality: N/A;   FIDUCIAL MARKER PLACEMENT N/A 01/26/2022   Procedure: FIDUCIAL MARKER PLACEMENT;  Surgeon: Lemar Lofty., MD;  Location: [facility] ENDOSCOPY;  Service: Gastroenterology;  Laterality: N/A;   FINE NEEDLE ASPIRATION N/A 12/30/2021   Procedure: FINE NEEDLE ASPIRATION (FNA) LINEAR;  Surgeon: Willis Modena, MD;  Location: [facility] ENDOSCOPY;  Service: Gastroenterology;  Laterality: N/A;   FOREIGN BODY REMOVAL  01/26/2022   Procedure: FOREIGN BODY REMOVAL;  Surgeon: Meridee Score Netty Starring., MD;  Location: [facility] ENDOSCOPY;  Service: Gastroenterology;;   PACEMAKER IMPLANT N/A 03/06/2019   Procedure: PACEMAKER IMPLANT;  Surgeon: Marinus Maw, MD;  Location: [facility] INVASIVE CV LAB;  Service: Cardiovascular;  Laterality: N/A;   UPPER ESOPHAGEAL ENDOSCOPIC ULTRASOUND (EUS) Bilateral 12/30/2021   Procedure: UPPER ESOPHAGEAL ENDOSCOPIC  ULTRASOUND (EUS);  Surgeon: Willis Modena, MD;  Location: [facility] ENDOSCOPY;  Service: Gastroenterology;  Laterality: Bilateral;    Family History   Problem Relation Age of Onset   Hypertension Mother    Diabetes Mother        non-insulin dependant   CAD Brother    Cancer Maternal Aunt    Cancer Maternal Uncle     Social History:  reports that she has never smoked. She has never used smokeless tobacco. She reports that she does not drink alcohol and does not use drugs.  Allergies:  Allergies  Allergen Reactions   Ace Inhibitors Cough   Elavil [Amitriptyline Hcl] Hives   Ketek [Telithromycin] Hives    No current facility-administered medications on file prior to encounter.   Current Outpatient Medications on File Prior to Encounter  Medication Sig Dispense Refill   amLODipine (NORVASC) 10 MG tablet Take 1 tablet (10 mg total) by mouth daily. Please call our office to schedule an appt for future refills. Thank you 2nd attempt. 15 tablet 0   apixaban (ELIQUIS) 2.5 MG TABS tablet Take 1 tablet (2.5 mg total) by mouth 2 (two) times daily. 180 tablet 1   losartan (COZAAR) 100 MG tablet Take 100 mg by mouth in the morning.     metoprolol succinate (TOPROL-XL) 50 MG 24 hr tablet Take 50 mg by mouth in the morning.     pantoprazole (PROTONIX) 20 MG tablet Take 20 mg by mouth daily.     potassium chloride SA (KLOR-CON M) 20 MEQ tablet Take 1 tablet (20 mEq total) by mouth daily. 30 tablet 2   docusate sodium (COLACE) 100 MG capsule Take 100 mg by mouth 2 (two) times daily. (Patient not taking: Reported on 11/23/2022)     feeding supplement (ENSURE SURGERY) LIQD Take 237 mLs by mouth 2 (two) times daily between meals. (Patient taking differently: Take 237 mLs by mouth 3 (three) times a week.) 60 Bottle 0   ondansetron (ZOFRAN-ODT) 4 MG disintegrating tablet Take 4 mg by mouth every 8 (eight) hours as needed for nausea. (Patient not taking: Reported on 11/23/2022)     pantoprazole (PROTONIX) 40 MG tablet Take 1 tablet (40 mg total) by mouth daily. (Patient not taking: Reported on 11/23/2022) 30 tablet 6   polyethylene glycol (MIRALAX) 17 g  packet Take 17 g by mouth daily. (Patient not taking: Reported on 01/21/2022) 30 each 3   potassium chloride (KLOR-CON) 20 MEQ packet 1 packet (20 meq) 2 times daily for 3 days. Then 1 packet (20 meq) daily for 2 weeks (Patient not taking: Reported on 11/23/2022) 20 packet 0    Review of Systems: A full ROS was attempted today and was  able to be performed.  Systems assessed include - Constitutional, Eyes, HENT, Respiratory, Cardiovascular, Gastrointestinal, Genitourinary, Integument/breast, Hematologic/lymphatic, Musculoskeletal, Neurological, Behavioral/Psych, Endocrine, Allergic/Immunologic - with pertinent responses as per HPI.  Physical Examination: Temp:  [98.9 F (37.2 C)-99.7 F (37.6 C)] 99.6 F (37.6 C) (06/12 1606) Pulse Rate:  [61-135] 61 (06/12 1606) Resp:  [17-20] 17 (06/12 1606) BP: (143-150)/(74-99) 150/75 (06/12 1606) SpO2:  [97 %-100 %] 98 % (06/12 1606)  General - well nourished, well developed, in no apparent distress.    Ophthalmologic - fundi not visualized due to noncooperation.    Cardiovascular - regular rhythm and rate  Mental Status -  Level of arousal awake and alert. She attempts to speak but it is not intelligible.  Mostly says yes to everything.  She cannot give  me her name or name objects. She did not follow commands for me  Cranial Nerves II - XII - Pupils equal round reactive.  Extraocular motions are intact.  Blink to threat bilaterally.  Face appears symmetric.  Motor Strength -he is able to lift all 4 extremities off the bed spontaneously.  She does not follow commands.  Motor Tone & Bulk - Muscle tone was assessed at the neck and appendages and was normal.  Bulk was normal and fasciculations were absent.   Reflexes - The patient's reflexes were normal in all extremities and she had no pathological reflexes.  Sensory -patient intact to light touch and noxious stimuli.  In all 4 extremities  Coordination -unable to test however no obvious ataxia  or tremor.  Gait and Station - deferred  Data Reviewed: DG Swallowing Func-Speech Pathology  Result Date: 11/25/2022 Table formatting from the original result was not included. Modified Barium Swallow Study Patient Details Name: Nakeia Gentile MRN: [id number] Date of Birth: 1935-12-30 Today's Date: 11/25/2022 HPI/PMH: HPI: 87 y.o. female with medical history pancreatic ca, cardiac issues, adm with nonspecific weakness x few weeks, found to be in rapid atrial fibrillation. MRI brain: Scattered subcentimeter acute ischemic nonhemorrhagic infarcts involving the bilateral cerebral and cerebellar hemispheres, watershed in distribution.  Swallow and speech/language evaluation ordered. Clinical Impression: Clinical Impression: Patient presents with severe oral and minimal pharyngeal dysphagia.  Lingual coordination and strength is severely impaired.  These lingual deficits resulting in excessively delayed oral transit with severe lingual pumping often resulting in boluses spilling into pharynx uncontrolled.  Patient required upwards of 40 seconds to transit small boluses of pudding mixed with apple sauce and once bolus was posterior to essentially gradually spilled further and to the pharynx without patient reflexively swallowing. She finally swallowed pudding and poorly masticated cracker with thin liquids.  Various techniques including counting 1-3 swallow, cued swallowing,  dry spoon to the mouth to help trigger swallow etc. were not effective.  Pharyngeal swallow is largely intact once it triggers and patient has very large piriform sinuses that allows pooling of liquids prior to swallowing trigger.  She is at severe risk of malnutrition, dehydration with current level of dysphagia.  SLP advises patient start a full liquid diet (no pudding or grits) with very strict precautions including oral suctioning if she does not swallow and observing laryngeal elevation to assure swallow is triggered.  Patient did not cough  nor clear her throat during this exam reflexively as was clinically observed during bedside swallow evaluation.  Suspect she has at least occasional laryngeal penetration of boluses or/and secretions to vocal folds.  Per family at baseline patient is feeding herself and managing swallowing well.  Based on their statement,  this appears to be a significant decline.   SLP will follow closely for p.o. tolerance, readiness for dietary advancement. Factors that may increase risk of adverse event in presence of aspiration Rubye Oaks & Clearance Coots 2021): Factors that may increase risk of adverse event in presence of aspiration Rubye Oaks & Clearance Coots 2021): Limited mobility; Weak cough; Reduced cognitive function; Dependence for feeding and/or oral hygiene Recommendations/Plan: Swallowing Evaluation Recommendations Swallowing Evaluation Recommendations Recommendations: PO diet PO Diet Recommendation: -- (Full liquid diet no pudding or grits) Liquid Administration via: Straw; Cup Medication Administration: Via alternative means (Suspension, crushed with liquid, or via IV) Supervision: Full supervision/cueing for swallowing strategies Swallowing strategies  : Minimize environmental distractions; Slow rate; Small bites/sips (Provide liquids to aid swallowing with thicker consistencies.  Oral suction if patient  does not swallow) Postural changes: Position pt fully upright for meals; Stay upright 30-60 min after meals (Patient must be fully upright for all p.o.) Oral care recommendations: Oral care BID (2x/day) Recommended consults: Consider Palliative care Caregiver Recommendations: Have oral suction available Treatment Plan Treatment Plan Treatment recommendations: Therapy as outlined in treatment plan below Follow-up recommendations: Follow physicians's recommendations for discharge plan and follow up therapies Functional status assessment: Patient has had a recent decline in their functional status and demonstrates the ability to make  significant improvements in function in a reasonable and predictable amount of time. Treatment frequency: Min 1x/week Treatment duration: 1 week Interventions: Aspiration precaution training; Compensatory techniques; Patient/family education; Trials of upgraded texture/liquids; Diet toleration management by SLP Recommendations Recommendations for follow up therapy are one component of a multi-disciplinary discharge planning process, led by the attending physician.  Recommendations may be updated based on patient status, additional functional criteria and insurance authorization. Assessment: Orofacial Exam: Orofacial Exam Oral Cavity: Oral Hygiene: Pooled secretions Oral Cavity - Dentition: Dentures, top; Dentures, bottom Orofacial Anatomy: WFL Oral Motor/Sensory Function: Unable to test (able to seal lips on a straw, spoon) Anatomy: Anatomy: WFL Boluses Administered: Boluses Administered Boluses Administered: Thin liquids (Level 0); Mildly thick liquids (Level 2, nectar thick); Puree; Solid  Oral Impairment Domain: Oral Impairment Domain Lip Closure: Escape beyond mid-chin Tongue control during bolus hold: Posterior escape of greater than half of bolus; Posterior escape of less than half of bolus Bolus preparation/mastication: Minimal chewing/mashing with majority of bolus unchewed; Disorganized chewing/mashing with solid pieces of bolus unchewed Bolus transport/lingual motion: Repetitive/disorganized tongue motion Oral residue: Trace residue lining oral structures; Residue collection on oral structures Location of oral residue : Tongue; Floor of mouth Initiation of pharyngeal swallow : Pyriform sinuses  Pharyngeal Impairment Domain: Pharyngeal Impairment Domain Soft palate elevation: No bolus between soft palate (SP)/pharyngeal wall (PW) Laryngeal elevation: Complete superior movement of thyroid cartilage with complete approximation of arytenoids to epiglottic petiole Anterior hyoid excursion: Complete anterior  movement Epiglottic movement: Complete inversion Laryngeal vestibule closure: Complete, no air/contrast in laryngeal vestibule Pharyngeal stripping wave : Present - complete Pharyngeal contraction (A/P view only): N/A Pharyngoesophageal segment opening: Complete distension and complete duration, no obstruction of flow Tongue base retraction: No contrast between tongue base and posterior pharyngeal wall (PPW) Pharyngeal residue: Trace residue within or on pharyngeal structures Location of pharyngeal residue: Tongue base; Pyriform sinuses  Esophageal Impairment Domain: Esophageal Impairment Domain Esophageal clearance upright position: Esophageal retention Pill: Esophageal Impairment Domain Esophageal clearance upright position: Esophageal retention Penetration/Aspiration Scale Score: Penetration/Aspiration Scale Score 1.  Material does not enter airway: Mildly thick liquids (Level 2, nectar thick); Puree; Solid 2.  Material enters airway, remains ABOVE vocal cords then ejected out: Thin liquids (Level 0) Compensatory Strategies: Compensatory Strategies Compensatory strategies: Yes Straw: Effective Effective Straw: Thin liquid (Level 0) Liquid wash: Effective Effective Liquid Wash: Puree; Solid Other(comment): Ineffective Ineffective Other(comment): Thin liquid (Level 0); Mildly thick liquid (Level 2, nectar thick); Puree; Solid   General Information: Caregiver present: No  Diet Prior to this Study: NPO   No data recorded  Respiratory Status: WFL   Supplemental O2: None (Room air)   History of Recent Intubation: No  Behavior/Cognition: Alert; Doesn't follow directions; Other (Comment) Self-Feeding Abilities: Dependent for feeding Baseline vocal quality/speech: Hypophonia/low volume Volitional Cough: Unable to elicit Volitional Swallow: Unable to elicit Exam Limitations: No limitations Goal Planning: Prognosis for improved oropharyngeal function: Fair Barriers to Reach Goals: Severity of deficits No data recorded  Patient/Family Stated Goal: for pt to be able to eat Consulted and agree with results and recommendations: Nurse; Pt unable/family or caregiver not available Pain: Pain Assessment Pain Assessment: No/denies pain Breathing: 0 Negative Vocalization: 0 Facial Expression: 0 Body Language: 0 Consolability: 0 PAINAD Score: 0 End of Session: Start Time:SLP Start Time (ACUTE ONLY): 1420 Stop Time: SLP Stop Time (ACUTE ONLY): 1440 Time Calculation:SLP Time Calculation (min) (ACUTE ONLY): 20 min Charges: SLP Evaluations $ SLP Speech Visit: 1 Visit SLP Evaluations $BSS Swallow: 1 Procedure $MBS Swallow: 1 Procedure $Swallowing Treatment: 1 Procedure SLP visit diagnosis: SLP Visit Diagnosis: Dysphagia, oropharyngeal phase (R13.12) Past Medical History: Past Medical History: Diagnosis Date  Acquired diverticulum of distal esophagus with dysphagia 10/03/2018  AKI (acute kidney injury) (HCC) 08/31/2018  Anxiety   Bradycardia 02/16/2014  DDD (degenerative disc disease), lumbar   DJD (degenerative joint disease)   Esophageal stenosis 08/31/2018  Fibromyalgia   Hemorrhoid   Hypertension   Hypothyroidism   Osteopenia   Sigmoid diverticulosis   Thyroid nodule   PT DENIES Past Surgical History: Past Surgical History: Procedure Laterality Date  ABDOMINAL HYSTERECTOMY    COMPLETE  BALLOON DILATION N/A 09/01/2018  Procedure: BALLOON DILATION;  Surgeon: Kerin Salen, MD;  Location: [facility] ENDOSCOPY;  Service: Gastroenterology;  Laterality: N/A;  BIOPSY  09/01/2018  Procedure: BIOPSY;  Surgeon: Kerin Salen, MD;  Location: [facility] ENDOSCOPY;  Service: Gastroenterology;;  BIOPSY  01/26/2022  Procedure: BIOPSY;  Surgeon: Lemar Lofty., MD;  Location: [facility] ENDOSCOPY;  Service: Gastroenterology;;  BOTOX INJECTION  09/01/2018  Procedure: BOTOX INJECTION;  Surgeon: Kerin Salen, MD;  Location: [facility] ENDOSCOPY;  Service: Gastroenterology;;  BREAST SURGERY    breast biopsy  CHOLECYSTECTOMY    ESOPHAGEAL MANOMETRY N/A 10/07/2018  Procedure: ESOPHAGEAL MANOMETRY  (EM);  Surgeon: Kerin Salen, MD;  Location: [facility] ENDOSCOPY;  Service: Gastroenterology;  Laterality: N/A;  ESOPHAGOGASTRODUODENOSCOPY N/A 10/25/2018  Procedure: ESOPHAGOGASTRODUODENOSCOPY (EGD);  Surgeon: Karie Soda, MD;  Location: [facility] ORS;  Service: General;  Laterality: N/A;  ESOPHAGOGASTRODUODENOSCOPY N/A 01/26/2022  Procedure: ESOPHAGOGASTRODUODENOSCOPY (EGD);  Surgeon: Lemar Lofty., MD;  Location: [facility] ENDOSCOPY;  Service: Gastroenterology;  Laterality: N/A;  ESOPHAGOGASTRODUODENOSCOPY (EGD) WITH PROPOFOL N/A 09/01/2018  Procedure: ESOPHAGOGASTRODUODENOSCOPY (EGD) WITH PROPOFOL;  Surgeon: Kerin Salen, MD;  Location: [facility] ENDOSCOPY;  Service: Gastroenterology;  Laterality: N/A;  ESOPHAGOGASTRODUODENOSCOPY (EGD) WITH PROPOFOL N/A 12/30/2021  Procedure: ESOPHAGOGASTRODUODENOSCOPY (EGD) WITH PROPOFOL;  Surgeon: Willis Modena, MD;  Location: [facility] ENDOSCOPY;  Service: Gastroenterology;  Laterality: N/A;  EUS N/A 01/26/2022  Procedure: UPPER ENDOSCOPIC ULTRASOUND (EUS) RADIAL;  Surgeon: Lemar Lofty., MD;  Location: [facility] ENDOSCOPY;  Service: Gastroenterology;  Laterality: N/A;  FIDUCIAL MARKER PLACEMENT N/A 01/26/2022  Procedure: FIDUCIAL MARKER PLACEMENT;  Surgeon: Lemar Lofty., MD;  Location: [facility] ENDOSCOPY;  Service: Gastroenterology;  Laterality: N/A;  FINE NEEDLE ASPIRATION N/A 12/30/2021  Procedure: FINE NEEDLE ASPIRATION (FNA) LINEAR;  Surgeon: Willis Modena, MD;  Location: [facility] ENDOSCOPY;  Service: Gastroenterology;  Laterality: N/A;  FOREIGN BODY REMOVAL  01/26/2022  Procedure: FOREIGN BODY REMOVAL;  Surgeon: Meridee Score Netty Starring., MD;  Location: [facility] ENDOSCOPY;  Service: Gastroenterology;;  PACEMAKER IMPLANT N/A 03/06/2019  Procedure: PACEMAKER IMPLANT;  Surgeon: Marinus Maw, MD;  Location: [facility] INVASIVE CV LAB;  Service: Cardiovascular;  Laterality: N/A;  UPPER ESOPHAGEAL ENDOSCOPIC ULTRASOUND (EUS) Bilateral 12/30/2021  Procedure: UPPER ESOPHAGEAL ENDOSCOPIC ULTRASOUND (EUS);  Surgeon:  Willis Modena, MD;  Location: [facility] ENDOSCOPY;  Service: Gastroenterology;  Laterality: Bilateral; [name], MS [facility] SLP Acute Rehab Services Office [phone] Chales Abrahams 11/25/2022, 3:20  PM  VAS US CAROTID (at [facility] and [facility] only)  Result Date: 11/25/2022 Carotid Arterial Duplex Study Patient Name:  [facility]  Date of Exam:   11/25/2022 Medical Rec #: [id number]      Accession #:    [id number] Date of Birth: 11/28/1935      Patient Gender: F Patient Age:   64 years Exam Location:  [facility] Procedure:      VAS US CAROTID Referring Phys: Marlin Canary --------------------------------------------------------------------------------  Indications:       CVA. Risk Factors:      Hypertension. Other Factors:     Afib, PM. Comparison Study:  No previous exams Performing Technologist: Jody Hill RVT, RDMS  Examination Guidelines: A complete evaluation includes B-mode imaging, spectral Doppler, color Doppler, and power Doppler as needed of all accessible portions of each vessel. Bilateral testing is considered an integral part of a complete examination. Limited examinations for reoccurring indications may be performed as noted.  Right Carotid Findings: +----------+--------+--------+--------+------------------+------------------+           PSV cm/sEDV cm/sStenosisPlaque DescriptionComments           +----------+--------+--------+--------+------------------+------------------+ CCA Prox  41      14                                intimal thickening +----------+--------+--------+--------+------------------+------------------+ CCA Distal36      12              heterogenous      intimal thickening +----------+--------+--------+--------+------------------+------------------+ ICA Prox  38      17                                                   +----------+--------+--------+--------+------------------+------------------+ ICA Distal61      27                                tortuous            +----------+--------+--------+--------+------------------+------------------+ ECA       41      8                                                    +----------+--------+--------+--------+------------------+------------------+ +----------+--------+-------+----------------+-------------------+           PSV cm/sEDV cmsDescribe        Arm Pressure (mmHG) +----------+--------+-------+----------------+-------------------+ [id number]             Multiphasic, WNL                    +----------+--------+-------+----------------+-------------------+ +---------+--------+--+--------+-+---------+ VertebralPSV cm/s19EDV cm/s8Antegrade +---------+--------+--+--------+-+---------+  Left Carotid Findings: +----------+--------+--------+--------+------------------+------------------+           PSV cm/sEDV cm/sStenosisPlaque DescriptionComments           +----------+--------+--------+--------+------------------+------------------+ CCA Prox  42      11                                                   +----------+--------+--------+--------+------------------+------------------+  CCA Distal36      12              focal and calcificintimal thickening +----------+--------+--------+--------+------------------+------------------+ ICA Prox  35      17                                                   +----------+--------+--------+--------+------------------+------------------+ ICA Distal38      16                                                   +----------+--------+--------+--------+------------------+------------------+ ECA       38      7                                                    +----------+--------+--------+--------+------------------+------------------+ +----------+--------+--------+----------------+-------------------+           PSV cm/sEDV cm/sDescribe        Arm Pressure (mmHG)  +----------+--------+--------+----------------+-------------------+ [id number]              Multiphasic, WNL                    +----------+--------+--------+----------------+-------------------+ +---------+--------+--+--------+-+---------+ VertebralPSV cm/s18EDV cm/s6Antegrade +---------+--------+--+--------+-+---------+   Summary: Right Carotid: The extracranial vessels were near-normal with only minimal wall                thickening or plaque. Left Carotid: The extracranial vessels were near-normal with only minimal wall               thickening or plaque. Vertebrals:  Bilateral vertebral arteries demonstrate antegrade flow. Subclavians: Normal flow hemodynamics were seen in bilateral subclavian              arteries. *See table(s) above for measurements and observations.     Preliminary    MR BRAIN WO CONTRAST  Result Date: 11/24/2022 CLINICAL DATA:  Initial evaluation for neuro deficit, stroke. EXAM: MRI HEAD WITHOUT CONTRAST TECHNIQUE: Multiplanar, multiecho pulse sequences of the brain and surrounding structures were obtained without intravenous contrast. COMPARISON:  CT from 11/23/2022. FINDINGS: Brain: Examination degraded by motion artifact. Cerebral volume within normal limits. Mild chronic microvascular ischemic disease for age. Patchy scattered subcentimeter foci of restricted diffusion are seen involving the bilateral cerebral and cerebellar hemispheres. Patchy cortical and subcortical involvement. Overall distribution is watershed in nature. No associated hemorrhage or mass effect. Gray-white matter differentiation maintained. No areas of chronic cortical infarction. No acute or chronic intracranial blood products. No mass lesion, midline shift or mass effect. No hydrocephalus or extra-axial fluid collection. Pituitary gland suprasellar region within normal limits. Vascular: Major intracranial vascular flow voids are maintained. Skull and upper cervical spine: Craniocervical  junction within normal limits. Bone marrow signal intensity within normal limits. No scalp soft tissue abnormality. Sinuses/Orbits: Globes orbital soft tissues within normal limits. Paranasal sinuses are clear. No mastoid effusion. Other: None. IMPRESSION: 1. Scattered subcentimeter acute ischemic nonhemorrhagic infarcts involving the bilateral cerebral and cerebellar hemispheres, watershed in distribution. Overall pattern favors a hypoperfusion injury. 2. Underlying mild chronic microvascular ischemic disease for age. Electronically  Signed   By: Rise Mu M.D.   On: 11/24/2022 18:18   ECHOCARDIOGRAM COMPLETE  Result Date: 11/24/2022    ECHOCARDIOGRAM REPORT   Patient Name:   [facility] Date of Exam: 11/24/2022 Medical Rec #:  [id number]     Height:       66.0 in Accession #:    [id number]    Weight:       121.0 lb Date of Birth:  11-01-1935     BSA:          1.615 m Patient Age:    86 years      BP:           165/86 mmHg Patient Gender: F             HR:           66 bpm. Exam Location:  Inpatient Procedure: 2D Echo, 3D Echo, Cardiac Doppler and Color Doppler Indications:    R07.9* Chest pain, unspecified. Acute AMS.  History:        Patient has prior history of Echocardiogram examinations, most                 recent 12/27/2018. Abnormal ECG and Pacemaker, Mitral Valve                 Prolapse, Mitral Valve Disease and Aortic Valve Disease,                 Arrythmias:Atrial Fibrillation and Bradycardia,                 Signs/Symptoms:Syncope; Risk Factors:Hypertension. Cancer.  Sonographer:    Sheralyn Boatman RDCS Referring Phys: [id number] Jonita Albee IMPRESSIONS  1. Left ventricular ejection fraction, by estimation, is 45 to 50%. The left ventricle has mildly decreased function. The left ventricle has no regional wall motion abnormalities. There is mild concentric left ventricular hypertrophy. Left ventricular diastolic parameters are consistent with Grade II diastolic dysfunction  (pseudonormalization).  2. Right ventricular systolic function is normal. The right ventricular size is normal. There is moderately elevated pulmonary artery systolic pressure. The estimated right ventricular systolic pressure is 52.2 mmHg.  3. Left atrial size was severely dilated.  4. Right atrial size was moderately dilated.  5. The mitral valve is grossly normal. Mild mitral valve regurgitation. No evidence of mitral stenosis. There is mild holosystolic prolapse of the middle scallop of the posterior leaflet of the mitral valve.  6. Tricuspid valve regurgitation is mild to moderate.  7. Focal calcification of the non coronary cusp . The aortic valve is tricuspid. Aortic valve regurgitation is not visualized. Aortic valve sclerosis/calcification is present, without any evidence of aortic stenosis.  8. The inferior vena cava is dilated in size with <50% respiratory variability, suggesting right atrial pressure of 15 mmHg. Comparison(s): Changes from prior study are noted. The left ventricular function is worsened. LVEF now 45-50%. Mild MR. Mild to moderate TR. RVSP 52 mmHG. FINDINGS  Left Ventricle: Left ventricular ejection fraction, by estimation, is 45 to 50%. The left ventricle has mildly decreased function. The left ventricle has no regional wall motion abnormalities. The left ventricular internal cavity size was normal in size. There is mild concentric left ventricular hypertrophy. Abnormal (paradoxical) septal motion, consistent with RV pacemaker. Left ventricular diastolic parameters are consistent with Grade II diastolic dysfunction (pseudonormalization). Right Ventricle: The right ventricular size is normal. No increase in right ventricular wall thickness. Right ventricular systolic function is normal. There is moderately  elevated pulmonary artery systolic pressure. The tricuspid regurgitant velocity is 3.05 m/s, and with an assumed right atrial pressure of 15 mmHg, the estimated right ventricular  systolic pressure is 52.2 mmHg. Left Atrium: Left atrial size was severely dilated. Right Atrium: Right atrial size was moderately dilated. Pericardium: There is no evidence of pericardial effusion. Mitral Valve: The mitral valve is grossly normal. There is mild holosystolic prolapse of the middle scallop of the posterior leaflet of the mitral valve. There is moderate thickening of the mitral valve leaflet(s). Mild mitral valve regurgitation. No evidence of mitral valve stenosis. Tricuspid Valve: The tricuspid valve is grossly normal. Tricuspid valve regurgitation is mild to moderate. No evidence of tricuspid stenosis. Aortic Valve: Focal calcification of the non coronary cusp. The aortic valve is tricuspid. Aortic valve regurgitation is not visualized. Aortic valve sclerosis/calcification is present, without any evidence of aortic stenosis. Pulmonic Valve: The pulmonic valve was grossly normal. Pulmonic valve regurgitation is trivial. No evidence of pulmonic stenosis. Aorta: The aortic root and ascending aorta are structurally normal, with no evidence of dilitation. Venous: The inferior vena cava is dilated in size with less than 50% respiratory variability, suggesting right atrial pressure of 15 mmHg. IAS/Shunts: There is right bowing of the interatrial septum, suggestive of elevated left atrial pressure. The atrial septum is grossly normal. Additional Comments: A device lead is visualized in the right atrium and right ventricle.  LEFT VENTRICLE PLAX 2D LVIDd:         4.00 cm   Diastology LVIDs:         3.30 cm   LV e' medial:    5.11 cm/s LV PW:         1.30 cm   LV E/e' medial:  12.3 LV IVS:        1.20 cm   LV e' lateral:   6.96 cm/s LVOT diam:     2.20 cm   LV E/e' lateral: 9.0 LV SV:         85 LV SV Index:   52 LVOT Area:     3.80 cm                           3D Volume EF:                          3D EF:        50 % RIGHT VENTRICLE             IVC RV S prime:     14.70 cm/s  IVC diam: 1.95 cm TAPSE  (M-mode): 2.4 cm LEFT ATRIUM              Index        RIGHT ATRIUM           Index LA diam:        5.90 cm  3.65 cm/m   RA Area:     18.00 cm LA Vol (A2C):   128.0 ml 79.25 ml/m  RA Volume:   41.40 ml  25.63 ml/m LA Vol (A4C):   86.8 ml  53.74 ml/m LA Biplane Vol: 110.0 ml 68.10 ml/m  AORTIC VALVE             PULMONIC VALVE LVOT Vmax:   118.00 cm/s PR End Diast Vel: 1.91 msec LVOT Vmean:  73.100 cm/s LVOT VTI:    0.223 m  AORTA Ao Root diam:  2.90 cm Ao Asc diam:  3.20 cm MITRAL VALVE                  TRICUSPID VALVE MV Area (PHT): 3.39 cm       TR Peak grad:   37.2 mmHg MV Decel Time: 224 msec       TR Vmax:        305.00 cm/s MR Peak grad:    95.6 mmHg MR Mean grad:    60.0 mmHg    SHUNTS MR Vmax:         489.00 cm/s  Systemic VTI:  0.22 m MR Vmean:        360.0 cm/s   Systemic Diam: 2.20 cm MR PISA:         0.57 cm MR PISA Eff ROA: 4 mm MR PISA Radius:  0.30 cm MV E velocity: 62.60 cm/s MV A velocity: 40.25 cm/s MV E/A ratio:  1.56 Lennie Odor MD Electronically signed by Lennie Odor MD Signature Date/Time: 11/24/2022/12:16:24 PM    Final    CT Head Wo Contrast  Result Date: 11/23/2022 CLINICAL DATA:  Provided history: Mental status change, unknown cause. EXAM: CT HEAD WITHOUT CONTRAST TECHNIQUE: Contiguous axial images were obtained from the base of the skull through the vertex without intravenous contrast. RADIATION DOSE REDUCTION: This exam was performed according to the departmental dose-optimization program which includes automated exposure control, adjustment of the mA and/or kV according to patient size and/or use of iterative reconstruction technique. COMPARISON:  No pertinent prior exams available for comparison. FINDINGS: Brain: Mild cerebral atrophy. There is no acute intracranial hemorrhage. No demarcated cortical infarct. No extra-axial fluid collection. No evidence of an intracranial mass. No midline shift. Vascular: No hyperdense vessel.  Atherosclerotic calcifications. Skull: No  fracture or aggressive osseous lesion. Sinuses/Orbits: No mass or acute finding within the imaged orbits. Tiny mucous retention cyst within the right maxillary sinus. Minimal mucosal thickening within the left maxillary sinus and bilateral ethmoid sinuses. IMPRESSION: 1. No evidence of an acute intracranial abnormality. 2. Mild cerebral atrophy. Electronically Signed   By: Jackey Loge D.O.   On: 11/23/2022 09:54   DG Chest 2 View  Result Date: 11/23/2022 CLINICAL DATA:  Weakness. EXAM: CHEST - 2 VIEW COMPARISON:  03/07/2019. FINDINGS: Left chest dual-chamber pacemaker with leads projecting over the right atrium and ventricle. Low lung volumes accentuate the pulmonary vasculature and cardiomediastinal silhouette. No consolidation or pulmonary edema. Stable cardiomegaly and mediastinal contours with tortuous aorta. No pleural effusion or pneumothorax. IMPRESSION: No evidence of acute cardiopulmonary disease. Electronically Signed   By: Orvan Falconer M.D.   On: 11/23/2022 09:23   CUP PACEART REMOTE DEVICE CHECK  Result Date: 10/30/2022 Scheduled remote reviewed. Normal device function.  Hx of PAF, controlled rates, burden 1.6%, Eliquis per PA report Next remote 91 days. LA, CVRS   Assessment: 87 y.o. female with history of atrial fibrillation on Eliquis at home currently not able to swallow and transition to IV heparin no bolus.  MRI shows multiple scattered bilateral cerebral and cerebellar hemisphere ischemic infarcts appears to be predominantly in the watershed distribution.  Family denies that she had any episodes hypotension however she was in atrial fibrillation and required IV metoprolol in the ER.  Stroke Risk Factors - atrial fibrillation Echo completed shows 45 to 50% EF Carotid doppler neg. Plan: - HgbA1c, fasting lipid panel - PT consult, OT consult, Speech consult -Goal LDL less than 70.  Currently 102 recommend starting a statin unless  patient refuses. -CTA head - Prophylactic  therapy-Anticoagulation with IV heparin until can transition to p.o. Eliquis. - Risk factor modification - Telemetry monitoring - Frequent neuro checks -Avoid hypotension -She is already more than 2 days out no need for permissive hypertension.  - Stroke team to follow  Thank you for this consultation and allowing [place] to participate in the care of this patient.   ADDENDUM:  IMPRESSION: CT HEAD IMPRESSION:   1. Previously identified scattered small volume ischemic infarcts are not well visualized by CT. No other new large vessel territory infarct, intracranial hemorrhage, or other new acute abnormality. 2. Underlying mild chronic microvascular ischemic disease.   CTA HEAD:   1. Focal filling defect at a distal left M2/M3 junction, consistent with subocclusive thrombus. Perfusion is seen distally. 2. Otherwise wide patency of the major arterial vasculature of the head. No other large vessel occlusion. No hemodynamically significant or correctable stenosis. 3. Mild atheromatous change within the carotid siphons without hemodynamically significant stenosis.  Con't anticoagulation as discussed.

## 2022-11-26 DIAGNOSIS — I4891 Unspecified atrial fibrillation: Secondary | ICD-10-CM | POA: Diagnosis not present

## 2022-11-26 LAB — BASIC METABOLIC PANEL
Anion gap: 9 (ref 5–15)
BUN: 10 mg/dL (ref 8–23)
CO2: 21 mmol/L — ABNORMAL LOW (ref 22–32)
Calcium: 8.5 mg/dL — ABNORMAL LOW (ref 8.9–10.3)
Chloride: 101 mmol/L (ref 98–111)
Creatinine, Ser: 0.63 mg/dL (ref 0.44–1.00)
GFR, Estimated: >60 mL/min (ref >60)
Glucose, Bld: 89 mg/dL (ref 70–99)
Potassium: 3.3 mmol/L — ABNORMAL LOW (ref 3.5–5.1)
Sodium: 131 mmol/L — ABNORMAL LOW (ref 135–145)

## 2022-11-26 LAB — CBC
HCT: 28.1 % — ABNORMAL LOW (ref 36.0–46.0)
Hemoglobin: 9.2 g/dL — ABNORMAL LOW (ref 12.0–15.0)
MCH: 23.9 pg — ABNORMAL LOW (ref 26.0–34.0)
MCHC: 32.7 g/dL (ref 30.0–36.0)
MCV: 73 fL — ABNORMAL LOW (ref 80.0–100.0)
Platelets: 146 10*3/uL — ABNORMAL LOW (ref 150–400)
RBC: 3.85 MIL/uL — ABNORMAL LOW (ref 3.87–5.11)
RDW: 15.9 % — ABNORMAL HIGH (ref 11.5–15.5)
WBC: 4.1 10*3/uL (ref 4.0–10.5)
nRBC: 0 % (ref 0.0–0.2)

## 2022-11-26 LAB — APTT
aPTT: 60 seconds — ABNORMAL HIGH (ref 24–36)
aPTT: 83 seconds — ABNORMAL HIGH (ref 24–36)

## 2022-11-26 LAB — MAGNESIUM: Magnesium: 1.9 mg/dL (ref 1.7–2.4)

## 2022-11-26 MED ORDER — METOPROLOL SUCCINATE ER 50 MG PO TB24
50.0000 mg | ORAL_TABLET | Freq: Every day | ORAL | Status: DC
  Administered 2022-11-26 – 2022-12-03 (×8): 50 mg via ORAL
  Filled 2022-11-26 (×8): qty 1

## 2022-11-26 MED ORDER — POTASSIUM CHLORIDE CRYS ER 20 MEQ PO TBCR
40.0000 meq | EXTENDED_RELEASE_TABLET | Freq: Once | ORAL | Status: AC
  Administered 2022-11-26: 40 meq via ORAL
  Filled 2022-11-26: qty 2

## 2022-11-26 NOTE — Progress Notes (Signed)
CCMD called patient had 7 beats V-tach around 0300 and came back to sinus rhythm. RN notified on-call.

## 2022-11-26 NOTE — Progress Notes (Signed)
PROGRESS NOTE    Shannon Ortiz  JYN:[id number] DOB: [birth year] DOA: 11/23/2022 PCP: Daisy Floro, MD    Brief Narrative:  87 y.o. female with medical history significant for sinus node dysfunction with pacemaker, hypertension, atrial fibrillation on Eliquis and pancreatic cancer undergoing surveillance, being admitted to the hospital for a couple of weeks of nonspecific weakness, found to be in rapid atrial fibrillation.  Also patient has been confused which is changed from her baseline.  CT head was unremarkable. In the ED she was given IV metoprolol and was converted back to normal sinus rhythm.  She was found to have significantly elevated troponin, cardiology was consulted. Also found to have CVA on MRI.  Main issue swallowing and speech.   Assessment and Plan: CVA -MRI: Scattered subcentimeter acute ischemic nonhemorrhagic infarcts involving the bilateral cerebral and cerebellar hemispheres, watershed in distribution. Overall pattern favors a hypoperfusion injury. -FLP-- LDL: 102- will need statin once taking PO, HgbA1c: 4.9 -SLP consult- diet ordered and MBS done-- severe swallowing issues -PT/OT consult -neurology consult:    Paroxysmal atrial fibrillation with RVR -resume PO BB -IV heparin per neurology until able to take PO   Hypokalemia -replete  Elevated troponin -No chest pain, likely from demand ischemia from atrial fibrillation with RVR -Cardiology consulted; echocardiogram shows reduced EF ventricular hypokinesis -Cardiology following   Hypertension -see above-- per neuro, no need for permissive HTN  Overall concern for poor prognosis - not able to swallow/speak etc-- history of pancreatic cancer--- will ask pallaitive care to see   DVT prophylaxis: SCDs Start: 11/23/22 1116    Code Status: Full Code Family Communication: at bedside  Disposition Plan:  Level of care: Progressive Status is: Inpatient Remains inpatient appropriate     Consultants:   Cards Neuro Palliative care   Subjective: No overnight events  Objective: Vitals:   11/26/22 0318 11/26/22 0517 11/26/22 0534 11/26/22 1059  BP: (!) 162/80 (!) 158/79 (!) 149/81 (!) 166/114  Pulse: 64 66 63 67  Resp:      Temp:   99.1 F (37.3 C)   TempSrc:   Oral   SpO2:   99%   Weight:      Height:        Intake/Output Summary (Last 24 hours) at 11/26/2022 1157 Last data filed at 11/26/2022 0700 Gross per 24 hour  Intake 659.13 ml  Output 1050 ml  Net -390.87 ml   Filed Weights   11/23/22 0809  Weight: 54.9 kg    Examination:   General: Appearance:    Thin female in no acute distress     Lungs:      respirations unlabored  Heart:    Normal heart rate.    MS:   All extremities are intact.    Neurologic:   Awake, alert- does not speak -- will smile but not following commands       Data Reviewed: I have personally reviewed following labs and imaging studies  CBC: Recent Labs  Lab 11/23/22 0839 11/24/22 0424 11/26/22 0443  WBC 13.6* 8.6 4.1  HGB 10.8* 9.0* 9.2*  HCT 33.1* 28.0* 28.1*  MCV 72.6* 75.7* 73.0*  PLT 131* 102* 146*   Basic Metabolic Panel: Recent Labs  Lab 11/23/22 0839 11/24/22 0424 11/26/22 0441 11/26/22 0443  NA 133* 132*  --  131*  K 3.3* 3.7  --  3.3*  CL 98 102  --  101  CO2 24 23  --  21*  GLUCOSE 136* 97  --  89  BUN 20 17  --  10  CREATININE 1.00 0.76  --  0.63  CALCIUM 9.6 8.8*  --  8.5*  MG  --   --  1.9  --    GFR: Estimated Creatinine Clearance: 45.4 mL/min (by C-G formula based on SCr of 0.63 mg/dL). Liver Function Tests: Recent Labs  Lab 11/23/22 0839  AST 48*  ALT 21  ALKPHOS 167*  BILITOT 1.0  PROT 7.9  ALBUMIN 3.6   No results for input(s): "LIPASE", "AMYLASE" in the last 168 hours. No results for input(s): "AMMONIA" in the last 168 hours. Coagulation Profile: No results for input(s): "INR", "PROTIME" in the last 168 hours. Cardiac Enzymes: No results for input(s): "CKTOTAL", "CKMB",  "CKMBINDEX", "TROPONINI" in the last 168 hours. BNP (last 3 results) No results for input(s): "PROBNP" in the last 8760 hours. HbA1C: Recent Labs    11/25/22 0827  HGBA1C 4.9   CBG: Recent Labs  Lab 11/23/22 0830  GLUCAP 127*   Lipid Profile: Recent Labs    11/25/22 0827  CHOL 194  HDL 76  LDLCALC 102*  TRIG 80  CHOLHDL 2.6   Thyroid Function Tests: No results for input(s): "TSH", "T4TOTAL", "FREET4", "T3FREE", "THYROIDAB" in the last 72 hours. Anemia Panel: No results for input(s): "VITAMINB12", "FOLATE", "FERRITIN", "TIBC", "IRON", "RETICCTPCT" in the last 72 hours. Sepsis Labs: No results for input(s): "PROCALCITON", "LATICACIDVEN" in the last 168 hours.  Recent Results (from the past 240 hour(s))  SARS Coronavirus 2 by RT PCR (hospital order, performed in [facility] hospital lab) *cepheid single result test* Anterior Nasal Swab     Status: None   Collection Time: 11/23/22  9:04 AM   Specimen: Anterior Nasal Swab  Result Value Ref Range Status   SARS Coronavirus 2 by RT PCR NEGATIVE NEGATIVE Final    Comment: (NOTE) SARS-CoV-2 target nucleic acids are NOT DETECTED.  The SARS-CoV-2 RNA is generally detectable in upper and lower respiratory specimens during the acute phase of infection. The lowest concentration of SARS-CoV-2 viral copies this assay can detect is 250 copies / mL. A negative result does not preclude SARS-CoV-2 infection and should not be used as the sole basis for treatment or other patient management decisions.  A negative result may occur with improper specimen collection / handling, submission of specimen other than nasopharyngeal swab, presence of viral mutation(s) within the areas targeted by this assay, and inadequate number of viral copies (<250 copies / mL). A negative result must be combined with clinical observations, patient history, and epidemiological information.  Fact Sheet for Patients:    RoadLapTop.co.za  Fact Sheet for Healthcare Providers: http://kim-miller.com/  This test is not yet approved or  cleared by the [place] FDA and has been authorized for detection and/or diagnosis of SARS-CoV-2 by FDA under an Emergency Use Authorization (EUA).  This EUA will remain in effect (meaning this test can be used) for the duration of the COVID-19 declaration under Section 564(b)(1) of the Act, 21 U.S.C. section 360bbb-3(b)(1), unless the authorization is terminated or revoked sooner.  Performed at [facility], [address] [place], [place] [address]          Radiology Studies: VAS US CAROTID (at [facility] and [facility] only)  Result Date: 11/26/2022 Carotid Arterial Duplex Study Patient Name:  [place]  Date of Exam:   11/25/2022 Medical Rec #: [id number]      Accession #:    [id number] Date of Birth: [birth year]  Patient Gender: F Patient Age:   21 years Exam Location:  [facility] Procedure:      VAS US CAROTID Referring Phys: Marlin Canary --------------------------------------------------------------------------------  Indications:       CVA. Risk Factors:      Hypertension. Other Factors:     Afib, PM. Comparison Study:  No previous exams Performing Technologist: Jody Hill RVT, RDMS  Examination Guidelines: A complete evaluation includes B-mode imaging, spectral Doppler, color Doppler, and power Doppler as needed of all accessible portions of each vessel. Bilateral testing is considered an integral part of a complete examination. Limited examinations for reoccurring indications may be performed as noted.  Right Carotid Findings: +----------+--------+--------+--------+------------------+------------------+           PSV cm/sEDV cm/sStenosisPlaque DescriptionComments           +----------+--------+--------+--------+------------------+------------------+ CCA Prox  41      14                                 intimal thickening +----------+--------+--------+--------+------------------+------------------+ CCA Distal36      12              heterogenous      intimal thickening +----------+--------+--------+--------+------------------+------------------+ ICA Prox  38      17                                                   +----------+--------+--------+--------+------------------+------------------+ ICA Distal61      27                                tortuous           +----------+--------+--------+--------+------------------+------------------+ ECA       41      8                                                    +----------+--------+--------+--------+------------------+------------------+ +----------+--------+-------+----------------+-------------------+           PSV cm/sEDV cmsDescribe        Arm Pressure (mmHG) +----------+--------+-------+----------------+-------------------+ [id number]             Multiphasic, WNL                    +----------+--------+-------+----------------+-------------------+ +---------+--------+--+--------+-+---------+ VertebralPSV cm/s19EDV cm/s8Antegrade +---------+--------+--+--------+-+---------+  Left Carotid Findings: +----------+--------+--------+--------+------------------+------------------+           PSV cm/sEDV cm/sStenosisPlaque DescriptionComments           +----------+--------+--------+--------+------------------+------------------+ CCA Prox  42      11                                                   +----------+--------+--------+--------+------------------+------------------+ CCA Distal36      12              focal and calcificintimal thickening +----------+--------+--------+--------+------------------+------------------+ ICA Prox  35      17                                                   +----------+--------+--------+--------+------------------+------------------+  ICA Distal38       16                                                   +----------+--------+--------+--------+------------------+------------------+ ECA       38      7                                                    +----------+--------+--------+--------+------------------+------------------+ +----------+--------+--------+----------------+-------------------+           PSV cm/sEDV cm/sDescribe        Arm Pressure (mmHG) +----------+--------+--------+----------------+-------------------+ [id number]              Multiphasic, WNL                    +----------+--------+--------+----------------+-------------------+ +---------+--------+--+--------+-+---------+ VertebralPSV cm/s18EDV cm/s6Antegrade +---------+--------+--+--------+-+---------+   Summary: Right Carotid: The extracranial vessels were near-normal with only minimal wall                thickening or plaque. Left Carotid: The extracranial vessels were near-normal with only minimal wall               thickening or plaque. Vertebrals:  Bilateral vertebral arteries demonstrate antegrade flow. Subclavians: Normal flow hemodynamics were seen in bilateral subclavian              arteries. *See table(s) above for measurements and observations.  Electronically signed by Delia Heady MD on 11/26/2022 at 8:01:19 AM.    Final    CT ANGIO HEAD W OR WO CONTRAST  Result Date: 11/25/2022 CLINICAL DATA:  Follow-up examination for stroke. EXAM: CT ANGIOGRAPHY HEAD TECHNIQUE: Multidetector CT imaging of the head was performed using the standard protocol during bolus administration of intravenous contrast. Multiplanar CT image reconstructions and MIPs were obtained to evaluate the vascular anatomy. RADIATION DOSE REDUCTION: This exam was performed according to the departmental dose-optimization program which includes automated exposure control, adjustment of the mA and/or kV according to patient size and/or use of iterative reconstruction technique.  CONTRAST:  75mL OMNIPAQUE IOHEXOL 350 MG/ML SOLN COMPARISON:  Comparison made with prior MRI from 11/24/2022. FINDINGS: CT HEAD Brain: Cerebral volume within normal limits. Mild chronic microvascular ischemic disease again noted. Previously identified scattered small volume ischemic infarcts are not well visualized by CT. No other new large vessel territory infarct. No intracranial hemorrhage. No mass lesion, mass effect or midline shift. No hydrocephalus or extra-axial fluid collection. Vascular: No abnormal hyperdense vessel. Scattered vascular calcifications noted within the carotid siphons. Skull: Scalp soft tissues and calvarium demonstrate no acute finding. Sinuses: Globes and orbital soft tissues within normal limits. Paranasal sinuses and mastoid air cells are clear. Other: None. CTA HEAD Anterior circulation: Visualized distal cervical segments of the internal carotid arteries are widely patent bilaterally. Mild atheromatous change within the carotid siphons without hemodynamically significant stenosis. A1 segments patent bilaterally. Normal anterior communicating complex. Both anterior cerebral arteries are widely patent without stenosis. No M1 stenosis or occlusion. Distal right MCA branches are well perfused. On the left, there is a focal filling defect at a distal left M2/M3 junction (series 10, image 62), consistent with thrombus. Perfusion is seen distally. Posterior circulation: Both V4 segments patent without  stenosis. Right vertebral artery dominant. Both PICA patent. Basilar patent without stenosis. Left SCA patent. Right SCA not well seen, but appears grossly patent as well. Both PCAs supplied primarily via the basilar. PCAs are patent distal aspects without stenosis or occlusion. Venous sinuses: Grossly patent allowing for timing the contrast bolus. Anatomic variants: None significant.  No aneurysm. Review of the MIP images confirms the above findings. IMPRESSION: CT HEAD IMPRESSION: 1.  Previously identified scattered small volume ischemic infarcts are not well visualized by CT. No other new large vessel territory infarct, intracranial hemorrhage, or other new acute abnormality. 2. Underlying mild chronic microvascular ischemic disease. CTA HEAD: 1. Focal filling defect at a distal left M2/M3 junction, consistent with subocclusive thrombus. Perfusion is seen distally. 2. Otherwise wide patency of the major arterial vasculature of the head. No other large vessel occlusion. No hemodynamically significant or correctable stenosis. 3. Mild atheromatous change within the carotid siphons without hemodynamically significant stenosis. Electronically Signed   By: Rise Mu M.D.   On: 11/25/2022 22:25   DG Swallowing Func-Speech Pathology  Result Date: 11/25/2022 Table formatting from the original result was not included. Modified Barium Swallow Study Patient Details Name: Shannon Ortiz MRN: [id number] Date of Birth: 1935/10/29 Today's Date: 11/25/2022 HPI/PMH: HPI: 87 y.o. female with medical history pancreatic ca, cardiac issues, adm with nonspecific weakness x few weeks, found to be in rapid atrial fibrillation. MRI brain: Scattered subcentimeter acute ischemic nonhemorrhagic infarcts involving the bilateral cerebral and cerebellar hemispheres, watershed in distribution.  Swallow and speech/language evaluation ordered. Clinical Impression: Clinical Impression: Patient presents with severe oral and minimal pharyngeal dysphagia.  Lingual coordination and strength is severely impaired.  These lingual deficits resulting in excessively delayed oral transit with severe lingual pumping often resulting in boluses spilling into pharynx uncontrolled.  Patient required upwards of 40 seconds to transit small boluses of pudding mixed with apple sauce and once bolus was posterior to essentially gradually spilled further and to the pharynx without patient reflexively swallowing. She finally swallowed pudding  and poorly masticated cracker with thin liquids.  Various techniques including counting 1-3 swallow, cued swallowing,  dry spoon to the mouth to help trigger swallow etc. were not effective.  Pharyngeal swallow is largely intact once it triggers and patient has very large piriform sinuses that allows pooling of liquids prior to swallowing trigger.  She is at severe risk of malnutrition, dehydration with current level of dysphagia.  SLP advises patient start a full liquid diet (no pudding or grits) with very strict precautions including oral suctioning if she does not swallow and observing laryngeal elevation to assure swallow is triggered.  Patient did not cough nor clear her throat during this exam reflexively as was clinically observed during bedside swallow evaluation.  Suspect she has at least occasional laryngeal penetration of boluses or/and secretions to vocal folds.  Per family at baseline patient is feeding herself and managing swallowing well.  Based on their statement,  this appears to be a significant decline.   SLP will follow closely for p.o. tolerance, readiness for dietary advancement. Factors that may increase risk of adverse event in presence of aspiration Rubye Oaks & Clearance Coots 2021): Factors that may increase risk of adverse event in presence of aspiration Rubye Oaks & Clearance Coots 2021): Limited mobility; Weak cough; Reduced cognitive function; Dependence for feeding and/or oral hygiene Recommendations/Plan: Swallowing Evaluation Recommendations Swallowing Evaluation Recommendations Recommendations: PO diet PO Diet Recommendation: -- (Full liquid diet no pudding or grits) Liquid Administration via: Straw; Cup Medication Administration:  Via alternative means (Suspension, crushed with liquid, or via IV) Supervision: Full supervision/cueing for swallowing strategies Swallowing strategies  : Minimize environmental distractions; Slow rate; Small bites/sips (Provide liquids to aid swallowing with thicker  consistencies.  Oral suction if patient does not swallow) Postural changes: Position pt fully upright for meals; Stay upright 30-60 min after meals (Patient must be fully upright for all p.o.) Oral care recommendations: Oral care BID (2x/day) Recommended consults: Consider Palliative care Caregiver Recommendations: Have oral suction available Treatment Plan Treatment Plan Treatment recommendations: Therapy as outlined in treatment plan below Follow-up recommendations: Follow physicians's recommendations for discharge plan and follow up therapies Functional status assessment: Patient has had a recent decline in their functional status and demonstrates the ability to make significant improvements in function in a reasonable and predictable amount of time. Treatment frequency: Min 1x/week Treatment duration: 1 week Interventions: Aspiration precaution training; Compensatory techniques; Patient/family education; Trials of upgraded texture/liquids; Diet toleration management by SLP Recommendations Recommendations for follow up therapy are one component of a multi-disciplinary discharge planning process, led by the attending physician.  Recommendations may be updated based on patient status, additional functional criteria and insurance authorization. Assessment: Orofacial Exam: Orofacial Exam Oral Cavity: Oral Hygiene: Pooled secretions Oral Cavity - Dentition: Dentures, top; Dentures, bottom Orofacial Anatomy: WFL Oral Motor/Sensory Function: Unable to test (able to seal lips on a straw, spoon) Anatomy: Anatomy: WFL Boluses Administered: Boluses Administered Boluses Administered: Thin liquids (Level 0); Mildly thick liquids (Level 2, nectar thick); Puree; Solid  Oral Impairment Domain: Oral Impairment Domain Lip Closure: Escape beyond mid-chin Tongue control during bolus hold: Posterior escape of greater than half of bolus; Posterior escape of less than half of bolus Bolus preparation/mastication: Minimal chewing/mashing  with majority of bolus unchewed; Disorganized chewing/mashing with solid pieces of bolus unchewed Bolus transport/lingual motion: Repetitive/disorganized tongue motion Oral residue: Trace residue lining oral structures; Residue collection on oral structures Location of oral residue : Tongue; Floor of mouth Initiation of pharyngeal swallow : Pyriform sinuses  Pharyngeal Impairment Domain: Pharyngeal Impairment Domain Soft palate elevation: No bolus between soft palate (SP)/pharyngeal wall (PW) Laryngeal elevation: Complete superior movement of thyroid cartilage with complete approximation of arytenoids to epiglottic petiole Anterior hyoid excursion: Complete anterior movement Epiglottic movement: Complete inversion Laryngeal vestibule closure: Complete, no air/contrast in laryngeal vestibule Pharyngeal stripping wave : Present - complete Pharyngeal contraction (A/P view only): N/A Pharyngoesophageal segment opening: Complete distension and complete duration, no obstruction of flow Tongue base retraction: No contrast between tongue base and posterior pharyngeal wall (PPW) Pharyngeal residue: Trace residue within or on pharyngeal structures Location of pharyngeal residue: Tongue base; Pyriform sinuses  Esophageal Impairment Domain: Esophageal Impairment Domain Esophageal clearance upright position: Esophageal retention Pill: Esophageal Impairment Domain Esophageal clearance upright position: Esophageal retention Penetration/Aspiration Scale Score: Penetration/Aspiration Scale Score 1.  Material does not enter airway: Mildly thick liquids (Level 2, nectar thick); Puree; Solid 2.  Material enters airway, remains ABOVE vocal cords then ejected out: Thin liquids (Level 0) Compensatory Strategies: Compensatory Strategies Compensatory strategies: Yes Straw: Effective Effective Straw: Thin liquid (Level 0) Liquid wash: Effective Effective Liquid Wash: Puree; Solid Other(comment): Ineffective Ineffective Other(comment): Thin  liquid (Level 0); Mildly thick liquid (Level 2, nectar thick); Puree; Solid   General Information: Caregiver present: No  Diet Prior to this Study: NPO   No data recorded  Respiratory Status: WFL   Supplemental O2: None (Room air)   History of Recent Intubation: No  Behavior/Cognition: Alert; Doesn't follow directions; Other (Comment) Self-Feeding Abilities:  Dependent for feeding Baseline vocal quality/speech: Hypophonia/low volume Volitional Cough: Unable to elicit Volitional Swallow: Unable to elicit Exam Limitations: No limitations Goal Planning: Prognosis for improved oropharyngeal function: Fair Barriers to Reach Goals: Severity of deficits No data recorded Patient/Family Stated Goal: for pt to be able to eat Consulted and agree with results and recommendations: Nurse; Pt unable/family or caregiver not available Pain: Pain Assessment Pain Assessment: No/denies pain Breathing: 0 Negative Vocalization: 0 Facial Expression: 0 Body Language: 0 Consolability: 0 PAINAD Score: 0 End of Session: Start Time:SLP Start Time (ACUTE ONLY): 1420 Stop Time: SLP Stop Time (ACUTE ONLY): 1440 Time Calculation:SLP Time Calculation (min) (ACUTE ONLY): 20 min Charges: SLP Evaluations $ SLP Speech Visit: 1 Visit SLP Evaluations $BSS Swallow: 1 Procedure $MBS Swallow: 1 Procedure $Swallowing Treatment: 1 Procedure SLP visit diagnosis: SLP Visit Diagnosis: Dysphagia, oropharyngeal phase (R13.12) Past Medical History: Past Medical History: Diagnosis Date  Acquired diverticulum of distal esophagus with dysphagia 10/03/2018  AKI (acute kidney injury) (HCC) 08/31/2018  Anxiety   Bradycardia 02/16/2014  DDD (degenerative disc disease), lumbar   DJD (degenerative joint disease)   Esophageal stenosis 08/31/2018  Fibromyalgia   Hemorrhoid   Hypertension   Hypothyroidism   Osteopenia   Sigmoid diverticulosis   Thyroid nodule   PT DENIES Past Surgical History: Past Surgical History: Procedure Laterality Date  ABDOMINAL HYSTERECTOMY    COMPLETE   BALLOON DILATION N/A 09/01/2018  Procedure: BALLOON DILATION;  Surgeon: Kerin Salen, MD;  Location: [facility] ENDOSCOPY;  Service: Gastroenterology;  Laterality: N/A;  BIOPSY  09/01/2018  Procedure: BIOPSY;  Surgeon: Kerin Salen, MD;  Location: [facility] ENDOSCOPY;  Service: Gastroenterology;;  BIOPSY  01/26/2022  Procedure: BIOPSY;  Surgeon: Lemar Lofty., MD;  Location: [facility] ENDOSCOPY;  Service: Gastroenterology;;  BOTOX INJECTION  09/01/2018  Procedure: BOTOX INJECTION;  Surgeon: Kerin Salen, MD;  Location: [facility] ENDOSCOPY;  Service: Gastroenterology;;  BREAST SURGERY    breast biopsy  CHOLECYSTECTOMY    ESOPHAGEAL MANOMETRY N/A 10/07/2018  Procedure: ESOPHAGEAL MANOMETRY (EM);  Surgeon: Kerin Salen, MD;  Location: [facility] ENDOSCOPY;  Service: Gastroenterology;  Laterality: N/A;  ESOPHAGOGASTRODUODENOSCOPY N/A 10/25/2018  Procedure: ESOPHAGOGASTRODUODENOSCOPY (EGD);  Surgeon: Karie Soda, MD;  Location: [facility];  Service: General;  Laterality: N/A;  ESOPHAGOGASTRODUODENOSCOPY N/A 01/26/2022  Procedure: ESOPHAGOGASTRODUODENOSCOPY (EGD);  Surgeon: Lemar Lofty., MD;  Location: [facility];  Service: Gastroenterology;  Laterality: N/A;  ESOPHAGOGASTRODUODENOSCOPY (EGD) WITH PROPOFOL N/A 09/01/2018  Procedure: ESOPHAGOGASTRODUODENOSCOPY (EGD) WITH PROPOFOL;  Surgeon: Kerin Salen, MD;  Location: [facility] ENDOSCOPY;  Service: Gastroenterology;  Laterality: N/A;  ESOPHAGOGASTRODUODENOSCOPY (EGD) WITH PROPOFOL N/A 12/30/2021  Procedure: ESOPHAGOGASTRODUODENOSCOPY (EGD) WITH PROPOFOL;  Surgeon: Willis Modena, MD;  Location: [facility];  Service: Gastroenterology;  Laterality: N/A;  EUS N/A 01/26/2022  Procedure: UPPER ENDOSCOPIC ULTRASOUND (EUS) RADIAL;  Surgeon: Lemar Lofty., MD;  Location: [facility];  Service: Gastroenterology;  Laterality: N/A;  FIDUCIAL MARKER PLACEMENT N/A 01/26/2022  Procedure: FIDUCIAL MARKER PLACEMENT;  Surgeon: Lemar Lofty., MD;  Location: [facility];  Service: Gastroenterology;   Laterality: N/A;  FINE NEEDLE ASPIRATION N/A 12/30/2021  Procedure: FINE NEEDLE ASPIRATION (FNA) LINEAR;  Surgeon: Willis Modena, MD;  Location: [facility];  Service: Gastroenterology;  Laterality: N/A;  FOREIGN BODY REMOVAL  01/26/2022  Procedure: FOREIGN BODY REMOVAL;  Surgeon: Meridee Score Netty Starring., MD;  Location: [facility];  Service: Gastroenterology;;  PACEMAKER IMPLANT N/A 03/06/2019  Procedure: PACEMAKER IMPLANT;  Surgeon: Marinus Maw, MD;  Location: [facility] INVASIVE CV LAB;  Service: Cardiovascular;  Laterality: N/A;  UPPER ESOPHAGEAL ENDOSCOPIC ULTRASOUND (  EUS) Bilateral 12/30/2021  Procedure: UPPER ESOPHAGEAL ENDOSCOPIC ULTRASOUND (EUS);  Surgeon: Willis Modena, MD;  Location: [facility];  Service: Gastroenterology;  Laterality: Bilateral; [name], MS [facility] SLP Acute Rehab Services Office [phone] Chales Abrahams 11/25/2022, 3:20 PM  MR BRAIN WO CONTRAST  Result Date: 11/24/2022 CLINICAL DATA:  Initial evaluation for neuro deficit, stroke. EXAM: MRI HEAD WITHOUT CONTRAST TECHNIQUE: Multiplanar, multiecho pulse sequences of the brain and surrounding structures were obtained without intravenous contrast. COMPARISON:  CT from 11/23/2022. FINDINGS: Brain: Examination degraded by motion artifact. Cerebral volume within normal limits. Mild chronic microvascular ischemic disease for age. Patchy scattered subcentimeter foci of restricted diffusion are seen involving the bilateral cerebral and cerebellar hemispheres. Patchy cortical and subcortical involvement. Overall distribution is watershed in nature. No associated hemorrhage or mass effect. Gray-white matter differentiation maintained. No areas of chronic cortical infarction. No acute or chronic intracranial blood products. No mass lesion, midline shift or mass effect. No hydrocephalus or extra-axial fluid collection. Pituitary gland suprasellar region within normal limits. Vascular: Major intracranial vascular flow voids are maintained. Skull  and upper cervical spine: Craniocervical junction within normal limits. Bone marrow signal intensity within normal limits. No scalp soft tissue abnormality. Sinuses/Orbits: Globes orbital soft tissues within normal limits. Paranasal sinuses are clear. No mastoid effusion. Other: None. IMPRESSION: 1. Scattered subcentimeter acute ischemic nonhemorrhagic infarcts involving the bilateral cerebral and cerebellar hemispheres, watershed in distribution. Overall pattern favors a hypoperfusion injury. 2. Underlying mild chronic microvascular ischemic disease for age. Electronically Signed   By: Rise Mu M.D.   On: 11/24/2022 18:18        Scheduled Meds:   stroke: early stages of recovery book   Does not apply Once   atorvastatin  20 mg Oral Daily   docusate sodium  100 mg Oral BID   feeding supplement  237 mL Oral BID BM   metoprolol succinate  50 mg Oral Daily   Continuous Infusions:  amiodarone Stopped (11/25/22 2156)   heparin 900 Units/hr (11/26/22 0517)     LOS: 2 days    Time spent: 45 minutes spent on chart review, discussion with nursing staff, consultants, updating family and interview/physical exam; more than 50% of that time was spent in counseling and/or coordination of care.    Joseph Art, DO [facility] Available via Epic secure chat 7am-7pm After these hours, please refer to coverage provider listed on [email or web address] 11/26/2022, 11:57 AM

## 2022-11-26 NOTE — Progress Notes (Signed)
ANTICOAGULATION CONSULT NOTE  Pharmacy Consult for heparin  Indication: atrial fibrillation - bridge therapy while apixaban on hold  Allergies  Allergen Reactions   Ace Inhibitors Cough   Elavil [Amitriptyline Hcl] Hives   Ketek [Telithromycin] Hives    Patient Measurements: Height: 5\' 6"  (167.6 cm) Weight: 54.9 kg (121 lb) IBW/kg (Calculated) : 59.3 Heparin Dosing Weight: 54.9 kg  Vital Signs: Temp: 98.6 F (37 C) (06/13 0315) Temp Source: Oral (06/13 0315) BP: 162/80 (06/13 0318) Pulse Rate: 64 (06/13 0318)  Labs: Recent Labs    11/23/22 0839 11/23/22 1140 11/23/22 1309 11/24/22 0424 11/24/22 1046 11/24/22 1407 11/25/22 1032 11/25/22 1833 11/26/22 0443  HGB 10.8*  --   --  9.0*  --   --   --   --   --   HCT 33.1*  --   --  28.0*  --   --   --   --   --   PLT 131*  --   --  102*  --   --   --   --   --   APTT  --   --   --   --   --   --  35 58* 60*  HEPARINUNFRC  --   --   --   --   --   --  0.80*  --   --   CREATININE 1.00  --   --  0.76  --   --   --   --   --   TROPONINIHS 158*   < > 330*  --  454* 442*  --   --   --    < > = values in this interval not displayed.     Estimated Creatinine Clearance: 45.4 mL/min (by C-G formula based on SCr of 0.76 mg/dL).   Medications:  Medications Prior to Admission  Medication Sig Dispense Refill Last Dose   amLODipine (NORVASC) 10 MG tablet Take 1 tablet (10 mg total) by mouth daily. Please call our office to schedule an appt for future refills. Thank you 2nd attempt. 15 tablet 0 11/22/2022   apixaban (ELIQUIS) 2.5 MG TABS tablet Take 1 tablet (2.5 mg total) by mouth 2 (two) times daily. 180 tablet 1 11/22/2022 at 1700   losartan (COZAAR) 100 MG tablet Take 100 mg by mouth in the morning.   11/22/2022   metoprolol succinate (TOPROL-XL) 50 MG 24 hr tablet Take 50 mg by mouth in the morning.   11/22/2022 at 0900   pantoprazole (PROTONIX) 20 MG tablet Take 20 mg by mouth daily.   11/22/2022   potassium chloride SA (KLOR-CON M)  20 MEQ tablet Take 1 tablet (20 mEq total) by mouth daily. 30 tablet 2 11/22/2022   docusate sodium (COLACE) 100 MG capsule Take 100 mg by mouth 2 (two) times daily. (Patient not taking: Reported on 11/23/2022)   Not Taking   feeding supplement (ENSURE SURGERY) LIQD Take 237 mLs by mouth 2 (two) times daily between meals. (Patient taking differently: Take 237 mLs by mouth 3 (three) times a week.) 60 Bottle 0    ondansetron (ZOFRAN-ODT) 4 MG disintegrating tablet Take 4 mg by mouth every 8 (eight) hours as needed for nausea. (Patient not taking: Reported on 11/23/2022)   Completed Course   pantoprazole (PROTONIX) 40 MG tablet Take 1 tablet (40 mg total) by mouth daily. (Patient not taking: Reported on 11/23/2022) 30 tablet 6 Not Taking   polyethylene glycol (MIRALAX) 17 g packet Take  17 g by mouth daily. (Patient not taking: Reported on 01/21/2022) 30 each 3 Not Taking   potassium chloride (KLOR-CON) 20 MEQ packet 1 packet (20 meq) 2 times daily for 3 days. Then 1 packet (20 meq) daily for 2 weeks (Patient not taking: Reported on 11/23/2022) 20 packet 0 Not Taking    Assessment: 87 yo F on apixaban 2.5 mg po bid PTA for PAF.  Pharmacy consulted to dose heparin for bridge therapy while pt NPO for new watershed stroke on MRI 11/24/22.  Last dose of apixaban 2.5 mg 6/11 at 0841 am  Today, 11/26/2022: aPTT 60, slightly sub-therapeutic on 800 units/hr No bleeding noted  Goal of Therapy:  Heparin level 0.3 - 0.5 units/ml aPTT 66-85 seconds Monitor platelets by anticoagulation protocol: Yes   Plan:  No bolus per MD orders; aim for lower stroke goal of aPTT 66-86 seconds & heparin level 0.3 - 0.5 units/mL Increase heparin rate to 900 units/hr Recheck 8-hr aPTT Daily CBC & heparin level; aPTTs as needed until DOAC anti-Xa effects have worn off   Arley Phenix RPh 11/26/2022, 5:14 AM

## 2022-11-26 NOTE — Evaluation (Signed)
Speech Language Pathology Evaluation Patient Details Name: Shannon Ortiz MRN: [id number] DOB: 1936/03/10 Today's Date: 11/26/2022 Time: 4098-1191 SLP Time Calculation (min) (ACUTE ONLY): 30 min  Problem List:  Patient Active Problem List   Diagnosis Date Noted   AMS (altered mental status) 11/24/2022   Atrial fibrillation with rapid ventricular response (HCC) 11/23/2022   Genetic testing 02/09/2022   Monoallelic mutation of LZTR1 gene 02/09/2022   Pancreatic cancer (HCC) 01/09/2022   Aortic atherosclerosis (HCC) 12/26/2021   Generalized abdominal pain 05/11/2021   Paroxysmal atrial fibrillation (HCC) 06/22/2019   Pacemaker 06/22/2019   Sinus node dysfunction (HCC) 03/06/2019   Weakness generalized 12/27/2018   Elevated troponin 12/26/2018   Hypomagnesemia 10/28/2018   Hiatal hernia s/p robotic repair 10/25/2018 10/25/2018   Status post robotic [place] (posterior partial) fundoplication 10/25/2018 10/25/2018   Esophageal stricture 10/15/2018   Acquired diverticulum of distal esophagus s/p robotic excision & myotomy 10/25/2018 10/03/2018   Esophageal stenosis 08/31/2018   Hypokalemia 08/31/2018   AKI (acute kidney injury) (HCC) 08/31/2018   Bradycardia 02/16/2014   Near syncope 02/16/2014   HTN (hypertension) 02/16/2014   Past Medical History:  Past Medical History:  Diagnosis Date   Acquired diverticulum of distal esophagus with dysphagia 10/03/2018   AKI (acute kidney injury) (HCC) 08/31/2018   Anxiety    Bradycardia 02/16/2014   DDD (degenerative disc disease), lumbar    DJD (degenerative joint disease)    Esophageal stenosis 08/31/2018   Fibromyalgia    Hemorrhoid    Hypertension    Hypothyroidism    Osteopenia    Sigmoid diverticulosis    Thyroid nodule    PT DENIES   Past Surgical History:  Past Surgical History:  Procedure Laterality Date   ABDOMINAL HYSTERECTOMY     COMPLETE   BALLOON DILATION N/A 09/01/2018   Procedure: BALLOON DILATION;  Surgeon: Kerin Salen, MD;  Location: [facility] ENDOSCOPY;  Service: Gastroenterology;  Laterality: N/A;   BIOPSY  09/01/2018   Procedure: BIOPSY;  Surgeon: Kerin Salen, MD;  Location: [facility] ENDOSCOPY;  Service: Gastroenterology;;   BIOPSY  01/26/2022   Procedure: BIOPSY;  Surgeon: Lemar Lofty., MD;  Location: [facility] ENDOSCOPY;  Service: Gastroenterology;;   BOTOX INJECTION  09/01/2018   Procedure: BOTOX INJECTION;  Surgeon: Kerin Salen, MD;  Location: [facility] ENDOSCOPY;  Service: Gastroenterology;;   BREAST SURGERY     breast biopsy   CHOLECYSTECTOMY     ESOPHAGEAL MANOMETRY N/A 10/07/2018   Procedure: ESOPHAGEAL MANOMETRY (EM);  Surgeon: Kerin Salen, MD;  Location: [facility];  Service: Gastroenterology;  Laterality: N/A;   ESOPHAGOGASTRODUODENOSCOPY N/A 10/25/2018   Procedure: ESOPHAGOGASTRODUODENOSCOPY (EGD);  Surgeon: Karie Soda, MD;  Location: [facility];  Service: General;  Laterality: N/A;   ESOPHAGOGASTRODUODENOSCOPY N/A 01/26/2022   Procedure: ESOPHAGOGASTRODUODENOSCOPY (EGD);  Surgeon: Lemar Lofty., MD;  Location: [facility];  Service: Gastroenterology;  Laterality: N/A;   ESOPHAGOGASTRODUODENOSCOPY (EGD) WITH PROPOFOL N/A 09/01/2018   Procedure: ESOPHAGOGASTRODUODENOSCOPY (EGD) WITH PROPOFOL;  Surgeon: Kerin Salen, MD;  Location: [facility] ENDOSCOPY;  Service: Gastroenterology;  Laterality: N/A;   ESOPHAGOGASTRODUODENOSCOPY (EGD) WITH PROPOFOL N/A 12/30/2021   Procedure: ESOPHAGOGASTRODUODENOSCOPY (EGD) WITH PROPOFOL;  Surgeon: Willis Modena, MD;  Location: [facility];  Service: Gastroenterology;  Laterality: N/A;   EUS N/A 01/26/2022   Procedure: UPPER ENDOSCOPIC ULTRASOUND (EUS) RADIAL;  Surgeon: Lemar Lofty., MD;  Location: [facility] END[facility];  Service: Gastroenterology;  Laterality: N/A;   FIDUCIAL MARKER PLACEMENT N/A 01/26/2022   Procedure: FIDUCIAL MARKER PLACEMENT;  Surgeon: Lemar Lofty., MD;  Location:  [facility]COPY;  Service: Gastroenterology;  Laterality: N/A;   FINE NEEDLE  ASPIRATION N/A 12/30/2021   Procedure: FINE NEEDLE ASPIRATION (FNA) LINEAR;  Surgeon: Willis Modena, MD;  Location: [facility]COPY;  Service: Gastroenterology;  Laterality: N/A;   FOREIGN BODY REMOVAL  01/26/2022   Procedure: FOREIGN BODY REMOVAL;  Surgeon: Meridee Score Netty Starring., MD;  Location: [facility] ENDOSCOPY;  Service: Gastroenterology;;   PACEMAKER IMPLANT N/A 03/06/2019   Procedure: PACEMAKER IMPLANT;  Surgeon: Marinus Maw, MD;  Location: [facility] INVASIVE CV LAB;  Service: Cardiovascular;  Laterality: N/A;   UPPER ESOPHAGEAL ENDOSCOPIC ULTRASOUND (EUS) Bilateral 12/30/2021   Procedure: UPPER ESOPHAGEAL ENDOSCOPIC ULTRASOUND (EUS);  Surgeon: Willis Modena, MD;  Location: [facility] ENDOSCOPY;  Service: Gastroenterology;  Laterality: Bilateral;   HPI:  87 y.o. female with medical history pancreatic ca, cardiac issues, adm with nonspecific weakness x few weeks, found to be in rapid atrial fibrillation. MRI brain: Scattered subcentimeter acute ischemic nonhemorrhagic infarcts involving the bilateral cerebral and cerebellar hemispheres, watershed in distribution.  Swallow and speech/language evaluation ordered.   Assessment / Plan / Recommendation Clinical Impression       SLP Assessment       Recommendations for follow up therapy are one component of a multi-disciplinary discharge planning process, led by the attending physician.  Recommendations may be updated based on patient status, additional functional criteria and insurance authorization.    Follow Up Recommendations    Follow up at next venue, more than 3 hours if able to tolerate   Assistance Recommended at Discharge   Full   Functional Status Assessment    Frequency and Duration   Min x1 week        SLP Evaluation Cognition  Overall Cognitive Status: Difficult to assess (due to language deficits, difficult to assess) Orientation Level: Oriented to person;Disoriented to person;Disoriented to situation (oriented to place with  yes/no) Memory:  (recognizes family members) Awareness: Impaired Awareness Impairment: Intellectual impairment (pt with decreased awareness to her language deficits - follows social cues appropriately)       Comprehension  Auditory Comprehension Overall Auditory Comprehension: Impaired Yes/No Questions: Impaired Basic Biographical Questions: 76-100% accurate (name, gender answered correctly) Basic Immediate Environment Questions: 0-24% accurate (SLP gender and granddaughter's relation answered incorrectly) Commands: Impaired One Step Basic Commands: 75-100% accurate (raise hand, protrude tongue, cough correctly; point to nose not correct) Conversation: Simple Interfering Components: Processing speed EffectiveTechniques: Visual/Gestural cues Visual Recognition/Discrimination Discrimination: Exceptions to [facility] Common Objects: Unable to indentify Reading Comprehension Reading Status:  (recognized her name from choice of 2; did not identify correct name of object with choice of 2)    Expression Expression Primary Mode of Expression: Verbal Verbal Expression Overall Verbal Expression: Impaired Initiation: Impaired Automatic Speech: Singing;Counting Repetition: Impaired Level of Impairment: Word level Naming: Impairment Responsive: 0-25% accurate Common Objects: Unable to indentify Verbal Errors: Phonemic paraphasias;Semantic paraphasias Pragmatics: No impairment Effective Techniques: Articulatory cues;Sentence completion Non-Verbal Means of Communication: Not applicable Written Expression Dominant Hand: Right Written Expression: Not tested   Oral / Motor  Oral Motor/Sensory Function Lingual Strength: Reduced Motor Speech Overall Motor Speech: Impaired at baseline Respiration: Impaired Level of Impairment: Phrase Phonation: Low vocal intensity Articulation: Impaired Intelligibility: Intelligibility reduced Word: 75-100% accurate Phrase: Not tested Sentence: Not  tested Conversation: Not tested Motor Planning: Impaired Level of Impairment: Word Motor Speech Errors: Inconsistent;Groping for words Interfering Components: Premorbid status           [name], MS [facility] SLP Acute [employer] [phone]  Donavan Burnet  Ann 11/26/2022, 2:59 PM

## 2022-11-26 NOTE — Progress Notes (Addendum)
Speech Language Pathology Treatment: Dysphagia  Patient Details Name: Shannon Ortiz MRN: [id number] DOB: Feb 19, 1936 Today's Date: 11/26/2022 Time: 4098-1191 SLP Time Calculation (min) (ACUTE ONLY): 10 min  Assessment / Plan / Recommendation Clinical Impression  Pt seen today to address swallow function. Reviewed MBS study with pt and her granddaughter *[place]* as well as reasoning for diet and swallow precautions.  Demonstrated use of oral suction for family to use at anterior oral cavity if pt does not swallow po.  Today pt would assist with mod assist *hand over hand* to hold her cup/spoon with intake including water and Ensure via straw as well as applesauce.  Swallow continues to be significantly delayed with post-swallow throat clearing noted - but no overt indication of aspiration.  Swallow much more efficient with liquid consumption via straw rather than purees.  Lingual pumping occurring, indicative of increased effort for pt to transit even applesauce.  At this time, full liquids = Ensures, Icecream, etc is most efficient and effective for this pt to swallow with least aspiration risk.  Recommend continue current diet with SLP conducting advancement diet trials and exercises to improve swallow timing. Given pt with gradual improvement today with timing of swallow, anticipate ongoing dysphagia abatement.    Of note, grandson yesterday had informed SLP that Shannon Ortiz would clear her throat at times with intake even prior to admission.  Family is very supportive of pt and encouraged her throughout session.      HPI HPI: 87 y.o. female with medical history pancreatic ca, cardiac issues, adm with nonspecific weakness x few weeks, found to be in rapid atrial fibrillation. MRI brain: Scattered subcentimeter acute ischemic nonhemorrhagic infarcts involving the bilateral cerebral and cerebellar hemispheres, watershed in distribution.  Swallow and speech/language evaluation ordered.      SLP Plan  Continue  with current plan of care      Recommendations for follow up therapy are one component of a multi-disciplinary discharge planning process, led by the attending physician.  Recommendations may be updated based on patient status, additional functional criteria and insurance authorization.    Recommendations  Diet recommendations: Thin liquid;Nectar-thick liquid Liquids provided via: Straw Medication Administration: Via alternative means Supervision: Patient able to self feed Compensations: Slow rate;Small sips/bites Postural Changes and/or Swallow Maneuvers: Seated upright 90 degrees;Upright 30-60 min after meal                  Oral care BID   Frequent or constant Supervision/Assistance Dysphagia, oropharyngeal phase (R13.12)     Continue with current plan of care   [name], Shannon [facility] SLP Acute Rehab Services Office [phone]   Chales Abrahams  11/26/2022, 4:58 PM

## 2022-11-26 NOTE — Evaluation (Addendum)
Physical Therapy Evaluation Patient Details Name: Shannon Ortiz MRN: [id number] DOB: 09/27/1935 Today's Date: 11/26/2022  History of Present Illness  87 y.o. female with PMH of sinus node dysfunction s/p PPM, HTN, atrial fibrillation on Eliquis, thyroid and pancreatic CA who was seen for afib with RVR and elevated troponin. 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  Clinical Impression  Pt admitted with above diagnosis.  Pt is independent at her baseline per family; presents today  with expressive language deficits, delayed motor planning, follows most basic commands with incr time and is overall min assist of 2 throughout session.  Will benefit from PT in acute setting to maximize independence, will likely need post acute rehab  VS:BP 165/81, SpO2=95% on RA, HR 70s  Pt currently with functional limitations due to the deficits listed below (see PT Problem List). Pt will benefit from acute skilled PT to increase their independence and safety with mobility to allow discharge.          Recommendations for follow up therapy are one component of a multi-disciplinary discharge planning process, led by the attending physician.  Recommendations may be updated based on patient status, additional functional criteria and insurance authorization.  Follow Up Recommendations Can patient physically be transported by private vehicle: No     Assistance Recommended at Discharge Intermittent Supervision/Assistance  Patient can return home with the following  A little help with walking and/or transfers;Help with stairs or ramp for entrance;A little help with bathing/dressing/bathroom;Assist for transportation;Assistance with cooking/housework    Equipment Recommendations None recommended by PT  Recommendations for Other Services       Functional Status Assessment Patient has had a recent decline in their functional status and demonstrates the ability to  make significant improvements in function in a reasonable and predictable amount of time.     Precautions / Restrictions Precautions Precautions: Fall Restrictions Weight Bearing Restrictions: No      Mobility  Bed Mobility Overal bed mobility: Needs Assistance Bed Mobility: Supine to Sit     Supine to sit: +2 for safety/equipment, Min assist     General bed mobility comments: cues to self assist, incr time, assist to elevate trunk and complete scooting to EOB    Transfers Overall transfer level: Needs assistance Equipment used: Rolling [place] (2 wheels) Transfers: Sit to/from Stand, Bed to chair/wheelchair/BSC Sit to Stand: Mod assist, Min assist, +2 physical assistance, +2 safety/equipment   Step pivot transfers: Min assist, +2 physical assistance, +2 safety/equipment       General transfer comment: delayed motor planning. multi-modal cues to power up to stand, assist to rise and find midline; posterior bias on standing. incontinent of stool in bed, pt tol standing x 5 minutes for peri-care; seated rest prior to second standing trial. assist to advance RLE, balance and maneuver RW for stand pivot    Ambulation/Gait               General Gait Details: deferred d/t fatigue and standing balance deficits  Stairs            Wheelchair Mobility    Modified Rankin (Stroke Patients Only)       Balance Overall balance assessment: Needs assistance Sitting-balance support: No upper extremity supported, Feet supported Sitting balance-Leahy Scale: Fair     Standing balance support: Reliant on assistive device for balance, During functional activity Standing balance-Leahy Scale: Poor Standing balance comment: with external assist and UE support pt able to participate  in marching, lateral wt shifting pre-gait activities                             Pertinent Vitals/Pain Pain Assessment Pain Assessment: Faces Pain Score: 0-No pain Facial  Expression: smiling or inexpressive Pain Intervention(s): Monitored during session    Home Living Family/patient expects to be discharged to:: Inpatient rehab Living Arrangements: Children Available Help at Discharge: Family Type of Home: House         Home Layout: One level Home Equipment: Agricultural consultant (2 wheels);Cane - single point      Prior Function Prior Level of Function : Independent/Modified Independent             Mobility Comments: amb with cane short distances, uses RW for longer distances; ADLs Comments: ind per family     Hand Dominance   Dominant Hand: Right    Extremity/Trunk Assessment   Upper Extremity Assessment Upper Extremity Assessment: Defer to OT evaluation    Lower Extremity Assessment Lower Extremity Assessment: RLE deficits/detail;LLE deficits/detail RLE Deficits / Details: grossly 3+ to 4/5, AROM WFL RLE Sensation: decreased proprioception RLE Coordination: decreased gross motor LLE Deficits / Details: grossly 3+ to 4/5, AROM WFL LLE Coordination: decreased gross motor       Communication   Communication: Expressive difficulties  Cognition Arousal/Alertness: Awake/alert Behavior During Therapy: WFL for tasks assessed/performed Overall Cognitive Status: Difficult to assess Area of Impairment: Problem solving                       Following Commands: Follows one step commands consistently     Problem Solving: Slow processing, Decreased initiation, Difficulty sequencing, Requires verbal cues, Requires tactile cues General Comments: unable to assess d/t language deficits; pt follows one step commands consistenly, laughs at family and staff's jokes appropriate but with significant expressive deficits        General Comments      Exercises     Assessment/Plan    PT Assessment Patient needs continued PT services  PT Problem List Decreased coordination;Decreased activity tolerance;Decreased balance;Decreased  mobility;Decreased knowledge of precautions;Decreased knowledge of use of DME;Decreased safety awareness       PT Treatment Interventions DME instruction;Therapeutic exercise;Gait training;Functional mobility training;Therapeutic activities;Patient/family education;Balance training;Neuromuscular re-education    PT Goals (Current goals can be found in the Care Plan section)  Acute Rehab PT Goals Patient Stated Goal: pt to return to active lifestyle PT Goal Formulation: With family Time For Goal Achievement: 12/10/22 Potential to Achieve Goals: Good    Frequency Min 1X/week     Co-evaluation               AM-PAC PT "6 Clicks" Mobility  Outcome Measure Help needed turning from your back to your side while in a flat bed without using bedrails?: A Little Help needed moving from lying on your back to sitting on the side of a flat bed without using bedrails?: A Little Help needed moving to and from a bed to a chair (including a wheelchair)?: Total Help needed standing up from a chair using your arms (e.g., wheelchair or bedside chair)?: Total Help needed to walk in hospital room?: Total Help needed climbing 3-5 steps with a railing? : Total 6 Click Score: 10    End of Session Equipment Utilized During Treatment: Gait belt Activity Tolerance: Patient limited by fatigue Patient left: with call bell/phone within reach;in chair;with chair alarm set;with family/visitor present  PT Visit Diagnosis: Other abnormalities of gait and mobility (R26.89);Difficulty in walking, not elsewhere classified (R26.2)    Time: 1610-9604 PT Time Calculation (min) (ACUTE ONLY): 29 min   Charges:   PT Evaluation $PT Eval Low Complexity: 1 Low PT Treatments $Gait Training: 8-22 mins        Delice Bison, PT  Acute Rehab Dept [facility]) [phone]  11/26/2022   [facility] 11/26/2022, 3:29 PM

## 2022-11-26 NOTE — Progress Notes (Addendum)
Rounding Note    Patient Name: Shannon Ortiz Date of Encounter: 11/26/2022  [facility] Cardiologist: Charlton Haws, MD   Subjective   Still mostly aphasic, able to say yes or no only. Family at bedside.   Inpatient Medications    Scheduled Meds:   stroke: early stages of recovery book   Does not apply Once   atorvastatin  20 mg Oral Daily   docusate sodium  100 mg Oral BID   feeding supplement  237 mL Oral BID BM   metoprolol succinate  50 mg Oral Daily   Continuous Infusions:  amiodarone Stopped (11/25/22 2156)   heparin 900 Units/hr (11/26/22 0517)   PRN Meds: acetaminophen **OR** acetaminophen, albuterol, ondansetron **OR** ondansetron (ZOFRAN) IV, polyethylene glycol, traZODone   Vital Signs    Vitals:   11/26/22 0315 11/26/22 0318 11/26/22 0517 11/26/22 0534  BP: (!) 178/83 (!) 162/80 (!) 158/79 (!) 149/81  Pulse: 66 64 66 63  Resp:      Temp: 98.6 F (37 C)   99.1 F (37.3 C)  TempSrc: Oral   Oral  SpO2: 96%   99%  Weight:      Height:        Intake/Output Summary (Last 24 hours) at 11/26/2022 1043 Last data filed at 11/26/2022 0700 Gross per 24 hour  Intake 659.13 ml  Output 1050 ml  Net -390.87 ml      11/23/2022    8:09 AM 05/15/2022   10:50 AM 03/27/2022    2:16 PM  Last 3 Weights  Weight (lbs) 121 lb 120 lb 4.8 oz 125 lb 3.2 oz  Weight (kg) 54.885 kg 54.568 kg 56.79 kg      Telemetry    NSR with occasionally paced rhythm, last episode of afib was around 1:30 PM on 11/25/2022 - Personally Reviewed  ECG    NSR without significant ST-T wave changes - Personally Reviewed  Physical Exam   GEN: No acute distress.  Only answer yes or no Neck: No JVD Cardiac: RRR, no murmurs, rubs, or gallops.  Respiratory: Clear to auscultation bilaterally. GI: Soft, nontender, non-distended  MS: No edema; No deformity. Neuro:  weak Psych: flat affect   Labs    High Sensitivity Troponin:   Recent Labs  Lab 11/23/22 0839 11/23/22 1140  11/23/22 1309 11/24/22 1046 11/24/22 1407  TROPONINIHS 158* 250* 330* 454* 442*     Chemistry Recent Labs  Lab 11/23/22 0839 11/24/22 0424 11/26/22 0441 11/26/22 0443  NA 133* 132*  --  131*  K 3.3* 3.7  --  3.3*  CL 98 102  --  101  CO2 24 23  --  21*  GLUCOSE 136* 97  --  89  BUN 20 17  --  10  CREATININE 1.00 0.76  --  0.63  CALCIUM 9.6 8.8*  --  8.5*  MG  --   --  1.9  --   PROT 7.9  --   --   --   ALBUMIN 3.6  --   --   --   AST 48*  --   --   --   ALT 21  --   --   --   ALKPHOS 167*  --   --   --   BILITOT 1.0  --   --   --   GFRNONAA 56* >60  --  >60  ANIONGAP 11 7  --  9    Lipids  Recent Labs  Lab  11/25/22 0827  CHOL 194  TRIG 80  HDL 76  LDLCALC 102*  CHOLHDL 2.6    Hematology Recent Labs  Lab 11/23/22 0839 11/24/22 0424 11/26/22 0443  WBC 13.6* 8.6 4.1  RBC 4.56 3.70* 3.85*  HGB 10.8* 9.0* 9.2*  HCT 33.1* 28.0* 28.1*  MCV 72.6* 75.7* 73.0*  MCH 23.7* 24.3* 23.9*  MCHC 32.6 32.1 32.7  RDW 16.5* 16.8* 15.9*  PLT 131* 102* 146*   Thyroid No results for input(s): "TSH", "FREET4" in the last 168 hours.  BNPNo results for input(s): "BNP", "PROBNP" in the last 168 hours.  DDimer No results for input(s): "DDIMER" in the last 168 hours.   Radiology    VAS US CAROTID (at [facility] and [facility] only)  Result Date: 11/26/2022 Carotid Arterial Duplex Study Patient Name:  [facility]  Date of Exam:   11/25/2022 Medical Rec #: [id number]      Accession #:    [id number] Date of Birth: 02/04/1936      Patient Gender: F Patient Age:   87 years Exam Location:  [facility] Procedure:      VAS US CAROTID Referring Phys: Marlin Canary --------------------------------------------------------------------------------  Indications:       CVA. Risk Factors:      Hypertension. Other Factors:     Afib, PM. Comparison Study:  No previous exams Performing Technologist: Jody Hill RVT, RDMS  Examination Guidelines: A complete evaluation includes B-mode imaging, spectral Doppler,  color Doppler, and power Doppler as needed of all accessible portions of each vessel. Bilateral testing is considered an integral part of a complete examination. Limited examinations for reoccurring indications may be performed as noted.  Right Carotid Findings: +----------+--------+--------+--------+------------------+------------------+           PSV cm/sEDV cm/sStenosisPlaque DescriptionComments           +----------+--------+--------+--------+------------------+------------------+ CCA Prox  41      14                                intimal thickening +----------+--------+--------+--------+------------------+------------------+ CCA Distal36      12              heterogenous      intimal thickening +----------+--------+--------+--------+------------------+------------------+ ICA Prox  38      17                                                   +----------+--------+--------+--------+------------------+------------------+ ICA Distal61      27                                tortuous           +----------+--------+--------+--------+------------------+------------------+ ECA       41      8                                                    +----------+--------+--------+--------+------------------+------------------+ +----------+--------+-------+----------------+-------------------+           PSV cm/sEDV cmsDescribe        Arm Pressure (mmHG) +----------+--------+-------+----------------+-------------------+ [id number]  Multiphasic, WNL                    +----------+--------+-------+----------------+-------------------+ +---------+--------+--+--------+-+---------+ VertebralPSV cm/s19EDV cm/s8Antegrade +---------+--------+--+--------+-+---------+  Left Carotid Findings: +----------+--------+--------+--------+------------------+------------------+           PSV cm/sEDV cm/sStenosisPlaque DescriptionComments            +----------+--------+--------+--------+------------------+------------------+ CCA Prox  42      11                                                   +----------+--------+--------+--------+------------------+------------------+ CCA Distal36      12              focal and calcificintimal thickening +----------+--------+--------+--------+------------------+------------------+ ICA Prox  35      17                                                   +----------+--------+--------+--------+------------------+------------------+ ICA Distal38      16                                                   +----------+--------+--------+--------+------------------+------------------+ ECA       38      7                                                    +----------+--------+--------+--------+------------------+------------------+ +----------+--------+--------+----------------+-------------------+           PSV cm/sEDV cm/sDescribe        Arm Pressure (mmHG) +----------+--------+--------+----------------+-------------------+ [id number]              Multiphasic, WNL                    +----------+--------+--------+----------------+-------------------+ +---------+--------+--+--------+-+---------+ VertebralPSV cm/s18EDV cm/s6Antegrade +---------+--------+--+--------+-+---------+   Summary: Right Carotid: The extracranial vessels were near-normal with only minimal wall                thickening or plaque. Left Carotid: The extracranial vessels were near-normal with only minimal wall               thickening or plaque. Vertebrals:  Bilateral vertebral arteries demonstrate antegrade flow. Subclavians: Normal flow hemodynamics were seen in bilateral subclavian              arteries. *See table(s) above for measurements and observations.  Electronically signed by Delia Heady MD on 11/26/2022 at 8:01:19 AM.    Final    CT ANGIO HEAD W OR WO CONTRAST  Result Date: 11/25/2022 CLINICAL  DATA:  Follow-up examination for stroke. EXAM: CT ANGIOGRAPHY HEAD TECHNIQUE: Multidetector CT imaging of the head was performed using the standard protocol during bolus administration of intravenous contrast. Multiplanar CT image reconstructions and MIPs were obtained to evaluate the vascular anatomy. RADIATION DOSE REDUCTION: This exam was performed according to the departmental dose-optimization program which includes automated exposure control, adjustment of the mA and/or kV according to patient size and/or use of  iterative reconstruction technique. CONTRAST:  75mL OMNIPAQUE IOHEXOL 350 MG/ML SOLN COMPARISON:  Comparison made with prior MRI from 11/24/2022. FINDINGS: CT HEAD Brain: Cerebral volume within normal limits. Mild chronic microvascular ischemic disease again noted. Previously identified scattered small volume ischemic infarcts are not well visualized by CT. No other new large vessel territory infarct. No intracranial hemorrhage. No mass lesion, mass effect or midline shift. No hydrocephalus or extra-axial fluid collection. Vascular: No abnormal hyperdense vessel. Scattered vascular calcifications noted within the carotid siphons. Skull: Scalp soft tissues and calvarium demonstrate no acute finding. Sinuses: Globes and orbital soft tissues within normal limits. Paranasal sinuses and mastoid air cells are clear. Other: None. CTA HEAD Anterior circulation: Visualized distal cervical segments of the internal carotid arteries are widely patent bilaterally. Mild atheromatous change within the carotid siphons without hemodynamically significant stenosis. A1 segments patent bilaterally. Normal anterior communicating complex. Both anterior cerebral arteries are widely patent without stenosis. No M1 stenosis or occlusion. Distal right MCA branches are well perfused. On the left, there is a focal filling defect at a distal left M2/M3 junction (series 10, image 62), consistent with thrombus. Perfusion is seen  distally. Posterior circulation: Both V4 segments patent without stenosis. Right vertebral artery dominant. Both PICA patent. Basilar patent without stenosis. Left SCA patent. Right SCA not well seen, but appears grossly patent as well. Both PCAs supplied primarily via the basilar. PCAs are patent distal aspects without stenosis or occlusion. Venous sinuses: Grossly patent allowing for timing the contrast bolus. Anatomic variants: None significant.  No aneurysm. Review of the MIP images confirms the above findings. IMPRESSION: CT HEAD IMPRESSION: 1. Previously identified scattered small volume ischemic infarcts are not well visualized by CT. No other new large vessel territory infarct, intracranial hemorrhage, or other new acute abnormality. 2. Underlying mild chronic microvascular ischemic disease. CTA HEAD: 1. Focal filling defect at a distal left M2/M3 junction, consistent with subocclusive thrombus. Perfusion is seen distally. 2. Otherwise wide patency of the major arterial vasculature of the head. No other large vessel occlusion. No hemodynamically significant or correctable stenosis. 3. Mild atheromatous change within the carotid siphons without hemodynamically significant stenosis. Electronically Signed   By: Rise Mu M.D.   On: 11/25/2022 22:25   DG Swallowing Func-Speech Pathology  Result Date: 11/25/2022 Table formatting from the original result was not included. Modified Barium Swallow Study Patient Details Name: Shannon Ortiz MRN: [id number] Date of Birth: [birth year] Today's Date: 11/25/2022 HPI/PMH: HPI: 87 y.o. female with medical history pancreatic ca, cardiac issues, adm with nonspecific weakness x few weeks, found to be in rapid atrial fibrillation. MRI brain: Scattered subcentimeter acute ischemic nonhemorrhagic infarcts involving the bilateral cerebral and cerebellar hemispheres, watershed in distribution.  Swallow and speech/language evaluation ordered. Clinical Impression: Clinical  Impression: Patient presents with severe oral and minimal pharyngeal dysphagia.  Lingual coordination and strength is severely impaired.  These lingual deficits resulting in excessively delayed oral transit with severe lingual pumping often resulting in boluses spilling into pharynx uncontrolled.  Patient required upwards of 40 seconds to transit small boluses of pudding mixed with apple sauce and once bolus was posterior to essentially gradually spilled further and to the pharynx without patient reflexively swallowing. She finally swallowed pudding and poorly masticated cracker with thin liquids.  Various techniques including counting 1-3 swallow, cued swallowing,  dry spoon to the mouth to help trigger swallow etc. were not effective.  Pharyngeal swallow is largely intact once it triggers and patient has very large piriform sinuses that  allows pooling of liquids prior to swallowing trigger.  She is at severe risk of malnutrition, dehydration with current level of dysphagia.  SLP advises patient start a full liquid diet (no pudding or grits) with very strict precautions including oral suctioning if she does not swallow and observing laryngeal elevation to assure swallow is triggered.  Patient did not cough nor clear her throat during this exam reflexively as was clinically observed during bedside swallow evaluation.  Suspect she has at least occasional laryngeal penetration of boluses or/and secretions to vocal folds.  Per family at baseline patient is feeding herself and managing swallowing well.  Based on their statement,  this appears to be a significant decline.   SLP will follow closely for p.o. tolerance, readiness for dietary advancement. Factors that may increase risk of adverse event in presence of aspiration Rubye Oaks & Clearance Coots 2021): Factors that may increase risk of adverse event in presence of aspiration Rubye Oaks & Clearance Coots 2021): Limited mobility; Weak cough; Reduced cognitive function; Dependence for  feeding and/or oral hygiene Recommendations/Plan: Swallowing Evaluation Recommendations Swallowing Evaluation Recommendations Recommendations: PO diet PO Diet Recommendation: -- (Full liquid diet no pudding or grits) Liquid Administration via: Straw; Cup Medication Administration: Via alternative means (Suspension, crushed with liquid, or via IV) Supervision: Full supervision/cueing for swallowing strategies Swallowing strategies  : Minimize environmental distractions; Slow rate; Small bites/sips (Provide liquids to aid swallowing with thicker consistencies.  Oral suction if patient does not swallow) Postural changes: Position pt fully upright for meals; Stay upright 30-60 min after meals (Patient must be fully upright for all p.o.) Oral care recommendations: Oral care BID (2x/day) Recommended consults: Consider Palliative care Caregiver Recommendations: Have oral suction available Treatment Plan Treatment Plan Treatment recommendations: Therapy as outlined in treatment plan below Follow-up recommendations: Follow physicians's recommendations for discharge plan and follow up therapies Functional status assessment: Patient has had a recent decline in their functional status and demonstrates the ability to make significant improvements in function in a reasonable and predictable amount of time. Treatment frequency: Min 1x/week Treatment duration: 1 week Interventions: Aspiration precaution training; Compensatory techniques; Patient/family education; Trials of upgraded texture/liquids; Diet toleration management by SLP Recommendations Recommendations for follow up therapy are one component of a multi-disciplinary discharge planning process, led by the attending physician.  Recommendations may be updated based on patient status, additional functional criteria and insurance authorization. Assessment: Orofacial Exam: Orofacial Exam Oral Cavity: Oral Hygiene: Pooled secretions Oral Cavity - Dentition: Dentures, top;  Dentures, bottom Orofacial Anatomy: WFL Oral Motor/Sensory Function: Unable to test (able to seal lips on a straw, spoon) Anatomy: Anatomy: WFL Boluses Administered: Boluses Administered Boluses Administered: Thin liquids (Level 0); Mildly thick liquids (Level 2, nectar thick); Puree; Solid  Oral Impairment Domain: Oral Impairment Domain Lip Closure: Escape beyond mid-chin Tongue control during bolus hold: Posterior escape of greater than half of bolus; Posterior escape of less than half of bolus Bolus preparation/mastication: Minimal chewing/mashing with majority of bolus unchewed; Disorganized chewing/mashing with solid pieces of bolus unchewed Bolus transport/lingual motion: Repetitive/disorganized tongue motion Oral residue: Trace residue lining oral structures; Residue collection on oral structures Location of oral residue : Tongue; Floor of mouth Initiation of pharyngeal swallow : Pyriform sinuses  Pharyngeal Impairment Domain: Pharyngeal Impairment Domain Soft palate elevation: No bolus between soft palate (SP)/pharyngeal wall (PW) Laryngeal elevation: Complete superior movement of thyroid cartilage with complete approximation of arytenoids to epiglottic petiole Anterior hyoid excursion: Complete anterior movement Epiglottic movement: Complete inversion Laryngeal vestibule closure: Complete, no air/contrast in laryngeal  vestibule Pharyngeal stripping wave : Present - complete Pharyngeal contraction (A/P view only): N/A Pharyngoesophageal segment opening: Complete distension and complete duration, no obstruction of flow Tongue base retraction: No contrast between tongue base and posterior pharyngeal wall (PPW) Pharyngeal residue: Trace residue within or on pharyngeal structures Location of pharyngeal residue: Tongue base; Pyriform sinuses  Esophageal Impairment Domain: Esophageal Impairment Domain Esophageal clearance upright position: Esophageal retention Pill: Esophageal Impairment Domain Esophageal  clearance upright position: Esophageal retention Penetration/Aspiration Scale Score: Penetration/Aspiration Scale Score 1.  Material does not enter airway: Mildly thick liquids (Level 2, nectar thick); Puree; Solid 2.  Material enters airway, remains ABOVE vocal cords then ejected out: Thin liquids (Level 0) Compensatory Strategies: Compensatory Strategies Compensatory strategies: Yes Straw: Effective Effective Straw: Thin liquid (Level 0) Liquid wash: Effective Effective Liquid Wash: Puree; Solid Other(comment): Ineffective Ineffective Other(comment): Thin liquid (Level 0); Mildly thick liquid (Level 2, nectar thick); Puree; Solid   General Information: Caregiver present: No  Diet Prior to this Study: NPO   No data recorded  Respiratory Status: WFL   Supplemental O2: None (Room air)   History of Recent Intubation: No  Behavior/Cognition: Alert; Doesn't follow directions; Other (Comment) Self-Feeding Abilities: Dependent for feeding Baseline vocal quality/speech: Hypophonia/low volume Volitional Cough: Unable to elicit Volitional Swallow: Unable to elicit Exam Limitations: No limitations Goal Planning: Prognosis for improved oropharyngeal function: Fair Barriers to Reach Goals: Severity of deficits No data recorded Patient/Family Stated Goal: for pt to be able to eat Consulted and agree with results and recommendations: Nurse; Pt unable/family or caregiver not available Pain: Pain Assessment Pain Assessment: No/denies pain Breathing: 0 Negative Vocalization: 0 Facial Expression: 0 Body Language: 0 Consolability: 0 PAINAD Score: 0 End of Session: Start Time:SLP Start Time (ACUTE ONLY): 1420 Stop Time: SLP Stop Time (ACUTE ONLY): 1440 Time Calculation:SLP Time Calculation (min) (ACUTE ONLY): 20 min Charges: SLP Evaluations $ SLP Speech Visit: 1 Visit SLP Evaluations $BSS Swallow: 1 Procedure $MBS Swallow: 1 Procedure $Swallowing Treatment: 1 Procedure SLP visit diagnosis: SLP Visit Diagnosis: Dysphagia, oropharyngeal  phase (R13.12) Past Medical History: Past Medical History: Diagnosis Date  Acquired diverticulum of distal esophagus with dysphagia 10/03/2018  AKI (acute kidney injury) (HCC) 08/31/2018  Anxiety   Bradycardia 02/16/2014  DDD (degenerative disc disease), lumbar   DJD (degenerative joint disease)   Esophageal stenosis 08/31/2018  Fibromyalgia   Hemorrhoid   Hypertension   Hypothyroidism   Osteopenia   Sigmoid diverticulosis   Thyroid nodule   PT DENIES Past Surgical History: Past Surgical History: Procedure Laterality Date  ABDOMINAL HYSTERECTOMY    COMPLETE  BALLOON DILATION N/A 09/01/2018  Procedure: BALLOON DILATION;  Surgeon: Kerin Salen, MD;  Location: [facility] ENDOSCOPY;  Service: Gastroenterology;  Laterality: N/A;  BIOPSY  09/01/2018  Procedure: BIOPSY;  Surgeon: Kerin Salen, MD;  Location: [facility] ENDOSCOPY;  Service: Gastroenterology;;  BIOPSY  01/26/2022  Procedure: BIOPSY;  Surgeon: Lemar Lofty., MD;  Location: [facility] ENDOSCOPY;  Service: Gastroenterology;;  BOTOX INJECTION  09/01/2018  Procedure: BOTOX INJECTION;  Surgeon: Kerin Salen, MD;  Location: [facility] ENDOSCOPY;  Service: Gastroenterology;;  BREAST SURGERY    breast biopsy  CHOLECYSTECTOMY    ESOPHAGEAL MANOMETRY N/A 10/07/2018  Procedure: ESOPHAGEAL MANOMETRY (EM);  Surgeon: Kerin Salen, MD;  Location: [facility] ENDOSCOPY;  Service: Gastroenterology;  Laterality: N/A;  ESOPHAGOGASTRODUODENOSCOPY N/A 10/25/2018  Procedure: ESOPHAGOGASTRODUODENOSCOPY (EGD);  Surgeon: Karie Soda, MD;  Location: [facility] ORS;  Service: General;  Laterality: N/A;  ESOPHAGOGASTRODUODENOSCOPY N/A 01/26/2022  Procedure: ESOPHAGOGASTRODUODENOSCOPY (EGD);  Surgeon: Lemar Lofty., MD;  Location: [facility] ENDOSCOPY;  Service: Gastroenterology;  Laterality: N/A;  ESOPHAGOGASTRODUODENOSCOPY (EGD) WITH PROPOFOL N/A 09/01/2018  Procedure: ESOPHAGOGASTRODUODENOSCOPY (EGD) WITH PROPOFOL;  Surgeon: Kerin Salen, MD;  Location: [facility] ENDOSCOPY;  Service: Gastroenterology;  Laterality: N/A;   ESOPHAGOGASTRODUODENOSCOPY (EGD) WITH PROPOFOL N/A 12/30/2021  Procedure: ESOPHAGOGASTRODUODENOSCOPY (EGD) WITH PROPOFOL;  Surgeon: Willis Modena, MD;  Location: [facility] ENDOSCOPY;  Service: Gastroenterology;  Laterality: N/A;  EUS N/A 01/26/2022  Procedure: UPPER ENDOSCOPIC ULTRASOUND (EUS) RADIAL;  Surgeon: Lemar Lofty., MD;  Location: [facility] ENDOSCOPY;  Service: Gastroenterology;  Laterality: N/A;  FIDUCIAL MARKER PLACEMENT N/A 01/26/2022  Procedure: FIDUCIAL MARKER PLACEMENT;  Surgeon: Lemar Lofty., MD;  Location: [facility] ENDOSCOPY;  Service: Gastroenterology;  Laterality: N/A;  FINE NEEDLE ASPIRATION N/A 12/30/2021  Procedure: FINE NEEDLE ASPIRATION (FNA) LINEAR;  Surgeon: Willis Modena, MD;  Location: [facility] ENDOSCOPY;  Service: Gastroenterology;  Laterality: N/A;  FOREIGN BODY REMOVAL  01/26/2022  Procedure: FOREIGN BODY REMOVAL;  Surgeon: Meridee Score Netty Starring., MD;  Location: [facility] ENDOSCOPY;  Service: Gastroenterology;;  PACEMAKER IMPLANT N/A 03/06/2019  Procedure: PACEMAKER IMPLANT;  Surgeon: Marinus Maw, MD;  Location: [facility] INVASIVE CV LAB;  Service: Cardiovascular;  Laterality: N/A;  UPPER ESOPHAGEAL ENDOSCOPIC ULTRASOUND (EUS) Bilateral 12/30/2021  Procedure: UPPER ESOPHAGEAL ENDOSCOPIC ULTRASOUND (EUS);  Surgeon: Willis Modena, MD;  Location: [facility] ENDOSCOPY;  Service: Gastroenterology;  Laterality: Bilateral; [name], MS [facility] SLP Acute Rehab Services Office [phone] Chales Abrahams 11/25/2022, 3:20 PM  MR BRAIN WO CONTRAST  Result Date: 11/24/2022 CLINICAL DATA:  Initial evaluation for neuro deficit, stroke. EXAM: MRI HEAD WITHOUT CONTRAST TECHNIQUE: Multiplanar, multiecho pulse sequences of the brain and surrounding structures were obtained without intravenous contrast. COMPARISON:  CT from 11/23/2022. FINDINGS: Brain: Examination degraded by motion artifact. Cerebral volume within normal limits. Mild chronic microvascular ischemic disease for age. Patchy scattered subcentimeter foci of  restricted diffusion are seen involving the bilateral cerebral and cerebellar hemispheres. Patchy cortical and subcortical involvement. Overall distribution is watershed in nature. No associated hemorrhage or mass effect. Gray-white matter differentiation maintained. No areas of chronic cortical infarction. No acute or chronic intracranial blood products. No mass lesion, midline shift or mass effect. No hydrocephalus or extra-axial fluid collection. Pituitary gland suprasellar region within normal limits. Vascular: Major intracranial vascular flow voids are maintained. Skull and upper cervical spine: Craniocervical junction within normal limits. Bone marrow signal intensity within normal limits. No scalp soft tissue abnormality. Sinuses/Orbits: Globes orbital soft tissues within normal limits. Paranasal sinuses are clear. No mastoid effusion. Other: None. IMPRESSION: 1. Scattered subcentimeter acute ischemic nonhemorrhagic infarcts involving the bilateral cerebral and cerebellar hemispheres, watershed in distribution. Overall pattern favors a hypoperfusion injury. 2. Underlying mild chronic microvascular ischemic disease for age. Electronically Signed   By: Rise Mu M.D.   On: 11/24/2022 18:18   ECHOCARDIOGRAM COMPLETE  Result Date: 11/24/2022    ECHOCARDIOGRAM REPORT   Patient Name:   [facility] Date of Exam: 11/24/2022 Medical Rec #:  [id number]     Height:       66.0 in Accession #:    [id number]    Weight:       121.0 lb Date of Birth:  07-31-1935     BSA:          1.615 m Patient Age:    86 years      BP:           165/86 mmHg Patient Gender: F             HR:  66 bpm. Exam Location:  Inpatient Procedure: 2D Echo, 3D Echo, Cardiac Doppler and Color Doppler Indications:    R07.9* Chest pain, unspecified. Acute AMS.  History:        Patient has prior history of Echocardiogram examinations, most                 recent 12/27/2018. Abnormal ECG and Pacemaker, Mitral Valve                  Prolapse, Mitral Valve Disease and Aortic Valve Disease,                 Arrythmias:Atrial Fibrillation and Bradycardia,                 Signs/Symptoms:Syncope; Risk Factors:Hypertension. Cancer.  Sonographer:    Sheralyn Boatman RDCS Referring Phys: [id number] Jonita Albee IMPRESSIONS  1. Left ventricular ejection fraction, by estimation, is 45 to 50%. The left ventricle has mildly decreased function. The left ventricle has no regional wall motion abnormalities. There is mild concentric left ventricular hypertrophy. Left ventricular diastolic parameters are consistent with Grade II diastolic dysfunction (pseudonormalization).  2. Right ventricular systolic function is normal. The right ventricular size is normal. There is moderately elevated pulmonary artery systolic pressure. The estimated right ventricular systolic pressure is 52.2 mmHg.  3. Left atrial size was severely dilated.  4. Right atrial size was moderately dilated.  5. The mitral valve is grossly normal. Mild mitral valve regurgitation. No evidence of mitral stenosis. There is mild holosystolic prolapse of the middle scallop of the posterior leaflet of the mitral valve.  6. Tricuspid valve regurgitation is mild to moderate.  7. Focal calcification of the non coronary cusp . The aortic valve is tricuspid. Aortic valve regurgitation is not visualized. Aortic valve sclerosis/calcification is present, without any evidence of aortic stenosis.  8. The inferior vena cava is dilated in size with <50% respiratory variability, suggesting right atrial pressure of 15 mmHg. Comparison(s): Changes from prior study are noted. The left ventricular function is worsened. LVEF now 45-50%. Mild MR. Mild to moderate TR. RVSP 52 mmHG. FINDINGS  Left Ventricle: Left ventricular ejection fraction, by estimation, is 45 to 50%. The left ventricle has mildly decreased function. The left ventricle has no regional wall motion abnormalities. The left ventricular internal cavity size was  normal in size. There is mild concentric left ventricular hypertrophy. Abnormal (paradoxical) septal motion, consistent with RV pacemaker. Left ventricular diastolic parameters are consistent with Grade II diastolic dysfunction (pseudonormalization). Right Ventricle: The right ventricular size is normal. No increase in right ventricular wall thickness. Right ventricular systolic function is normal. There is moderately elevated pulmonary artery systolic pressure. The tricuspid regurgitant velocity is 3.05 m/s, and with an assumed right atrial pressure of 15 mmHg, the estimated right ventricular systolic pressure is 52.2 mmHg. Left Atrium: Left atrial size was severely dilated. Right Atrium: Right atrial size was moderately dilated. Pericardium: There is no evidence of pericardial effusion. Mitral Valve: The mitral valve is grossly normal. There is mild holosystolic prolapse of the middle scallop of the posterior leaflet of the mitral valve. There is moderate thickening of the mitral valve leaflet(s). Mild mitral valve regurgitation. No evidence of mitral valve stenosis. Tricuspid Valve: The tricuspid valve is grossly normal. Tricuspid valve regurgitation is mild to moderate. No evidence of tricuspid stenosis. Aortic Valve: Focal calcification of the non coronary cusp. The aortic valve is tricuspid. Aortic valve regurgitation is not visualized. Aortic valve sclerosis/calcification is present, without any  evidence of aortic stenosis. Pulmonic Valve: The pulmonic valve was grossly normal. Pulmonic valve regurgitation is trivial. No evidence of pulmonic stenosis. Aorta: The aortic root and ascending aorta are structurally normal, with no evidence of dilitation. Venous: The inferior vena cava is dilated in size with less than 50% respiratory variability, suggesting right atrial pressure of 15 mmHg. IAS/Shunts: There is right bowing of the interatrial septum, suggestive of elevated left atrial pressure. The atrial septum  is grossly normal. Additional Comments: A device lead is visualized in the right atrium and right ventricle.  LEFT VENTRICLE PLAX 2D LVIDd:         4.00 cm   Diastology LVIDs:         3.30 cm   LV e' medial:    5.11 cm/s LV PW:         1.30 cm   LV E/e' medial:  12.3 LV IVS:        1.20 cm   LV e' lateral:   6.96 cm/s LVOT diam:     2.20 cm   LV E/e' lateral: 9.0 LV SV:         85 LV SV Index:   52 LVOT Area:     3.80 cm                           3D Volume EF:                          3D EF:        50 % RIGHT VENTRICLE             IVC RV S prime:     14.70 cm/s  IVC diam: 1.95 cm TAPSE (M-mode): 2.4 cm LEFT ATRIUM              Index        RIGHT ATRIUM           Index LA diam:        5.90 cm  3.65 cm/m   RA Area:     18.00 cm LA Vol (A2C):   128.0 ml 79.25 ml/m  RA Volume:   41.40 ml  25.63 ml/m LA Vol (A4C):   86.8 ml  53.74 ml/m LA Biplane Vol: 110.0 ml 68.10 ml/m  AORTIC VALVE             PULMONIC VALVE LVOT Vmax:   118.00 cm/s PR End Diast Vel: 1.91 msec LVOT Vmean:  73.100 cm/s LVOT VTI:    0.223 m  AORTA Ao Root diam: 2.90 cm Ao Asc diam:  3.20 cm MITRAL VALVE                  TRICUSPID VALVE MV Area (PHT): 3.39 cm       TR Peak grad:   37.2 mmHg MV Decel Time: 224 msec       TR Vmax:        305.00 cm/s MR Peak grad:    95.6 mmHg MR Mean grad:    60.0 mmHg    SHUNTS MR Vmax:         489.00 cm/s  Systemic VTI:  0.22 m MR Vmean:        360.0 cm/s   Systemic Diam: 2.20 cm MR PISA:         0.57 cm MR PISA Eff ROA: 4 mm MR PISA Radius:  0.30  cm MV E velocity: 62.60 cm/s MV A velocity: 40.25 cm/s MV E/A ratio:  1.56 Lennie Odor MD Electronically signed by Lennie Odor MD Signature Date/Time: 11/24/2022/12:16:24 PM    Final     Cardiac Studies   ECHOCARDIOGRAM COMPLETE 11/24/2022   IMPRESSIONS     1. Left ventricular ejection fraction, by estimation, is 45 to 50%. The left ventricle has mildly decreased function. The left ventricle has no regional wall motion abnormalities. There is mild  concentric left ventricular hypertrophy. Left ventricular diastolic parameters are consistent with Grade II diastolic dysfunction (pseudonormalization). 2. Right ventricular systolic function is normal. The right ventricular size is normal. There is moderately elevated pulmonary artery systolic pressure. The estimated right ventricular systolic pressure is 52.2 mmHg. 3. Left atrial size was severely dilated. 4. Right atrial size was moderately dilated. 5. The mitral valve is grossly normal. Mild mitral valve regurgitation. No evidence of mitral stenosis. There is mild holosystolic prolapse of the middle scallop of the posterior leaflet of the mitral valve. 6. Tricuspid valve regurgitation is mild to moderate. 7. Focal calcification of the non coronary cusp . The aortic valve is tricuspid. Aortic valve regurgitation is not visualized. Aortic valve sclerosis/calcification is present, without any evidence of aortic stenosis. 8. The inferior vena cava is dilated in size with <50% respiratory variability, suggesting right atrial pressure of 15 mmHg.   Comparison(s): Changes from prior study are noted. The left ventricular function is worsened. LVEF now 45-50%. Mild MR. Mild to moderate TR. RVSP 52 mmHG.    Patient Profile     87 y.o. female with PMH of sinus node dysfunction s/p PPM, HTN, atrial fibrillation on Eliquis, pancreatic CA who was seen for afib with RVR and elevated troponin. Note, patient was admitted with weakness and AMS, MRI showed acute CVA in watershed distribution  Assessment & Plan    PAF - known history of PAF with low burden on previous device interrogation - arrived during this admission with afib with RVR. Converted to NSR on IVF and metoprolol.  - on IV heparin, resume Eliquis when able. IV amiodarone stopped.   Elevated troponin: likely related to tachycardia, trop trended up to 454.   Cardiomyopathy: EF 45-50% on echo 11/24/2022. Previous Echo in July 2020 showed EF  55-60%. No ARNI/ARB with ACEI allergy.   St Jude Pacemaker: placed on 03/06/2019. Last interrogation 10/30/2022 showed 1.6% afib burden.   AMS with acute CVA: scattered subcentimeter acute ischemic nonhemorrhagic infarcts involving the watershed distribution, favors hypoperfusion injury. CTA of head however suggest subocclusive thrombus in distal L M2/M3 junction.       For questions or updates, please contact [facility] Please consult [email or web address] for contact info under        Signed, Azalee Course, PA  11/26/2022, 10:43 AM    History and all data above reviewed.  Patient examined.  I agree with the findings as above.   She is much more alert.  No pain.  The patient exam reveals COR:RRR  ,  Lungs: Clear  ,  Abd: Positive bowel sounds, no rebound no guarding, Ext No edema  .  All available labs, radiology testing, previous records reviewed. Agree with documented assessment and plan.   Atrial fib:  Now maintaining NSR.  Continue current meds.       Fayrene Fearing [place] Soy  12:03 PM  11/26/2022

## 2022-11-26 NOTE — Progress Notes (Signed)
OT Cancellation Note  Patient Details Name: Shannon Ortiz MRN: [id number] DOB: 1935/10/10   Cancelled Treatment:    Reason Eval/Treat Not Completed: Patient at procedure or test/ unavailable: Pt working with SLP. Will attempt next service date or as soon as possible.   Theodoro Clock 11/26/2022, 1:56 PM

## 2022-11-26 NOTE — Progress Notes (Signed)
ANTICOAGULATION CONSULT NOTE  Pharmacy Consult for heparin  Indication: atrial fibrillation - bridge therapy while apixaban on hold  Allergies  Allergen Reactions   Ace Inhibitors Cough   Elavil [Amitriptyline Hcl] Hives   Ketek [Telithromycin] Hives    Patient Measurements: Height: 5\' 6"  (167.6 cm) Weight: 54.9 kg (121 lb) IBW/kg (Calculated) : 59.3 Heparin Dosing Weight: 54.9 kg  Vital Signs: Temp: 98.9 F (37.2 C) (06/13 1200) Temp Source: Oral (06/13 1200) BP: 166/114 (06/13 1059) Pulse Rate: 67 (06/13 1059)  Labs: Recent Labs    11/24/22 0424 11/24/22 1046 11/24/22 1407 11/25/22 1032 11/25/22 1032 11/25/22 1833 11/26/22 0443 11/26/22 1318  HGB 9.0*  --   --   --   --   --  9.2*  --   HCT 28.0*  --   --   --   --   --  28.1*  --   PLT 102*  --   --   --   --   --  146*  --   APTT  --   --   --  35   < > 58* 60* 83*  HEPARINUNFRC  --   --   --  0.80*  --   --   --   --   CREATININE 0.76  --   --   --   --   --  0.63  --   TROPONINIHS  --  454* 442*  --   --   --   --   --    < > = values in this interval not displayed.     Estimated Creatinine Clearance: 45.4 mL/min (by C-G formula based on SCr of 0.63 mg/dL).  Assessment: 87 yo F on apixaban 2.5 mg po bid PTA for PAF.  Pharmacy consulted to dose heparin for bridge therapy while pt NPO for new watershed stroke on MRI 11/24/22.  Last dose of apixaban 2.5 mg 6/11 at 0841 am  Today, 11/26/2022: aPTT 83 seconds after rate increased to 900 units/hr - therapeutic Hg 9.2, low/stable, PLT 146 low/stable No bleeding noted  Goal of Therapy:  Heparin level 0.3 - 0.5 units/ml aPTT 66-85 seconds Monitor platelets by anticoagulation protocol: Yes   Plan:  No bolus per MD orders; aim for lower stroke goal of aPTT 66-86 seconds & heparin level 0.3 - 0.5 units/mL Continue heparin rate @ 900 units/hr Daily CBC & heparin level; daily aPTTs as needed until DOAC anti-Xa effects have worn off Per neuro OK to change back  to Eliquis when taking PO but unfortunately failed swallow eval  Herby Abraham, Pharm.D Use secure chat for questions 11/26/2022 2:07 PM

## 2022-11-27 DIAGNOSIS — I4891 Unspecified atrial fibrillation: Secondary | ICD-10-CM | POA: Diagnosis not present

## 2022-11-27 LAB — CBC
HCT: 27.8 % — ABNORMAL LOW (ref 36.0–46.0)
Hemoglobin: 9.4 g/dL — ABNORMAL LOW (ref 12.0–15.0)
MCH: 24 pg — ABNORMAL LOW (ref 26.0–34.0)
MCHC: 33.8 g/dL (ref 30.0–36.0)
MCV: 71.1 fL — ABNORMAL LOW (ref 80.0–100.0)
Platelets: 171 10*3/uL (ref 150–400)
RBC: 3.91 MIL/uL (ref 3.87–5.11)
RDW: 15.4 % (ref 11.5–15.5)
WBC: 4.3 10*3/uL (ref 4.0–10.5)
nRBC: 0 % (ref 0.0–0.2)

## 2022-11-27 LAB — BASIC METABOLIC PANEL
Anion gap: 8 (ref 5–15)
BUN: 9 mg/dL (ref 8–23)
CO2: 23 mmol/L (ref 22–32)
Calcium: 8.9 mg/dL (ref 8.9–10.3)
Chloride: 102 mmol/L (ref 98–111)
Creatinine, Ser: 0.62 mg/dL (ref 0.44–1.00)
GFR, Estimated: >60 mL/min (ref >60)
Glucose, Bld: 95 mg/dL (ref 70–99)
Potassium: 3.4 mmol/L — ABNORMAL LOW (ref 3.5–5.1)
Sodium: 133 mmol/L — ABNORMAL LOW (ref 135–145)

## 2022-11-27 LAB — HEPARIN LEVEL (UNFRACTIONATED): Heparin Unfractionated: 0.27 IU/mL — ABNORMAL LOW (ref 0.30–0.70)

## 2022-11-27 LAB — APTT: aPTT: 70 seconds — ABNORMAL HIGH (ref 24–36)

## 2022-11-27 MED ORDER — POTASSIUM CHLORIDE 20 MEQ PO PACK
40.0000 meq | PACK | Freq: Once | ORAL | Status: AC
  Administered 2022-11-27: 40 meq via ORAL
  Filled 2022-11-27: qty 2

## 2022-11-27 MED ORDER — APIXABAN 2.5 MG PO TABS
2.5000 mg | ORAL_TABLET | Freq: Two times a day (BID) | ORAL | Status: DC
  Administered 2022-11-27 – 2022-12-03 (×13): 2.5 mg via ORAL
  Filled 2022-11-27 (×13): qty 1

## 2022-11-27 MED ORDER — POTASSIUM CHLORIDE CRYS ER 20 MEQ PO TBCR: 40.0000 meq | EXTENDED_RELEASE_TABLET | Freq: Once | ORAL | Status: DC

## 2022-11-27 MED ORDER — LOSARTAN POTASSIUM 50 MG PO TABS
100.0000 mg | ORAL_TABLET | Freq: Every morning | ORAL | Status: DC
  Administered 2022-11-27 – 2022-12-03 (×7): 100 mg via ORAL
  Filled 2022-11-27 (×7): qty 2

## 2022-11-27 MED ORDER — AMLODIPINE BESYLATE 10 MG PO TABS
10.0000 mg | ORAL_TABLET | Freq: Every day | ORAL | Status: DC
  Administered 2022-11-27 – 2022-12-03 (×6): 10 mg via ORAL
  Filled 2022-11-27 (×8): qty 1

## 2022-11-27 NOTE — Evaluation (Signed)
Occupational Therapy Evaluation Patient Details Name: Shannon Ortiz MRN: [id number] DOB: 1936-01-06 Today's Date: 11/27/2022   History of Present Illness 87 y.o. female with PMH of sinus node dysfunction s/p PPM, HTN, atrial fibrillation on Eliquis, thyroid and pancreatic CA who was seen for afib with RVR and elevated troponin. 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   Clinical Impression   Patient is currently requiring assistance with ADLs including up to moderate assist with Lower body ADLs, up to moderate assist with Upper body BSC/recliner using stand pivot and RW.  Current level of function is below patient's typical baseline of Modified independent.  During this evaluation, patient was limited by generalized weakness, impaired activity tolerance, expressive and receptive difficulties and mild RT sided inattention and weakness compared to LT, all of which has the potential to impact patient's safety and independence during functional mobility, as well as performance for ADLs.  Patient lives with family, who were not present during OT visit therefore family availability unknown for now.  Patient demonstrates very good rehab potential, and should benefit from continued skilled occupational therapy services while in acute care to maximize safety, independence and quality of life at home.  Continued occupational therapy services are recommended.  ?      Recommendations for follow up therapy are one component of a multi-disciplinary discharge planning process, led by the attending physician.  Recommendations may be updated based on patient status, additional functional criteria and insurance authorization.   Assistance Recommended at Discharge Frequent or constant Supervision/Assistance  Patient can return home with the following Two people to help with walking and/or transfers;A lot of help with bathing/dressing/bathroom;A lot of help with  walking and/or transfers    Functional Status Assessment  Patient has had a recent decline in their functional status and demonstrates the ability to make significant improvements in function in a reasonable and predictable amount of time.  Equipment Recommendations   (TBD)    Recommendations for Other Services       Precautions / Restrictions Precautions Precautions: Fall Restrictions Weight Bearing Restrictions: No      Mobility Bed Mobility Overal bed mobility: Needs Assistance Bed Mobility: Supine to Sit     Supine to sit: Min assist (Nurse saline locked IV for ease before mobilizing)          Transfers                          Balance Overall balance assessment: Needs assistance Sitting-balance support: No upper extremity supported, Feet supported Sitting balance-Leahy Scale: Fair     Standing balance support: Reliant on assistive device for balance, During functional activity Standing balance-Leahy Scale: Poor                             ADL either performed or assessed with clinical judgement   ADL Overall ADL's : Needs assistance/impaired Eating/Feeding: Minimal assistance;Cueing for sequencing Eating/Feeding Details (indicate cue type and reason): Pt initiated feeding with RT hand, showing dysmetria with use of RT hand finding food in bowl with need of intermittent hand over hand cues. Some spilling with soup and drinks. Towels placed to protect pt from hot coffee. Pt able to open some containers and hope with others. Grooming: Wash/dry hands;Wash/dry face;Sitting;Set up;Min guard   Upper Body Bathing: Set up;Cueing for sequencing;Min guard;Sitting   Lower Body Bathing: Moderate assistance;Sit to/from stand;Sitting/lateral  leans   Upper Body Dressing : Minimal assistance;Cueing for sequencing;Sitting   Lower Body Dressing: Sitting/lateral leans;Moderate assistance;Sit to/from stand Lower Body Dressing Details (indicate cue type  and reason): Based on general assessment. Toilet Transfer: Minimal assistance;Moderate assistance;Rolling Wagar (2 wheels);Stand-pivot Statistician Details (indicate cue type and reason): Pt stood from EOB to RW with Min As.  Pt with very small, slow "steps" to pivot to recliner with Mod As. Toileting- Clothing Manipulation and Hygiene: Maximal assistance;Bed level;Cueing for sequencing Toileting - Clothing Manipulation Details (indicate cue type and reason): Has had B/B incontinence. Max As for peri hygiene. Pt able to sit EOB and bath anterior peri area with close supervision and cues. Wash cloth placed in hand as pt unable to pinch and lift from bed.     Functional mobility during ADLs: Minimal assistance;Moderate assistance;Rolling Bouwman (2 wheels)       Vision   Additional Comments: Absent smooth pursuits, absent tracking to test, however pt will follow faces of staff with her eyes and makes good eye contact. Sees food on plate, but with dysmetria.     Perception     Praxis      Pertinent Vitals/Pain Pain Assessment Pain Assessment: Faces Faces Pain Scale: No hurt Breathing: normal Negative Vocalization: none Facial Expression: smiling or inexpressive Body Language: relaxed Consolability: no need to console PAINAD Score: 0     Hand Dominance Right   Extremity/Trunk Assessment Upper Extremity Assessment Upper Extremity Assessment: RUE deficits/detail;LUE deficits/detail RUE Deficits / Details: ROM WFL. Slight downward drift on 10 second shoulder flexion test. Right inattention with cues to use RT hand for tasks except eating, when pt initated use of spoon with RT hand.  MMT appears fairl equal to LUE, ~4-/5 shoulder, elbow, hand just slightly weaker.  Pt showing dysmetria with use of RT hand finding food in bowl with need of intermittent hand over hand cues. RUE Sensation:  (Unable to assess due to expressive difficulties.) RUE Coordination: decreased fine motor LUE  Deficits / Details: Generalized weakness but [facility] for basic ADLs.   Lower Extremity Assessment Lower Extremity Assessment: Defer to PT evaluation RLE Deficits / Details: grossly 3+ to 4/5, AROM WFL RLE Sensation: decreased proprioception RLE Coordination: decreased gross motor LLE Deficits / Details: grossly 3+ to 4/5, AROM WFL LLE Coordination: decreased gross motor       Communication Communication Communication: Expressive difficulties   Cognition Arousal/Alertness: Awake/alert Behavior During Therapy: WFL for tasks assessed/performed Overall Cognitive Status: Difficult to assess Area of Impairment: Problem solving, Following commands                       Following Commands: Follows one step commands consistently, Follows multi-step commands inconsistently     Problem Solving: Slow processing, Decreased initiation, Difficulty sequencing, Requires verbal cues, Requires tactile cues General Comments: unable to assess d/t language deficits; pt follows one step commands consistenly, laughs at staff's jokes appropriate but with significant expressive deficits and possible receptive deficits.     General Comments       Exercises     Shoulder Instructions      Home Living Family/patient expects to be discharged to:: Inpatient rehab Living Arrangements: Children Available Help at Discharge: Family Type of Home: House       Home Layout: One level     Bathroom Shower/Tub:  (Family not present to give bathroom updates)         Home Equipment: Agricultural consultant (2 wheels);Cane - single point  Prior Functioning/Environment Prior Level of Function : Independent/Modified Independent             Mobility Comments: amb with cane short distances, uses RW for longer distances; ADLs Comments: ind per family (Taken from PT Eval on 6/13)        OT Problem List: Decreased strength;Impaired UE functional use;Impaired balance (sitting and/or  standing);Impaired vision/perception;Cardiopulmonary status limiting activity;Decreased coordination      OT Treatment/Interventions: Self-care/ADL training;Therapeutic activities;Cognitive remediation/compensation;Therapeutic exercise;Neuromuscular education;Visual/perceptual remediation/compensation;Patient/family education;DME and/or AE instruction;Balance training;Manual therapy    OT Goals(Current goals can be found in the care plan section) Acute Rehab OT Goals OT Goal Formulation: Patient unable to participate in goal setting Time For Goal Achievement: 12/11/22 Potential to Achieve Goals: Good ADL Goals Pt Will Perform Grooming: with supervision;standing (while using RUE ad lib and reaching for appropraite grooming supplies for each task via visual cues.) Pt Will Perform Lower Body Dressing: with set-up;with supervision;sitting/lateral leans;sit to/from stand Pt Will Transfer to Toilet: with supervision;ambulating Pt Will Perform Toileting - Clothing Manipulation and hygiene: with adaptive equipment;with supervision;sitting/lateral leans;sit to/from stand (AE if needed) Pt/caregiver will Perform Home Exercise Program: Increased strength;Both right and left upper extremity;With Supervision (With focus on RUE coordination, strength, and attention to RT side of body.) Additional ADL Goal #1: Pt will demonstrate improved attention to RT side and RT visual field by completing all meals, including food and drink in RT visual field, with use of compensatory scanning.  OT Frequency: Min 1X/week    Co-evaluation              AM-PAC OT "6 Clicks" Daily Activity     Outcome Measure Help from another person eating meals?: A Lot Help from another person taking care of personal grooming?: A Little Help from another person toileting, which includes using toliet, bedpan, or urinal?: A Lot Help from another person bathing (including washing, rinsing, drying)?: A Lot Help from another person to  put on and taking off regular upper body clothing?: A Little Help from another person to put on and taking off regular lower body clothing?: A Lot 6 Click Score: 14   End of Session Equipment Utilized During Treatment: Gait belt;Rolling Stinnette (2 wheels) Nurse Communication: Other (comment) (RN present for OT session)  Activity Tolerance: Patient tolerated treatment well Patient left: in chair;with call bell/phone within reach;with chair alarm set  OT Visit Diagnosis: Hemiplegia and hemiparesis;Muscle weakness (generalized) (M62.81);Cognitive communication deficit (R41.841);Other symptoms and signs involving cognitive function;Feeding difficulties (R63.3) Symptoms and signs involving cognitive functions: Cerebral infarction Hemiplegia - Right/Left: Right Hemiplegia - dominant/non-dominant: Dominant Hemiplegia - caused by: Cerebral infarction                Time: 1610-9604 OT Time Calculation (min): 39 min Charges:  OT General Charges $OT Visit: 1 Visit OT Evaluation $OT Eval Moderate Complexity: 1 Mod OT Treatments $Self Care/Home Management : 8-22 mins $Therapeutic Activity: 8-22 mins  Victorino Dike, OT Acute Rehab Services Office: [phone] 11/27/2022  Theodoro Clock 11/27/2022, 9:37 AM

## 2022-11-27 NOTE — Consult Note (Signed)
Consultation Note Date: 11/27/2022   Patient Name: Shannon Ortiz  DOB: 1935/07/12  MRN: [id number]  Age / Sex: 87 y.o., female  PCP: Shannon Floro, MD Referring Physician: Joseph Art, DO  Reason for Consultation: Establishing goals of care  HPI/Patient Profile: 87 y.o. female   admitted on 11/23/2022    Clinical Assessment and Goals of Care: D47-year-old lady who lives at home with her daughter.  Past medical history of sinus node dysfunction status post pacemaker, history of hypertension atrial fibrillation is on Eliquis, pancreatic cancer undergoes surveillance, admitted with generalized weakness rapid atrial fibrillation.  Found to have CVA on MRI.  SLP following. MRI with scattered subcentimeter acute ischemic nonhemorrhagic infarcts involving bilateral cerebral and cerebellar hemispheres in watershed distribution-hypoperfusion injury.  SLP following and patient noted to have severe swallowing issues.  PT OT also consulted. Palliative consult for ongoing goals of care discussions has been requested. Palliative consult request received. Chart reviewed-oncologic history reviewed.  Also reviewed about scope of current hospitalization.  Patient's son, daughter-in-law and several other family members at bedside.  Bedside nursing colleague also present. Palliative medicine is specialized medical care for people living with serious illness. It focuses on providing relief from the symptoms and stress of a serious illness. The goal is to improve quality of life for both the patient and the family. Goals of care: Broad aims of medical therapy in relation to the patient's values and preferences. Our aim is to provide medical care aimed at enabling patients to achieve the goals that matter most to them, given the circumstances of their particular medical situation and their constraints.    HCPOA  Daughter  Shannon Ortiz.   SUMMARY OF RECOMMENDATIONS   Goals wishes and values attempted to be explored.  Advance care planning discussions undertaken.  CODE STATUS discussions undertaken with family at bedside.  Patient's daughter Shannon Ortiz is designated healthcare power of attorney agent.  At the present time, patient and family's goals are to continue current mode of care and they remain invested in continuing therapies and treatments aimed at stabilization/recovery.  Patient remains full code/full scope care for now.  At this time, patient and family is considering skilled nursing facility rehabilitation attempt.  Explained to them about scope of palliative services inside a SNF for rehab and they are in agreement. Recommend outpatient palliative services, continue current mode of care for now. Thank you for the consult.  Code Status/Advance Care Planning: Full code   Symptom Management:     Palliative Prophylaxis:  Delirium Protocol  Additional Recommendations (Limitations, Scope, Preferences): Full Scope Treatment  Psycho-social/Spiritual:  Desire for further Chaplaincy support:yes Additional Recommendations: Caregiving  Support/Resources  Prognosis:  Unable to determine  Discharge Planning: Skilled Nursing Facility for rehab with Palliative care service follow-up      Primary Diagnoses: Present on Admission:  Atrial fibrillation with rapid ventricular response (HCC)  AMS (altered mental status)   I have reviewed the medical record, interviewed the patient and family, and examined the patient. The following aspects are  pertinent.  Past Medical History:  Diagnosis Date   Acquired diverticulum of distal esophagus with dysphagia 10/03/2018   AKI (acute kidney injury) (HCC) 08/31/2018   Anxiety    Bradycardia 02/16/2014   DDD (degenerative disc disease), lumbar    DJD (degenerative joint disease)    Esophageal stenosis 08/31/2018   Fibromyalgia    Hemorrhoid    Hypertension     Hypothyroidism    Osteopenia    Sigmoid diverticulosis    Thyroid nodule    PT DENIES   Social History   Socioeconomic History   Marital status: Widowed    Spouse name: Not on file   Number of children: Not on file   Years of education: Not on file   Highest education level: Not on file  Occupational History   Not on file  Tobacco Use   Smoking status: Never   Smokeless tobacco: Never  Vaping Use   Vaping Use: Never used  Substance and Sexual Activity   Alcohol use: No   Drug use: No   Sexual activity: Never  Other Topics Concern   Not on file  Social History Narrative   Not on file   Social Determinants of Health   Financial Resource Strain: Not on file  Food Insecurity: No Food Insecurity (11/23/2022)   Hunger Vital Sign    Worried About Running Out of Food in the Last Year: Never true    Ran Out of Food in the Last Year: Never true  Transportation Needs: No Transportation Needs (11/23/2022)   PRAPARE - Administrator, Civil Service (Medical): No    Lack of Transportation (Non-Medical): No  Physical Activity: Not on file  Stress: Not on file  Social Connections: Not on file   Family History  Problem Relation Age of Onset   Hypertension Mother    Diabetes Mother        non-insulin dependant   CAD Brother    Cancer Maternal Aunt    Cancer Maternal Uncle    Scheduled Meds:  amLODipine  10 mg Oral Daily   apixaban  2.5 mg Oral BID   atorvastatin  20 mg Oral Daily   docusate sodium  100 mg Oral BID   feeding supplement  237 mL Oral BID BM   losartan  100 mg Oral q AM   metoprolol succinate  50 mg Oral Daily   Continuous Infusions: PRN Meds:.acetaminophen **OR** acetaminophen, albuterol, ondansetron **OR** ondansetron (ZOFRAN) IV, polyethylene glycol, traZODone Medications Prior to Admission:  Prior to Admission medications   Medication Sig Start Date End Date Taking? Authorizing Provider  amLODipine (NORVASC) 10 MG tablet Take 1 tablet (10 mg  total) by mouth daily. Please call our office to schedule an appt for future refills. Thank you 2nd attempt. 11/18/22  Yes Marinus Maw, MD  apixaban (ELIQUIS) 2.5 MG TABS tablet Take 1 tablet (2.5 mg total) by mouth 2 (two) times daily. 09/09/22  Yes Marinus Maw, MD  losartan (COZAAR) 100 MG tablet Take 100 mg by mouth in the morning. 01/12/19  Yes [provider]  metoprolol succinate (TOPROL-XL) 50 MG 24 hr tablet Take 50 mg by mouth in the morning. 07/07/21  Yes [provider]  pantoprazole (PROTONIX) 20 MG tablet Take 20 mg by mouth daily.   Yes [provider]  potassium chloride SA (KLOR-CON M) 20 MEQ tablet Take 1 tablet (20 mEq total) by mouth daily. 05/28/22  Yes Malachy Mood, MD  docusate sodium (COLACE)  100 MG capsule Take 100 mg by mouth 2 (two) times daily. Patient not taking: Reported on 11/23/2022    [provider]  feeding supplement (ENSURE SURGERY) LIQD Take 237 mLs by mouth 2 (two) times daily between meals. Patient taking differently: Take 237 mLs by mouth 3 (three) times a week. 10/17/18   Almon Hercules, MD  ondansetron (ZOFRAN-ODT) 4 MG disintegrating tablet Take 4 mg by mouth every 8 (eight) hours as needed for nausea. Patient not taking: Reported on 11/23/2022 12/17/21   [provider]  pantoprazole (PROTONIX) 40 MG tablet Take 1 tablet (40 mg total) by mouth daily. Patient not taking: Reported on 11/23/2022 01/26/22   Mansouraty, Netty Starring., MD  polyethylene glycol (MIRALAX) 17 g packet Take 17 g by mouth daily. Patient not taking: Reported on 01/21/2022 12/28/21   Georga Kaufmann T, PA-C  potassium chloride (KLOR-CON) 20 MEQ packet 1 packet (20 meq) 2 times daily for 3 days. Then 1 packet (20 meq) daily for 2 weeks Patient not taking: Reported on 11/23/2022 05/15/22   Malachy Mood, MD   Allergies  Allergen Reactions   Ace Inhibitors Cough   Elavil [Amitriptyline Hcl] Hives   Ketek [Telithromycin] Hives   Review of Systems Elderly lady  sitting up in bed, is able to follow commands, verbalize some and  able to drink with assistance. Physical Exam Awake alert attempts to interact some, follows commands Regular work of breathing Unlabored respirations Extremities intact No edema  Vital Signs: BP (!) 144/81 (BP Location: Right Arm)   Pulse 61   Temp 98.4 F (36.9 C) (Oral)   Resp 20   Ht 5\' 6"  (1.676 m)   Wt 55.2 kg   SpO2 98%   BMI 19.64 kg/m  Pain Scale: 0-10   Pain Score: 0-No pain   SpO2: SpO2: 98 % O2 Device:SpO2: 98 % O2 Flow Rate: .   IO: Intake/output summary:  Intake/Output Summary (Last 24 hours) at 11/27/2022 1422 Last data filed at 11/27/2022 1204 Gross per 24 hour  Intake 520 ml  Output 400 ml  Net 120 ml    LBM: Last BM Date : 11/26/22 Baseline Weight: Weight: 54.9 kg Most recent weight: Weight: 55.2 kg     Palliative Assessment/Data:   Palliative performance scale 50%.  Time In: 1320 Time Out: 1420 Time Total: 60 Greater than 50%  of this time was spent counseling and coordinating care related to the above assessment and plan.  Signed by: Rosalin Hawking, MD   Please contact Palliative Medicine Team phone at [phone] for questions and concerns.  For individual provider: See Loretha Stapler

## 2022-11-27 NOTE — TOC Progression Note (Signed)
Transition of Care [facility]) - Progression Note    Patient Details  Name: Shannon Ortiz MRN: [id number] Date of Birth: 1936-01-17  [facility] CM/SW Contact  Larrie Kass, LCSW Phone Number: 11/27/2022, 2:09 PM  Clinical Narrative:    CSW spoke with pt's son regarding SNF placement. He reports he would like pt information sent out to only [place], [place], [facility] . CSW explained the process , pt will need insurance authorization. CSW to send pt's information out. TOC to follow.    Expected Discharge Plan: Skilled Nursing Facility Barriers to Discharge: Continued Medical Work up  Expected Discharge Plan and Services       Living arrangements for the past 2 months: Single Family Home                                       Social Determinants of Health (SDOH) Interventions SDOH Screenings   Food Insecurity: No Food Insecurity (11/23/2022)  Housing: Patient Declined (11/23/2022)  Transportation Needs: No Transportation Needs (11/23/2022)  Utilities: Not At Risk (11/23/2022)  Tobacco Use: Low Risk  (11/24/2022)    Readmission Risk Interventions     No data to display

## 2022-11-27 NOTE — NC FL2 (Signed)
[place] MEDICAID FL2 LEVEL OF CARE FORM     IDENTIFICATION  Patient Name: Shannon Ortiz Birthdate: [birth year] Sex: female Admission Date (Current Location): 11/23/2022  [facility] and [place] Number:  Producer, television/film/video and Address:         Provider Number: [id number]  Attending Physician Name and Address:  Joseph Art, DO  Relative Name and Phone Number:  Ronn Melena (Daughter) [phone] (Mobile)    Current Level of Care: Hospital Recommended Level of Care: Skilled Nursing Facility Prior Approval Number:    Date Approved/Denied:   PASRR Number: [id number]  Discharge Plan: SNF    Current Diagnoses: Patient Active Problem List   Diagnosis Date Noted   AMS (altered mental status) 11/24/2022   Atrial fibrillation with rapid ventricular response (HCC) 11/23/2022   Genetic testing 02/09/2022   Monoallelic mutation of LZTR1 gene 02/09/2022   Pancreatic cancer (HCC) 01/09/2022   Aortic atherosclerosis (HCC) 12/26/2021   Generalized abdominal pain 05/11/2021   Paroxysmal atrial fibrillation (HCC) 06/22/2019   Pacemaker 06/22/2019   Sinus node dysfunction (HCC) 03/06/2019   Weakness generalized 12/27/2018   Elevated troponin 12/26/2018   Hypomagnesemia 10/28/2018   Hiatal hernia s/p robotic repair 10/25/2018 10/25/2018   Status post robotic [place] (posterior partial) fundoplication 10/25/2018 10/25/2018   Esophageal stricture 10/15/2018   Acquired diverticulum of distal esophagus s/p robotic excision & myotomy 10/25/2018 10/03/2018   Esophageal stenosis 08/31/2018   Hypokalemia 08/31/2018   AKI (acute kidney injury) (HCC) 08/31/2018   Bradycardia 02/16/2014   Near syncope 02/16/2014   HTN (hypertension) 02/16/2014    Orientation RESPIRATION BLADDER Height & Weight     Self  Normal Incontinent Weight: 121 lb 11.1 oz (55.2 kg) Height:  5\' 6"  (167.6 cm)  BEHAVIORAL SYMPTOMS/MOOD NEUROLOGICAL BOWEL NUTRITION STATUS      Incontinent Diet (full liquid)   AMBULATORY STATUS COMMUNICATION OF NEEDS Skin   Limited Assist Verbally Normal                       Personal Care Assistance Level of Assistance  Bathing, Feeding, Dressing Bathing Assistance: Limited assistance Feeding assistance: Independent Dressing Assistance: Limited assistance     Functional Limitations Info  Sight, Hearing, Speech Sight Info: Adequate Hearing Info: Adequate Speech Info: Adequate    SPECIAL CARE FACTORS FREQUENCY  PT (By licensed PT), OT (By licensed OT)     PT Frequency: 5 x a week OT Frequency: 5 x a week            Contractures Contractures Info: Not present    Additional Factors Info  Code Status, Allergies Code Status Info: full Allergies Info: Ace Inhibitors  Elavil (Amitriptyline Hcl)  Ketek (Telithromycin)           Current Medications (11/27/2022):  This is the current hospital active medication list Current Facility-Administered Medications  Medication Dose Route Frequency Provider Last Rate Last Admin   acetaminophen (TYLENOL) tablet 650 mg  650 mg Oral Q6H PRN Kirby Crigler, Mir M, MD       Or   acetaminophen (TYLENOL) suppository 650 mg  650 mg Rectal Q6H PRN Kirby Crigler, Mir M, MD       albuterol (PROVENTIL) (2.5 MG/3ML) 0.083% nebulizer solution 2.5 mg  2.5 mg Nebulization Q2H PRN Kirby Crigler, Mir M, MD       amLODipine (NORVASC) tablet 10 mg  10 mg Oral Daily Vann, Jessica U, DO   10 mg at 11/27/22 1028   apixaban (ELIQUIS) tablet 2.5 mg  2.5 mg Oral BID Armandina Stammer, RPH   2.5 mg at 11/27/22 1028   atorvastatin (LIPITOR) tablet 20 mg  20 mg Oral Daily Harolyn Rutherford, MD   20 mg at 11/27/22 1028   docusate sodium (COLACE) capsule 100 mg  100 mg Oral BID Kirby Crigler, Mir M, MD   100 mg at 11/27/22 1028   feeding supplement (ENSURE SURGERY) liquid 237 mL  237 mL Oral BID BM Kirby Crigler, Mir M, MD   237 mL at 11/27/22 1338   losartan (COZAAR) tablet 100 mg  100 mg Oral q AM Marlin Canary U, DO   100 mg at 11/27/22 1028    metoprolol succinate (TOPROL-XL) 24 hr tablet 50 mg  50 mg Oral Daily Vann, Jessica U, DO   50 mg at 11/27/22 1028   ondansetron (ZOFRAN) tablet 4 mg  4 mg Oral Q6H PRN Kirby Crigler, Mir M, MD       Or   ondansetron [facility]) injection 4 mg  4 mg Intravenous Q6H PRN Kirby Crigler, Mir M, MD       polyethylene glycol (MIRALAX / GLYCOLAX) packet 17 g  17 g Oral Daily PRN Kirby Crigler, Mir M, MD       traZODone (DESYREL) tablet 25 mg  25 mg Oral QHS PRN Maryln Gottron, MD         Discharge Medications: Please see discharge summary for a list of discharge medications.  Relevant Imaging Results:  Relevant Lab Results:   Additional Information SSN:[id number]  Valentina Shaggy Nayquan Evinger, LCSW

## 2022-11-27 NOTE — Progress Notes (Signed)
Speech Language Pathology Treatment: Dysphagia  Patient Details Name: Shannon Ortiz MRN: [id number] DOB: 1936-05-10 Today's Date: 11/27/2022 Time: 4098-1191 SLP Time Calculation (min) (ACUTE ONLY): 24 min  Assessment / Plan / Recommendation Clinical Impression  Pt seen for follow-up dysphagia tx with potential for progression of diet and aphasia tx.  Pt answered via gestures with head nod/shake and was able to state "Right" and "yes sir" when asked about simple information.  She was unable to repeat information at word level or state her name/DOB, but shook her head in agreement when asked personal information.  Pt unable to follow 2-step commands, but 1-step commands were consistent with cueing provided. Certain oral commands were unable to be performed by pt, so oral apraxia/apraxia of speech may also be a consideration paired with aphasia.   Pt expelled thin liquids from oral cavity x1, but was able to initiate a swallow with max multimodal cues provided such as counting, using firm pressure with spoon/straw and medial placement of bolus in oral cavity with other consistencies.  Pt's swallow was initiated with nectar-thick liquids with delay being 8+ seconds (10+ sec with puree) with lingual pumping noted with all consistencies and max verbal/tactile cues required to initiate swallow.  Concerns for adequate nutrition/hydration given small amount trialed today, but nursing staff stated she ate well earlier in shift.  ST will continue to follow during acute stay for dysphagia/aphasia tx and advancement of diet.     HPI HPI: 87 y.o. female with medical history pancreatic ca, cardiac issues, adm with nonspecific weakness x few weeks, found to be in rapid atrial fibrillation. MRI brain: Scattered subcentimeter acute ischemic nonhemorrhagic infarcts involving the bilateral cerebral and cerebellar hemispheres, watershed in distribution. Swallow and speech/language evaluation completed with full liquids  recommended per MBS d/t timing concerns; severe oral phase dysphagia.  Aphasia tx recommended.  ST f/u for aphasia/swallow tx.      SLP Plan  Continue with current plan of care      Recommendations for follow up therapy are one component of a multi-disciplinary discharge planning process, led by the attending physician.  Recommendations may be updated based on patient status, additional functional criteria and insurance authorization.    Recommendations  Diet recommendations: Thin liquid;Nectar-thick liquid Liquids provided via: Straw Medication Administration: Via alternative means Supervision: Patient able to self feed;Staff to assist with self feeding;Full supervision/cueing for compensatory strategies Compensations: Slow rate;Small sips/bites;Other (Comment) (suction orally if pt does not initiate swallow; watch for swallow prior to next bite/sip) Postural Changes and/or Swallow Maneuvers: Seated upright 90 degrees;Upright 30-60 min after meal                  Oral care BID   Frequent or constant Supervision/Assistance Dysphagia, oropharyngeal phase (R13.12)     Continue with current plan of care     Pat Meghan Warshawsky,M.S., CCC-SLP  11/27/2022, 4:09 PM

## 2022-11-27 NOTE — Progress Notes (Signed)
ANTICOAGULATION CONSULT NOTE  Pharmacy Consult for heparin  Indication: atrial fibrillation - bridge therapy while apixaban on hold  Allergies  Allergen Reactions   Ace Inhibitors Cough   Elavil [Amitriptyline Hcl] Hives   Ketek [Telithromycin] Hives   Patient Measurements: Height: 5\' 6"  (167.6 cm) Weight: 55.2 kg (121 lb 11.1 oz) IBW/kg (Calculated) : 59.3 Heparin Dosing Weight: 54.9 kg  Vital Signs: Temp: 98.3 F (36.8 C) (06/14 0530) Temp Source: Oral (06/14 0530) BP: 180/91 (06/14 0530) Pulse Rate: 63 (06/14 0530)  Labs: Recent Labs    11/24/22 1046 11/24/22 1407 11/25/22 1032 11/25/22 1833 11/26/22 0443 11/26/22 1318 11/27/22 0415  HGB  --   --   --   --  9.2*  --  9.4*  HCT  --   --   --   --  28.1*  --  27.8*  PLT  --   --   --   --  146*  --  171  APTT  --   --  35   < > 60* 83* 70*  HEPARINUNFRC  --   --  0.80*  --   --   --  0.27*  CREATININE  --   --   --   --  0.63  --  0.62  TROPONINIHS 454* 442*  --   --   --   --   --    < > = values in this interval not displayed.    Estimated Creatinine Clearance: 45.6 mL/min (by C-G formula based on SCr of 0.62 mg/dL).  Assessment: 87 yo F on apixaban 2.5 mg po bid PTA for PAF.  Pharmacy consulted to dose heparin for bridge therapy while pt NPO for new watershed stroke on MRI 11/24/22.  Last dose of apixaban 2.5 mg 6/11 at 0841 am  Today, 11/27/2022: Tolerating other PO meds aPTT 70 seconds, heparin level down to 0.27 Hgb and plts low but stable No bleeding noted  Goal of Therapy:  Heparin level 0.3 - 0.5 units/ml aPTT 66-85 seconds Monitor platelets by anticoagulation protocol: Yes   Plan:  Transition back to Eliquis 2.5mg  PO BID this morning Stop heparin infusion when giving first dose of Eliquis Monitor CBC, s/s of bleed  Enzo Bi, PharmD, BCPS, BCIDP Clinical Pharmacist 11/27/2022 7:37 AM

## 2022-11-27 NOTE — Progress Notes (Signed)
PROGRESS NOTE    Shannon Ortiz  WUJ:[id number] DOB: 1935/12/26 DOA: 11/23/2022 PCP: Daisy Floro, MD    Brief Narrative:  87 y.o. female with medical history significant for sinus node dysfunction with pacemaker, hypertension, atrial fibrillation on Eliquis and pancreatic cancer undergoing surveillance, being admitted to the hospital for a couple of weeks of nonspecific weakness, found to be in rapid atrial fibrillation.  Also patient has been confused which is changed from her baseline.  CT head was unremarkable. In the ED she was given IV metoprolol and was converted back to normal sinus rhythm.  She was found to have significantly elevated troponin, cardiology was consulted. Also found to have CVA on MRI.  Main issue swallowing and speech.   Assessment and Plan: CVA -MRI: Scattered subcentimeter acute ischemic nonhemorrhagic infarcts involving the bilateral cerebral and cerebellar hemispheres, watershed in distribution. Overall pattern favors a hypoperfusion injury. -FLP-- LDL: 102- will need statin once taking PO, HgbA1c: 4.9 -SLP consult- diet ordered and MBS done-- severe swallowing issues -PT/OT consult -neurology consult:    Paroxysmal atrial fibrillation with RVR -resume PO BB -resume eliquis   Hypokalemia -replete  Elevated troponin -No chest pain, likely from demand ischemia from atrial fibrillation with RVR -Cardiology consulted; echocardiogram shows reduced EF ventricular hypokinesis -Cardiology following   Hypertension -see above-- per neuro, no need for permissive HTN -resume home meds  Overall concern for poor prognosis - if not able to swallow/speak etc-- history of pancreatic cancer--- will ask pallaitive care to see for GOC   DVT prophylaxis: apixaban (ELIQUIS) tablet 2.5 mg Start: 11/27/22 1000 SCDs Start: 11/23/22 1116 apixaban (ELIQUIS) tablet 2.5 mg    Code Status: Full Code Family Communication: at bedside-- son Gaynell Face-- please  update  Disposition Plan:  Level of care: Progressive Status is: Inpatient Remains inpatient appropriate     Consultants:  Cards Neuro Palliative care   Subjective: Following more commands, speech still impaired  Objective: Vitals:   11/26/22 2300 11/27/22 0006 11/27/22 0530 11/27/22 1028  BP: (!) 162/94 (!) 162/89 (!) 180/91 (!) 140/86  Pulse:   63 80  Resp: 17  18   Temp:  98.7 F (37.1 C) 98.3 F (36.8 C)   TempSrc:  Oral Oral   SpO2:  98% 100%   Weight:   55.2 kg   Height:        Intake/Output Summary (Last 24 hours) at 11/27/2022 1212 Last data filed at 11/27/2022 1204 Gross per 24 hour  Intake 520 ml  Output 400 ml  Net 120 ml   Filed Weights   11/23/22 0809 11/27/22 0530  Weight: 54.9 kg 55.2 kg    Examination:   General: Appearance:    Thin female in no acute distress     Lungs:      respirations unlabored  Heart:    Normal heart rate.    MS:   All extremities are intact.    Neurologic:   Awake, alert- does not speak -- will smile -- now following commands       Data Reviewed: I have personally reviewed following labs and imaging studies  CBC: Recent Labs  Lab 11/23/22 0839 11/24/22 0424 11/26/22 0443 11/27/22 0415  WBC 13.6* 8.6 4.1 4.3  HGB 10.8* 9.0* 9.2* 9.4*  HCT 33.1* 28.0* 28.1* 27.8*  MCV 72.6* 75.7* 73.0* 71.1*  PLT 131* 102* 146* 171   Basic Metabolic Panel: Recent Labs  Lab 11/23/22 0839 11/24/22 0424 11/26/22 0441 11/26/22 0443 11/27/22 0415  NA  133* 132*  --  131* 133*  K 3.3* 3.7  --  3.3* 3.4*  CL 98 102  --  101 102  CO2 24 23  --  21* 23  GLUCOSE 136* 97  --  89 95  BUN 20 17  --  10 9  CREATININE 1.00 0.76  --  0.63 0.62  CALCIUM 9.6 8.8*  --  8.5* 8.9  MG  --   --  1.9  --   --    GFR: Estimated Creatinine Clearance: 45.6 mL/min (by C-G formula based on SCr of 0.62 mg/dL). Liver Function Tests: Recent Labs  Lab 11/23/22 0839  AST 48*  ALT 21  ALKPHOS 167*  BILITOT 1.0  PROT 7.9  ALBUMIN 3.6    No results for input(s): "LIPASE", "AMYLASE" in the last 168 hours. No results for input(s): "AMMONIA" in the last 168 hours. Coagulation Profile: No results for input(s): "INR", "PROTIME" in the last 168 hours. Cardiac Enzymes: No results for input(s): "CKTOTAL", "CKMB", "CKMBINDEX", "TROPONINI" in the last 168 hours. BNP (last 3 results) No results for input(s): "PROBNP" in the last 8760 hours. HbA1C: Recent Labs    11/25/22 0827  HGBA1C 4.9   CBG: Recent Labs  Lab 11/23/22 0830  GLUCAP 127*   Lipid Profile: Recent Labs    11/25/22 0827  CHOL 194  HDL 76  LDLCALC 102*  TRIG 80  CHOLHDL 2.6   Thyroid Function Tests: No results for input(s): "TSH", "T4TOTAL", "FREET4", "T3FREE", "THYROIDAB" in the last 72 hours. Anemia Panel: No results for input(s): "VITAMINB12", "FOLATE", "FERRITIN", "TIBC", "IRON", "RETICCTPCT" in the last 72 hours. Sepsis Labs: No results for input(s): "PROCALCITON", "LATICACIDVEN" in the last 168 hours.  Recent Results (from the past 240 hour(s))  SARS Coronavirus 2 by RT PCR (hospital order, performed in [facility] hospital lab) *cepheid single result test* Anterior Nasal Swab     Status: None   Collection Time: 11/23/22  9:04 AM   Specimen: Anterior Nasal Swab  Result Value Ref Range Status   SARS Coronavirus 2 by RT PCR NEGATIVE NEGATIVE Final    Comment: (NOTE) SARS-CoV-2 target nucleic acids are NOT DETECTED.  The SARS-CoV-2 RNA is generally detectable in upper and lower respiratory specimens during the acute phase of infection. The lowest concentration of SARS-CoV-2 viral copies this assay can detect is 250 copies / mL. A negative result does not preclude SARS-CoV-2 infection and should not be used as the sole basis for treatment or other patient management decisions.  A negative result may occur with improper specimen collection / handling, submission of specimen other than nasopharyngeal swab, presence of viral mutation(s) within  the areas targeted by this assay, and inadequate number of viral copies (<250 copies / mL). A negative result must be combined with clinical observations, patient history, and epidemiological information.  Fact Sheet for Patients:   RoadLapTop.co.za  Fact Sheet for Healthcare Providers: http://kim-miller.com/  This test is not yet approved or  cleared by the [place] FDA and has been authorized for detection and/or diagnosis of SARS-CoV-2 by FDA under an Emergency Use Authorization (EUA).  This EUA will remain in effect (meaning this test can be used) for the duration of the COVID-19 declaration under Section 564(b)(1) of the Act, 21 U.S.C. section 360bbb-3(b)(1), unless the authorization is terminated or revoked sooner.  Performed at [facility], [address], [place], [place] [address]          Radiology Studies: CT ANGIO HEAD  W OR WO CONTRAST  Result Date: 11/25/2022 CLINICAL DATA:  Follow-up examination for stroke. EXAM: CT ANGIOGRAPHY HEAD TECHNIQUE: Multidetector CT imaging of the head was performed using the standard protocol during bolus administration of intravenous contrast. Multiplanar CT image reconstructions and MIPs were obtained to evaluate the vascular anatomy. RADIATION DOSE REDUCTION: This exam was performed according to the departmental dose-optimization program which includes automated exposure control, adjustment of the mA and/or kV according to patient size and/or use of iterative reconstruction technique. CONTRAST:  75mL OMNIPAQUE IOHEXOL 350 MG/ML SOLN COMPARISON:  Comparison made with prior MRI from 11/24/2022. FINDINGS: CT HEAD Brain: Cerebral volume within normal limits. Mild chronic microvascular ischemic disease again noted. Previously identified scattered small volume ischemic infarcts are not well visualized by CT. No other new large vessel territory infarct. No intracranial  hemorrhage. No mass lesion, mass effect or midline shift. No hydrocephalus or extra-axial fluid collection. Vascular: No abnormal hyperdense vessel. Scattered vascular calcifications noted within the carotid siphons. Skull: Scalp soft tissues and calvarium demonstrate no acute finding. Sinuses: Globes and orbital soft tissues within normal limits. Paranasal sinuses and mastoid air cells are clear. Other: None. CTA HEAD Anterior circulation: Visualized distal cervical segments of the internal carotid arteries are widely patent bilaterally. Mild atheromatous change within the carotid siphons without hemodynamically significant stenosis. A1 segments patent bilaterally. Normal anterior communicating complex. Both anterior cerebral arteries are widely patent without stenosis. No M1 stenosis or occlusion. Distal right MCA branches are well perfused. On the left, there is a focal filling defect at a distal left M2/M3 junction (series 10, image 62), consistent with thrombus. Perfusion is seen distally. Posterior circulation: Both V4 segments patent without stenosis. Right vertebral artery dominant. Both PICA patent. Basilar patent without stenosis. Left SCA patent. Right SCA not well seen, but appears grossly patent as well. Both PCAs supplied primarily via the basilar. PCAs are patent distal aspects without stenosis or occlusion. Venous sinuses: Grossly patent allowing for timing the contrast bolus. Anatomic variants: None significant.  No aneurysm. Review of the MIP images confirms the above findings. IMPRESSION: CT HEAD IMPRESSION: 1. Previously identified scattered small volume ischemic infarcts are not well visualized by CT. No other new large vessel territory infarct, intracranial hemorrhage, or other new acute abnormality. 2. Underlying mild chronic microvascular ischemic disease. CTA HEAD: 1. Focal filling defect at a distal left M2/M3 junction, consistent with subocclusive thrombus. Perfusion is seen distally. 2.  Otherwise wide patency of the major arterial vasculature of the head. No other large vessel occlusion. No hemodynamically significant or correctable stenosis. 3. Mild atheromatous change within the carotid siphons without hemodynamically significant stenosis. Electronically Signed   By: Rise Mu M.D.   On: 11/25/2022 22:25   DG Swallowing Func-Speech Pathology  Result Date: 11/25/2022 Table formatting from the original result was not included. Modified Barium Swallow Study Patient Details Name: Rashona Geno MRN: [id number] Date of Birth: Sep 08, 1935 Today's Date: 11/25/2022 HPI/PMH: HPI: 87 y.o. female with medical history pancreatic ca, cardiac issues, adm with nonspecific weakness x few weeks, found to be in rapid atrial fibrillation. MRI brain: Scattered subcentimeter acute ischemic nonhemorrhagic infarcts involving the bilateral cerebral and cerebellar hemispheres, watershed in distribution.  Swallow and speech/language evaluation ordered. Clinical Impression: Clinical Impression: Patient presents with severe oral and minimal pharyngeal dysphagia.  Lingual coordination and strength is severely impaired.  These lingual deficits resulting in excessively delayed oral transit with severe lingual pumping often resulting in boluses spilling into pharynx uncontrolled.  Patient required upwards  of 40 seconds to transit small boluses of pudding mixed with apple sauce and once bolus was posterior to essentially gradually spilled further and to the pharynx without patient reflexively swallowing. She finally swallowed pudding and poorly masticated cracker with thin liquids.  Various techniques including counting 1-3 swallow, cued swallowing,  dry spoon to the mouth to help trigger swallow etc. were not effective.  Pharyngeal swallow is largely intact once it triggers and patient has very large piriform sinuses that allows pooling of liquids prior to swallowing trigger.  She is at severe risk of malnutrition,  dehydration with current level of dysphagia.  SLP advises patient start a full liquid diet (no pudding or grits) with very strict precautions including oral suctioning if she does not swallow and observing laryngeal elevation to assure swallow is triggered.  Patient did not cough nor clear her throat during this exam reflexively as was clinically observed during bedside swallow evaluation.  Suspect she has at least occasional laryngeal penetration of boluses or/and secretions to vocal folds.  Per family at baseline patient is feeding herself and managing swallowing well.  Based on their statement,  this appears to be a significant decline.   SLP will follow closely for p.o. tolerance, readiness for dietary advancement. Factors that may increase risk of adverse event in presence of aspiration Rubye Oaks & Clearance Coots 2021): Factors that may increase risk of adverse event in presence of aspiration Rubye Oaks & Clearance Coots 2021): Limited mobility; Weak cough; Reduced cognitive function; Dependence for feeding and/or oral hygiene Recommendations/Plan: Swallowing Evaluation Recommendations Swallowing Evaluation Recommendations Recommendations: PO diet PO Diet Recommendation: -- (Full liquid diet no pudding or grits) Liquid Administration via: Straw; Cup Medication Administration: Via alternative means (Suspension, crushed with liquid, or via IV) Supervision: Full supervision/cueing for swallowing strategies Swallowing strategies  : Minimize environmental distractions; Slow rate; Small bites/sips (Provide liquids to aid swallowing with thicker consistencies.  Oral suction if patient does not swallow) Postural changes: Position pt fully upright for meals; Stay upright 30-60 min after meals (Patient must be fully upright for all p.o.) Oral care recommendations: Oral care BID (2x/day) Recommended consults: Consider Palliative care Caregiver Recommendations: Have oral suction available Treatment Plan Treatment Plan Treatment  recommendations: Therapy as outlined in treatment plan below Follow-up recommendations: Follow physicians's recommendations for discharge plan and follow up therapies Functional status assessment: Patient has had a recent decline in their functional status and demonstrates the ability to make significant improvements in function in a reasonable and predictable amount of time. Treatment frequency: Min 1x/week Treatment duration: 1 week Interventions: Aspiration precaution training; Compensatory techniques; Patient/family education; Trials of upgraded texture/liquids; Diet toleration management by SLP Recommendations Recommendations for follow up therapy are one component of a multi-disciplinary discharge planning process, led by the attending physician.  Recommendations may be updated based on patient status, additional functional criteria and insurance authorization. Assessment: Orofacial Exam: Orofacial Exam Oral Cavity: Oral Hygiene: Pooled secretions Oral Cavity - Dentition: Dentures, top; Dentures, bottom Orofacial Anatomy: WFL Oral Motor/Sensory Function: Unable to test (able to seal lips on a straw, spoon) Anatomy: Anatomy: WFL Boluses Administered: Boluses Administered Boluses Administered: Thin liquids (Level 0); Mildly thick liquids (Level 2, nectar thick); Puree; Solid  Oral Impairment Domain: Oral Impairment Domain Lip Closure: Escape beyond mid-chin Tongue control during bolus hold: Posterior escape of greater than half of bolus; Posterior escape of less than half of bolus Bolus preparation/mastication: Minimal chewing/mashing with majority of bolus unchewed; Disorganized chewing/mashing with solid pieces of bolus unchewed Bolus transport/lingual motion: Repetitive/disorganized  tongue motion Oral residue: Trace residue lining oral structures; Residue collection on oral structures Location of oral residue : Tongue; Floor of mouth Initiation of pharyngeal swallow : Pyriform sinuses  Pharyngeal Impairment  Domain: Pharyngeal Impairment Domain Soft palate elevation: No bolus between soft palate (SP)/pharyngeal wall (PW) Laryngeal elevation: Complete superior movement of thyroid cartilage with complete approximation of arytenoids to epiglottic petiole Anterior hyoid excursion: Complete anterior movement Epiglottic movement: Complete inversion Laryngeal vestibule closure: Complete, no air/contrast in laryngeal vestibule Pharyngeal stripping wave : Present - complete Pharyngeal contraction (A/P view only): N/A Pharyngoesophageal segment opening: Complete distension and complete duration, no obstruction of flow Tongue base retraction: No contrast between tongue base and posterior pharyngeal wall (PPW) Pharyngeal residue: Trace residue within or on pharyngeal structures Location of pharyngeal residue: Tongue base; Pyriform sinuses  Esophageal Impairment Domain: Esophageal Impairment Domain Esophageal clearance upright position: Esophageal retention Pill: Esophageal Impairment Domain Esophageal clearance upright position: Esophageal retention Penetration/Aspiration Scale Score: Penetration/Aspiration Scale Score 1.  Material does not enter airway: Mildly thick liquids (Level 2, nectar thick); Puree; Solid 2.  Material enters airway, remains ABOVE vocal cords then ejected out: Thin liquids (Level 0) Compensatory Strategies: Compensatory Strategies Compensatory strategies: Yes Straw: Effective Effective Straw: Thin liquid (Level 0) Liquid wash: Effective Effective Liquid Wash: Puree; Solid Other(comment): Ineffective Ineffective Other(comment): Thin liquid (Level 0); Mildly thick liquid (Level 2, nectar thick); Puree; Solid   General Information: Caregiver present: No  Diet Prior to this Study: NPO   No data recorded  Respiratory Status: WFL   Supplemental O2: None (Room air)   History of Recent Intubation: No  Behavior/Cognition: Alert; Doesn't follow directions; Other (Comment) Self-Feeding Abilities: Dependent for feeding  Baseline vocal quality/speech: Hypophonia/low volume Volitional Cough: Unable to elicit Volitional Swallow: Unable to elicit Exam Limitations: No limitations Goal Planning: Prognosis for improved oropharyngeal function: Fair Barriers to Reach Goals: Severity of deficits No data recorded Patient/Family Stated Goal: for pt to be able to eat Consulted and agree with results and recommendations: Nurse; Pt unable/family or caregiver not available Pain: Pain Assessment Pain Assessment: No/denies pain Breathing: 0 Negative Vocalization: 0 Facial Expression: 0 Body Language: 0 Consolability: 0 PAINAD Score: 0 End of Session: Start Time:SLP Start Time (ACUTE ONLY): 1420 Stop Time: SLP Stop Time (ACUTE ONLY): 1440 Time Calculation:SLP Time Calculation (min) (ACUTE ONLY): 20 min Charges: SLP Evaluations $ SLP Speech Visit: 1 Visit SLP Evaluations $BSS Swallow: 1 Procedure $MBS Swallow: 1 Procedure $Swallowing Treatment: 1 Procedure SLP visit diagnosis: SLP Visit Diagnosis: Dysphagia, oropharyngeal phase (R13.12) Past Medical History: Past Medical History: Diagnosis Date  Acquired diverticulum of distal esophagus with dysphagia 10/03/2018  AKI (acute kidney injury) (HCC) 08/31/2018  Anxiety   Bradycardia 02/16/2014  DDD (degenerative disc disease), lumbar   DJD (degenerative joint disease)   Esophageal stenosis 08/31/2018  Fibromyalgia   Hemorrhoid   Hypertension   Hypothyroidism   Osteopenia   Sigmoid diverticulosis   Thyroid nodule   PT DENIES Past Surgical History: Past Surgical History: Procedure Laterality Date  ABDOMINAL HYSTERECTOMY    COMPLETE  BALLOON DILATION N/A 09/01/2018  Procedure: BALLOON DILATION;  Surgeon: Kerin Salen, MD;  Location: [facility] ENDOSCOPY;  Service: Gastroenterology;  Laterality: N/A;  BIOPSY  09/01/2018  Procedure: BIOPSY;  Surgeon: Kerin Salen, MD;  Location: [facility] ENDOSCOPY;  Service: Gastroenterology;;  BIOPSY  01/26/2022  Procedure: BIOPSY;  Surgeon: Lemar Lofty., MD;  Location: [facility] ENDOSCOPY;   Service: Gastroenterology;;  BOTOX INJECTION  09/01/2018  Procedure: BOTOX INJECTION;  Surgeon: Marca Ancona,  Marcos Eke, MD;  Location: [facility] ENDOSCOPY;  Service: Gastroenterology;;  BREAST SURGERY    breast biopsy  CHOLECYSTECTOMY    ESOPHAGEAL MANOMETRY N/A 10/07/2018  Procedure: ESOPHAGEAL MANOMETRY (EM);  Surgeon: Kerin Salen, MD;  Location: [facility] ENDOSCOPY;  Service: Gastroenterology;  Laterality: N/A;  ESOPHAGOGASTRODUODENOSCOPY N/A 10/25/2018  Procedure: ESOPHAGOGASTRODUODENOSCOPY (EGD);  Surgeon: Karie Soda, MD;  Location: [facility] ORS;  Service: General;  Laterality: N/A;  ESOPHAGOGASTRODUODENOSCOPY N/A 01/26/2022  Procedure: ESOPHAGOGASTRODUODENOSCOPY (EGD);  Surgeon: Lemar Lofty., MD;  Location: [facility] ENDOSCOPY;  Service: Gastroenterology;  Laterality: N/A;  ESOPHAGOGASTRODUODENOSCOPY (EGD) WITH PROPOFOL N/A 09/01/2018  Procedure: ESOPHAGOGASTRODUODENOSCOPY (EGD) WITH PROPOFOL;  Surgeon: Kerin Salen, MD;  Location: [facility] ENDOSCOPY;  Service: Gastroenterology;  Laterality: N/A;  ESOPHAGOGASTRODUODENOSCOPY (EGD) WITH PROPOFOL N/A 12/30/2021  Procedure: ESOPHAGOGASTRODUODENOSCOPY (EGD) WITH PROPOFOL;  Surgeon: Willis Modena, MD;  Location: [facility] ENDOSCOPY;  Service: Gastroenterology;  Laterality: N/A;  EUS N/A 01/26/2022  Procedure: UPPER ENDOSCOPIC ULTRASOUND (EUS) RADIAL;  Surgeon: Lemar Lofty., MD;  Location: [facility] ENDOSCOPY;  Service: Gastroenterology;  Laterality: N/A;  FIDUCIAL MARKER PLACEMENT N/A 01/26/2022  Procedure: FIDUCIAL MARKER PLACEMENT;  Surgeon: Lemar Lofty., MD;  Location: [facility] ENDOSCOPY;  Service: Gastroenterology;  Laterality: N/A;  FINE NEEDLE ASPIRATION N/A 12/30/2021  Procedure: FINE NEEDLE ASPIRATION (FNA) LINEAR;  Surgeon: Willis Modena, MD;  Location: [facility] ENDOSCOPY;  Service: Gastroenterology;  Laterality: N/A;  FOREIGN BODY REMOVAL  01/26/2022  Procedure: FOREIGN BODY REMOVAL;  Surgeon: Meridee Score Netty Starring., MD;  Location: [facility] ENDOSCOPY;  Service: Gastroenterology;;  PACEMAKER IMPLANT  N/A 03/06/2019  Procedure: PACEMAKER IMPLANT;  Surgeon: Marinus Maw, MD;  Location: [facility] INVASIVE CV LAB;  Service: Cardiovascular;  Laterality: N/A;  UPPER ESOPHAGEAL ENDOSCOPIC ULTRASOUND (EUS) Bilateral 12/30/2021  Procedure: UPPER ESOPHAGEAL ENDOSCOPIC ULTRASOUND (EUS);  Surgeon: Willis Modena, MD;  Location: [facility] ENDOSCOPY;  Service: Gastroenterology;  Laterality: Bilateral; [name], MS [facility] SLP Acute Rehab Services Office [phone] Chales Abrahams 11/25/2022, 3:20 PM       Scheduled Meds:  amLODipine  10 mg Oral Daily   apixaban  2.5 mg Oral BID   atorvastatin  20 mg Oral Daily   docusate sodium  100 mg Oral BID   feeding supplement  237 mL Oral BID BM   losartan  100 mg Oral q AM   metoprolol succinate  50 mg Oral Daily   potassium chloride  40 mEq Oral Once   Continuous Infusions:     LOS: 3 days    Time spent: 45 minutes spent on chart review, discussion with nursing staff, consultants, updating family and interview/physical exam; more than 50% of that time was spent in counseling and/or coordination of care.    Joseph Art, DO [facility] Available via Epic secure chat 7am-7pm After these hours, please refer to coverage provider listed on [email or web address] 11/27/2022, 12:12 PM

## 2022-11-27 NOTE — TOC Initial Note (Signed)
Transition of Care [facility]) - Initial/Assessment Note    Patient Details  Name: Shannon Ortiz MRN: [id number] Date of Birth: 1935-09-07  Transition of Care [facility]) CM/SW Contact:    Larrie Kass, LCSW Phone Number: 11/27/2022, 9:12 AM  Clinical Narrative:                 CSW attempted to speak with pt's daughter [name] about SNF rec, no answer , unable to leave VM , VM full. TOC to follow.   Expected Discharge Plan:  (TBD) Barriers to Discharge: Continued Medical Work up   Patient Goals and CMS Choice            Expected Discharge Plan and Services       Living arrangements for the past 2 months: Single Family Home                                      Prior Living Arrangements/Services Living arrangements for the past 2 months: Single Family Home                     Activities of Daily Living Home Assistive Devices/Equipment: Cane (specify quad or straight) ADL Screening (condition at time of admission) Patient's cognitive ability adequate to safely complete daily activities?: Yes Is the patient deaf or have difficulty hearing?: No Does the patient have difficulty seeing, even when wearing glasses/contacts?: No Does the patient have difficulty concentrating, remembering, or making decisions?: No Patient able to express need for assistance with ADLs?: Yes Does the patient have difficulty dressing or bathing?: No Independently performs ADLs?: Yes (appropriate for developmental age) Does the patient have difficulty walking or climbing stairs?: Yes Weakness of Legs: Both Weakness of Arms/Hands: None  Permission Sought/Granted                  Emotional Assessment              Admission diagnosis:  Atrial fibrillation with rapid ventricular response (HCC) [I48.91] Generalized weakness [R53.1] Atrial fibrillation with RVR (HCC) [I48.91] AMS (altered mental status) [R41.82] Patient Active Problem List   Diagnosis Date Noted   AMS  (altered mental status) 11/24/2022   Atrial fibrillation with rapid ventricular response (HCC) 11/23/2022   Genetic testing 02/09/2022   Monoallelic mutation of LZTR1 gene 02/09/2022   Pancreatic cancer (HCC) 01/09/2022   Aortic atherosclerosis (HCC) 12/26/2021   Generalized abdominal pain 05/11/2021   Paroxysmal atrial fibrillation (HCC) 06/22/2019   Pacemaker 06/22/2019   Sinus node dysfunction (HCC) 03/06/2019   Weakness generalized 12/27/2018   Elevated troponin 12/26/2018   Hypomagnesemia 10/28/2018   Hiatal hernia s/p robotic repair 10/25/2018 10/25/2018   Status post robotic Toupet (posterior partial) fundoplication 10/25/2018 10/25/2018   Esophageal stricture 10/15/2018   Acquired diverticulum of distal esophagus s/p robotic excision & myotomy 10/25/2018 10/03/2018   Esophageal stenosis 08/31/2018   Hypokalemia 08/31/2018   AKI (acute kidney injury) (HCC) 08/31/2018   Bradycardia 02/16/2014   Near syncope 02/16/2014   HTN (hypertension) 02/16/2014   PCP:  Daisy Floro, MD Pharmacy:   [facility] [address] [place] - [address] [place] [address] Phone: [phone] Fax: [phone]  [id number] [place] [place] - [address] [place] [address] Phone: [phone] Fax: [phone]  [facility] [address]  [place] [address] Phone: [phone] Fax: [phone]  [id number] [place], [place] - [address] [place] [address] Phone: [phone] Fax: [phone]     Social Determinants of Health (SDOH) Social History: SDOH Screenings   Food Insecurity: No Food Insecurity (11/23/2022)  Housing: Patient Declined (11/23/2022)  Transportation Needs: No Transportation Needs (11/23/2022)  Utilities: Not At Risk (11/23/2022)  Tobacco Use: Low Risk  (11/24/2022)   SDOH Interventions:     Readmission Risk  Interventions     No data to display

## 2022-11-27 NOTE — Progress Notes (Signed)
Inpatient Rehab Admissions Coordinator:   Per OT recommendations pt was screened for CIR by Estill Dooms, PT, DPT.  Note PT recommending SNF, but pt with acute CVA.  Will place an order for CIR assessment per our protocol and an [facility] will follow up.   Estill Dooms, PT, DPT Admissions Coordinator [phone] 11/27/22  3:49 PM

## 2022-11-27 NOTE — Progress Notes (Signed)
Physical Therapy Treatment Patient Details Name: Shannon Ortiz MRN: [id number] DOB: 04/11/1936 Today's Date: 11/27/2022   History of Present Illness 87 y.o. female with PMH of sinus node dysfunction s/p PPM, HTN, atrial fibrillation on Eliquis, thyroid and pancreatic CA who was seen for afib with RVR and elevated troponin. 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    PT Comments    Pt in bed with Nephew at bedside.  "She has not talked much since the stroke", stated Nephew.  Assisted OOB to amb was difficult and required + 2 assist.  General bed mobility comments: cues to self assist, incr time, assist to elevate trunk and complete scooting to EOB using bed pad.  General transfer comment: required Max Asisst + 2 to rise as well as asissted with a toilet transfer.  VERY unstable.  pt did not functionally use hands to steady self and balance VERY impaired with little to no self correction.  Near fall off toilet.  Severe right lean.  + 2 assist to adjust.  General Gait Details: VERY unstable gait required + 2 assist.  Poor balnce.  Poor stepping.  Severe RIGHT lean.  Poor hand grip on Buckel.  VERY limited distance to and from bathroom only. Poor posture.   HIGH FALL RISK.  Assisted back to bed and positioned to comfort.   Pt will need ST Rehab at SNF to address mobility and functional decline prior to safely returning home.   Recommendations for follow up therapy are one component of a multi-disciplinary discharge planning process, led by the attending physician.  Recommendations may be updated based on patient status, additional functional criteria and insurance authorization.  Follow Up Recommendations  Can patient physically be transported by private vehicle: No    Assistance Recommended at Discharge Intermittent Supervision/Assistance  Patient can return home with the following A lot of help with walking and/or transfers;A lot of help with  bathing/dressing/bathroom;Two people to help with walking and/or transfers;Two people to help with bathing/dressing/bathroom   Equipment Recommendations  None recommended by PT    Recommendations for Other Services       Precautions / Restrictions Precautions Precautions: Fall Precaution Comments: new CVA Restrictions Weight Bearing Restrictions: No     Mobility  Bed Mobility Overal bed mobility: Needs Assistance Bed Mobility: Supine to Sit, Sit to Supine     Supine to sit: Min assist, Mod assist     General bed mobility comments: cues to self assist, incr time, assist to elevate trunk and complete scooting to EOB using bed pad.    Transfers Overall transfer level: Needs assistance Equipment used: Rolling Beth (2 wheels) Transfers: Sit to/from Stand, Bed to chair/wheelchair/BSC Sit to Stand: Max assist, +2 physical assistance, +2 safety/equipment           General transfer comment: required Max Asisst + 2 to rise as well as asissted with a toilet transfer.  VERY unstable.  pt did not functionally use hands to steady self and balance VERY impaired with little to no self correction.  Near fall off toilet.  Severe right lean.  + 2 assist to adjust.    Ambulation/Gait Ambulation/Gait assistance: Max assist, +2 physical assistance, +2 safety/equipment Gait Distance (Feet): 16 Feet (8 feet x 2 to and from bathroom) Assistive device: Rolling Bensinger (2 wheels) Gait Pattern/deviations: Step-to pattern Gait velocity: decreased     General Gait Details: VERY unstable gait required + 2 assist.  Poor balnce.  Poor  stepping.  Severe RIGHT lean.  Poor hand grip on Schepers.  VERY limited distance to and from bathroom only. Poor posture.   HIGH FALL RISK.   Stairs             Wheelchair Mobility    Modified Rankin (Stroke Patients Only)       Balance                                            Cognition Arousal/Alertness: Awake/alert Behavior  During Therapy: WFL for tasks assessed/performed Overall Cognitive Status: Difficult to assess Area of Impairment: Problem solving, Following commands                       Following Commands: Follows one step commands consistently     Problem Solving: Slow processing, Decreased initiation, Difficulty sequencing, Requires verbal cues, Requires tactile cues General Comments: unable to assess d/t language deficits; pt follows one step commands consistenly, laughs at staff's jokes appropriate but with significant expressive deficits and possible receptive deficits.        Exercises      General Comments        Pertinent Vitals/Pain Pain Assessment Pain Assessment: No/denies pain    Home Living                          Prior Function            PT Goals (current goals can now be found in the care plan section) Progress towards PT goals: Progressing toward goals    Frequency    Min 1X/week      PT Plan Current plan remains appropriate    Co-evaluation              AM-PAC PT "6 Clicks" Mobility   Outcome Measure  Help needed turning from your back to your side while in a flat bed without using bedrails?: A Lot Help needed moving from lying on your back to sitting on the side of a flat bed without using bedrails?: A Lot Help needed moving to and from a bed to a chair (including a wheelchair)?: A Lot Help needed standing up from a chair using your arms (e.g., wheelchair or bedside chair)?: A Lot Help needed to walk in hospital room?: A Lot Help needed climbing 3-5 steps with a railing? : Total 6 Click Score: 11    End of Session Equipment Utilized During Treatment: Gait belt Activity Tolerance: Patient limited by fatigue;Other (comment) (new CVA) Patient left: in bed;with call bell/phone within reach;with bed alarm set;with family/visitor present Nurse Communication: Mobility status PT Visit Diagnosis: Other abnormalities of gait and  mobility (R26.89);Difficulty in walking, not elsewhere classified (R26.2)     Time: 4401-0272 PT Time Calculation (min) (ACUTE ONLY): 32 min  Charges:  $Gait Training: 8-22 mins $Therapeutic Activity: 8-22 mins                     Felecia Shelling  PTA Acute  Rehabilitation Services Office M-F          [phone]

## 2022-11-28 DIAGNOSIS — I4891 Unspecified atrial fibrillation: Secondary | ICD-10-CM | POA: Diagnosis not present

## 2022-11-28 MED ORDER — POTASSIUM CHLORIDE CRYS ER 20 MEQ PO TBCR
40.0000 meq | EXTENDED_RELEASE_TABLET | Freq: Once | ORAL | Status: AC
  Administered 2022-11-28: 40 meq via ORAL
  Filled 2022-11-28: qty 2

## 2022-11-28 MED ORDER — ALPRAZOLAM 0.5 MG PO TABS: 0.5000 mg | ORAL_TABLET | Freq: Three times a day (TID) | ORAL | Status: DC | PRN

## 2022-11-28 NOTE — Progress Notes (Signed)
Daily Progress Note   Patient Name: Shannon Ortiz       Date: 11/28/2022 DOB: 1936-03-22  Age: 87 y.o. MRN#: [id number] Attending Physician: Shannon Lloyd, MD Primary Care Physician: Shannon Floro, MD Admit Date: 11/23/2022  Reason for Consultation/Follow-up: Establishing goals of care  Subjective:  Awake alert,  follows commands, aphasia.  Length of Stay: 4  Current Medications: Scheduled Meds:   amLODipine  10 mg Oral Daily   apixaban  2.5 mg Oral BID   atorvastatin  20 mg Oral Daily   docusate sodium  100 mg Oral BID   feeding supplement  237 mL Oral BID BM   losartan  100 mg Oral q AM   metoprolol succinate  50 mg Oral Daily    Continuous Infusions:   PRN Meds: acetaminophen **OR** acetaminophen, albuterol, ondansetron **OR** ondansetron (ZOFRAN) IV, polyethylene glycol, traZODone  Physical Exam         Appears with generalized weakness Diminished breath sounds bases No edema Awake alert follows commands, aphasia since stroke In no distress  Vital Signs: BP (!) 157/80   Pulse 64   Temp 98.5 F (36.9 C) (Oral)   Resp 19   Ht 5\' 6"  (1.676 m)   Wt 55.2 kg   SpO2 99%   BMI 19.64 kg/m  SpO2: SpO2: 99 % O2 Device: O2 Device: Room Air O2 Flow Rate:    Intake/output summary:  Intake/Output Summary (Last 24 hours) at 11/28/2022 1420 Last data filed at 11/28/2022 1138 Gross per 24 hour  Intake 360 ml  Output 950 ml  Net -590 ml   LBM: Last BM Date : 11/26/22 Baseline Weight: Weight: 54.9 kg Most recent weight: Weight: 55.2 kg       Palliative Assessment/Data:      Patient Active Problem List   Diagnosis Date Noted   AMS (altered mental status) 11/24/2022   Atrial fibrillation with rapid ventricular response (HCC) 11/23/2022   Genetic testing  02/09/2022   Monoallelic mutation of LZTR1 gene 02/09/2022   Pancreatic cancer (HCC) 01/09/2022   Aortic atherosclerosis (HCC) 12/26/2021   Generalized abdominal pain 05/11/2021   Paroxysmal atrial fibrillation (HCC) 06/22/2019   Pacemaker 06/22/2019   Sinus node dysfunction (HCC) 03/06/2019   Weakness generalized 12/27/2018   Elevated troponin 12/26/2018   Hypomagnesemia 10/28/2018   Hiatal  hernia s/p robotic repair 10/25/2018 10/25/2018   Status post robotic Toupet (posterior partial) fundoplication 10/25/2018 10/25/2018   Esophageal stricture 10/15/2018   Acquired diverticulum of distal esophagus s/p robotic excision & myotomy 10/25/2018 10/03/2018   Esophageal stenosis 08/31/2018   Hypokalemia 08/31/2018   AKI (acute kidney injury) (HCC) 08/31/2018   Bradycardia 02/16/2014   Near syncope 02/16/2014   HTN (hypertension) 02/16/2014    Palliative Care Assessment & Plan   Patient Profile:    Assessment:  87 year old lady who lives at home with her daughter.  Past medical history of sinus node dysfunction status post pacemaker, history of hypertension atrial fibrillation is on Eliquis, pancreatic cancer undergoes surveillance, admitted with generalized weakness rapid atrial fibrillation.  Found to have CVA on MRI.  SLP following. MRI with scattered subcentimeter acute ischemic nonhemorrhagic infarcts involving bilateral cerebral and cerebellar hemispheres in watershed distribution-hypoperfusion injury.  SLP following and patient noted to have severe swallowing issues.  PT OT also consulted. Palliative consult for ongoing goals of care discussions has been requested.  Recommendations/Plan: Call placed and goals of care discussions held with daughter Shannon Ortiz.  She states that she is the patient's healthcare power of attorney agent, patient lives with her daughter Shannon Ortiz.  Shannon Ortiz stated that she had some discussions with the [facility] inpatient rehab on the phone.  Overall, she is in favor of  skilled nursing facility rehabilitation trial insert Oxy IR attempt.  She asked that the patient be given low-dose anxiety medication to be used on an as needed basis.  She states that that the recent aphasia from the stroke has left the patient feeling frustrated and anxious at times.  Plan appears to be for skilled nursing facility rehabilitation attempt.  For now, daughter wishes to continue efforts with the current diet of full liquids and continue to monitor.  Goals of Care and Additional Recommendations: Limitations on Scope of Treatment: Full Scope Treatment  Code Status:    Code Status Orders  (From admission, onward)           Start     Ordered   11/23/22 1116  Full code  Continuous       Question:  By:  Answer:  Consent: discussion documented in EHR   11/23/22 1116           Code Status History     Date Active Date Inactive Code Status Order ID Comments User Context   03/06/2019 1650 03/07/2019 1431 Full Code [id number]  Marinus Maw, MD Inpatient   12/27/2018 0223 12/27/2018 1941 Full Code [id number]  Eduard Clos, MD Inpatient   10/25/2018 1329 10/28/2018 1723 Full Code [id number]  Karie Soda, MD Inpatient   10/15/2018 1609 10/17/2018 1740 Full Code [id number]  Jonah Blue, MD ED   08/31/2018 1834 09/02/2018 1326 Full Code [id number]  Charlsie Quest, MD ED   02/16/2014 2225 02/17/2014 1959 Full Code [id number]  Eduard Clos, MD Inpatient       Prognosis:  Unable to determine  Discharge Planning: Skilled Nursing Facility for rehab with Palliative care service follow-up  Care plan was discussed with daughter Shannon Ortiz on the phone [phone].  Thank you for allowing the Palliative Medicine Team to assist in the care of this patient. Mod MDM.      Greater than 50%  of this time was spent counseling and coordinating care related to the above assessment and plan.  Shannon Hawking, MD  Please contact Palliative Medicine Team phone at [phone] for questions  and  concerns.

## 2022-11-28 NOTE — Progress Notes (Addendum)
PROGRESS NOTE    Shannon Ortiz  NFA:[id number] DOB: 06/05/1936 DOA: 11/23/2022 PCP: Daisy Floro, MD   Brief Narrative:  87 y.o. female with medical history significant for sinus node dysfunction with pacemaker, hypertension, atrial fibrillation on Eliquis and pancreatic cancer undergoing surveillance was admitted for nonspecific weakness and altered mental status and was found to be in rapid A-fib.  CT head was unremarkable on presentation.  She received IV metoprolol in the ED and converted back to normal sinus rhythm.  She had significantly elevated troponin, cardiology was consulted.  MRI of the brain showed acute CVA.  Neurology was consulted.  Palliative care also consulted for goals of care discussion.  PT recommended SNF/CIR placement.  Assessment & Plan:   Acute CVA Dysphagia: Most likely neurogenic from acute CVA -MRI showed multiple scattered bilateral cerebral and cerebellar hemisphere ischemic infarcts -Echo showed EF of 45 to 50%.  Carotid Doppler negative. -Neurology evaluation appreciated.  Currently on Eliquis -LDL 102.  On Lipitor 20 mg daily as per neurology.  A1c 4.9. -Diet as per SLP recommendations.  Oral intake is not appropriate yet. -PT/OT recommending SNF/CIR placement.  TOC in CIR following.  Paroxysmal A-fib with RVR -Currently rate controlled.  Converted to normal sinus rhythm after IV fluids and metoprolol.  Continue Eliquis and metoprolol succinate.  Cardiology following  Cardiomyopathy -Echo showed EF of 45 to 50% during this hospitalization.  Previous echo in July 2020 showed EF of 55 to 60%.  Strict input and output.  Daily weights.  Continue metoprolol succinate and losartan.  Cardiology following.  No signs of volume overload at this time.  Elevated troponins -Likely related to tachycardia.  Troponins trended to 454.  No chest pain.  No further intervention needed as per cardiology.  History of pacemaker placement for sinus node  dysfunction -Cardiology following.  Hypertension -Monitor blood pressure.  Continue amlodipine, losartan and metoprolol succinate.  Monitor for permissive hypertension as per neurology.  Microcytic anemia -Questionable cause.  Hemoglobin stable.  Monitor intermittently  Thrombocytopenia -Resolved  Leukocytosis -Resolved  Hyponatremia -Mild.  No labs today.  Monitor intermittently  Hypokalemia -no labs today.  Repeat a.m. labs  Goals of care -Overall prognosis is guarded to poor because of recent CVA and dysphagia and inability to speak with poor oral intake.  Palliative care following.  Remains full code.   DVT prophylaxis: Eliquis Code Status: Full Family Communication: Called son/Marshall on phone, he didn't pick up Disposition Plan: Status is: Inpatient Remains inpatient appropriate because: Of severity of illness.  Need for SNF/CIR placement    Consultants: Cardiology/neurology/palliative care  Procedures: Echo  Antimicrobials: None   Subjective: Patient seen and examined at bedside.  Follows some commands.  Speech still impaired.  No fever, vomiting, agitation reported.  Poor historian.  Objective: Vitals:   11/27/22 2036 11/28/22 0300 11/28/22 0428 11/28/22 0913  BP: (!) 160/79 (!) 144/74 (!) 154/91 (!) 175/85  Pulse: 63 68 62 66  Resp: 13 15 16    Temp: 97.8 F (36.6 C) 97.8 F (36.6 C) 98 F (36.7 C)   TempSrc: Oral Oral Oral   SpO2: 100% 96% 100%   Weight:      Height:        Intake/Output Summary (Last 24 hours) at 11/28/2022 1059 Last data filed at 11/28/2022 0424 Gross per 24 hour  Intake 120 ml  Output 450 ml  Net -330 ml   Filed Weights   11/23/22 0809 11/27/22 0530  Weight: 54.9 kg 55.2 kg  Examination:  General exam: Appears calm and comfortable.  Looks chronically ill and deconditioned.  On room air.  Elderly female lying in bed. Respiratory system: Bilateral decreased breath sounds at bases with some scattered  crackles Cardiovascular system: S1 & S2 heard, Rate controlled Gastrointestinal system: Abdomen is nondistended, soft and nontender. Normal bowel sounds heard. Extremities: No cyanosis, clubbing; trace lower extremity edema Central nervous system: Awake, follows some commands but has hard time speaking. Skin: No rashes, lesions or ulcers Psychiatry: Not agitated.  Intermittently smiling.    Data Reviewed: I have personally reviewed following labs and imaging studies  CBC: Recent Labs  Lab 11/23/22 0839 11/24/22 0424 11/26/22 0443 11/27/22 0415  WBC 13.6* 8.6 4.1 4.3  HGB 10.8* 9.0* 9.2* 9.4*  HCT 33.1* 28.0* 28.1* 27.8*  MCV 72.6* 75.7* 73.0* 71.1*  PLT 131* 102* 146* 171   Basic Metabolic Panel: Recent Labs  Lab 11/23/22 0839 11/24/22 0424 11/26/22 0441 11/26/22 0443 11/27/22 0415  NA 133* 132*  --  131* 133*  K 3.3* 3.7  --  3.3* 3.4*  CL 98 102  --  101 102  CO2 24 23  --  21* 23  GLUCOSE 136* 97  --  89 95  BUN 20 17  --  10 9  CREATININE 1.00 0.76  --  0.63 0.62  CALCIUM 9.6 8.8*  --  8.5* 8.9  MG  --   --  1.9  --   --    GFR: Estimated Creatinine Clearance: 45.6 mL/min (by C-G formula based on SCr of 0.62 mg/dL). Liver Function Tests: Recent Labs  Lab 11/23/22 0839  AST 48*  ALT 21  ALKPHOS 167*  BILITOT 1.0  PROT 7.9  ALBUMIN 3.6   No results for input(s): "LIPASE", "AMYLASE" in the last 168 hours. No results for input(s): "AMMONIA" in the last 168 hours. Coagulation Profile: No results for input(s): "INR", "PROTIME" in the last 168 hours. Cardiac Enzymes: No results for input(s): "CKTOTAL", "CKMB", "CKMBINDEX", "TROPONINI" in the last 168 hours. BNP (last 3 results) No results for input(s): "PROBNP" in the last 8760 hours. HbA1C: No results for input(s): "HGBA1C" in the last 72 hours. CBG: Recent Labs  Lab 11/23/22 0830  GLUCAP 127*   Lipid Profile: No results for input(s): "CHOL", "HDL", "LDLCALC", "TRIG", "CHOLHDL", "LDLDIRECT" in  the last 72 hours. Thyroid Function Tests: No results for input(s): "TSH", "T4TOTAL", "FREET4", "T3FREE", "THYROIDAB" in the last 72 hours. Anemia Panel: No results for input(s): "VITAMINB12", "FOLATE", "FERRITIN", "TIBC", "IRON", "RETICCTPCT" in the last 72 hours. Sepsis Labs: No results for input(s): "PROCALCITON", "LATICACIDVEN" in the last 168 hours.  Recent Results (from the past 240 hour(s))  SARS Coronavirus 2 by RT PCR (hospital order, performed in [facility] hospital lab) *cepheid single result test* Anterior Nasal Swab     Status: None   Collection Time: 11/23/22  9:04 AM   Specimen: Anterior Nasal Swab  Result Value Ref Range Status   SARS Coronavirus 2 by RT PCR NEGATIVE NEGATIVE Final    Comment: (NOTE) SARS-CoV-2 target nucleic acids are NOT DETECTED.  The SARS-CoV-2 RNA is generally detectable in upper and lower respiratory specimens during the acute phase of infection. The lowest concentration of SARS-CoV-2 viral copies this assay can detect is 250 copies / mL. A negative result does not preclude SARS-CoV-2 infection and should not be used as the sole basis for treatment or other patient management decisions.  A negative result may occur with improper specimen collection / handling,  submission of specimen other than nasopharyngeal swab, presence of viral mutation(s) within the areas targeted by this assay, and inadequate number of viral copies (<250 copies / mL). A negative result must be combined with clinical observations, patient history, and epidemiological information.  Fact Sheet for Patients:   RoadLapTop.co.za  Fact Sheet for Healthcare Providers: http://kim-miller.com/  This test is not yet approved or  cleared by the [place] FDA and has been authorized for detection and/or diagnosis of SARS-CoV-2 by FDA under an Emergency Use Authorization (EUA).  This EUA will remain in effect (meaning this test can be  used) for the duration of the COVID-19 declaration under Section 564(b)(1) of the Act, 21 U.S.C. section 360bbb-3(b)(1), unless the authorization is terminated or revoked sooner.  Performed at [facility], [address] [place], [place] [address]          Radiology Studies: No results found.      Scheduled Meds:  amLODipine  10 mg Oral Daily   apixaban  2.5 mg Oral BID   atorvastatin  20 mg Oral Daily   docusate sodium  100 mg Oral BID   feeding supplement  237 mL Oral BID BM   losartan  100 mg Oral q AM   metoprolol succinate  50 mg Oral Daily   Continuous Infusions:        Glade Lloyd, MD [facility] 11/28/2022, 10:59 AM

## 2022-11-28 NOTE — Progress Notes (Signed)
Inpatient Rehab Admissions:  Inpatient Rehab Consult received.  I spoke with patient's daughter [name] on the telephone for rehabilitation assessment and to discuss goals and expectations of an inpatient rehab admission.  Discussed average length of stay, insurance authorization requirement, discharge home after completion of CIR. Also discussed SNF. She acknowledged understanding. She prefers a SNF rehab for pt. TOC made aware. AC will sign off.  Signed: Wolfgang [place], MS, CCC-SLP Admissions Coordinator [phone]

## 2022-11-28 NOTE — TOC Progression Note (Signed)
Transition of Care [facility]) - Progression Note    Patient Details  Name: Cedrika Vaynshteyn MRN: [id number] Date of Birth: 1935-07-20  Transition of Care [facility] CM/SW Contact  Otelia Santee, LCSW Phone Number: 11/28/2022, 1:56 PM  Clinical Narrative:    CSW attempted to reach pt's daughter to review bed offers. No answer and unable to leave VM due to mailbox being full.    Expected Discharge Plan: Skilled Nursing Facility Barriers to Discharge: Continued Medical Work up  Expected Discharge Plan and Services       Living arrangements for the past 2 months: Single Family Home                                       Social Determinants of Health (SDOH) Interventions SDOH Screenings   Food Insecurity: No Food Insecurity (11/23/2022)  Housing: Patient Declined (11/23/2022)  Transportation Needs: No Transportation Needs (11/23/2022)  Utilities: Not At Risk (11/23/2022)  Tobacco Use: Low Risk  (11/24/2022)    Readmission Risk Interventions     No data to display

## 2022-11-28 NOTE — Plan of Care (Signed)
  Problem: Coping: Goal: Level of anxiety will decrease Outcome: Progressing   Problem: Elimination: Goal: Will not experience complications related to urinary retention Outcome: Progressing   Problem: Pain Managment: Goal: General experience of comfort will improve Outcome: Progressing   Problem: Safety: Goal: Ability to remain free from injury will improve Outcome: Progressing   Problem: Skin Integrity: Goal: Risk for impaired skin integrity will decrease Outcome: Progressing   Problem: Ischemic Stroke/TIA Tissue Perfusion: Goal: Complications of ischemic stroke/TIA will be minimized Outcome: Progressing   Problem: Health Behavior/Discharge Planning: Goal: Ability to manage health-related needs will improve Outcome: Progressing   Problem: Nutrition: Goal: Risk of aspiration will decrease Outcome: Progressing

## 2022-11-28 NOTE — Progress Notes (Signed)
Rounding Note    Patient Name: Shannon Ortiz Date of Encounter: 11/28/2022  [facility] Cardiologist: Charlton Haws, MD   Subjective   Post CVA aphasic   Inpatient Medications    Scheduled Meds:  amLODipine  10 mg Oral Daily   apixaban  2.5 mg Oral BID   atorvastatin  20 mg Oral Daily   docusate sodium  100 mg Oral BID   feeding supplement  237 mL Oral BID BM   losartan  100 mg Oral q AM   metoprolol succinate  50 mg Oral Daily   potassium chloride  40 mEq Oral Once   Continuous Infusions:   PRN Meds: acetaminophen **OR** acetaminophen, albuterol, ondansetron **OR** ondansetron (ZOFRAN) IV, polyethylene glycol, traZODone   Vital Signs    Vitals:   11/27/22 1300 11/27/22 2036 11/28/22 0300 11/28/22 0428  BP: (!) 144/81 (!) 160/79 (!) 144/74 (!) 154/91  Pulse: 61 63 68 62  Resp: 20 13 15 16   Temp: 98.4 F (36.9 C) 97.8 F (36.6 C) 97.8 F (36.6 C) 98 F (36.7 C)  TempSrc: Oral Oral Oral Oral  SpO2: 98% 100% 96% 100%  Weight:      Height:        Intake/Output Summary (Last 24 hours) at 11/28/2022 0811 Last data filed at 11/28/2022 0424 Gross per 24 hour  Intake 340 ml  Output 450 ml  Net -110 ml      11/27/2022    5:30 AM 11/23/2022    8:09 AM 05/15/2022   10:50 AM  Last 3 Weights  Weight (lbs) 121 lb 11.1 oz 121 lb 120 lb 4.8 oz  Weight (kg) 55.2 kg 54.885 kg 54.568 kg      Telemetry    AV pacing   ECG    NSR without significant ST-T wave changes - Personally Reviewed  Physical Exam   GEN: No acute distress.  Only answer yes or no Neck: No JVD Cardiac: RRR, no murmurs, rubs, or gallops.  Respiratory: Clear to auscultation bilaterally. GI: Soft, nontender, non-distended  MS: No edema; No deformity. Neuro:  weak Psych: flat affect   Labs    High Sensitivity Troponin:   Recent Labs  Lab 11/23/22 0839 11/23/22 1140 11/23/22 1309 11/24/22 1046 11/24/22 1407  TROPONINIHS 158* 250* 330* 454* 442*     Chemistry Recent Labs   Lab 11/23/22 0839 11/24/22 0424 11/26/22 0441 11/26/22 0443 11/27/22 0415  NA 133* 132*  --  131* 133*  K 3.3* 3.7  --  3.3* 3.4*  CL 98 102  --  101 102  CO2 24 23  --  21* 23  GLUCOSE 136* 97  --  89 95  BUN 20 17  --  10 9  CREATININE 1.00 0.76  --  0.63 0.62  CALCIUM 9.6 8.8*  --  8.5* 8.9  MG  --   --  1.9  --   --   PROT 7.9  --   --   --   --   ALBUMIN 3.6  --   --   --   --   AST 48*  --   --   --   --   ALT 21  --   --   --   --   ALKPHOS 167*  --   --   --   --   BILITOT 1.0  --   --   --   --   GFRNONAA 56* >60  --  >  60 >60  ANIONGAP 11 7  --  9 8    Lipids  Recent Labs  Lab 11/25/22 0827  CHOL 194  TRIG 80  HDL 76  LDLCALC 102*  CHOLHDL 2.6    Hematology Recent Labs  Lab 11/24/22 0424 11/26/22 0443 11/27/22 0415  WBC 8.6 4.1 4.3  RBC 3.70* 3.85* 3.91  HGB 9.0* 9.2* 9.4*  HCT 28.0* 28.1* 27.8*  MCV 75.7* 73.0* 71.1*  MCH 24.3* 23.9* 24.0*  MCHC 32.1 32.7 33.8  RDW 16.8* 15.9* 15.4  PLT 102* 146* 171   Thyroid No results for input(s): "TSH", "FREET4" in the last 168 hours.  BNPNo results for input(s): "BNP", "PROBNP" in the last 168 hours.  DDimer No results for input(s): "DDIMER" in the last 168 hours.   Radiology    No results found.  Cardiac Studies   ECHOCARDIOGRAM COMPLETE 11/24/2022   IMPRESSIONS     1. Left ventricular ejection fraction, by estimation, is 45 to 50%. The left ventricle has mildly decreased function. The left ventricle has no regional wall motion abnormalities. There is mild concentric left ventricular hypertrophy. Left ventricular diastolic parameters are consistent with Grade II diastolic dysfunction (pseudonormalization). 2. Right ventricular systolic function is normal. The right ventricular size is normal. There is moderately elevated pulmonary artery systolic pressure. The estimated right ventricular systolic pressure is 52.2 mmHg. 3. Left atrial size was severely dilated. 4. Right atrial size was moderately  dilated. 5. The mitral valve is grossly normal. Mild mitral valve regurgitation. No evidence of mitral stenosis. There is mild holosystolic prolapse of the middle scallop of the posterior leaflet of the mitral valve. 6. Tricuspid valve regurgitation is mild to moderate. 7. Focal calcification of the non coronary cusp . The aortic valve is tricuspid. Aortic valve regurgitation is not visualized. Aortic valve sclerosis/calcification is present, without any evidence of aortic stenosis. 8. The inferior vena cava is dilated in size with <50% respiratory variability, suggesting right atrial pressure of 15 mmHg.   Comparison(s): Changes from prior study are noted. The left ventricular function is worsened. LVEF now 45-50%. Mild MR. Mild to moderate TR. RVSP 52 mmHG.    Patient Profile     87 y.o. female with PMH of sinus node dysfunction s/p PPM, HTN, atrial fibrillation on Eliquis, pancreatic CA who was seen for afib with RVR and elevated troponin. Note, patient was admitted with weakness and AMS, MRI showed acute CVA in watershed distribution  Assessment & Plan    PAF - known history of PAF with low burden on previous device interrogation - arrived during this admission with afib with RVR. Converted to NSR on IVF and metoprolol.  - now on low dose eliquis 2.5 mg bid  Elevated troponin: likely related to tachycardia, trop trended up to 454.   Cardiomyopathy: EF 45-50% on echo 11/24/2022. Previous Echo in July 2020 showed EF 55-60%. No ARNI/ARB with ACEI allergy.   St Jude Pacemaker: placed on 03/06/2019. Last interrogation 10/30/2022 showed 1.6% afib burden. Now AV pacing in sinus   AMS with acute CVA: scattered subcentimeter acute ischemic nonhemorrhagic infarcts involving the watershed distribution, favors hypoperfusion injury. CTA of head however suggest subocclusive thrombus in distal L M2/M3 junction.    For questions or updates, please contact [facility] Please consult  [email or web address] for contact info under     Signed, Charlton Haws, MD  11/28/2022, 8:11 AM

## 2022-11-29 DIAGNOSIS — I4891 Unspecified atrial fibrillation: Secondary | ICD-10-CM | POA: Diagnosis not present

## 2022-11-29 DIAGNOSIS — Z515 Encounter for palliative care: Secondary | ICD-10-CM

## 2022-11-29 DIAGNOSIS — R531 Weakness: Secondary | ICD-10-CM

## 2022-11-29 DIAGNOSIS — Z7189 Other specified counseling: Secondary | ICD-10-CM

## 2022-11-29 LAB — BASIC METABOLIC PANEL
Anion gap: 7 (ref 5–15)
BUN: 9 mg/dL (ref 8–23)
CO2: 25 mmol/L (ref 22–32)
Calcium: 9.3 mg/dL (ref 8.9–10.3)
Chloride: 101 mmol/L (ref 98–111)
Creatinine, Ser: 0.74 mg/dL (ref 0.44–1.00)
GFR, Estimated: >60 mL/min (ref >60)
Glucose, Bld: 89 mg/dL (ref 70–99)
Potassium: 3.8 mmol/L (ref 3.5–5.1)
Sodium: 133 mmol/L — ABNORMAL LOW (ref 135–145)

## 2022-11-29 LAB — MAGNESIUM: Magnesium: 1.9 mg/dL (ref 1.7–2.4)

## 2022-11-29 NOTE — Progress Notes (Signed)
STROKE TEAM PROGRESS NOTE   INTERVAL HISTORY:  Patient seen and evaluated this morning, no acute overnight events noted. Her daughter is at the bedside.  She remains aphasic even though daughter mentioned that stated her name yesterday. Palliative care is following, once stable, plan for discharged to skilled nursing facility.   Vitals:   11/28/22 1700 11/28/22 2100 11/29/22 0600 11/29/22 0932  BP: (!) 153/93 120/74 (!) 147/80 (!) 144/81  Pulse:  67  80  Resp: 16 18 16 16   Temp:  98.2 F (36.8 C)  99 F (37.2 C)  TempSrc:  Oral  Oral  SpO2: 98% 99%  98%  Weight:      Height:       CBC:  Recent Labs  Lab 11/26/22 0443 11/27/22 0415  WBC 4.1 4.3  HGB 9.2* 9.4*  HCT 28.1* 27.8*  MCV 73.0* 71.1*  PLT 146* 171   Basic Metabolic Panel:  Recent Labs  Lab 11/26/22 0441 11/26/22 0443 11/27/22 0415 11/29/22 0433  NA  --    < > 133* 133*  K  --    < > 3.4* 3.8  CL  --    < > 102 101  CO2  --    < > 23 25  GLUCOSE  --    < > 95 89  BUN  --    < > 9 9  CREATININE  --    < > 0.62 0.74  CALCIUM  --    < > 8.9 9.3  MG 1.9  --   --  1.9   < > = values in this interval not displayed.   Lipid Panel:  Recent Labs  Lab 11/25/22 0827  CHOL 194  TRIG 80  HDL 76  CHOLHDL 2.6  VLDL 16  LDLCALC 454*   HgbA1c:  Recent Labs  Lab 11/25/22 0827  HGBA1C 4.9   Urine Drug Screen: No results for input(s): "LABOPIA", "COCAINSCRNUR", "LABBENZ", "AMPHETMU", "THCU", "LABBARB" in the last 168 hours.  Alcohol Level No results for input(s): "ETH" in the last 168 hours.  IMAGING past 24 hours No results found.  PHYSICAL EXAM Sleepy but easily arousable. She is globally aphasic, not able to name, repeat of follow commands. However, she is able to mimics. She is able to track examiner. Able to move all 4 extremities at least antigravity, will react to stimulus.   ASSESSMENT/PLAN Ms. Ahnika Heaphy is a 87 y.o. female with history of sinus node dysfunction, hypertension, hyperlipidemia,  atrial fibrillation on eliquis, pancreatic caner  presenting with generalized weakness and word finding difficulty found to be in Afib with multiples scattered subcentimeter cerebral and cerebellar hemispheric strokes. Stroke etiology likely cardioembolic from Afib or hypercoagulable state from malignancy. Discussed stroke findings, risk factors and likely etiology with daughter. Spent additional time going over images and answering all other questions. Continue with anticoagulation, continue with statin, continue with goal of care discussion. Neurology will sign off, please do not hesitate to contact us for any additional questions or concerns.   Stroke Multiple subcentimeter nonhemorrhagic infarct involving the bilateral cerebral and cerebellar hemispheres.   Etiology:  Likely cardioembolic from atrial fibrillation and/or hypercoagulable state from malignancy  Code Stroke CT head No acute abnormality. Mild cerebral atrophy Small vessel disease. CTA head & neck Focal filling defect at a distal left M2/M3 junction, consistent with subocclusive thrombus. No other large vessel occlusion. She was on heprarin and switched back to Eliquis.  MRI  multiples scattered subcentimeter cerebral and  cerebellar hemispheric strokes 2D Echo EF 40-45, LVH LDL 102 HgbA1c 4.9 VTE prophylaxis - Full anticoagulation Eliquis    Diet   Diet full liquid Room service appropriate? Yes; Fluid consistency: Thin   Eliquis (apixaban) daily prior to admission, now on Eliquis (apixaban) daily.  Therapy recommendations:    Disposition:  Skilled Nursing facility   Hypertension Home meds:  Amlodipine, Losartan Stable Permissive hypertension (OK if < 220/120) but gradually normalize in 5-7 days Long-term BP goal normotensive  Hyperlipidemia Home meds:  None, Losartan stared in hospital, continue at discharge LDL 102, goal < 70 Continue statin at discharge  Diabetes type II: none   HgbA1c 4.9, goal < 7.0 CBGs No  results for input(s): "GLUCAP" in the last 72 hours.  SSI  Other Stroke Risk Factors  Advanced Age >/= 81   Other Active Problems Atrial fibrillation  Pancreatic cancer Cardiomyopathy  Hospital day # 5    To contact Stroke Continuity provider, please refer to [email or web address]. After hours, contact General Neurology

## 2022-11-29 NOTE — Progress Notes (Signed)
Daughter Shannon Ortiz, who is HCPOA requested for her bother Shannon Ortiz to be removed from contact list and all updates and information should be directed to her. Contact was removed per request.

## 2022-11-29 NOTE — Progress Notes (Signed)
Daily Progress Note   Patient Name: Shannon Ortiz       Date: 11/29/2022 DOB: 1936-05-20  Age: 87 y.o. MRN#: [id number] Attending Physician: Glade Lloyd, MD Primary Care Physician: Daisy Floro, MD Admit Date: 11/23/2022  Reason for Consultation/Follow-up: Establishing goals of care  Subjective:  Awake alert,  follows commands, aphasia.  Cannot attempts to say the word "yes", is to pronounce her daughter Beverly's name who is present in the room at bedside. Goals of care discussed again with patient and daughter Beverly-see below Length of Stay: 5  Current Medications: Scheduled Meds:   amLODipine  10 mg Oral Daily   apixaban  2.5 mg Oral BID   atorvastatin  20 mg Oral Daily   docusate sodium  100 mg Oral BID   feeding supplement  237 mL Oral BID BM   losartan  100 mg Oral q AM   metoprolol succinate  50 mg Oral Daily    Continuous Infusions:   PRN Meds: acetaminophen **OR** acetaminophen, albuterol, ALPRAZolam, ondansetron **OR** ondansetron (ZOFRAN) IV, polyethylene glycol, traZODone  Physical Exam         Appears with generalized weakness Diminished breath sounds bases No edema Awake alert follows commands, aphasia since stroke In no distress  Vital Signs: BP (!) 144/81 (BP Location: Right Arm)   Pulse 80   Temp 99 F (37.2 C) (Oral)   Resp 16   Ht 5\' 6"  (1.676 m)   Wt 55.2 kg   SpO2 98%   BMI 19.64 kg/m  SpO2: SpO2: 98 % O2 Device: O2 Device: Room Air O2 Flow Rate:    Intake/output summary:  Intake/Output Summary (Last 24 hours) at 11/29/2022 1005 Last data filed at 11/29/2022 0944 Gross per 24 hour  Intake 230 ml  Output 1500 ml  Net -1270 ml    LBM: Last BM Date : 11/26/22 Baseline Weight: Weight: 54.9 kg Most recent weight: Weight: 55.2  kg       Palliative Assessment/Data:      Patient Active Problem List   Diagnosis Date Noted   AMS (altered mental status) 11/24/2022   Atrial fibrillation with rapid ventricular response (HCC) 11/23/2022   Genetic testing 02/09/2022   Monoallelic mutation of LZTR1 gene 02/09/2022   Pancreatic cancer (HCC) 01/09/2022   Aortic atherosclerosis (HCC) 12/26/2021  Generalized abdominal pain 05/11/2021   Paroxysmal atrial fibrillation (HCC) 06/22/2019   Pacemaker 06/22/2019   Sinus node dysfunction (HCC) 03/06/2019   Weakness generalized 12/27/2018   Elevated troponin 12/26/2018   Hypomagnesemia 10/28/2018   Hiatal hernia s/p robotic repair 10/25/2018 10/25/2018   Status post robotic [place] (posterior partial) fundoplication 10/25/2018 10/25/2018   Esophageal stricture 10/15/2018   Acquired diverticulum of distal esophagus s/p robotic excision & myotomy 10/25/2018 10/03/2018   Esophageal stenosis 08/31/2018   Hypokalemia 08/31/2018   AKI (acute kidney injury) (HCC) 08/31/2018   Bradycardia 02/16/2014   Near syncope 02/16/2014   HTN (hypertension) 02/16/2014    Palliative Care Assessment & Plan   Patient Profile:    Assessment:  87 year old lady who lives at home with her daughter.  Past medical history of sinus node dysfunction status post pacemaker, history of hypertension atrial fibrillation is on Eliquis, pancreatic cancer undergoes surveillance, admitted with generalized weakness rapid atrial fibrillation.  Found to have CVA on MRI.  SLP following. MRI with scattered subcentimeter acute ischemic nonhemorrhagic infarcts involving bilateral cerebral and cerebellar hemispheres in watershed distribution-hypoperfusion injury.  SLP following and patient noted to have severe swallowing issues.  PT OT also consulted. Palliative consult for ongoing goals of care discussions has been requested.  Recommendations/Plan: Discussed with patient and daughter [name] who is at bedside  regarding goals of care discussions.  Discussed about scope of current hospitalization.  Discussed about disposition options.  Patient and family would like to continue efforts at PT OT and improving patient's nutrition.  Plan appears to be for skilled nursing facility rehabilitation attempt with palliative services following.  Discussed extensively about artificial nutrition and hydration patient and daughter [name] not in favor of artificial nutrition, not in favor of PEG tubes temporary or permanent.  They would like to continue with efforts at advancing the patient's oral intake as best as possible.     Goals of Care and Additional Recommendations: Limitations on Scope of Treatment: Full Scope Treatment  Code Status:    Code Status Orders  (From admission, onward)           Start     Ordered   11/23/22 1116  Full code  Continuous       Question:  By:  Answer:  Consent: discussion documented in EHR   11/23/22 1116           Code Status History     Date Active Date Inactive Code Status Order ID Comments User Context   03/06/2019 1650 03/07/2019 1431 Full Code [id number]  Marinus Maw, MD Inpatient   12/27/2018 0223 12/27/2018 1941 Full Code [id number]  Eduard Clos, MD Inpatient   10/25/2018 1329 10/28/2018 1723 Full Code [id number]  Karie Soda, MD Inpatient   10/15/2018 1609 10/17/2018 1740 Full Code [id number]  Jonah Blue, MD ED   08/31/2018 1834 09/02/2018 1326 Full Code [id number]  Charlsie Quest, MD ED   02/16/2014 2225 02/17/2014 1959 Full Code [id number]  Eduard Clos, MD Inpatient       Prognosis:  Unable to determine  Discharge Planning: Skilled Nursing Facility for rehab with Palliative care service follow-up  Care plan was discussed with daughter [name] and patient. [phone].  Thank you for allowing the Palliative Medicine Team to assist in the care of this patient. high MDM.      Greater than 50%  of this time was spent counseling and  coordinating care related to the above assessment and plan.  Rosalin Hawking, MD  Please contact Palliative Medicine Team phone at [phone] for questions and concerns.

## 2022-11-29 NOTE — Progress Notes (Signed)
Occupational Therapy Treatment Patient Details Name: Shannon Ortiz MRN: [id number] DOB: 04-Jun-1936 Today's Date: 11/29/2022   History of present illness 87 y.o. female with PMH of sinus node dysfunction s/p PPM, HTN, atrial fibrillation on Eliquis, thyroid and pancreatic CA who was seen for afib with RVR and elevated troponin. 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   OT comments  Focus of session on self feeding with L hand as R hand demonstrating weakness and incoordination. Pt positioned up in bed for optimal swallowing and UE use. Needing max assist to scoop food onto spoon, progressed to being able to bring spoon to mouth. With cup placed in hand, able to bring to mouth to drink. Pt does not continue activity unless given multimodal cues. Per chart, pt's family prefers rehab < 3 hours a day, updated discharge plan to reflect.    Recommendations for follow up therapy are one component of a multi-disciplinary discharge planning process, led by the attending physician.  Recommendations may be updated based on patient status, additional functional criteria and insurance authorization.    Assistance Recommended at Discharge Frequent or constant Supervision/Assistance  Patient can return home with the following  Two people to help with walking and/or transfers;A lot of help with bathing/dressing/bathroom;A lot of help with walking and/or transfers   Equipment Recommendations  Other (comment) (defer to next venue)    Recommendations for Other Services      Precautions / Restrictions Precautions Precautions: Fall Restrictions Weight Bearing Restrictions: No       Mobility Bed Mobility Overal bed mobility: Needs Assistance             General bed mobility comments: total assist to pull up in bed for optimal position for eating    Transfers                         Balance                                            ADL either performed or assessed with clinical judgement   ADL Overall ADL's : Needs assistance/impaired Eating/Feeding: Minimal assistance;Cueing for sequencing Eating/Feeding Details (indicate cue type and reason): worked on self feeding with L hand with R hand being weak and incoordinated, pt needing max assist Grooming: Wash/dry face;Bed level;Total assistance                                      Extremity/Trunk Assessment              Vision       Perception Perception Perception: Impaired   Praxis Praxis Praxis: Impaired Praxis Impairment Details: Motor planning;Initiation    Cognition Arousal/Alertness: Awake/alert Behavior During Therapy: WFL for tasks assessed/performed Overall Cognitive Status: Difficult to assess Area of Impairment: Following commands, Problem solving                       Following Commands: Follows one step commands with increased time, Follows one step commands inconsistently     Problem Solving: Slow processing, Decreased initiation, Difficulty sequencing, Requires verbal cues, Requires tactile cues General Comments: pt with aphasia        Exercises  Shoulder Instructions       General Comments      Pertinent Vitals/ Pain       Pain Assessment Pain Assessment: Faces Faces Pain Scale: No hurt  Home Living                                          Prior Functioning/Environment              Frequency  Min 1X/week        Progress Toward Goals  OT Goals(current goals can now be found in the care plan section)  Progress towards OT goals: Progressing toward goals  Acute Rehab OT Goals OT Goal Formulation: Patient unable to participate in goal setting Time For Goal Achievement: 12/11/22 Potential to Achieve Goals: Good  Plan Discharge plan remains appropriate    Co-evaluation                 AM-PAC OT "6 Clicks" Daily Activity     Outcome  Measure   Help from another person eating meals?: A Lot Help from another person taking care of personal grooming?: A Lot Help from another person toileting, which includes using toliet, bedpan, or urinal?: A Lot Help from another person bathing (including washing, rinsing, drying)?: A Lot Help from another person to put on and taking off regular upper body clothing?: A Little Help from another person to put on and taking off regular lower body clothing?: A Lot 6 Click Score: 13    End of Session    OT Visit Diagnosis: Hemiplegia and hemiparesis;Muscle weakness (generalized) (M62.81);Cognitive communication deficit (R41.841);Other symptoms and signs involving cognitive function;Feeding difficulties (R63.3) Symptoms and signs involving cognitive functions: Cerebral infarction Hemiplegia - Right/Left: Right Hemiplegia - dominant/non-dominant: Dominant Hemiplegia - caused by: Cerebral infarction   Activity Tolerance Patient tolerated treatment well   Patient Left in bed;with call bell/phone within reach;with family/visitor present;Other (comment) (palliative MD in room)   Nurse Communication          Time: [phone] OT Time Calculation (min): 24 min  Charges: OT General Charges $OT Visit: 1 Visit OT Treatments $Self Care/Home Management : 23-37 mins  Berna Spare, OTR/L Acute Rehabilitation Services Office: [phone]   Evern Bio 11/29/2022, 9:29 AM

## 2022-11-29 NOTE — Progress Notes (Signed)
PROGRESS NOTE    Shannon Ortiz  ZOX:[id number] DOB: Aug 11, 1935 DOA: 11/23/2022 PCP: Daisy Floro, MD   Brief Narrative:  87 y.o. female with medical history significant for sinus node dysfunction with pacemaker, hypertension, atrial fibrillation on Eliquis and pancreatic cancer undergoing surveillance was admitted for nonspecific weakness and altered mental status and was found to be in rapid A-fib.  CT head was unremarkable on presentation.  She received IV metoprolol in the ED and converted back to normal sinus rhythm.  She had significantly elevated troponin, cardiology was consulted.  MRI of the brain showed acute CVA.  Neurology was consulted.  Palliative care also consulted for goals of care discussion.  PT recommended SNF/CIR placement.  Assessment & Plan:   Acute CVA Dysphagia: Most likely neurogenic from acute CVA -MRI showed multiple scattered bilateral cerebral and cerebellar hemisphere ischemic infarcts -Echo showed EF of 45 to 50%.  Carotid Doppler negative. -Neurology evaluation appreciated.  Currently on Eliquis.  Follow further neurology recommendations -LDL 102.  On Lipitor 20 mg daily as per neurology.  A1c 4.9. -Diet as per SLP recommendations.  Oral intake is not appropriate yet. -PT/OT recommending SNF/CIR placement.  Family interested in SNF placement.  TOC following.  Paroxysmal A-fib with RVR -Currently rate controlled.  Converted to normal sinus rhythm after IV fluids and metoprolol.  Continue Eliquis and metoprolol succinate.  Cardiology following  Cardiomyopathy -Echo showed EF of 45 to 50% during this hospitalization.  Previous echo in July 2020 showed EF of 55 to 60%.  Strict input and output.  Daily weights.  Continue metoprolol succinate and losartan.  Cardiology following.  No signs of volume overload at this time.  Elevated troponins -Likely related to tachycardia.  Troponins trended to 454.  No chest pain.  No further intervention needed as per  cardiology.  History of pacemaker placement for sinus node dysfunction -Cardiology following.  Hypertension -Monitor blood pressure.  Continue amlodipine, losartan and metoprolol succinate.  No need for permissive hypertension as per neurology.  Microcytic anemia -Questionable cause.  Hemoglobin stable.  Monitor intermittently  Thrombocytopenia -Resolved  Leukocytosis -Resolved  Hyponatremia -Mild.  Sodium 133 today.  Monitor intermittently  Hypokalemia -Improved.  Goals of care -Overall prognosis is guarded to poor because of recent CVA and dysphagia and inability to speak with poor oral intake.  Palliative care following.  Remains full code.   DVT prophylaxis: Eliquis Code Status: Full Family Communication: Spoke to daughter/Beverly, power of attorney at bedside.  She does not want medical team talking to her brother/Marshall.   Disposition Plan: Status is: Inpatient Remains inpatient appropriate because: Of severity of illness.  Need for SNF placement    Consultants: Cardiology/neurology/palliative care  Procedures: Echo  Antimicrobials: None   Subjective: Patient seen and examined at bedside.  Poor historian: Able to follow some commands but speech is still impaired.  No agitation, fever or vomiting reported.  Oral intake is still not appropriate. Objective: Vitals:   11/28/22 1400 11/28/22 1700 11/28/22 2100 11/29/22 0600  BP: 131/79 (!) 153/93 120/74 (!) 147/80  Pulse: 63  67   Resp: 15 16 18 16   Temp:   98.2 F (36.8 C)   TempSrc:   Oral   SpO2: 98% 98% 99%   Weight:      Height:        Intake/Output Summary (Last 24 hours) at 11/29/2022 0813 Last data filed at 11/29/2022 0656 Gross per 24 hour  Intake 780 ml  Output 1500 ml  Net -  720 ml    Filed Weights   11/23/22 0809 11/27/22 0530  Weight: 54.9 kg 55.2 kg    Examination:  General: Currently on room air.  No distress.  Chronically ill and deconditioned looking.  Elderly female lying in  bed. ENT/neck: No thyromegaly.  JVD is not elevated  respiratory: Decreased breath sounds at bases bilaterally with some crackles; no wheezing  CVS: S1-S2 heard, rate controlled currently Abdominal: Soft, nontender, slightly distended; no organomegaly, bowel sounds are heard Extremities: Trace lower extremity edema; no cyanosis  CNS: Awake, able to follow some commands but still having hard time speaking.   Lymph: No obvious lymphadenopathy Skin: No obvious ecchymosis/lesions  psych: Showing no signs of agitation.  Smiles intermittently.   Musculoskeletal: No obvious joint swelling/deformity     Data Reviewed: I have personally reviewed following labs and imaging studies  CBC: Recent Labs  Lab 11/23/22 0839 11/24/22 0424 11/26/22 0443 11/27/22 0415  WBC 13.6* 8.6 4.1 4.3  HGB 10.8* 9.0* 9.2* 9.4*  HCT 33.1* 28.0* 28.1* 27.8*  MCV 72.6* 75.7* 73.0* 71.1*  PLT 131* 102* 146* 171    Basic Metabolic Panel: Recent Labs  Lab 11/23/22 0839 11/24/22 0424 11/26/22 0441 11/26/22 0443 11/27/22 0415 11/29/22 0433  NA 133* 132*  --  131* 133* 133*  K 3.3* 3.7  --  3.3* 3.4* 3.8  CL 98 102  --  101 102 101  CO2 24 23  --  21* 23 25  GLUCOSE 136* 97  --  89 95 89  BUN 20 17  --  10 9 9   CREATININE 1.00 0.76  --  0.63 0.62 0.74  CALCIUM 9.6 8.8*  --  8.5* 8.9 9.3  MG  --   --  1.9  --   --  1.9    GFR: Estimated Creatinine Clearance: 45.6 mL/min (by C-G formula based on SCr of 0.74 mg/dL). Liver Function Tests: Recent Labs  Lab 11/23/22 0839  AST 48*  ALT 21  ALKPHOS 167*  BILITOT 1.0  PROT 7.9  ALBUMIN 3.6    No results for input(s): "LIPASE", "AMYLASE" in the last 168 hours. No results for input(s): "AMMONIA" in the last 168 hours. Coagulation Profile: No results for input(s): "INR", "PROTIME" in the last 168 hours. Cardiac Enzymes: No results for input(s): "CKTOTAL", "CKMB", "CKMBINDEX", "TROPONINI" in the last 168 hours. BNP (last 3 results) No results for  input(s): "PROBNP" in the last 8760 hours. HbA1C: No results for input(s): "HGBA1C" in the last 72 hours. CBG: Recent Labs  Lab 11/23/22 0830  GLUCAP 127*    Lipid Profile: No results for input(s): "CHOL", "HDL", "LDLCALC", "TRIG", "CHOLHDL", "LDLDIRECT" in the last 72 hours. Thyroid Function Tests: No results for input(s): "TSH", "T4TOTAL", "FREET4", "T3FREE", "THYROIDAB" in the last 72 hours. Anemia Panel: No results for input(s): "VITAMINB12", "FOLATE", "FERRITIN", "TIBC", "IRON", "RETICCTPCT" in the last 72 hours. Sepsis Labs: No results for input(s): "PROCALCITON", "LATICACIDVEN" in the last 168 hours.  Recent Results (from the past 240 hour(s))  SARS Coronavirus 2 by RT PCR (hospital order, performed in [facility] hospital lab) *cepheid single result test* Anterior Nasal Swab     Status: None   Collection Time: 11/23/22  9:04 AM   Specimen: Anterior Nasal Swab  Result Value Ref Range Status   SARS Coronavirus 2 by RT PCR NEGATIVE NEGATIVE Final    Comment: (NOTE) SARS-CoV-2 target nucleic acids are NOT DETECTED.  The SARS-CoV-2 RNA is generally detectable in upper and lower respiratory  specimens during the acute phase of infection. The lowest concentration of SARS-CoV-2 viral copies this assay can detect is 250 copies / mL. A negative result does not preclude SARS-CoV-2 infection and should not be used as the sole basis for treatment or other patient management decisions.  A negative result may occur with improper specimen collection / handling, submission of specimen other than nasopharyngeal swab, presence of viral mutation(s) within the areas targeted by this assay, and inadequate number of viral copies (<250 copies / mL). A negative result must be combined with clinical observations, patient history, and epidemiological information.  Fact Sheet for Patients:   RoadLapTop.co.za  Fact Sheet for Healthcare  Providers: http://kim-miller.com/  This test is not yet approved or  cleared by the [place] FDA and has been authorized for detection and/or diagnosis of SARS-CoV-2 by FDA under an Emergency Use Authorization (EUA).  This EUA will remain in effect (meaning this test can be used) for the duration of the COVID-19 declaration under Section 564(b)(1) of the Act, 21 U.S.C. section 360bbb-3(b)(1), unless the authorization is terminated or revoked sooner.  Performed at [facility], [address], [place], [place] [address]          Radiology Studies: No results found.      Scheduled Meds:  amLODipine  10 mg Oral Daily   apixaban  2.5 mg Oral BID   atorvastatin  20 mg Oral Daily   docusate sodium  100 mg Oral BID   feeding supplement  237 mL Oral BID BM   losartan  100 mg Oral q AM   metoprolol succinate  50 mg Oral Daily   Continuous Infusions:        Glade Lloyd, MD [facility] 11/29/2022, 8:13 AM

## 2022-11-30 ENCOUNTER — Encounter (HOSPITAL_COMMUNITY): Payer: Self-pay | Admitting: Family Medicine

## 2022-11-30 DIAGNOSIS — I4891 Unspecified atrial fibrillation: Secondary | ICD-10-CM | POA: Diagnosis not present

## 2022-11-30 DIAGNOSIS — Z950 Presence of cardiac pacemaker: Secondary | ICD-10-CM | POA: Diagnosis not present

## 2022-11-30 MED ORDER — ENSURE ENLIVE PO LIQD
237.0000 mL | Freq: Two times a day (BID) | ORAL | Status: DC
  Administered 2022-11-30 – 2022-12-03 (×5): 237 mL via ORAL

## 2022-11-30 MED ORDER — ADULT MULTIVITAMIN W/MINERALS CH
1.0000 | ORAL_TABLET | Freq: Every day | ORAL | Status: DC
  Administered 2022-12-01 – 2022-12-03 (×3): 1 via ORAL
  Filled 2022-11-30 (×3): qty 1

## 2022-11-30 NOTE — Progress Notes (Signed)
Initial Nutrition Assessment  DOCUMENTATION CODES:   Severe malnutrition in context of chronic illness  INTERVENTION:  - Diet per SLP recs. - Automatic house trays to ensure patient receives 3 meals a day. - Diet education with handouts provided for increasing kcals and protein. - Ensure Plus High Protein po BID, each supplement provides 350 kcal and 20 grams of protein. - Magic cup TID with meals, each supplement provides 290 kcal and 9 grams of protein - Multivitamin with minerals daily - Monitor weight trends.   NUTRITION DIAGNOSIS:   Severe Malnutrition related to chronic illness as evidenced by severe fat depletion, severe muscle depletion.  GOAL:   Patient will meet greater than or equal to 90% of their needs  MONITOR:   PO intake, Supplement acceptance, Diet advancement, Weight trends  REASON FOR ASSESSMENT:   Consult Assessment of nutrition requirement/status  ASSESSMENT:   87 y.o. female with PMH HTN, atrial fibrillation on Eliquis, and pancreatic cancer undergoing surveillance who was admitted to for a couple of weeks of nonspecific weakness, found to be in rapid atrial fibrillation. Later found to have hypoperfusion injury.   Patient sitting in bedside chair at time of visit. Patient noted to be aphasic and unable to respond to questions fully. Able to tell me the Ensure Surgery she has been receiving is okay, likes vanilla and agreeable to try Magic Cup.  Re-visited with patient later in afternoon to follow up with family for nutrition history. Daughter reports patient's UBW to be 118#, unsure of any weight changes.   Daughter states patient was eating 3 meals a day PTA.  Breakfast: eggs, grits, toast, applesauce, 1 Ensure Lunch: a sandwich Dinner: prepared meal  Since admit and dysphagia noted, patient being followed by SLP and recommended a full liquid diet. Daughter reports patient has not been eating very much off tray. Documented to be consuming 0-50% of  meals. She is agreeable to switch to regular Ensure Plus High protein and add Magic Cup to support intake.   Also provided "High-Calorie, High-Protein Nutrition Therapy" and "Tips for Increasing Calories" diet education handouts to family. Discussed tips to increase kcals while still adhering to current diet recommendations.  SLP outside room at end of visit. Hopeful to advance patient to pureed diet for more options.   RD consult received had comment inquiring about calorie count. Palliative care is following patient and the note yesterday indicates the daughter is not in favor of artifical nutrition and not in favor of PEG tubes either temporary or permanent. Discussed with Dr. Hanley Ben, no plan for calorie count.   Medications reviewed and include: Colace  Labs reviewed:  Na 133   NUTRITION - FOCUSED PHYSICAL EXAM:  Flowsheet Row Most Recent Value  Orbital Region Severe depletion  Upper Arm Region Severe depletion  Thoracic and Lumbar Region Moderate depletion  Buccal Region Moderate depletion  Temple Region Severe depletion  Clavicle Bone Region Severe depletion  Clavicle and Acromion Bone Region Severe depletion  Scapular Bone Region Unable to assess  Dorsal Hand Moderate depletion  Patellar Region Moderate depletion  Anterior Thigh Region Moderate depletion  Posterior Calf Region Moderate depletion  Edema (RD Assessment) None  Hair Reviewed  Eyes Reviewed  Mouth Reviewed  Skin Reviewed  Nails Reviewed       Diet Order:   Diet Order             Diet full liquid Room service appropriate? Yes; Fluid consistency: Thin  Diet effective now  EDUCATION NEEDS:  Education needs have been addressed  Skin:  Skin Assessment: Reviewed RN Assessment  Last BM:  6/13  Height:  Ht Readings from Last 1 Encounters:  11/23/22 5\' 6"  (1.676 m)   Weight:  Wt Readings from Last 1 Encounters:  11/27/22 55.2 kg    BMI:  Body mass index is 19.64  kg/m.  Estimated Nutritional Needs:  Kcal:  1650-1800 kcals Protein:  70-80 grams Fluid:  >/= 1.6L    Shelle Iron RD, LDN For contact information, refer to [facility].

## 2022-11-30 NOTE — TOC Progression Note (Addendum)
Transition of Care [facility]) - Progression Note    Patient Details  Name: Shannon Ortiz MRN: [id number] Date of Birth: January 17, 1936  Transition of [facility]) CM/SW Contact  Larrie Kass, LCSW Phone Number: 11/30/2022, 12:32 PM  Clinical Narrative:    CSW spoke with pt's daughter [name] to present bed offers, she has chosen [facility] for her mother's SNF placement. CSW to start insurance auth.  TOC to follow.   ADDEN 1:20pm Auth pending. TOC to follow.   Expected Discharge Plan: Skilled Nursing Facility Barriers to Discharge: Continued Medical Work up  Expected Discharge Plan and Services       Living arrangements for the past 2 months: Single Family Home                                       Social Determinants of Health (SDOH) Interventions SDOH Screenings   Food Insecurity: No Food Insecurity (11/23/2022)  Housing: Patient Declined (11/23/2022)  Transportation Needs: No Transportation Needs (11/23/2022)  Utilities: Not At Risk (11/23/2022)  Tobacco Use: Low Risk  (11/30/2022)    Readmission Risk Interventions     No data to display

## 2022-11-30 NOTE — Progress Notes (Addendum)
Physical Therapy Treatment Patient Details Name: Shannon Ortiz MRN: [id number] DOB: 05-07-1936 Today's Date: 11/30/2022   History of Present Illness 87 y.o. female with PMH of sinus node dysfunction s/p PPM, HTN, atrial fibrillation on Eliquis, thyroid and pancreatic CA who was seen for afib with RVR and elevated troponin. 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  R>L Hemiparesis    PT Comments    General Comments: globally aphasic, poor eye tracking esp to RIGHT.  smiles and mimics at times.  Following repeat simple commands. Assisted OOB to amb required increased time and + 2 assist.  General bed mobility comments: Max Assist + 2 for upper and lower body.  Poor dynamic sitting balnce with NO self coorection.  Fair- static sitting balance with severe R lean and again no self coorection.  No functional use B UE's to support self.  Mild R head tilt laterally. General transfer comment: required Max Asisst + 2 to rise as well as asissted with a BSC transfer.  VERY unstable.  No self weightshifting as well as R LE advancement.  pt did not functionally use hands to steady self and balance VERY impaired with little to no self correction.  Severe right lean.  + 2 assist to adjust.to midline.  Assisted off BSC + 2 side by side with Max R lean, Therapist corrected.  Hand over hand assist to place B UE's onto [place].  Poor to little R hand grip. General Gait Details: Poor balance with severe R lean.  Ingaged hip and knee stabelizers but ankle presents paresis.  Therapist assited with correcting R lean, R hand grip, weight shift to L as wll as R foot advancement to achieve gait distance of 12 feet before pt showing signs of fatigue.  Recliner following closely behind. Returned to room in recliner and positioned to midline/comfort.   Pt will need ST Rehab at SNF to address mobility and functional decline prior to safely returning home.   Recommendations for follow  up therapy are one component of a multi-disciplinary discharge planning process, led by the attending physician.  Recommendations may be updated based on patient status, additional functional criteria and insurance authorization.  Follow Up Recommendations  Can patient physically be transported by private vehicle: No    Assistance Recommended at Discharge Intermittent Supervision/Assistance  Patient can return home with the following A lot of help with walking and/or transfers;A lot of help with bathing/dressing/bathroom;Two people to help with walking and/or transfers;Two people to help with bathing/dressing/bathroom   Equipment Recommendations  None recommended by PT    Recommendations for Other Services       Precautions / Restrictions Precautions Precautions: Fall Precaution Comments: new CVA Restrictions Weight Bearing Restrictions: No     Mobility  Bed Mobility Overal bed mobility: Needs Assistance Bed Mobility: Supine to Sit     Supine to sit: Max assist, +2 for physical assistance, +2 for safety/equipment     General bed mobility comments: Max Assist + 2 for upper and lower body.  Poor dynamic sitting balnce with NO self coorection.  Fair- static sitting balance with severe R lean and again no self coorection.  No functional use B UE's to support self.  Mild R head tilt laterally.    Transfers Overall transfer level: Needs assistance Equipment used: Rolling [place] (2 wheels), None Transfers: Sit to/from Stand, Bed to chair/wheelchair/BSC Sit to Stand: Max assist, +2 physical assistance, +2 safety/equipment   Step pivot transfers: Max  assist, +2 physical assistance, +2 safety/equipment       General transfer comment: required Max Asisst + 2 to rise as well as asissted with a BSC transfer.  VERY unstable.  No self weightshifting as well as R LE advancement.  pt did not functionally use hands to steady self and balance VERY impaired with little to no self correction.   Severe right lean.  + 2 assist to adjust.to midline.  Assisted off BSC + 2 side by side with Max R lean, Therapist corrected.  Hand over hand assist to place B UE's onto [place].  Poor to little R hand grip.    Ambulation/Gait Ambulation/Gait assistance: Max assist, +2 physical assistance, +2 safety/equipment Gait Distance (Feet): 12 Feet Assistive device: Rolling [place] (2 wheels) Gait Pattern/deviations: Step-to pattern Gait velocity: decreased     General Gait Details: Poor balance with severe R lean.  Ingaged hip and knee stabelizers but ankle presents paresis.  Therapist assited with correcting R lean, R hand grip, weight shift to L as wll as R foot advancement to achieve gait distance of 12 feet beofr pt shoeing signs of fatigue.  Recliner following closely behind.   Stairs             Wheelchair Mobility    Modified Rankin (Stroke Patients Only)       Balance                                            Cognition Arousal/Alertness: Awake/alert Behavior During Therapy: Flat affect                                   General Comments: globally aphasic, poor eye tracking esp to RIGHT.  smiles and mimics at times.  Following repeat simple commands.        Exercises      General Comments        Pertinent Vitals/Pain Pain Assessment Pain Assessment: No/denies pain    Home Living                          Prior Function            PT Goals (current goals can now be found in the care plan section) Progress towards PT goals: Progressing toward goals    Frequency    Min 1X/week      PT Plan Current plan remains appropriate    Co-evaluation              AM-PAC PT "6 Clicks" Mobility   Outcome Measure  Help needed turning from your back to your side while in a flat bed without using bedrails?: A Lot Help needed moving from lying on your back to sitting on the side of a flat bed without using  bedrails?: A Lot Help needed moving to and from a bed to a chair (including a wheelchair)?: A Lot Help needed standing up from a chair using your arms (e.g., wheelchair or bedside chair)?: A Lot Help needed to walk in hospital room?: Total Help needed climbing 3-5 steps with a railing? : Total 6 Click Score: 10    End of Session Equipment Utilized During Treatment: Gait belt Activity Tolerance: Patient limited by fatigue Patient left: in chair;with call bell/phone within  reach;with chair alarm set;with family/visitor present Nurse Communication: Mobility status PT Visit Diagnosis: Other abnormalities of gait and mobility (R26.89);Difficulty in walking, not elsewhere classified (R26.2)     Time: 1020-1045 PT Time Calculation (min) (ACUTE ONLY): 25 min  Charges:  $Gait Training: 8-22 mins $Therapeutic Activity: 8-22 mins                     Felecia Shelling  PTA Acute  Rehabilitation Services Office M-F          [phone]

## 2022-11-30 NOTE — Care Management Important Message (Signed)
Important Message  Patient Details IM Letter given. Name: Shannon Ortiz MRN: [id number] Date of Birth: 05/14/1936   Medicare Important Message Given:  Yes     Caren Macadam 11/30/2022, 2:08 PM

## 2022-11-30 NOTE — Progress Notes (Signed)
Speech Language Pathology Treatment: Dysphagia  Patient Details Name: Shannon Ortiz MRN: [id number] DOB: 18-Nov-1935 Today's Date: 11/30/2022 Time: 8657-8469 SLP Time Calculation (min) (ACUTE ONLY): 15 min  Assessment / Plan / Recommendation Clinical Impression  Patient seen by SLP for skilled treatment focused on dysphagia goals. Daughters in room and SLP did educated them regarding current PO recommendations. Patient was awake and alert, participated in trials of pudding texture solids but required cues for attention and to initiate swallow. She exhibited prolonged oral phase, laryngeal pumping and suspected swallow initiation delay. No oral residuals observed s/p swallows. SLP recommending continue with current diet but will plan for trial tray of puree solids next date to determine if patient can tolerate. Discharge plan is now for SNF per social work notes.   HPI HPI: 87 y.o. female with medical history pancreatic ca, cardiac issues, adm with nonspecific weakness x few weeks, found to be in rapid atrial fibrillation. MRI brain: Scattered subcentimeter acute ischemic nonhemorrhagic infarcts involving the bilateral cerebral and cerebellar hemispheres, watershed in distribution. Swallow and speech/language evaluation completed with full liquids recommended per MBS d/t timing concerns; severe oral phase dysphagia.  Aphasia tx recommended.  ST f/u for aphasia/swallow tx.      SLP Plan  Continue with current plan of care      Recommendations for follow up therapy are one component of a multi-disciplinary discharge planning process, led by the attending physician.  Recommendations may be updated based on patient status, additional functional criteria and insurance authorization.    Recommendations  Diet recommendations: Thin liquid;Nectar-thick liquid Liquids provided via: Teaspoon;Cup;Straw Supervision: Full supervision/cueing for compensatory strategies;Trained caregiver to feed patient;Staff  to assist with self feeding Compensations: Slow rate;Small sips/bites Postural Changes and/or Swallow Maneuvers: Seated upright 90 degrees;Upright 30-60 min after meal                  Oral care BID   Frequent or constant Supervision/Assistance Dysphagia, oropharyngeal phase (R13.12)     Continue with current plan of care    Angela Nevin, MA, CCC-SLP Speech Therapy

## 2022-11-30 NOTE — Progress Notes (Signed)
Daily Progress Note   Patient Name: Shannon Ortiz       Date: 11/30/2022 DOB: 1935/12/31  Age: 87 y.o. MRN#: [id number] Attending Physician: Glade Lloyd, MD Primary Care Physician: Daisy Floro, MD Admit Date: 11/23/2022  Reason for Consultation/Follow-up: Establishing goals of care  Subjective:  Awake alert,  follows commands, aphasia.  Sitting up in a chair.  Chart reviewed.  Patient attempting to participate more and more with PT.    Length of Stay: 6  Current Medications: Scheduled Meds:  . amLODipine  10 mg Oral Daily  . apixaban  2.5 mg Oral BID  . atorvastatin  20 mg Oral Daily  . docusate sodium  100 mg Oral BID  . feeding supplement  237 mL Oral BID BM  . losartan  100 mg Oral q AM  . metoprolol succinate  50 mg Oral Daily    Continuous Infusions:   PRN Meds: acetaminophen **OR** acetaminophen, albuterol, ALPRAZolam, ondansetron **OR** ondansetron (ZOFRAN) IV, polyethylene glycol, traZODone  Physical Exam         Appears with generalized weakness Diminished breath sounds bases No edema Awake alert follows commands, aphasia since stroke In no distress  Vital Signs: BP (!) 160/74 (BP Location: Right Arm)   Pulse 63   Temp 98.1 F (36.7 C) (Oral)   Resp 20   Ht 5\' 6"  (1.676 m)   Wt 55.2 kg   SpO2 100%   BMI 19.64 kg/m  SpO2: SpO2: 100 % O2 Device: O2 Device: Room Air O2 Flow Rate:    Intake/output summary:  Intake/Output Summary (Last 24 hours) at 11/30/2022 1226 Last data filed at 11/30/2022 4098 Gross per 24 hour  Intake 710 ml  Output 0 ml  Net 710 ml    LBM: Last BM Date : 11/26/22 (per daughter) Baseline Weight: Weight: 54.9 kg Most recent weight: Weight: 55.2 kg       Palliative Assessment/Data:      Patient Active Problem List    Diagnosis Date Noted  . AMS (altered mental status) 11/24/2022  . Atrial fibrillation with rapid ventricular response (HCC) 11/23/2022  . Genetic testing 02/09/2022  . Monoallelic mutation of LZTR1 gene 02/09/2022  . Pancreatic cancer (HCC) 01/09/2022  . Aortic atherosclerosis (HCC) 12/26/2021  . Generalized abdominal pain 05/11/2021  . Paroxysmal atrial fibrillation (HCC)  06/22/2019  . Pacemaker 06/22/2019  . Sinus node dysfunction (HCC) 03/06/2019  . Weakness generalized 12/27/2018  . Elevated troponin 12/26/2018  . Hypomagnesemia 10/28/2018  . Hiatal hernia s/p robotic repair 10/25/2018 10/25/2018  . Status post robotic [place] (posterior partial) fundoplication 10/25/2018 10/25/2018  . Esophageal stricture 10/15/2018  . Acquired diverticulum of distal esophagus s/p robotic excision & myotomy 10/25/2018 10/03/2018  . Esophageal stenosis 08/31/2018  . Hypokalemia 08/31/2018  . AKI (acute kidney injury) (HCC) 08/31/2018  . Bradycardia 02/16/2014  . Near syncope 02/16/2014  . HTN (hypertension) 02/16/2014    Palliative Care Assessment & Plan   Patient Profile:    Assessment:  87 year old lady who lives at home with her daughter.  Past medical history of sinus node dysfunction status post pacemaker, history of hypertension atrial fibrillation is on Eliquis, pancreatic cancer undergoes surveillance, admitted with generalized weakness rapid atrial fibrillation.  Found to have CVA on MRI.  SLP following. MRI with scattered subcentimeter acute ischemic nonhemorrhagic infarcts involving bilateral cerebral and cerebellar hemispheres in watershed distribution-hypoperfusion injury.  SLP following and patient noted to have severe swallowing issues.  PT OT also consulted. Palliative consult for ongoing goals of care discussions has been requested.  Recommendations/Plan: Full code-full scope Skilled nursing facility rehabilitation attempt Continue efforts at PT/OT and SLP.    Goals of  Care and Additional Recommendations: Limitations on Scope of Treatment: Full Scope Treatment  Code Status:    Code Status Orders  (From admission, onward)           Start     Ordered   11/23/22 1116  Full code  Continuous       Question:  By:  Answer:  Consent: discussion documented in EHR   11/23/22 1116           Code Status History     Date Active Date Inactive Code Status Order ID Comments User Context   03/06/2019 1650 03/07/2019 1431 Full Code [id number]  Marinus Maw, MD Inpatient   12/27/2018 0223 12/27/2018 1941 Full Code [id number]  Eduard Clos, MD Inpatient   10/25/2018 1329 10/28/2018 1723 Full Code [id number]  Karie Soda, MD Inpatient   10/15/2018 1609 10/17/2018 1740 Full Code [id number]  Jonah Blue, MD ED   08/31/2018 1834 09/02/2018 1326 Full Code [id number]  Charlsie Quest, MD ED   02/16/2014 2225 02/17/2014 1959 Full Code [id number]  Eduard Clos, MD Inpatient       Prognosis:  Unable to determine  Discharge Planning: Skilled Nursing Facility for rehab with Palliative care service follow-up  Care plan was discussed with IDT  Thank you for allowing the Palliative Medicine Team to assist in the care of this patient. low MDM.      Greater than 50%  of this time was spent counseling and coordinating care related to the above assessment and plan.  Rosalin Hawking, MD  Please contact Palliative Medicine Team phone at [phone] for questions and concerns.

## 2022-11-30 NOTE — Progress Notes (Signed)
PROGRESS NOTE    Shannon Ortiz  WUJ:[id number] DOB: 01/28/1936 DOA: 11/23/2022 PCP: Daisy Floro, MD   Brief Narrative:  87 y.o. female with medical history significant for sinus node dysfunction with pacemaker, hypertension, atrial fibrillation on Eliquis and pancreatic cancer undergoing surveillance was admitted for nonspecific weakness and altered mental status and was found to be in rapid A-fib.  CT head was unremarkable on presentation.  She received IV metoprolol in the ED and converted back to normal sinus rhythm.  She had significantly elevated troponin, cardiology was consulted.  MRI of the brain showed acute CVA.  Neurology was consulted.  Palliative care also consulted for goals of care discussion.  PT recommended SNF/CIR placement.  Assessment & Plan:   Acute CVA Dysphagia: Most likely neurogenic from acute CVA -MRI showed multiple scattered bilateral cerebral and cerebellar hemisphere ischemic infarcts -Echo showed EF of 45 to 50%.  Carotid Doppler negative. -Neurology follow-up appreciated: Signed off on 11/29/2022.  Currently on Eliquis.   -LDL 102.  On Lipitor 20 mg daily as per neurology.  A1c 4.9. -Diet as per SLP recommendations.  Oral intake is not appropriate yet. -PT/OT recommending SNF/CIR placement.  Family interested in SNF placement.  TOC following.  Paroxysmal A-fib with RVR -Currently rate controlled.  Converted to normal sinus rhythm after IV fluids and metoprolol.  Continue Eliquis and metoprolol succinate.  Cardiology following  Cardiomyopathy -Echo showed EF of 45 to 50% during this hospitalization.  Previous echo in July 2020 showed EF of 55 to 60%.  Strict input and output.  Daily weights.  Continue metoprolol succinate and losartan.  Cardiology following.  No signs of volume overload at this time.  Elevated troponins -Likely related to tachycardia.  Troponins trended to 454.  No chest pain.  No further intervention needed as per  cardiology.  History of pacemaker placement for sinus node dysfunction -Cardiology following.  Hypertension -Monitor blood pressure.  Continue amlodipine, losartan and metoprolol succinate.  No need for permissive hypertension as per neurology.  Microcytic anemia -Questionable cause.  Hemoglobin stable.  Monitor intermittently  Thrombocytopenia -Resolved  Leukocytosis -Resolved  Hyponatremia -Mild.  Sodium 133 on 11/29/2022.  Monitor intermittently  Hypokalemia -Improved.  Goals of care -Overall prognosis is guarded to poor because of recent CVA and dysphagia and inability to speak with poor oral intake.  Palliative care following.  Remains full code.   DVT prophylaxis: Eliquis Code Status: Full Family Communication: Grandson at bedside Disposition Plan: Status is: Inpatient Remains inpatient appropriate because: Of severity of illness.  Need for SNF placement    Consultants: Cardiology/neurology/palliative care  Procedures: Echo  Antimicrobials: None   Subjective: Patient seen and examined at bedside.  Poor historian: Hardly able to speak; follows some commands.  Oral intake is still poor.  No fever, agitation or vomiting reported.  Objective: Vitals:   11/29/22 0600 11/29/22 0932 11/29/22 1708 11/29/22 2100  BP: (!) 147/80 (!) 144/81 (!) 149/85 126/78  Pulse:  80  65  Resp: 16 16 18 19   Temp:  99 F (37.2 C) 97.8 F (36.6 C) 98.2 F (36.8 C)  TempSrc:  Oral Oral Oral  SpO2:  98% 98% 98%  Weight:      Height:        Intake/Output Summary (Last 24 hours) at 11/30/2022 0754 Last data filed at 11/30/2022 0100 Gross per 24 hour  Intake 520 ml  Output 0 ml  Net 520 ml    Filed Weights   11/23/22 0809 11/27/22  0530  Weight: 54.9 kg 55.2 kg    Examination:  General: No acute chest.  On room air currently.  Chronically ill and deconditioned looking.  Elderly female lying in bed. ENT/neck: No JVD elevation or palpable neck masses noted respiratory:  Bilateral decreased breath sounds at bases with scattered crackles  CVS: Rate mostly controlled; S1 and S2 are heard Abdominal: Soft, nontender, still slightly distended; no organomegaly, bowel sounds normally heard  extremities: No clubbing; mild lower extremity edema present CNS: Alert, able to follow some commands but still aphasic  lymph: No obvious cervical lymphadenopathy Skin: No obvious rashes/petechiae psych: Able to smile intermittently.  Not agitated currently.   Musculoskeletal: No obvious joint erythema or tenderness    Data Reviewed: I have personally reviewed following labs and imaging studies  CBC: Recent Labs  Lab 11/23/22 0839 11/24/22 0424 11/26/22 0443 11/27/22 0415  WBC 13.6* 8.6 4.1 4.3  HGB 10.8* 9.0* 9.2* 9.4*  HCT 33.1* 28.0* 28.1* 27.8*  MCV 72.6* 75.7* 73.0* 71.1*  PLT 131* 102* 146* 171    Basic Metabolic Panel: Recent Labs  Lab 11/23/22 0839 11/24/22 0424 11/26/22 0441 11/26/22 0443 11/27/22 0415 11/29/22 0433  NA 133* 132*  --  131* 133* 133*  K 3.3* 3.7  --  3.3* 3.4* 3.8  CL 98 102  --  101 102 101  CO2 24 23  --  21* 23 25  GLUCOSE 136* 97  --  89 95 89  BUN 20 17  --  10 9 9   CREATININE 1.00 0.76  --  0.63 0.62 0.74  CALCIUM 9.6 8.8*  --  8.5* 8.9 9.3  MG  --   --  1.9  --   --  1.9    GFR: Estimated Creatinine Clearance: 45.6 mL/min (by C-G formula based on SCr of 0.74 mg/dL). Liver Function Tests: Recent Labs  Lab 11/23/22 0839  AST 48*  ALT 21  ALKPHOS 167*  BILITOT 1.0  PROT 7.9  ALBUMIN 3.6    No results for input(s): "LIPASE", "AMYLASE" in the last 168 hours. No results for input(s): "AMMONIA" in the last 168 hours. Coagulation Profile: No results for input(s): "INR", "PROTIME" in the last 168 hours. Cardiac Enzymes: No results for input(s): "CKTOTAL", "CKMB", "CKMBINDEX", "TROPONINI" in the last 168 hours. BNP (last 3 results) No results for input(s): "PROBNP" in the last 8760 hours. HbA1C: No results for  input(s): "HGBA1C" in the last 72 hours. CBG: Recent Labs  Lab 11/23/22 0830  GLUCAP 127*    Lipid Profile: No results for input(s): "CHOL", "HDL", "LDLCALC", "TRIG", "CHOLHDL", "LDLDIRECT" in the last 72 hours. Thyroid Function Tests: No results for input(s): "TSH", "T4TOTAL", "FREET4", "T3FREE", "THYROIDAB" in the last 72 hours. Anemia Panel: No results for input(s): "VITAMINB12", "FOLATE", "FERRITIN", "TIBC", "IRON", "RETICCTPCT" in the last 72 hours. Sepsis Labs: No results for input(s): "PROCALCITON", "LATICACIDVEN" in the last 168 hours.  Recent Results (from the past 240 hour(s))  SARS Coronavirus 2 by RT PCR (hospital order, performed in [facility] hospital lab) *cepheid single result test* Anterior Nasal Swab     Status: None   Collection Time: 11/23/22  9:04 AM   Specimen: Anterior Nasal Swab  Result Value Ref Range Status   SARS Coronavirus 2 by RT PCR NEGATIVE NEGATIVE Final    Comment: (NOTE) SARS-CoV-2 target nucleic acids are NOT DETECTED.  The SARS-CoV-2 RNA is generally detectable in upper and lower respiratory specimens during the acute phase of infection. The lowest concentration of  SARS-CoV-2 viral copies this assay can detect is 250 copies / mL. A negative result does not preclude SARS-CoV-2 infection and should not be used as the sole basis for treatment or other patient management decisions.  A negative result may occur with improper specimen collection / handling, submission of specimen other than nasopharyngeal swab, presence of viral mutation(s) within the areas targeted by this assay, and inadequate number of viral copies (<250 copies / mL). A negative result must be combined with clinical observations, patient history, and epidemiological information.  Fact Sheet for Patients:   RoadLapTop.co.za  Fact Sheet for Healthcare Providers: http://kim-miller.com/  This test is not yet approved or  cleared by  the [place] FDA and has been authorized for detection and/or diagnosis of SARS-CoV-2 by FDA under an Emergency Use Authorization (EUA).  This EUA will remain in effect (meaning this test can be used) for the duration of the COVID-19 declaration under Section 564(b)(1) of the Act, 21 U.S.C. section 360bbb-3(b)(1), unless the authorization is terminated or revoked sooner.  Performed at [facility], [address], [place], [place] [address]          Radiology Studies: No results found.      Scheduled Meds:  amLODipine  10 mg Oral Daily   apixaban  2.5 mg Oral BID   atorvastatin  20 mg Oral Daily   docusate sodium  100 mg Oral BID   feeding supplement  237 mL Oral BID BM   losartan  100 mg Oral q AM   metoprolol succinate  50 mg Oral Daily   Continuous Infusions:        Glade Lloyd, MD [facility] 11/30/2022, 7:54 AM

## 2022-11-30 NOTE — Progress Notes (Signed)
Taking over care of pt. Agree with previous RN assessment.

## 2022-11-30 NOTE — Progress Notes (Signed)
Rounding Note    Patient Name: Shannon Ortiz Date of Encounter: 11/30/2022   [facility] Cardiologist: Charlton Haws, MD   Subjective   No complaints walking in hall with PT  Inpatient Medications    Scheduled Meds:  amLODipine  10 mg Oral Daily   apixaban  2.5 mg Oral BID   atorvastatin  20 mg Oral Daily   docusate sodium  100 mg Oral BID   feeding supplement  237 mL Oral BID BM   losartan  100 mg Oral q AM   metoprolol succinate  50 mg Oral Daily   Continuous Infusions:  PRN Meds: acetaminophen **OR** acetaminophen, albuterol, ALPRAZolam, ondansetron **OR** ondansetron (ZOFRAN) IV, polyethylene glycol, traZODone   Vital Signs    Vitals:   11/29/22 0932 11/29/22 1708 11/29/22 2100 11/30/22 0938  BP: (!) 144/81 (!) 149/85 126/78 (!) 160/74  Pulse: 80  65 63  Resp: 16 18 19 20   Temp: 99 F (37.2 C) 97.8 F (36.6 C) 98.2 F (36.8 C) 98.1 F (36.7 C)  TempSrc: Oral Oral Oral Oral  SpO2: 98% 98% 98% 100%  Weight:      Height:        Intake/Output Summary (Last 24 hours) at 11/30/2022 1015 Last data filed at 11/30/2022 1610 Gross per 24 hour  Intake 710 ml  Output 0 ml  Net 710 ml      11/27/2022    5:30 AM 11/23/2022    8:09 AM 05/15/2022   10:50 AM  Last 3 Weights  Weight (lbs) 121 lb 11.1 oz 121 lb 120 lb 4.8 oz  Weight (kg) 55.2 kg 54.885 kg 54.568 kg      Telemetry    AV pacing SR - Personally Reviewed  ECG    No new - Personally Reviewed  Physical Exam  Per Dr. Eden Emms   GEN: No acute distress.   Neck: No JVD Cardiac: RRR, no murmurs, rubs, or gallops.  Respiratory: Clear to auscultation bilaterally. GI: Soft, nontender, non-distended  MS: No edema; No deformity. Neuro:  Nonfocal  Psych: Normal affect   Labs    High Sensitivity Troponin:   Recent Labs  Lab 11/23/22 0839 11/23/22 1140 11/23/22 1309 11/24/22 1046 11/24/22 1407  TROPONINIHS 158* 250* 330* 454* 442*     Chemistry Recent Labs  Lab 11/26/22 0441  11/26/22 0443 11/27/22 0415 11/29/22 0433  NA  --  131* 133* 133*  K  --  3.3* 3.4* 3.8  CL  --  101 102 101  CO2  --  21* 23 25  GLUCOSE  --  89 95 89  BUN  --  10 9 9   CREATININE  --  0.63 0.62 0.74  CALCIUM  --  8.5* 8.9 9.3  MG 1.9  --   --  1.9  GFRNONAA  --  >60 >60 >60  ANIONGAP  --  9 8 7     Lipids  Recent Labs  Lab 11/25/22 0827  CHOL 194  TRIG 80  HDL 76  LDLCALC 102*  CHOLHDL 2.6    Hematology Recent Labs  Lab 11/24/22 0424 11/26/22 0443 11/27/22 0415  WBC 8.6 4.1 4.3  RBC 3.70* 3.85* 3.91  HGB 9.0* 9.2* 9.4*  HCT 28.0* 28.1* 27.8*  MCV 75.7* 73.0* 71.1*  MCH 24.3* 23.9* 24.0*  MCHC 32.1 32.7 33.8  RDW 16.8* 15.9* 15.4  PLT 102* 146* 171   Thyroid No results for input(s): "TSH", "FREET4" in the last 168 hours.  BNPNo results  for input(s): "BNP", "PROBNP" in the last 168 hours.  DDimer No results for input(s): "DDIMER" in the last 168 hours.   Radiology    No results found.  Cardiac Studies   Echo 11/24/22  IMPRESSIONS     1. Left ventricular ejection fraction, by estimation, is 45 to 50%. The  left ventricle has mildly decreased function. The left ventricle has no  regional wall motion abnormalities. There is mild concentric left  ventricular hypertrophy. Left ventricular  diastolic parameters are consistent with Grade II diastolic dysfunction  (pseudonormalization).   2. Right ventricular systolic function is normal. The right ventricular  size is normal. There is moderately elevated pulmonary artery systolic  pressure. The estimated right ventricular systolic pressure is 52.2 mmHg.   3. Left atrial size was severely dilated.   4. Right atrial size was moderately dilated.   5. The mitral valve is grossly normal. Mild mitral valve regurgitation.  No evidence of mitral stenosis. There is mild holosystolic prolapse of the  middle scallop of the posterior leaflet of the mitral valve.   6. Tricuspid valve regurgitation is mild to moderate.    7. Focal calcification of the non coronary cusp . The aortic valve is  tricuspid. Aortic valve regurgitation is not visualized. Aortic valve  sclerosis/calcification is present, without any evidence of aortic  stenosis.   8. The inferior vena cava is dilated in size with <50% respiratory  variability, suggesting right atrial pressure of 15 mmHg.   Comparison(s): Changes from prior study are noted. The left ventricular  function is worsened. LVEF now 45-50%. Mild MR. Mild to moderate TR. RVSP  52 mmHG.   FINDINGS   Left Ventricle: Left ventricular ejection fraction, by estimation, is 45  to 50%. The left ventricle has mildly decreased function. The left  ventricle has no regional wall motion abnormalities. The left ventricular  internal cavity size was normal in  size. There is mild concentric left ventricular hypertrophy. Abnormal  (paradoxical) septal motion, consistent with RV pacemaker. Left  ventricular diastolic parameters are consistent with Grade II diastolic  dysfunction (pseudonormalization).   Right Ventricle: The right ventricular size is normal. No increase in  right ventricular wall thickness. Right ventricular systolic function is  normal. There is moderately elevated pulmonary artery systolic pressure.  The tricuspid regurgitant velocity is  3.05 m/s, and with an assumed right atrial pressure of 15 mmHg, the  estimated right ventricular systolic pressure is 52.2 mmHg.   Left Atrium: Left atrial size was severely dilated.   Right Atrium: Right atrial size was moderately dilated.   Pericardium: There is no evidence of pericardial effusion.   Mitral Valve: The mitral valve is grossly normal. There is mild  holosystolic prolapse of the middle scallop of the posterior leaflet of  the mitral valve. There is moderate thickening of the mitral valve  leaflet(s). Mild mitral valve regurgitation. No  evidence of mitral valve stenosis.   Tricuspid Valve: The tricuspid  valve is grossly normal. Tricuspid valve  regurgitation is mild to moderate. No evidence of tricuspid stenosis.   Aortic Valve: Focal calcification of the non coronary cusp. The aortic  valve is tricuspid. Aortic valve regurgitation is not visualized. Aortic  valve sclerosis/calcification is present, without any evidence of aortic  stenosis.   Pulmonic Valve: The pulmonic valve was grossly normal. Pulmonic valve  regurgitation is trivial. No evidence of pulmonic stenosis.   Aorta: The aortic root and ascending aorta are structurally normal, with  no evidence of dilitation.  Venous: The inferior vena cava is dilated in size with less than 50%  respiratory variability, suggesting right atrial pressure of 15 mmHg.   IAS/Shunts: There is right bowing of the interatrial septum, suggestive of  elevated left atrial pressure. The atrial septum is grossly normal.   Additional Comments: A device lead is visualized in the right atrium and  right ventricle.    Patient Profile     87 y.o. female PMH of sinus node dysfunction s/p PPM, HTN, atrial fibrillation on Eliquis, pancreatic CA who was seen for afib with RVR and elevated troponin. Note, patient was admitted with weakness and AMS, MRI showed acute CVA in watershed distribution   Assessment & Plan    PAF --- known history of PAF with low burden on previous device interrogation - arrived during this admission with afib with RVR. Converted to NSR on IVF and metoprolol.  - now on low dose eliquis 2.5 mg bid  Elevated troponin: likely related to tachycardia, trop trended up to 454.    Cardiomyopathy: EF 45-50% on echo 11/24/2022. Previous Echo in July 2020 showed EF 55-60%. No ARNI/ARB with ACEI allergy.   Pt is on losartan.    St Jude Pacemaker: placed on 03/06/2019. Last interrogation 10/30/2022 showed 1.6% afib burden. Now AV pacing in sinus    AMS with acute CVA: scattered subcentimeter acute ischemic nonhemorrhagic infarcts involving  the watershed distribution, favors hypoperfusion injury. CTA of head however suggest subocclusive thrombus in distal L M2/M3 junction.  Neurology following most likely SNF [facility].             For questions or updates, please contact [facility] Please consult [email or web address] for contact info under        Signed, Nada Boozer, NP  11/30/2022, 10:15 AM

## 2022-12-01 DIAGNOSIS — I4891 Unspecified atrial fibrillation: Secondary | ICD-10-CM | POA: Diagnosis not present

## 2022-12-01 MED ORDER — SENNOSIDES 8.8 MG/5ML PO SYRP
5.0000 mL | ORAL_SOLUTION | Freq: Every day | ORAL | Status: DC
  Administered 2022-12-01 – 2022-12-02 (×2): 5 mL via ORAL
  Filled 2022-12-01 (×2): qty 5

## 2022-12-01 NOTE — Progress Notes (Signed)
   Patient Name: Shannon Ortiz Date of Encounter: 12/01/2022 [facility] Cardiologist: Charlton Haws, MD   Interval Summary  .    87 y.o. female PMH of sinus node dysfunction s/p PPM, HTN, atrial fibrillation on Eliquis, pancreatic CA who was seen for afib with RVR and elevated troponin. Note, patient was admitted with weakness and AMS, MRI showed acute CVA in watershed distribution   Maintaining SR with PVCs short bursts of NSVT and AV pacing.  On going PT and OT  Vital Signs .    Vitals:   11/30/22 2035 12/01/22 0402 12/01/22 0600 12/01/22 0820  BP: 136/77 (!) 166/77 (!) 157/93 (!) 145/77  Pulse: 66 63    Resp: 16 16 18    Temp: 97.8 F (36.6 C) 98.1 F (36.7 C)    TempSrc: Oral Axillary    SpO2: 99% 96%    Weight:      Height:        Intake/Output Summary (Last 24 hours) at 12/01/2022 0956 Last data filed at 12/01/2022 0326 Gross per 24 hour  Intake 50 ml  Output 350 ml  Net -300 ml      11/27/2022    5:30 AM 11/23/2022    8:09 AM 05/15/2022   10:50 AM  Last 3 Weights  Weight (lbs) 121 lb 11.1 oz 121 lb 120 lb 4.8 oz  Weight (kg) 55.2 kg 54.885 kg 54.568 kg      Telemetry/ECG    SR and AV pacing also NSVT short bursts 3-4 beats No EKG - Personally Reviewed  Physical Exam .   GEN: No acute distress.  Is not verbal  Neck: No JVD Cardiac: RRR, + murmur, no rubs, or gallops.  Respiratory: Clear to auscultation bilaterally. GI: Soft, nontender, non-distended  MS: No edema  Assessment & Plan .      PAF --- known history of PAF with low burden on previous device interrogation - arrived during this admission with afib with RVR. Converted to NSR on IVF and metoprolol.  - now on low dose eliquis 2.5 mg bid continue   Elevated troponin: likely related to tachycardia, trop trended up to 454.    Cardiomyopathy: EF 45-50% on echo 11/24/2022. Previous Echo in July 2020 showed EF 55-60%. No ARNI/ARB with ACEI allergy.   Pt is on losartan.    St Jude Pacemaker:  placed on 03/06/2019. Last interrogation 10/30/2022 showed 1.6% afib burden. Now AV pacing in sinus  does have NSVT at times   AMS with acute CVA: scattered subcentimeter acute ischemic nonhemorrhagic infarcts involving the watershed distribution, favors hypoperfusion injury. CTA of head however suggest subocclusive thrombus in distal L M2/M3 junction.  Neurology following most likely SNF at [facility].  Has PT OT and speech.    For questions or updates, please contact [facility] Please consult [email or web address] for contact info under        Signed, Nada Boozer, NP

## 2022-12-01 NOTE — Progress Notes (Signed)
Speech Language Pathology Treatment: Dysphagia  Patient Details Name: Shannon Ortiz MRN: [id number] DOB: 26-Jun-1935 Today's Date: 12/01/2022 Time: 2130-8657 SLP Time Calculation (min) (ACUTE ONLY): 20 min  Assessment / Plan / Recommendation Clinical Impression  Patient seen by SLP for skilled treatment focused on dysphagia goals. Her grandson was in the room for first portion of session. He has been carefully feeding patient small amounts at a time and cueing her to swallow and told SLP that he observed her to initiate a swallow faster when he gave her sips of cold water. Patient was awake, alert and more interactive today as compared to yesterday and grandson also reported this positive change. She would make appropriate eye contact with speaker and would verbally respond, "uh huh" and overall her pragmatic language abilities have improved. SLP assisted patient with PO trials of puree solids on meal tray that arrived during session. She accepted a few bites of creamed potatoes and puree corn. She continues to exhibit delays in oral transit and swallow initiation, with laryngeal pumping observed to occur before swallow initiated. She did require verbal cues to swallow when she lost her focus on what she was doing. Frequent instances of throat clearing observed which were mild in intensity and duration. Patient reached for items on tray at times and she was able to hold utensil and cup with SLP providing HOH assist to support and to direct to her mouth. She started to appear to get drowsy and said, "no" when asked if she wanted more food. Oral cavity was clear after PO's. SLP recommends continuing with puree solids, thin liquids with full, close supervision and assist from staff or trained caregiver. SLP will continue to follow.   HPI HPI: 87 y.o. female with medical history pancreatic ca, cardiac issues, adm with nonspecific weakness x few weeks, found to be in rapid atrial fibrillation. MRI brain:  Scattered subcentimeter acute ischemic nonhemorrhagic infarcts involving the bilateral cerebral and cerebellar hemispheres, watershed in distribution. Swallow and speech/language evaluation completed with full liquids recommended per MBS d/t timing concerns; severe oral phase dysphagia.  Aphasia tx recommended.  ST f/u for aphasia/swallow tx.      SLP Plan  Continue with current plan of care      Recommendations for follow up therapy are one component of a multi-disciplinary discharge planning process, led by the attending physician.  Recommendations may be updated based on patient status, additional functional criteria and insurance authorization.    Recommendations  Diet recommendations: Thin liquid;Dysphagia 1 (puree) Liquids provided via: Cup;Straw Medication Administration: Crushed with puree Supervision: Full supervision/cueing for compensatory strategies;Trained caregiver to feed patient;Staff to assist with self feeding Compensations: Slow rate;Small sips/bites;Follow solids with liquid Postural Changes and/or Swallow Maneuvers: Seated upright 90 degrees;Upright 30-60 min after meal                  Oral care BID   Frequent or constant Supervision/Assistance Dysphagia, oropharyngeal phase (R13.12)     Continue with current plan of care    Angela Nevin, MA, CCC-SLP Speech Therapy

## 2022-12-01 NOTE — Progress Notes (Signed)
PROGRESS NOTE    [place] Brye  ZOX:[id number] DOB: [birth year] DOA: 11/23/2022 PCP: Daisy Floro, MD   Brief Narrative:  87 y.o. female with medical history significant for sinus node dysfunction with pacemaker, hypertension, atrial fibrillation on Eliquis and pancreatic cancer undergoing surveillance was admitted for nonspecific weakness and altered mental status and was found to be in rapid A-fib.  CT head was unremarkable on presentation.  She received IV metoprolol in the ED and converted back to normal sinus rhythm.  She had significantly elevated troponin, cardiology was consulted.  MRI of the brain showed acute CVA.  Neurology was consulted.  Palliative care also consulted for goals of care discussion.  PT recommended SNF/CIR placement.  Assessment & Plan:   Acute CVA Dysphagia: Most likely neurogenic from acute CVA -MRI showed multiple scattered bilateral cerebral and cerebellar hemisphere ischemic infarcts -Echo showed EF of 45 to 50%.  Carotid Doppler negative. -Neurology follow-up appreciated: Signed off on 11/29/2022.  Currently on Eliquis.   -LDL 102.  On Lipitor 20 mg daily as per neurology.  A1c 4.9. -Diet as per SLP recommendations.  Oral intake is not appropriate yet. -PT/OT recommending SNF/CIR placement.  Family interested in SNF placement.  TOC following.  Paroxysmal A-fib with RVR -Currently rate controlled.  Converted to normal sinus rhythm after IV fluids and metoprolol.  Continue Eliquis and metoprolol succinate.  Cardiology following  Cardiomyopathy -Echo showed EF of 45 to 50% during this hospitalization.  Previous echo in July 2020 showed EF of 55 to 60%.  Strict input and output.  Daily weights.  Continue metoprolol succinate and losartan.  Cardiology following.  No signs of volume overload at this time.  Elevated troponins -Likely related to tachycardia.  Troponins trended to 454.  No chest pain.  No further intervention needed as per  cardiology.  History of pacemaker placement for sinus node dysfunction -Cardiology following.  Hypertension -Monitor blood pressure.  Continue amlodipine, losartan and metoprolol succinate.  No need for permissive hypertension as per neurology.  Severe malnutrition Poor oral intake -Encourage oral intake.  Follow nutrition recommendations  Microcytic anemia -Questionable cause.  Hemoglobin stable.  Monitor intermittently  Thrombocytopenia -Resolved  Leukocytosis -Resolved  Hyponatremia -Mild.  Sodium 133 on 11/29/2022.  Monitor intermittently  Hypokalemia -Improved.  Goals of care -Overall prognosis is guarded to poor because of recent CVA and dysphagia and inability to speak with poor oral intake.  Palliative care following.  Remains full code.   DVT prophylaxis: Eliquis Code Status: Full Family Communication: Grandson at bedside Disposition Plan: Status is: Inpatient Remains inpatient appropriate because: Of severity of illness.  Need for SNF placement    Consultants: Cardiology/neurology/palliative care  Procedures: Echo  Antimicrobials: None   Subjective: Patient seen and examined at bedside.  Poor historian: Following commands; hardly able to speak.  No agitation, seizures, vomiting reported.   Objective: Vitals:   11/30/22 1600 11/30/22 2035 12/01/22 0402 12/01/22 0600  BP: 137/80 136/77 (!) 166/77 (!) 157/93  Pulse:  66 63   Resp: 18 16 16 18   Temp:  97.8 F (36.6 C) 98.1 F (36.7 C)   TempSrc:  Oral Axillary   SpO2:  99% 96%   Weight:      Height:        Intake/Output Summary (Last 24 hours) at 12/01/2022 0822 Last data filed at 12/01/2022 0326 Gross per 24 hour  Intake 290 ml  Output 350 ml  Net -60 ml    Filed Weights   11/23/22  0809 11/27/22 0530  Weight: 54.9 kg 55.2 kg    Examination:  General: On room air currently.  No distress.  Chronically ill and deconditioned looking.  Elderly female lying in bed. ENT/neck: No obvious  thyromegaly or JVD elevation noted respiratory: Bilateral decreased breath sounds at bases with scattered crackles CVS: S1-S2 heard; rate currently controlled Abdominal: Soft, nontender, distended mildly; no organomegaly, normal bowel sounds are heard  extremities: Bilateral lower extremity edema is present; no cyanosis  CNS: Awake; follows some commands but continues to be aphasic  lymph: No obvious lymphadenopathy is palpable  skin: No obvious ecchymosis/ulcers  psych: Shows no signs of agitation currently.  Intermittently smiles Musculoskeletal: No obvious joint deformity/swelling   Data Reviewed: I have personally reviewed following labs and imaging studies  CBC: Recent Labs  Lab 11/26/22 0443 11/27/22 0415  WBC 4.1 4.3  HGB 9.2* 9.4*  HCT 28.1* 27.8*  MCV 73.0* 71.1*  PLT 146* 171    Basic Metabolic Panel: Recent Labs  Lab 11/26/22 0441 11/26/22 0443 11/27/22 0415 11/29/22 0433  NA  --  131* 133* 133*  K  --  3.3* 3.4* 3.8  CL  --  101 102 101  CO2  --  21* 23 25  GLUCOSE  --  89 95 89  BUN  --  10 9 9   CREATININE  --  0.63 0.62 0.74  CALCIUM  --  8.5* 8.9 9.3  MG 1.9  --   --  1.9    GFR: Estimated Creatinine Clearance: 45.6 mL/min (by C-G formula based on SCr of 0.74 mg/dL). Liver Function Tests: No results for input(s): "AST", "ALT", "ALKPHOS", "BILITOT", "PROT", "ALBUMIN" in the last 168 hours.  No results for input(s): "LIPASE", "AMYLASE" in the last 168 hours. No results for input(s): "AMMONIA" in the last 168 hours. Coagulation Profile: No results for input(s): "INR", "PROTIME" in the last 168 hours. Cardiac Enzymes: No results for input(s): "CKTOTAL", "CKMB", "CKMBINDEX", "TROPONINI" in the last 168 hours. BNP (last 3 results) No results for input(s): "PROBNP" in the last 8760 hours. HbA1C: No results for input(s): "HGBA1C" in the last 72 hours. CBG: No results for input(s): "GLUCAP" in the last 168 hours.  Lipid Profile: No results for  input(s): "CHOL", "HDL", "LDLCALC", "TRIG", "CHOLHDL", "LDLDIRECT" in the last 72 hours. Thyroid Function Tests: No results for input(s): "TSH", "T4TOTAL", "FREET4", "T3FREE", "THYROIDAB" in the last 72 hours. Anemia Panel: No results for input(s): "VITAMINB12", "FOLATE", "FERRITIN", "TIBC", "IRON", "RETICCTPCT" in the last 72 hours. Sepsis Labs: No results for input(s): "PROCALCITON", "LATICACIDVEN" in the last 168 hours.  Recent Results (from the past 240 hour(s))  SARS Coronavirus 2 by RT PCR (hospital order, performed in [facility] hospital lab) *cepheid single result test* Anterior Nasal Swab     Status: None   Collection Time: 11/23/22  9:04 AM   Specimen: Anterior Nasal Swab  Result Value Ref Range Status   SARS Coronavirus 2 by RT PCR NEGATIVE NEGATIVE Final    Comment: (NOTE) SARS-CoV-2 target nucleic acids are NOT DETECTED.  The SARS-CoV-2 RNA is generally detectable in upper and lower respiratory specimens during the acute phase of infection. The lowest concentration of SARS-CoV-2 viral copies this assay can detect is 250 copies / mL. A negative result does not preclude SARS-CoV-2 infection and should not be used as the sole basis for treatment or other patient management decisions.  A negative result may occur with improper specimen collection / handling, submission of specimen other than nasopharyngeal swab,  presence of viral mutation(s) within the areas targeted by this assay, and inadequate number of viral copies (<250 copies / mL). A negative result must be combined with clinical observations, patient history, and epidemiological information.  Fact Sheet for Patients:   RoadLapTop.co.za  Fact Sheet for Healthcare Providers: http://kim-miller.com/  This test is not yet approved or  cleared by the [place] FDA and has been authorized for detection and/or diagnosis of SARS-CoV-2 by FDA under an Emergency Use  Authorization (EUA).  This EUA will remain in effect (meaning this test can be used) for the duration of the COVID-19 declaration under Section 564(b)(1) of the Act, 21 U.S.C. section 360bbb-3(b)(1), unless the authorization is terminated or revoked sooner.  Performed at [facility], [address], [place], [place] [address]          Radiology Studies: No results found.      Scheduled Meds:  amLODipine  10 mg Oral Daily   apixaban  2.5 mg Oral BID   atorvastatin  20 mg Oral Daily   docusate sodium  100 mg Oral BID   feeding supplement  237 mL Oral BID BM   losartan  100 mg Oral q AM   metoprolol succinate  50 mg Oral Daily   multivitamin with minerals  1 tablet Oral Daily   Continuous Infusions:        Glade Lloyd, MD [facility] 12/01/2022, 8:22 AM

## 2022-12-01 NOTE — TOC Progression Note (Addendum)
Transition of Care [facility]) - Progression Note    Patient Details  Name: Shannon Ortiz MRN: [id number] Date of Birth: 06-04-1936  [facility] CM/SW Contact  Larrie Kass, LCSW Phone Number: 12/01/2022, 10:00 AM  Clinical Narrative:    Pt's insurance [employer] was approved for SNF placement. TOC to follow    [facility] CSW attempted to contact pt's daughter beverly , no answer, unable to leave VM. TOC to follow.  Expected Discharge Plan: Skilled Nursing Facility Barriers to Discharge: Continued Medical Work up  Expected Discharge Plan and Services       Living arrangements for the past 2 months: Single Family Home                                       Social Determinants of Health (SDOH) Interventions SDOH Screenings   Food Insecurity: No Food Insecurity (11/23/2022)  Housing: Patient Declined (11/23/2022)  Transportation Needs: No Transportation Needs (11/23/2022)  Utilities: Not At Risk (11/23/2022)  Tobacco Use: Low Risk  (11/30/2022)    Readmission Risk Interventions     No data to display

## 2022-12-01 NOTE — Plan of Care (Signed)
  Problem: Clinical Measurements: Goal: Will remain free from infection Outcome: Progressing Goal: Diagnostic test results will improve Outcome: Progressing Goal: Cardiovascular complication will be avoided Outcome: Progressing   

## 2022-12-02 DIAGNOSIS — E43 Unspecified severe protein-calorie malnutrition: Secondary | ICD-10-CM | POA: Insufficient documentation

## 2022-12-02 DIAGNOSIS — I6320 Cerebral infarction due to unspecified occlusion or stenosis of unspecified precerebral arteries: Secondary | ICD-10-CM

## 2022-12-02 DIAGNOSIS — R410 Disorientation, unspecified: Secondary | ICD-10-CM | POA: Diagnosis not present

## 2022-12-02 DIAGNOSIS — I4891 Unspecified atrial fibrillation: Secondary | ICD-10-CM | POA: Diagnosis not present

## 2022-12-02 NOTE — Progress Notes (Signed)
Occupational Therapy Treatment Patient Details Name: Shannon Ortiz MRN: [id number] DOB: 1936/01/31 Today's Date: 12/02/2022   History of present illness 87 y.o. female with PMH of sinus node dysfunction s/p PPM, HTN, atrial fibrillation on Eliquis, thyroid and pancreatic CA who was seen for afib with RVR and elevated troponin. 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  R>L Hemiparesis   OT comments  The pt was seen for ADL instruction & re-training, specifically for upper body dressing, grooming, and self-feeding seated in the bedside chair. Her grandson and his fiance' were present throughout the session. The pt was noted to have both expressive and receptive cognitive communication deficits. She subsequently required increased verbal and tactile cues for initiation, sequencing, and problem solving for most all tasks. She required 2 person assist for supine to sit, as well as to stand using a RW and to stand-pivot to the bedside chair. She further needed frequent redirection to her R side, due to possible neglect vs. visual disturbance. She will continue to benefit from OT services to facilitate improved ADL performance and to decrease the risk for restricted participation in meaningful activities.    Recommendations for follow up therapy are one component of a multi-disciplinary discharge planning process, led by the attending physician.  Recommendations may be updated based on patient status, additional functional criteria and insurance authorization.    Assistance Recommended at Discharge  Frequent or Constant  Patient can return home with the following  Two people to help with walking and/or transfers;A lot of help with bathing/dressing/bathroom;A lot of help with walking and/or transfers;Direct supervision/assist for medications management   Equipment Recommendations  Other (comment) (to be determined pending progress at next setting)        Precautions / Restrictions Precautions Precautions: Fall Precaution Comments: new CVA Restrictions Weight Bearing Restrictions: No       Mobility Bed Mobility Overal bed mobility: Needs Assistance Bed Mobility: Supine to Sit     Supine to sit: Max assist, +2 for physical assistance, +2 for safety/equipment          Transfers Overall transfer level: Needs assistance Equipment used: Rolling [place] (2 wheels) Transfers: Sit to/from Stand Sit to Stand: Mod assist, +2 physical assistance, +2 safety/equipment, From elevated surface     Step pivot transfers: Max assist, +2 physical assistance, +2 safety/equipment     General transfer comment: She initially presented with pronounced posterior lean and required increased assist and cues to correct. She subsequently required tactile and verbal cues to sequence transfer to chair, including RW advancement and positioning, lifting BLE off the floor and sequencing steps.         ADL either performed or assessed with clinical judgement   ADL Overall ADL's : Needs assistance/impaired Eating/Feeding: Sitting;Maximal assistance Eating/Feeding Details (indicate cue type and reason): OT provided re-training for self-feeding with the pt seated in the chair. She required hand over hand assist, cues to attend to the R side to access the cup she was drinking from, and cues for initation of task. Grooming: Maximal assistance;Sitting Grooming Details (indicate cue type and reason): OT provided hand-over-hand assist for performing hand face washing and applying lip moisturizer. Hand over hand assist was provided with the pt's affected RUE. She further required tactile and verbal cues for initiation and thoroughness with task.         Upper Body Dressing : Moderate assistance;Sitting Upper Body Dressing Details (indicate cue type and reason): OT provided ADL re-training  for UBD seated in the bedside chair. She required increased verbal and  tactile cues to doff a hospital gown, then to donn another one; cues were required for general sequencing, attention, and use of BUE. Lower Body Dressing: Maximal assistance Lower Body Dressing Details (indicate cue type and reason): Based on clinical judgement                      Cognition Arousal/Alertness: Awake/alert Behavior During Therapy: Flat affect Overall Cognitive Status: Impaired/Different from baseline Area of Impairment: Following commands, Problem solving          Following Commands: Follows one step commands inconsistently     Problem Solving: Slow processing, Decreased initiation, Difficulty sequencing, Requires verbal cues, Requires tactile cues                     Pertinent Vitals/ Pain       Pain Assessment Pain Assessment: Faces Pain Score: 0-No pain   Frequency  Min 1X/week        Progress Toward Goals  OT Goals(current goals can now be found in the care plan section)     Acute Rehab OT Goals OT Goal Formulation: Patient unable to participate in goal setting Time For Goal Achievement: 12/11/22 Potential to Achieve Goals: Fair  Plan Discharge plan remains appropriate       AM-PAC OT "6 Clicks" Daily Activity     Outcome Measure   Help from another person eating meals?: A Lot Help from another person taking care of personal grooming?: A Lot Help from another person toileting, which includes using toliet, bedpan, or urinal?: A Lot Help from another person bathing (including washing, rinsing, drying)?: A Lot Help from another person to put on and taking off regular upper body clothing?: A Lot Help from another person to put on and taking off regular lower body clothing?: A Lot 6 Click Score: 12    End of Session Equipment Utilized During Treatment: Gait belt;Rolling Adames (2 wheels)  OT Visit Diagnosis: Hemiplegia and hemiparesis;Muscle weakness (generalized) (M62.81);Cognitive communication deficit (R41.841);Other symptoms  and signs involving cognitive function;Feeding difficulties (R63.3) Symptoms and signs involving cognitive functions: Cerebral infarction Hemiplegia - dominant/non-dominant: Dominant   Activity Tolerance Patient tolerated treatment well   Patient Left in chair;with call bell/phone within reach;with chair alarm set;with family/visitor present   Nurse Communication Other (comment)        Time: 4098-1191 OT Time Calculation (min): 37 min  Charges: OT General Charges $OT Visit: 1 Visit OT Treatments $Self Care/Home Management : 8-22 mins $Therapeutic Activity: 8-22 mins      Reuben Likes, OTR/L 12/02/2022, 3:20 PM

## 2022-12-02 NOTE — Progress Notes (Signed)
Physical Therapy Treatment Patient Details Name: Shannon Ortiz MRN: [id number] DOB: Aug 19, 1935 Today's Date: 12/02/2022   History of Present Illness 87 y.o. female with PMH of sinus node dysfunction s/p PPM, HTN, atrial fibrillation on Eliquis, thyroid and pancreatic CA who was seen for afib with RVR and elevated troponin. 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  R>L Hemiparesis    PT Comments    General Comments: globally aphasic, mostly non verbal.  improved eye tracking and engaging.  smiles and mimics.  Increased drooling this session.  GrandSon in room and VERY attentive/helpful.  Pt was already OOB in recliner.  General transfer comment: required Max Assist + 2 to rise from recliner.  Severe posterior RIGHT lean.  R UE remains partially flaccid.  Exhibits trace active movement and poor grip.  Required hand over hand assist to transfer to [place]. General Gait Details: improved gait distance 12 feet then another 14 feet with a seated rest break.  Requires + 2 Max Assist present with severe right posterior lean.  Noted increased self abilty to advance R LE without assist from Therapist.  GrandSon assisted by supporting pt in front and encouraging her to correct posture and increase her distance.  Recliner following behind for safety. Positioned in recliner to comfort to correct RIGHT lean.   Pt will need ST Rehab at SNF to address mobility and functional decline prior to safely returning home.   Recommendations for follow up therapy are one component of a multi-disciplinary discharge planning process, led by the attending physician.  Recommendations may be updated based on patient status, additional functional criteria and insurance authorization.  Follow Up Recommendations  Can patient physically be transported by private vehicle: No    Assistance Recommended at Discharge Intermittent Supervision/Assistance  Patient can return home with the  following A lot of help with walking and/or transfers;A lot of help with bathing/dressing/bathroom;Two people to help with walking and/or transfers;Two people to help with bathing/dressing/bathroom   Equipment Recommendations  None recommended by PT    Recommendations for Other Services       Precautions / Restrictions Precautions Precautions: Fall Precaution Comments: new CVA Restrictions Weight Bearing Restrictions: No     Mobility  Bed Mobility               General bed mobility comments: OOB in recliner    Transfers Overall transfer level: Needs assistance Equipment used: Rolling [place] (2 wheels) Transfers: Sit to/from Stand Sit to Stand: Max assist, +2 physical assistance, +2 safety/equipment           General transfer comment: required Max Assist + 2 to rise from recliner.  Severe posterior RIGHT lean.  R UE remains partially flaccid.  Exhibits trace active movement and poor grip.  Required hand over hand assist to transfer to [place].    Ambulation/Gait Ambulation/Gait assistance: Max assist, +2 physical assistance, +2 safety/equipment Gait Distance (Feet): 26 Feet (12 feet, 14 feet) Assistive device: Rolling Demarco (2 wheels) Gait Pattern/deviations: Step-to pattern, Decreased stance time - right, Drifts right/left Gait velocity: decreased     General Gait Details: improved gait distance 12 feet then another 14 feet with a seated rest break.  Requires + 2 Max Assist present with severe right posterior lean.  Noted increased self abilty to advance R LE without assist from Therapist.  GrandSon assisted by supporting pt in front and encouraging her to correct posture and increase her distance.  Recliner following behind for  safety.   Stairs             Wheelchair Mobility    Modified Rankin (Stroke Patients Only)       Balance                                            Cognition Arousal/Alertness: Awake/alert Behavior  During Therapy: Flat affect (mostly non verbal) Overall Cognitive Status: Impaired/Different from baseline Area of Impairment: Following commands, Problem solving                       Following Commands: Follows one step commands consistently       General Comments: globally aphasic, improved eye tracking and engaging.  smiles and mimics.  Increased drooling this session.        Exercises      General Comments        Pertinent Vitals/Pain Pain Assessment Pain Assessment: No/denies pain    Home Living                          Prior Function            PT Goals (current goals can now be found in the care plan section) Progress towards PT goals: Progressing toward goals    Frequency    Min 1X/week      PT Plan Current plan remains appropriate    Co-evaluation              AM-PAC PT "6 Clicks" Mobility   Outcome Measure  Help needed turning from your back to your side while in a flat bed without using bedrails?: A Lot Help needed moving from lying on your back to sitting on the side of a flat bed without using bedrails?: A Lot Help needed moving to and from a bed to a chair (including a wheelchair)?: A Lot Help needed standing up from a chair using your arms (e.g., wheelchair or bedside chair)?: A Lot Help needed to walk in hospital room?: A Lot Help needed climbing 3-5 steps with a railing? : Total 6 Click Score: 11    End of Session Equipment Utilized During Treatment: Gait belt Activity Tolerance: Patient limited by fatigue Patient left: in chair;with call bell/phone within reach;with chair alarm set;with family/visitor present Nurse Communication: Mobility status PT Visit Diagnosis: Other abnormalities of gait and mobility (R26.89);Difficulty in walking, not elsewhere classified (R26.2)     Time: 1415-1440 PT Time Calculation (min) (ACUTE ONLY): 25 min  Charges:  $Gait Training: 8-22 mins $Therapeutic Activity: 8-22  mins                     Felecia Shelling  PTA Acute  Rehabilitation Services Office M-F          [phone]

## 2022-12-02 NOTE — Progress Notes (Signed)
[facility]  PROGRESS NOTE  Shannon Ortiz ZOX:[id number] DOB: 1936/05/13 DOA: 11/23/2022 PCP: Daisy Floro, MD   Brief HPI:   87 y.o. female with medical history significant for sinus node dysfunction with pacemaker, hypertension, atrial fibrillation on Eliquis and pancreatic cancer undergoing surveillance was admitted for nonspecific weakness and altered mental status and was found to be in rapid A-fib.  CT head was unremarkable on presentation.  She received IV metoprolol in the ED and converted back to normal sinus rhythm.  She had significantly elevated troponin, cardiology was consulted.  MRI of the brain showed acute CVA.  Neurology was consulted.  Palliative care also consulted for goals of care discussion.  PT recommended SNF/CIR placement.      Assessment/Plan:   Acute CVA -Patient presented with altered mental status -CT head was unremarkable, MRI showed multiple scattered bilateral cerebral and cerebellar hemisphere ischemic infarcts -Neurology was consulted, likely cardioembolic from atrial fibrillation  -CT head and neck showed focal filling defect at distal left M2/M3 junction consistent with subocclusive thrombus, she was on heparin, switch back to Eliquis -Speech therapy saw the patient, patient started on dysphagia 1 diet -Palliative care consulted for goals of care, no PEG tube placement at this time -Patient to go to skilled nursing facility for rehab  Paroxysmal atrial fibrillation with RVR -Converted to normal sinus rhythm after IV fluids and metoprolol -Continue Eliquis -Cardiology has signed off  Cardiomyopathy -Echocardiogram showed EF of 45 to 50%, previous echo in July 2020 showed EF of 55 to 60% -Continue metoprolol and losartan -Cardiology has signed off  Troponin elevation -Likely demand ischemia due to sinus tachycardia -No chest pain -No further intervention recommended per cardiology  History of pacemaker placement for sinus node  dysfunction -Stable, back to paced rhythm   Hypertension -Monitor blood pressure.   Continue amlodipine, losartan and metoprolol succinate.   No need for permissive hypertension as per neurology.   Severe malnutrition Poor oral intake -Encourage oral intake.   Follow nutrition recommendations   Microcytic anemia -Questionable cause.   Hemoglobin stable.  Monitor intermittently   Thrombocytopenia -Resolved   Leukocytosis -Resolved   Hyponatremia -Mild.      Hypokalemia -Improved.   Goals of care -Overall prognosis is guarded to poor because of recent CVA and dysphagia and inability to speak with poor oral intake.  Palliative care following.  Remains full code.  Family has refused PEG tube placement    Medications     amLODipine  10 mg Oral Daily   apixaban  2.5 mg Oral BID   atorvastatin  20 mg Oral Daily   docusate sodium  100 mg Oral BID   feeding supplement  237 mL Oral BID BM   losartan  100 mg Oral q AM   metoprolol succinate  50 mg Oral Daily   multivitamin with minerals  1 tablet Oral Daily   sennosides  5 mL Oral QHS     Data Reviewed:   CBG:  No results for input(s): "GLUCAP" in the last 168 hours.  SpO2: 98 %    Vitals:   12/01/22 2009 12/02/22 0406 12/02/22 0940 12/02/22 1256  BP: (!) 146/79 (!) 155/77 113/72   Pulse: 62 67 61   Resp: 16 17    Temp: 99.2 F (37.3 C) 98.6 F (37 C)  98.3 F (36.8 C)  TempSrc:  Axillary  Oral  SpO2: 100% 98%    Weight:      Height:  Data Reviewed:  Basic Metabolic Panel: Recent Labs  Lab 11/26/22 0441 11/26/22 0443 11/27/22 0415 11/29/22 0433  NA  --  131* 133* 133*  K  --  3.3* 3.4* 3.8  CL  --  101 102 101  CO2  --  21* 23 25  GLUCOSE  --  89 95 89  BUN  --  10 9 9   CREATININE  --  0.63 0.62 0.74  CALCIUM  --  8.5* 8.9 9.3  MG 1.9  --   --  1.9    CBC: Recent Labs  Lab 11/26/22 0443 11/27/22 0415  WBC 4.1 4.3  HGB 9.2* 9.4*  HCT 28.1* 27.8*  MCV 73.0* 71.1*  PLT  146* 171    LFT No results for input(s): "AST", "ALT", "ALKPHOS", "BILITOT", "PROT", "ALBUMIN" in the last 168 hours.   Antibiotics: Anti-infectives (From admission, onward)    None        DVT prophylaxis: Apixaban  Code Status: Full code  Family Communication: Discussed with patient's daughter at bedside   CONSULTS cardiology, neurology, palliative care   Subjective   Patient seen and examined, denies any complaints.  Tolerating dysphagia 1 diet   Objective    Physical Examination:   General: Appears in no acute distress Cardiovascular: S1-S2, regular Respiratory: Lungs clear to auscultation bilaterally Abdomen: Abdomen is soft, nontender, no organomegaly Extremities: No edema in the lower extremities Neurologic: Alert, oriented to place and self, moving all extremities   Status is: Inpatient:          Meredeth Ide   [facility] If 7PM-7AM, please contact night-coverage at [email or web address], Office  [phone]   12/02/2022, 4:29 PM  LOS: 8 days

## 2022-12-02 NOTE — TOC Progression Note (Signed)
Transition of Care [facility]) - Progression Note    Patient Details  Name: Shannon Ortiz MRN: [id number] Date of Birth: [birth year]  [facility] CM/SW Contact  Larrie Kass, LCSW Phone Number: 12/02/2022, 2:53 PM  Clinical Narrative:    CSW spoke with pt's daughter Ronn Melena, she reports she does not believe her mother is ready to leave the hospital. CSW tried to explain that the patient's insurance authorization for [employer] will expire today, and the patient may lose her bed at the facility. Pt's daughter became upset and stated "We would just appeal this." CSW tried to explain the process, however, pt's daughter cut CSW off and asked the same question about appealing, when CSW attempted to answer, Pt's daughter stated "Why do you keep talking over me?" CSW listened to pt's daughter express her medical concerns. CSW tried to explain, that she would have to speak with the MD about anything medical but was cut off. Pt's daughter then asked if this CSW was in the hospital, CSW replied yes, then she stated she was on her way to the hospital and disconnected the call.    CSW received a message from [facility] they reported pt's insurance Berkley Harvey is good until tomorrow. Pt can d/c to the facility tomorrow. CSW received message from pt's RN, pt's family has requested to speak with the facility. CSW provided admission coordinator's contact information. TOC to follow.     Expected Discharge Plan: Skilled Nursing Facility Barriers to Discharge: Continued Medical Work up  Expected Discharge Plan and Services       Living arrangements for the past 2 months: Single Family Home                                       Social Determinants of Health (SDOH) Interventions SDOH Screenings   Food Insecurity: No Food Insecurity (11/23/2022)  Housing: Patient Declined (11/23/2022)  Transportation Needs: No Transportation Needs (11/23/2022)  Utilities: Not At Risk  (11/23/2022)  Tobacco Use: Low Risk  (11/30/2022)    Readmission Risk Interventions     No data to display

## 2022-12-02 NOTE — Plan of Care (Signed)
  Problem: Education: Goal: Knowledge of General Education information will improve Description: Including pain rating scale, medication(s)/side effects and non-pharmacologic comfort measures Outcome: Not Progressing   Problem: Health Behavior/Discharge Planning: Goal: Ability to manage health-related needs will improve Outcome: Not Progressing   

## 2022-12-02 NOTE — Progress Notes (Signed)
Speech Language Pathology Treatment: Dysphagia  Patient Details Name: Nakyla Ladewig MRN: [id number] DOB: 1936/04/22 Today's Date: 12/02/2022 Time: 1010-1030 SLP Time Calculation (min) (ACUTE ONLY): 20 min  Assessment / Plan / Recommendation Clinical Impression  Patient seen by SLP for skilled treatment focused on dysphagia goals. Patient's nephew present in room and he had been helping her with some PO's. Patient awake and alert and interactive as she was during yesterday's session. She was able to hold small medicine cup of water in her left hand and give self cup sips with supervision assist to perform and min assist to maintain attention to keep holding cup when she was not drinking. She was not able to achieve adequate grip on spoon even with max hand over hand assist. She continues to exhibit oral holding with liquids and puree solids, swallow initiation delays and throat clearing response following swallows. (Throat clearing was decreased today as compared to previous day) SLP recommending continue with Dys 1 (puree), thin liquids and will continue to follow while admitted.   HPI HPI: 87 y.o. female with medical history pancreatic ca, cardiac issues, adm with nonspecific weakness x few weeks, found to be in rapid atrial fibrillation. MRI brain: Scattered subcentimeter acute ischemic nonhemorrhagic infarcts involving the bilateral cerebral and cerebellar hemispheres, watershed in distribution. Swallow and speech/language evaluation completed with full liquids recommended per MBS d/t timing concerns; severe oral phase dysphagia.  Aphasia tx recommended.  ST f/u for aphasia/swallow tx.      SLP Plan  Continue with current plan of care      Recommendations for follow up therapy are one component of a multi-disciplinary discharge planning process, led by the attending physician.  Recommendations may be updated based on patient status, additional functional criteria and insurance authorization.     Recommendations  Diet recommendations: Dysphagia 1 (puree);Thin liquid Liquids provided via: Cup;Straw Medication Administration: Crushed with puree Supervision: Full supervision/cueing for compensatory strategies;Trained caregiver to feed patient;Staff to assist with self feeding Compensations: Slow rate;Small sips/bites;Follow solids with liquid Postural Changes and/or Swallow Maneuvers: Seated upright 90 degrees;Upright 30-60 min after meal                  Oral care BID;Oral care before and after PO   Frequent or constant Supervision/Assistance Dysphagia, oropharyngeal phase (R13.12)     Continue with current plan of care     Angela Nevin, MA, CCC-SLP Speech Therapy

## 2022-12-03 DIAGNOSIS — R278 Other lack of coordination: Secondary | ICD-10-CM | POA: Diagnosis not present

## 2022-12-03 DIAGNOSIS — E875 Hyperkalemia: Secondary | ICD-10-CM | POA: Diagnosis not present

## 2022-12-03 DIAGNOSIS — I480 Paroxysmal atrial fibrillation: Secondary | ICD-10-CM | POA: Diagnosis not present

## 2022-12-03 DIAGNOSIS — B370 Candidal stomatitis: Secondary | ICD-10-CM | POA: Diagnosis not present

## 2022-12-03 DIAGNOSIS — Z743 Need for continuous supervision: Secondary | ICD-10-CM | POA: Diagnosis not present

## 2022-12-03 DIAGNOSIS — I4891 Unspecified atrial fibrillation: Secondary | ICD-10-CM | POA: Diagnosis not present

## 2022-12-03 DIAGNOSIS — E43 Unspecified severe protein-calorie malnutrition: Secondary | ICD-10-CM | POA: Diagnosis not present

## 2022-12-03 DIAGNOSIS — I639 Cerebral infarction, unspecified: Secondary | ICD-10-CM | POA: Diagnosis not present

## 2022-12-03 DIAGNOSIS — E119 Type 2 diabetes mellitus without complications: Secondary | ICD-10-CM | POA: Diagnosis not present

## 2022-12-03 DIAGNOSIS — Z950 Presence of cardiac pacemaker: Secondary | ICD-10-CM | POA: Diagnosis not present

## 2022-12-03 DIAGNOSIS — C253 Malignant neoplasm of pancreatic duct: Secondary | ICD-10-CM | POA: Diagnosis not present

## 2022-12-03 DIAGNOSIS — R404 Transient alteration of awareness: Secondary | ICD-10-CM | POA: Diagnosis not present

## 2022-12-03 DIAGNOSIS — I69391 Dysphagia following cerebral infarction: Secondary | ICD-10-CM | POA: Diagnosis not present

## 2022-12-03 DIAGNOSIS — R296 Repeated falls: Secondary | ICD-10-CM | POA: Diagnosis not present

## 2022-12-03 DIAGNOSIS — Z741 Need for assistance with personal care: Secondary | ICD-10-CM | POA: Diagnosis not present

## 2022-12-03 DIAGNOSIS — R1312 Dysphagia, oropharyngeal phase: Secondary | ICD-10-CM | POA: Diagnosis not present

## 2022-12-03 DIAGNOSIS — I10 Essential (primary) hypertension: Secondary | ICD-10-CM | POA: Diagnosis not present

## 2022-12-03 DIAGNOSIS — M6281 Muscle weakness (generalized): Secondary | ICD-10-CM | POA: Diagnosis not present

## 2022-12-03 DIAGNOSIS — Z7401 Bed confinement status: Secondary | ICD-10-CM | POA: Diagnosis not present

## 2022-12-03 DIAGNOSIS — R262 Difficulty in walking, not elsewhere classified: Secondary | ICD-10-CM | POA: Diagnosis not present

## 2022-12-03 DIAGNOSIS — M6259 Muscle wasting and atrophy, not elsewhere classified, multiple sites: Secondary | ICD-10-CM | POA: Diagnosis not present

## 2022-12-03 DIAGNOSIS — R410 Disorientation, unspecified: Secondary | ICD-10-CM | POA: Diagnosis not present

## 2022-12-03 MED ORDER — ATORVASTATIN CALCIUM 20 MG PO TABS: 20.0000 mg | ORAL_TABLET | Freq: Every day | ORAL | Status: DC

## 2022-12-03 MED ORDER — SENNOSIDES 8.8 MG/5ML PO SYRP: 5.0000 mL | ORAL_SOLUTION | Freq: Every day | ORAL | 0 refills | Status: DC

## 2022-12-03 MED ORDER — POLYETHYLENE GLYCOL 3350 17 G PO PACK: 17.0000 g | PACK | Freq: Every day | ORAL | 0 refills | Status: DC | PRN

## 2022-12-03 MED ORDER — ALPRAZOLAM 0.5 MG PO TABS: 0.5000 mg | ORAL_TABLET | Freq: Three times a day (TID) | ORAL | 0 refills | Status: DC | PRN

## 2022-12-03 NOTE — Plan of Care (Signed)
  Problem: Education: Goal: Knowledge of General Education information will improve Description: Including pain rating scale, medication(s)/side effects and non-pharmacologic comfort measures Outcome: Not Progressing   Problem: Health Behavior/Discharge Planning: Goal: Ability to manage health-related needs will improve Outcome: Not Progressing   

## 2022-12-03 NOTE — Discharge Summary (Signed)
Physician Discharge Summary   Patient: Shannon Ortiz MRN: [id number] DOB: 08/15/1935  Admit date:     11/23/2022  Discharge date: 12/03/22  Discharge Physician: Meredeth Ide   PCP: Daisy Floro, MD   Recommendations at discharge:    Diet recommendations: Dysphagia 1 (puree);Thin liquid Liquids provided via: Cup;Straw Medication Administration: Crushed with puree Supervision: Full supervision/cueing for compensatory strategies;Trained caregiver to feed patient;Staff to assist with self feeding Compensations: Slow rate;Small sips/bites;Follow solids with liquid Postural Changes and/or Swallow Maneuvers: Seated upright 90 degrees;Upright 30-60 min after meal  Check BMP in 3 days; patient is on potassium supplementation Continue Eliquis Continue speech therapy and physical therapy at skilled facility  Discharge Diagnoses: Principal Problem:   Atrial fibrillation with rapid ventricular response (HCC) Active Problems:   AMS (altered mental status)   Protein-calorie malnutrition, severe  Resolved Problems:   * No resolved hospital problems. *  Hospital Course: 87 y.o. female with medical history significant for sinus node dysfunction with pacemaker, hypertension, atrial fibrillation on Eliquis and pancreatic cancer undergoing surveillance was admitted for nonspecific weakness and altered mental status and was found to be in rapid A-fib.  CT head was unremarkable on presentation.  She received IV metoprolol in the ED and converted back to normal sinus rhythm.  She had significantly elevated troponin, cardiology was consulted.  MRI of the brain showed acute CVA.  Neurology was consulted.  Palliative care also consulted for goals of care discussion.  PT recommended SNF/CIR placement.    Assessment and Plan:  Acute CVA -Patient presented with altered mental status -CT head was unremarkable, MRI showed multiple scattered bilateral cerebral and cerebellar hemisphere ischemic  infarcts -Neurology was consulted, likely cardioembolic from atrial fibrillation  -CT head and neck showed focal filling defect at distal left M2/M3 junction consistent with subocclusive thrombus, she was on heparin, switch back to Eliquis -Speech therapy saw the patient, patient started on dysphagia 1 diet -Palliative care consulted for goals of care, no PEG tube placement at this time -Patient to go to skilled nursing facility for rehab   Paroxysmal atrial fibrillation with RVR -Converted to normal sinus rhythm after IV fluids and metoprolol -Continue Eliquis -Cardiology has signed off   Cardiomyopathy -Echocardiogram showed EF of 45 to 50%, previous echo in July 2020 showed EF of 55 to 60% -Continue metoprolol and losartan -Cardiology has signed off   Troponin elevation -Likely demand ischemia due to sinus tachycardia -No chest pain -No further intervention recommended per cardiology   History of pacemaker placement for sinus node dysfunction -Stable, back to paced rhythm   Hypertension -Monitor blood pressure.   Continue amlodipine, losartan and metoprolol succinate.   No need for permissive hypertension as per neurology.   Severe malnutrition Poor oral intake -Encourage oral intake.   -Patient to be discharged on dysphagia 1 diet as per speech therapy recommendation   Microcytic anemia -Questionable cause.   Hemoglobin stable.   Thrombocytopenia -Resolved   Leukocytosis -Resolved   Hyponatremia -Mild.       Hypokalemia -Improved. -Patient on potassium supplementation -Check BMP in 3 days   Goals of care -Overall prognosis is guarded to poor because of recent CVA and dysphagia and inability to speak with poor oral intake.  Palliative care was consulted.  Remains full code.  Family has refused PEG tube placement       Consultants: Palliative care, neurology, cardiology Procedures performed:  Disposition: Skilled nursing facility Diet recommendation:   Discharge Diet Orders (From admission, onward)  Start     Ordered   12/03/22 0000  Diet general       Comments: Diet recommendations: Dysphagia 1 (puree);Thin liquid Liquids provided via: Cup;Straw Medication Administration: Crushed with puree Supervision: Full supervision/cueing for compensatory strategies;Trained caregiver to feed patient;Staff to assist with self feeding Compensations: Slow rate;Small sips/bites;Follow solids with liquid Postural Changes and/or Swallow Maneuvers: Seated upright 90 degrees;Upright 30-60 min after meal   12/03/22 1038           Dysphagia type 1 thin Liquid DISCHARGE MEDICATION: Allergies as of 12/03/2022       Reactions   Ace Inhibitors Cough   Elavil [amitriptyline Hcl] Hives   Ketek [telithromycin] Hives        Medication List     STOP taking these medications    potassium chloride 20 MEQ packet Commonly known as: KLOR-CON       TAKE these medications    ALPRAZolam 0.5 MG tablet Commonly known as: XANAX Take 1 tablet (0.5 mg total) by mouth 3 (three) times daily as needed for anxiety.   amLODipine 10 MG tablet Commonly known as: NORVASC Take 1 tablet (10 mg total) by mouth daily. Please call our office to schedule an appt for future refills. Thank you 2nd attempt.   apixaban 2.5 MG Tabs tablet Commonly known as: ELIQUIS Take 1 tablet (2.5 mg total) by mouth 2 (two) times daily.   atorvastatin 20 MG tablet Commonly known as: LIPITOR Take 1 tablet (20 mg total) by mouth daily. Start taking on: December 04, 2022   docusate sodium 100 MG capsule Commonly known as: COLACE Take 100 mg by mouth 2 (two) times daily.   feeding supplement Liqd Take 237 mLs by mouth 2 (two) times daily between meals. What changed: when to take this   losartan 100 MG tablet Commonly known as: COZAAR Take 100 mg by mouth in the morning.   metoprolol succinate 50 MG 24 hr tablet Commonly known as: TOPROL-XL Take 50 mg by mouth in the  morning.   ondansetron 4 MG disintegrating tablet Commonly known as: ZOFRAN-ODT Take 4 mg by mouth every 8 (eight) hours as needed for nausea.   pantoprazole 40 MG tablet Commonly known as: PROTONIX Take 1 tablet (40 mg total) by mouth daily. What changed: Another medication with the same name was removed. Continue taking this medication, and follow the directions you see here.   polyethylene glycol 17 g packet Commonly known as: MIRALAX / GLYCOLAX Take 17 g by mouth daily as needed for mild constipation. What changed:  when to take this reasons to take this   potassium chloride SA 20 MEQ tablet Commonly known as: KLOR-CON M Take 1 tablet (20 mEq total) by mouth daily.   sennosides 8.8 MG/5ML syrup Commonly known as: SENOKOT Take 5 mLs by mouth at bedtime.        Contact information for follow-up providers     Marinus Maw, MD Follow up on 01/05/2023.   Specialty: Cardiology Why: at 11:00 AM please arrive 15 min early to check in Contact information: [address] [place] [address] [phone]              Contact information for after-discharge care     Destination     [facility] Preferred SNF .   Service: Skilled Paramedic information: [address] [place] [address] [phone]  Discharge Exam: Filed Weights   11/23/22 0809 11/27/22 0530  Weight: 54.9 kg 55.2 kg   Appears in no acute distress S1-S2, regular Lungs clear to auscultation bilaterally Abdomen is soft, nontender, no organomegaly  Condition at discharge: good  The results of significant diagnostics from this hospitalization (including imaging, microbiology, ancillary and laboratory) are listed below for reference.   Imaging Studies: VAS US CAROTID (at [facility] and [facility] only)  Result Date: 11/26/2022 Carotid Arterial Duplex Study Patient Name:  [facility]  Date of Exam:    11/25/2022 Medical Rec #: [id number]      Accession #:    [id number] Date of Birth: [birth year]      Patient Gender: F Patient Age:   89 years Exam Location:  [facility] Procedure:      VAS US CAROTID Referring Phys: Marlin Canary --------------------------------------------------------------------------------  Indications:       CVA. Risk Factors:      Hypertension. Other Factors:     Afib, PM. Comparison Study:  No previous exams Performing Technologist: Jody Hill RVT, RDMS  Examination Guidelines: A complete evaluation includes B-mode imaging, spectral Doppler, color Doppler, and power Doppler as needed of all accessible portions of each vessel. Bilateral testing is considered an integral part of a complete examination. Limited examinations for reoccurring indications may be performed as noted.  Right Carotid Findings: +----------+--------+--------+--------+------------------+------------------+           PSV cm/sEDV cm/sStenosisPlaque DescriptionComments           +----------+--------+--------+--------+------------------+------------------+ CCA Prox  41      14                                intimal thickening +----------+--------+--------+--------+------------------+------------------+ CCA Distal36      12              heterogenous      intimal thickening +----------+--------+--------+--------+------------------+------------------+ ICA Prox  38      17                                                   +----------+--------+--------+--------+------------------+------------------+ ICA Distal61      27                                tortuous           +----------+--------+--------+--------+------------------+------------------+ ECA       41      8                                                    +----------+--------+--------+--------+------------------+------------------+ +----------+--------+-------+----------------+-------------------+           PSV cm/sEDV  cmsDescribe        Arm Pressure (mmHG) +----------+--------+-------+----------------+-------------------+ [id number]             Multiphasic, WNL                    +----------+--------+-------+----------------+-------------------+ +---------+--------+--+--------+-+---------+ VertebralPSV cm/s19EDV cm/s8Antegrade +---------+--------+--+--------+-+---------+  Left Carotid Findings: +----------+--------+--------+--------+------------------+------------------+           PSV cm/sEDV cm/sStenosisPlaque DescriptionComments           +----------+--------+--------+--------+------------------+------------------+  CCA Prox  42      11                                                   +----------+--------+--------+--------+------------------+------------------+ CCA Distal36      12              focal and calcificintimal thickening +----------+--------+--------+--------+------------------+------------------+ ICA Prox  35      17                                                   +----------+--------+--------+--------+------------------+------------------+ ICA Distal38      16                                                   +----------+--------+--------+--------+------------------+------------------+ ECA       38      7                                                    +----------+--------+--------+--------+------------------+------------------+ +----------+--------+--------+----------------+-------------------+           PSV cm/sEDV cm/sDescribe        Arm Pressure (mmHG) +----------+--------+--------+----------------+-------------------+ [id number]              Multiphasic, WNL                    +----------+--------+--------+----------------+-------------------+ +---------+--------+--+--------+-+---------+ VertebralPSV cm/s18EDV cm/s6Antegrade +---------+--------+--+--------+-+---------+   Summary: Right Carotid: The extracranial vessels were  near-normal with only minimal wall                thickening or plaque. Left Carotid: The extracranial vessels were near-normal with only minimal wall               thickening or plaque. Vertebrals:  Bilateral vertebral arteries demonstrate antegrade flow. Subclavians: Normal flow hemodynamics were seen in bilateral subclavian              arteries. *See table(s) above for measurements and observations.  Electronically signed by Delia Heady MD on 11/26/2022 at 8:01:19 AM.    Final    CT ANGIO HEAD W OR WO CONTRAST  Result Date: 11/25/2022 CLINICAL DATA:  Follow-up examination for stroke. EXAM: CT ANGIOGRAPHY HEAD TECHNIQUE: Multidetector CT imaging of the head was performed using the standard protocol during bolus administration of intravenous contrast. Multiplanar CT image reconstructions and MIPs were obtained to evaluate the vascular anatomy. RADIATION DOSE REDUCTION: This exam was performed according to the departmental dose-optimization program which includes automated exposure control, adjustment of the mA and/or kV according to patient size and/or use of iterative reconstruction technique. CONTRAST:  75mL OMNIPAQUE IOHEXOL 350 MG/ML SOLN COMPARISON:  Comparison made with prior MRI from 11/24/2022. FINDINGS: CT HEAD Brain: Cerebral volume within normal limits. Mild chronic microvascular ischemic disease again noted. Previously identified scattered small volume ischemic infarcts are not well visualized by CT. No other new large vessel territory infarct. No intracranial  hemorrhage. No mass lesion, mass effect or midline shift. No hydrocephalus or extra-axial fluid collection. Vascular: No abnormal hyperdense vessel. Scattered vascular calcifications noted within the carotid siphons. Skull: Scalp soft tissues and calvarium demonstrate no acute finding. Sinuses: Globes and orbital soft tissues within normal limits. Paranasal sinuses and mastoid air cells are clear. Other: None. CTA HEAD Anterior circulation:  Visualized distal cervical segments of the internal carotid arteries are widely patent bilaterally. Mild atheromatous change within the carotid siphons without hemodynamically significant stenosis. A1 segments patent bilaterally. Normal anterior communicating complex. Both anterior cerebral arteries are widely patent without stenosis. No M1 stenosis or occlusion. Distal right MCA branches are well perfused. On the left, there is a focal filling defect at a distal left M2/M3 junction (series 10, image 62), consistent with thrombus. Perfusion is seen distally. Posterior circulation: Both V4 segments patent without stenosis. Right vertebral artery dominant. Both PICA patent. Basilar patent without stenosis. Left SCA patent. Right SCA not well seen, but appears grossly patent as well. Both PCAs supplied primarily via the basilar. PCAs are patent distal aspects without stenosis or occlusion. Venous sinuses: Grossly patent allowing for timing the contrast bolus. Anatomic variants: None significant.  No aneurysm. Review of the MIP images confirms the above findings. IMPRESSION: CT HEAD IMPRESSION: 1. Previously identified scattered small volume ischemic infarcts are not well visualized by CT. No other new large vessel territory infarct, intracranial hemorrhage, or other new acute abnormality. 2. Underlying mild chronic microvascular ischemic disease. CTA HEAD: 1. Focal filling defect at a distal left M2/M3 junction, consistent with subocclusive thrombus. Perfusion is seen distally. 2. Otherwise wide patency of the major arterial vasculature of the head. No other large vessel occlusion. No hemodynamically significant or correctable stenosis. 3. Mild atheromatous change within the carotid siphons without hemodynamically significant stenosis. Electronically Signed   By: Rise Mu M.D.   On: 11/25/2022 22:25   DG Swallowing Func-Speech Pathology  Result Date: 11/25/2022 Table formatting from the original result  was not included. Modified Barium Swallow Study Patient Details Name: Jozelyn Capehart MRN: [id number] Date of Birth: July 01, 1935 Today's Date: 11/25/2022 HPI/PMH: HPI: 87 y.o. female with medical history pancreatic ca, cardiac issues, adm with nonspecific weakness x few weeks, found to be in rapid atrial fibrillation. MRI brain: Scattered subcentimeter acute ischemic nonhemorrhagic infarcts involving the bilateral cerebral and cerebellar hemispheres, watershed in distribution.  Swallow and speech/language evaluation ordered. Clinical Impression: Clinical Impression: Patient presents with severe oral and minimal pharyngeal dysphagia.  Lingual coordination and strength is severely impaired.  These lingual deficits resulting in excessively delayed oral transit with severe lingual pumping often resulting in boluses spilling into pharynx uncontrolled.  Patient required upwards of 40 seconds to transit small boluses of pudding mixed with apple sauce and once bolus was posterior to essentially gradually spilled further and to the pharynx without patient reflexively swallowing. She finally swallowed pudding and poorly masticated cracker with thin liquids.  Various techniques including counting 1-3 swallow, cued swallowing,  dry spoon to the mouth to help trigger swallow etc. were not effective.  Pharyngeal swallow is largely intact once it triggers and patient has very large piriform sinuses that allows pooling of liquids prior to swallowing trigger.  She is at severe risk of malnutrition, dehydration with current level of dysphagia.  SLP advises patient start a full liquid diet (no pudding or grits) with very strict precautions including oral suctioning if she does not swallow and observing laryngeal elevation to assure swallow is triggered.  Patient  did not cough nor clear her throat during this exam reflexively as was clinically observed during bedside swallow evaluation.  Suspect she has at least occasional laryngeal  penetration of boluses or/and secretions to vocal folds.  Per family at baseline patient is feeding herself and managing swallowing well.  Based on their statement,  this appears to be a significant decline.   SLP will follow closely for p.o. tolerance, readiness for dietary advancement. Factors that may increase risk of adverse event in presence of aspiration Rubye Oaks & Clearance Coots 2021): Factors that may increase risk of adverse event in presence of aspiration Rubye Oaks & Clearance Coots 2021): Limited mobility; Weak cough; Reduced cognitive function; Dependence for feeding and/or oral hygiene Recommendations/Plan: Swallowing Evaluation Recommendations Swallowing Evaluation Recommendations Recommendations: PO diet PO Diet Recommendation: -- (Full liquid diet no pudding or grits) Liquid Administration via: Straw; Cup Medication Administration: Via alternative means (Suspension, crushed with liquid, or via IV) Supervision: Full supervision/cueing for swallowing strategies Swallowing strategies  : Minimize environmental distractions; Slow rate; Small bites/sips (Provide liquids to aid swallowing with thicker consistencies.  Oral suction if patient does not swallow) Postural changes: Position pt fully upright for meals; Stay upright 30-60 min after meals (Patient must be fully upright for all p.o.) Oral care recommendations: Oral care BID (2x/day) Recommended consults: Consider Palliative care Caregiver Recommendations: Have oral suction available Treatment Plan Treatment Plan Treatment recommendations: Therapy as outlined in treatment plan below Follow-up recommendations: Follow physicians's recommendations for discharge plan and follow up therapies Functional status assessment: Patient has had a recent decline in their functional status and demonstrates the ability to make significant improvements in function in a reasonable and predictable amount of time. Treatment frequency: Min 1x/week Treatment duration: 1 week Interventions:  Aspiration precaution training; Compensatory techniques; Patient/family education; Trials of upgraded texture/liquids; Diet toleration management by SLP Recommendations Recommendations for follow up therapy are one component of a multi-disciplinary discharge planning process, led by the attending physician.  Recommendations may be updated based on patient status, additional functional criteria and insurance authorization. Assessment: Orofacial Exam: Orofacial Exam Oral Cavity: Oral Hygiene: Pooled secretions Oral Cavity - Dentition: Dentures, top; Dentures, bottom Orofacial Anatomy: WFL Oral Motor/Sensory Function: Unable to test (able to seal lips on a straw, spoon) Anatomy: Anatomy: WFL Boluses Administered: Boluses Administered Boluses Administered: Thin liquids (Level 0); Mildly thick liquids (Level 2, nectar thick); Puree; Solid  Oral Impairment Domain: Oral Impairment Domain Lip Closure: Escape beyond mid-chin Tongue control during bolus hold: Posterior escape of greater than half of bolus; Posterior escape of less than half of bolus Bolus preparation/mastication: Minimal chewing/mashing with majority of bolus unchewed; Disorganized chewing/mashing with solid pieces of bolus unchewed Bolus transport/lingual motion: Repetitive/disorganized tongue motion Oral residue: Trace residue lining oral structures; Residue collection on oral structures Location of oral residue : Tongue; Floor of mouth Initiation of pharyngeal swallow : Pyriform sinuses  Pharyngeal Impairment Domain: Pharyngeal Impairment Domain Soft palate elevation: No bolus between soft palate (SP)/pharyngeal wall (PW) Laryngeal elevation: Complete superior movement of thyroid cartilage with complete approximation of arytenoids to epiglottic petiole Anterior hyoid excursion: Complete anterior movement Epiglottic movement: Complete inversion Laryngeal vestibule closure: Complete, no air/contrast in laryngeal vestibule Pharyngeal stripping wave : Present  - complete Pharyngeal contraction (A/P view only): N/A Pharyngoesophageal segment opening: Complete distension and complete duration, no obstruction of flow Tongue base retraction: No contrast between tongue base and posterior pharyngeal wall (PPW) Pharyngeal residue: Trace residue within or on pharyngeal structures Location of pharyngeal residue: Tongue base; Pyriform sinuses  Esophageal Impairment Domain: Esophageal Impairment Domain Esophageal clearance upright position: Esophageal retention Pill: Esophageal Impairment Domain Esophageal clearance upright position: Esophageal retention Penetration/Aspiration Scale Score: Penetration/Aspiration Scale Score 1.  Material does not enter airway: Mildly thick liquids (Level 2, nectar thick); Puree; Solid 2.  Material enters airway, remains ABOVE vocal cords then ejected out: Thin liquids (Level 0) Compensatory Strategies: Compensatory Strategies Compensatory strategies: Yes Straw: Effective Effective Straw: Thin liquid (Level 0) Liquid wash: Effective Effective Liquid Wash: Puree; Solid Other(comment): Ineffective Ineffective Other(comment): Thin liquid (Level 0); Mildly thick liquid (Level 2, nectar thick); Puree; Solid   General Information: Caregiver present: No  Diet Prior to this Study: NPO   No data recorded  Respiratory Status: WFL   Supplemental O2: None (Room air)   History of Recent Intubation: No  Behavior/Cognition: Alert; Doesn't follow directions; Other (Comment) Self-Feeding Abilities: Dependent for feeding Baseline vocal quality/speech: Hypophonia/low volume Volitional Cough: Unable to elicit Volitional Swallow: Unable to elicit Exam Limitations: No limitations Goal Planning: Prognosis for improved oropharyngeal function: Fair Barriers to Reach Goals: Severity of deficits No data recorded Patient/Family Stated Goal: for pt to be able to eat Consulted and agree with results and recommendations: Nurse; Pt unable/family or caregiver not available Pain: Pain  Assessment Pain Assessment: No/denies pain Breathing: 0 Negative Vocalization: 0 Facial Expression: 0 Body Language: 0 Consolability: 0 PAINAD Score: 0 End of Session: Start Time:SLP Start Time (ACUTE ONLY): 1420 Stop Time: SLP Stop Time (ACUTE ONLY): 1440 Time Calculation:SLP Time Calculation (min) (ACUTE ONLY): 20 min Charges: SLP Evaluations $ SLP Speech Visit: 1 Visit SLP Evaluations $BSS Swallow: 1 Procedure $MBS Swallow: 1 Procedure $Swallowing Treatment: 1 Procedure SLP visit diagnosis: SLP Visit Diagnosis: Dysphagia, oropharyngeal phase (R13.12) Past Medical History: Past Medical History: Diagnosis Date  Acquired diverticulum of distal esophagus with dysphagia 10/03/2018  AKI (acute kidney injury) (HCC) 08/31/2018  Anxiety   Bradycardia 02/16/2014  DDD (degenerative disc disease), lumbar   DJD (degenerative joint disease)   Esophageal stenosis 08/31/2018  Fibromyalgia   Hemorrhoid   Hypertension   Hypothyroidism   Osteopenia   Sigmoid diverticulosis   Thyroid nodule   PT DENIES Past Surgical History: Past Surgical History: Procedure Laterality Date  ABDOMINAL HYSTERECTOMY    COMPLETE  BALLOON DILATION N/A 09/01/2018  Procedure: BALLOON DILATION;  Surgeon: Kerin Salen, MD;  Location: [facility] ENDOSCOPY;  Service: Gastroenterology;  Laterality: N/A;  BIOPSY  09/01/2018  Procedure: BIOPSY;  Surgeon: Kerin Salen, MD;  Location: [facility] ENDOSCOPY;  Service: Gastroenterology;;  BIOPSY  01/26/2022  Procedure: BIOPSY;  Surgeon: Lemar Lofty., MD;  Location: [facility] ENDOSCOPY;  Service: Gastroenterology;;  BOTOX INJECTION  09/01/2018  Procedure: BOTOX INJECTION;  Surgeon: Kerin Salen, MD;  Location: [facility] ENDOSCOPY;  Service: Gastroenterology;;  BREAST SURGERY    breast biopsy  CHOLECYSTECTOMY    ESOPHAGEAL MANOMETRY N/A 10/07/2018  Procedure: ESOPHAGEAL MANOMETRY (EM);  Surgeon: Kerin Salen, MD;  Location: [facility] ENDOSCOPY;  Service: Gastroenterology;  Laterality: N/A;  ESOPHAGOGASTRODUODENOSCOPY N/A 10/25/2018  Procedure:  ESOPHAGOGASTRODUODENOSCOPY (EGD);  Surgeon: Karie Soda, MD;  Location: [facility] ORS;  Service: General;  Laterality: N/A;  ESOPHAGOGASTRODUODENOSCOPY N/A 01/26/2022  Procedure: ESOPHAGOGASTRODUODENOSCOPY (EGD);  Surgeon: Lemar Lofty., MD;  Location: [facility] ENDOSCOPY;  Service: Gastroenterology;  Laterality: N/A;  ESOPHAGOGASTRODUODENOSCOPY (EGD) WITH PROPOFOL N/A 09/01/2018  Procedure: ESOPHAGOGASTRODUODENOSCOPY (EGD) WITH PROPOFOL;  Surgeon: Kerin Salen, MD;  Location: [facility] ENDOSCOPY;  Service: Gastroenterology;  Laterality: N/A;  ESOPHAGOGASTRODUODENOSCOPY (EGD) WITH PROPOFOL N/A 12/30/2021  Procedure: ESOPHAGOGASTRODUODENOSCOPY (EGD) WITH PROPOFOL;  Surgeon: Willis Modena, MD;  Location: [facility] ENDOSCOPY;  Service: Gastroenterology;  Laterality: N/A;  EUS N/A 01/26/2022  Procedure: UPPER ENDOSCOPIC ULTRASOUND (EUS) RADIAL;  Surgeon: Lemar Lofty., MD;  Location: [facility] ENDOSCOPY;  Service: Gastroenterology;  Laterality: N/A;  FIDUCIAL MARKER PLACEMENT N/A 01/26/2022  Procedure: FIDUCIAL MARKER PLACEMENT;  Surgeon: Lemar Lofty., MD;  Location: [facility] ENDOSCOPY;  Service: Gastroenterology;  Laterality: N/A;  FINE NEEDLE ASPIRATION N/A 12/30/2021  Procedure: FINE NEEDLE ASPIRATION (FNA) LINEAR;  Surgeon: Willis Modena, MD;  Location: [facility] ENDOSCOPY;  Service: Gastroenterology;  Laterality: N/A;  FOREIGN BODY REMOVAL  01/26/2022  Procedure: FOREIGN BODY REMOVAL;  Surgeon: Meridee Score Netty Starring., MD;  Location: [facility] ENDOSCOPY;  Service: Gastroenterology;;  PACEMAKER IMPLANT N/A 03/06/2019  Procedure: PACEMAKER IMPLANT;  Surgeon: Marinus Maw, MD;  Location: [facility] INVASIVE CV LAB;  Service: Cardiovascular;  Laterality: N/A;  UPPER ESOPHAGEAL ENDOSCOPIC ULTRASOUND (EUS) Bilateral 12/30/2021  Procedure: UPPER ESOPHAGEAL ENDOSCOPIC ULTRASOUND (EUS);  Surgeon: Willis Modena, MD;  Location: [facility] ENDOSCOPY;  Service: Gastroenterology;  Laterality: Bilateral; [name], MS [facility] SLP Acute Rehab Services Office [phone]  Chales Abrahams 11/25/2022, 3:20 PM  MR BRAIN WO CONTRAST  Result Date: 11/24/2022 CLINICAL DATA:  Initial evaluation for neuro deficit, stroke. EXAM: MRI HEAD WITHOUT CONTRAST TECHNIQUE: Multiplanar, multiecho pulse sequences of the brain and surrounding structures were obtained without intravenous contrast. COMPARISON:  CT from 11/23/2022. FINDINGS: Brain: Examination degraded by motion artifact. Cerebral volume within normal limits. Mild chronic microvascular ischemic disease for age. Patchy scattered subcentimeter foci of restricted diffusion are seen involving the bilateral cerebral and cerebellar hemispheres. Patchy cortical and subcortical involvement. Overall distribution is watershed in nature. No associated hemorrhage or mass effect. Gray-white matter differentiation maintained. No areas of chronic cortical infarction. No acute or chronic intracranial blood products. No mass lesion, midline shift or mass effect. No hydrocephalus or extra-axial fluid collection. Pituitary gland suprasellar region within normal limits. Vascular: Major intracranial vascular flow voids are maintained. Skull and upper cervical spine: Craniocervical junction within normal limits. Bone marrow signal intensity within normal limits. No scalp soft tissue abnormality. Sinuses/Orbits: Globes orbital soft tissues within normal limits. Paranasal sinuses are clear. No mastoid effusion. Other: None. IMPRESSION: 1. Scattered subcentimeter acute ischemic nonhemorrhagic infarcts involving the bilateral cerebral and cerebellar hemispheres, watershed in distribution. Overall pattern favors a hypoperfusion injury. 2. Underlying mild chronic microvascular ischemic disease for age. Electronically Signed   By: Rise Mu M.D.   On: 11/24/2022 18:18   ECHOCARDIOGRAM COMPLETE  Result Date: 11/24/2022    ECHOCARDIOGRAM REPORT   Patient Name:   [place] Date of Exam: 11/24/2022 Medical Rec #:  [id number]     Height:       66.0  in Accession #:    [id number]    Weight:       121.0 lb Date of Birth:  01/26/1936     BSA:          1.615 m Patient Age:    86 years      BP:           165/86 mmHg Patient Gender: F             HR:           66 bpm. Exam Location:  Inpatient Procedure: 2D Echo, 3D Echo, Cardiac Doppler and Color Doppler Indications:    R07.9* Chest pain, unspecified. Acute AMS.  History:        Patient has prior history of Echocardiogram examinations, most  recent 12/27/2018. Abnormal ECG and Pacemaker, Mitral Valve                 Prolapse, Mitral Valve Disease and Aortic Valve Disease,                 Arrythmias:Atrial Fibrillation and Bradycardia,                 Signs/Symptoms:Syncope; Risk Factors:Hypertension. Cancer.  Sonographer:    Sheralyn Boatman RDCS Referring Phys: [id number] Jonita Albee IMPRESSIONS  1. Left ventricular ejection fraction, by estimation, is 45 to 50%. The left ventricle has mildly decreased function. The left ventricle has no regional wall motion abnormalities. There is mild concentric left ventricular hypertrophy. Left ventricular diastolic parameters are consistent with Grade II diastolic dysfunction (pseudonormalization).  2. Right ventricular systolic function is normal. The right ventricular size is normal. There is moderately elevated pulmonary artery systolic pressure. The estimated right ventricular systolic pressure is 52.2 mmHg.  3. Left atrial size was severely dilated.  4. Right atrial size was moderately dilated.  5. The mitral valve is grossly normal. Mild mitral valve regurgitation. No evidence of mitral stenosis. There is mild holosystolic prolapse of the middle scallop of the posterior leaflet of the mitral valve.  6. Tricuspid valve regurgitation is mild to moderate.  7. Focal calcification of the non coronary cusp . The aortic valve is tricuspid. Aortic valve regurgitation is not visualized. Aortic valve sclerosis/calcification is present, without any evidence of aortic  stenosis.  8. The inferior vena cava is dilated in size with <50% respiratory variability, suggesting right atrial pressure of 15 mmHg. Comparison(s): Changes from prior study are noted. The left ventricular function is worsened. LVEF now 45-50%. Mild MR. Mild to moderate TR. RVSP 52 mmHG. FINDINGS  Left Ventricle: Left ventricular ejection fraction, by estimation, is 45 to 50%. The left ventricle has mildly decreased function. The left ventricle has no regional wall motion abnormalities. The left ventricular internal cavity size was normal in size. There is mild concentric left ventricular hypertrophy. Abnormal (paradoxical) septal motion, consistent with RV pacemaker. Left ventricular diastolic parameters are consistent with Grade II diastolic dysfunction (pseudonormalization). Right Ventricle: The right ventricular size is normal. No increase in right ventricular wall thickness. Right ventricular systolic function is normal. There is moderately elevated pulmonary artery systolic pressure. The tricuspid regurgitant velocity is 3.05 m/s, and with an assumed right atrial pressure of 15 mmHg, the estimated right ventricular systolic pressure is 52.2 mmHg. Left Atrium: Left atrial size was severely dilated. Right Atrium: Right atrial size was moderately dilated. Pericardium: There is no evidence of pericardial effusion. Mitral Valve: The mitral valve is grossly normal. There is mild holosystolic prolapse of the middle scallop of the posterior leaflet of the mitral valve. There is moderate thickening of the mitral valve leaflet(s). Mild mitral valve regurgitation. No evidence of mitral valve stenosis. Tricuspid Valve: The tricuspid valve is grossly normal. Tricuspid valve regurgitation is mild to moderate. No evidence of tricuspid stenosis. Aortic Valve: Focal calcification of the non coronary cusp. The aortic valve is tricuspid. Aortic valve regurgitation is not visualized. Aortic valve sclerosis/calcification is  present, without any evidence of aortic stenosis. Pulmonic Valve: The pulmonic valve was grossly normal. Pulmonic valve regurgitation is trivial. No evidence of pulmonic stenosis. Aorta: The aortic root and ascending aorta are structurally normal, with no evidence of dilitation. Venous: The inferior vena cava is dilated in size with less than 50% respiratory variability, suggesting right atrial pressure of 15 mmHg.  IAS/Shunts: There is right bowing of the interatrial septum, suggestive of elevated left atrial pressure. The atrial septum is grossly normal. Additional Comments: A device lead is visualized in the right atrium and right ventricle.  LEFT VENTRICLE PLAX 2D LVIDd:         4.00 cm   Diastology LVIDs:         3.30 cm   LV e' medial:    5.11 cm/s LV PW:         1.30 cm   LV E/e' medial:  12.3 LV IVS:        1.20 cm   LV e' lateral:   6.96 cm/s LVOT diam:     2.20 cm   LV E/e' lateral: 9.0 LV SV:         85 LV SV Index:   52 LVOT Area:     3.80 cm                           3D Volume EF:                          3D EF:        50 % RIGHT VENTRICLE             IVC RV S prime:     14.70 cm/s  IVC diam: 1.95 cm TAPSE (M-mode): 2.4 cm LEFT ATRIUM              Index        RIGHT ATRIUM           Index LA diam:        5.90 cm  3.65 cm/m   RA Area:     18.00 cm LA Vol (A2C):   128.0 ml 79.25 ml/m  RA Volume:   41.40 ml  25.63 ml/m LA Vol (A4C):   86.8 ml  53.74 ml/m LA Biplane Vol: 110.0 ml 68.10 ml/m  AORTIC VALVE             PULMONIC VALVE LVOT Vmax:   118.00 cm/s PR End Diast Vel: 1.91 msec LVOT Vmean:  73.100 cm/s LVOT VTI:    0.223 m  AORTA Ao Root diam: 2.90 cm Ao Asc diam:  3.20 cm MITRAL VALVE                  TRICUSPID VALVE MV Area (PHT): 3.39 cm       TR Peak grad:   37.2 mmHg MV Decel Time: 224 msec       TR Vmax:        305.00 cm/s MR Peak grad:    95.6 mmHg MR Mean grad:    60.0 mmHg    SHUNTS MR Vmax:         489.00 cm/s  Systemic VTI:  0.22 m MR Vmean:        360.0 cm/s   Systemic Diam: 2.20 cm  MR PISA:         0.57 cm MR PISA Eff ROA: 4 mm MR PISA Radius:  0.30 cm MV E velocity: 62.60 cm/s MV A velocity: 40.25 cm/s MV E/A ratio:  1.56 Lennie Odor MD Electronically signed by Lennie Odor MD Signature Date/Time: 11/24/2022/12:16:24 PM    Final    CT Head Wo Contrast  Result Date: 11/23/2022 CLINICAL DATA:  Provided history: Mental status change, unknown cause. EXAM: CT HEAD WITHOUT CONTRAST  TECHNIQUE: Contiguous axial images were obtained from the base of the skull through the vertex without intravenous contrast. RADIATION DOSE REDUCTION: This exam was performed according to the departmental dose-optimization program which includes automated exposure control, adjustment of the mA and/or kV according to patient size and/or use of iterative reconstruction technique. COMPARISON:  No pertinent prior exams available for comparison. FINDINGS: Brain: Mild cerebral atrophy. There is no acute intracranial hemorrhage. No demarcated cortical infarct. No extra-axial fluid collection. No evidence of an intracranial mass. No midline shift. Vascular: No hyperdense vessel.  Atherosclerotic calcifications. Skull: No fracture or aggressive osseous lesion. Sinuses/Orbits: No mass or acute finding within the imaged orbits. Tiny mucous retention cyst within the right maxillary sinus. Minimal mucosal thickening within the left maxillary sinus and bilateral ethmoid sinuses. IMPRESSION: 1. No evidence of an acute intracranial abnormality. 2. Mild cerebral atrophy. Electronically Signed   By: Jackey Loge D.O.   On: 11/23/2022 09:54   DG Chest 2 View  Result Date: 11/23/2022 CLINICAL DATA:  Weakness. EXAM: CHEST - 2 VIEW COMPARISON:  03/07/2019. FINDINGS: Left chest dual-chamber pacemaker with leads projecting over the right atrium and ventricle. Low lung volumes accentuate the pulmonary vasculature and cardiomediastinal silhouette. No consolidation or pulmonary edema. Stable cardiomegaly and mediastinal contours with  tortuous aorta. No pleural effusion or pneumothorax. IMPRESSION: No evidence of acute cardiopulmonary disease. Electronically Signed   By: Orvan Falconer M.D.   On: 11/23/2022 09:23    Microbiology: Results for orders placed or performed during the hospital encounter of 11/23/22  SARS Coronavirus 2 by RT PCR (hospital order, performed in [facility] hospital lab) *cepheid single result test* Anterior Nasal Swab     Status: None   Collection Time: 11/23/22  9:04 AM   Specimen: Anterior Nasal Swab  Result Value Ref Range Status   SARS Coronavirus 2 by RT PCR NEGATIVE NEGATIVE Final    Comment: (NOTE) SARS-CoV-2 target nucleic acids are NOT DETECTED.  The SARS-CoV-2 RNA is generally detectable in upper and lower respiratory specimens during the acute phase of infection. The lowest concentration of SARS-CoV-2 viral copies this assay can detect is 250 copies / mL. A negative result does not preclude SARS-CoV-2 infection and should not be used as the sole basis for treatment or other patient management decisions.  A negative result may occur with improper specimen collection / handling, submission of specimen other than nasopharyngeal swab, presence of viral mutation(s) within the areas targeted by this assay, and inadequate number of viral copies (<250 copies / mL). A negative result must be combined with clinical observations, patient history, and epidemiological information.  Fact Sheet for Patients:   RoadLapTop.co.za  Fact Sheet for Healthcare Providers: http://kim-miller.com/  This test is not yet approved or  cleared by the [place] FDA and has been authorized for detection and/or diagnosis of SARS-CoV-2 by FDA under an Emergency Use Authorization (EUA).  This EUA will remain in effect (meaning this test can be used) for the duration of the COVID-19 declaration under Section 564(b)(1) of the Act, 21 U.S.C. section 360bbb-3(b)(1),  unless the authorization is terminated or revoked sooner.  Performed at [facility], [address], [place], [place] [address]     Labs: CBC: Recent Labs  Lab 11/27/22 0415  WBC 4.3  HGB 9.4*  HCT 27.8*  MCV 71.1*  PLT 171   Basic Metabolic Panel: Recent Labs  Lab 11/27/22 0415 11/29/22 0433  NA 133* 133*  K 3.4* 3.8  CL 102 101  CO2 23 25  GLUCOSE 95 89  BUN 9 9  CREATININE 0.62 0.74  CALCIUM 8.9 9.3  MG  --  1.9   Liver Function Tests: No results for input(s): "AST", "ALT", "ALKPHOS", "BILITOT", "PROT", "ALBUMIN" in the last 168 hours. CBG: No results for input(s): "GLUCAP" in the last 168 hours.  Discharge time spent: greater than 30 minutes.  Signed: Meredeth Ide, MD [facility] 12/03/2022

## 2022-12-03 NOTE — Care Management Important Message (Signed)
Important Message  Patient Details IM Letter given. Name: Envy Lust MRN: [id number] Date of Birth: 1935-09-19   Medicare Important Message Given:  Yes     Caren Macadam 12/03/2022, 10:55 AM

## 2022-12-03 NOTE — Progress Notes (Signed)
  Daily Progress Note   Patient Name: Shannon Ortiz       Date: 12/03/2022 DOB: [birth year]  Age: 87 y.o. MRN#: [id number] Attending Physician: Meredeth Ide, MD Primary Care Physician: Daisy Floro, MD Admit Date: 11/23/2022 Length of Stay: 9 days  As per EMR review, patient to be discharged to rehab today. Patient last seen by palliative medicine provider Dr. Linna Darner on 6/17 where goals for medical care remained full scope of care. Recommended home palliative medicine referral along in addition to rehab. Please reach out if further acute PMT needs arise. Thank you.   Alvester Morin, DO Palliative Care Provider PMT # [phone]

## 2022-12-03 NOTE — TOC Transition Note (Signed)
Transition of Care [facility]) - CM/SW Discharge Note   Patient Details  Name: [name] MRN: [id number] Date of Birth: 12-17-1935  Transition of Care [facility]) CM/SW Contact:  Larrie Kass, LCSW Phone Number: 12/03/2022, 9:48 AM   Clinical Narrative:    Pt to d/c to [facility]. CSW spoke with the daughter to inform her pt will be d/c today to [facility]. She stated she would like to talk with the facility to ask them some questions, she reported she would be calling them.  10:00am CSW spoke with [facility] admission coordinator, she reports calling pt's daughter and leaving a VM, but she has not received a callback.    12:38 pm CSW received a message from [facility], pt's family has not spoken with the facility about intake paperwork.   CSW spoke with pt's daughter [name], to inform her about how the facility will need someone to sign her mother in. Ms love reports her sister [name] is the one who makes the decision and she is at work. She reports Ms [name] gets off at 3 pm. CSW called Ms [name] and left VM informing her of pt's Room assignment at the facility and EMS has been called.   1:25 pm CSW spoke with pt's son [name] ([phone]) to inform him about pt's d/c to [facility]. He reports pt had concerns about how she sleeps. CSW informed Mr Kitch that if he has any questions about pt's medical disposition he would need to speak with pt's MD or RN. Pt's son also inquired about Medicaid, and CSW informed him he would have to apply for this at [facility].   Pt's RM assignment is [phone] call report [phone] facility will assist with arranging OP palliative care. PTAR called  No further TOC needs TOC sign-off.      Barriers to Discharge: Continued Medical Work up   Patient Goals and CMS Choice CMS Medicare.gov Compare Post Acute Care list provided to:: Patient Represenative (must comment) Choice offered to / list presented to : Adult  Children  Discharge Placement                         Discharge Plan and Services Additional resources added to the After Visit Summary for                                       Social Determinants of Health (SDOH) Interventions SDOH Screenings   Food Insecurity: No Food Insecurity (11/23/2022)  Housing: Patient Declined (11/23/2022)  Transportation Needs: No Transportation Needs (11/23/2022)  Utilities: Not At Risk (11/23/2022)  Tobacco Use: Low Risk  (11/30/2022)     Readmission Risk Interventions     No data to display

## 2022-12-04 DIAGNOSIS — Z741 Need for assistance with personal care: Secondary | ICD-10-CM | POA: Diagnosis not present

## 2022-12-04 DIAGNOSIS — I10 Essential (primary) hypertension: Secondary | ICD-10-CM | POA: Diagnosis not present

## 2022-12-04 DIAGNOSIS — I480 Paroxysmal atrial fibrillation: Secondary | ICD-10-CM | POA: Diagnosis not present

## 2022-12-04 DIAGNOSIS — M6281 Muscle weakness (generalized): Secondary | ICD-10-CM | POA: Diagnosis not present

## 2022-12-04 DIAGNOSIS — I4891 Unspecified atrial fibrillation: Secondary | ICD-10-CM | POA: Diagnosis not present

## 2022-12-04 DIAGNOSIS — I69391 Dysphagia following cerebral infarction: Secondary | ICD-10-CM | POA: Diagnosis not present

## 2022-12-04 DIAGNOSIS — R278 Other lack of coordination: Secondary | ICD-10-CM | POA: Diagnosis not present

## 2022-12-04 DIAGNOSIS — C253 Malignant neoplasm of pancreatic duct: Secondary | ICD-10-CM | POA: Diagnosis not present

## 2022-12-04 DIAGNOSIS — I639 Cerebral infarction, unspecified: Secondary | ICD-10-CM | POA: Diagnosis not present

## 2022-12-07 DIAGNOSIS — I10 Essential (primary) hypertension: Secondary | ICD-10-CM | POA: Diagnosis not present

## 2022-12-07 DIAGNOSIS — M6281 Muscle weakness (generalized): Secondary | ICD-10-CM | POA: Diagnosis not present

## 2022-12-07 DIAGNOSIS — B370 Candidal stomatitis: Secondary | ICD-10-CM | POA: Diagnosis not present

## 2022-12-07 DIAGNOSIS — C253 Malignant neoplasm of pancreatic duct: Secondary | ICD-10-CM | POA: Diagnosis not present

## 2022-12-07 DIAGNOSIS — I639 Cerebral infarction, unspecified: Secondary | ICD-10-CM | POA: Diagnosis not present

## 2022-12-07 DIAGNOSIS — I480 Paroxysmal atrial fibrillation: Secondary | ICD-10-CM | POA: Diagnosis not present

## 2022-12-07 DIAGNOSIS — R296 Repeated falls: Secondary | ICD-10-CM | POA: Diagnosis not present

## 2022-12-08 DIAGNOSIS — I639 Cerebral infarction, unspecified: Secondary | ICD-10-CM | POA: Diagnosis not present

## 2022-12-08 DIAGNOSIS — C253 Malignant neoplasm of pancreatic duct: Secondary | ICD-10-CM | POA: Diagnosis not present

## 2022-12-08 DIAGNOSIS — M6281 Muscle weakness (generalized): Secondary | ICD-10-CM | POA: Diagnosis not present

## 2022-12-08 DIAGNOSIS — I4891 Unspecified atrial fibrillation: Secondary | ICD-10-CM | POA: Diagnosis not present

## 2022-12-08 DIAGNOSIS — I10 Essential (primary) hypertension: Secondary | ICD-10-CM | POA: Diagnosis not present

## 2022-12-09 DIAGNOSIS — Z741 Need for assistance with personal care: Secondary | ICD-10-CM | POA: Diagnosis not present

## 2022-12-09 DIAGNOSIS — M6281 Muscle weakness (generalized): Secondary | ICD-10-CM | POA: Diagnosis not present

## 2022-12-09 DIAGNOSIS — R278 Other lack of coordination: Secondary | ICD-10-CM | POA: Diagnosis not present

## 2022-12-09 DIAGNOSIS — I639 Cerebral infarction, unspecified: Secondary | ICD-10-CM | POA: Diagnosis not present

## 2022-12-09 DIAGNOSIS — I69391 Dysphagia following cerebral infarction: Secondary | ICD-10-CM | POA: Diagnosis not present

## 2022-12-09 DIAGNOSIS — I4891 Unspecified atrial fibrillation: Secondary | ICD-10-CM | POA: Diagnosis not present

## 2022-12-10 DIAGNOSIS — E875 Hyperkalemia: Secondary | ICD-10-CM | POA: Diagnosis not present

## 2022-12-10 DIAGNOSIS — M6281 Muscle weakness (generalized): Secondary | ICD-10-CM | POA: Diagnosis not present

## 2022-12-10 DIAGNOSIS — C253 Malignant neoplasm of pancreatic duct: Secondary | ICD-10-CM | POA: Diagnosis not present

## 2022-12-10 DIAGNOSIS — I639 Cerebral infarction, unspecified: Secondary | ICD-10-CM | POA: Diagnosis not present

## 2022-12-10 DIAGNOSIS — I10 Essential (primary) hypertension: Secondary | ICD-10-CM | POA: Diagnosis not present

## 2022-12-10 DIAGNOSIS — I480 Paroxysmal atrial fibrillation: Secondary | ICD-10-CM | POA: Diagnosis not present

## 2022-12-14 DIAGNOSIS — M6281 Muscle weakness (generalized): Secondary | ICD-10-CM | POA: Diagnosis not present

## 2022-12-14 DIAGNOSIS — I4891 Unspecified atrial fibrillation: Secondary | ICD-10-CM | POA: Diagnosis not present

## 2022-12-14 DIAGNOSIS — I639 Cerebral infarction, unspecified: Secondary | ICD-10-CM | POA: Diagnosis not present

## 2022-12-14 DIAGNOSIS — I10 Essential (primary) hypertension: Secondary | ICD-10-CM | POA: Diagnosis not present

## 2022-12-14 DIAGNOSIS — C253 Malignant neoplasm of pancreatic duct: Secondary | ICD-10-CM | POA: Diagnosis not present

## 2022-12-15 DIAGNOSIS — I480 Paroxysmal atrial fibrillation: Secondary | ICD-10-CM | POA: Diagnosis not present

## 2022-12-15 DIAGNOSIS — Z741 Need for assistance with personal care: Secondary | ICD-10-CM | POA: Diagnosis not present

## 2022-12-15 DIAGNOSIS — I10 Essential (primary) hypertension: Secondary | ICD-10-CM | POA: Diagnosis not present

## 2022-12-15 DIAGNOSIS — I69391 Dysphagia following cerebral infarction: Secondary | ICD-10-CM | POA: Diagnosis not present

## 2022-12-15 DIAGNOSIS — C253 Malignant neoplasm of pancreatic duct: Secondary | ICD-10-CM | POA: Diagnosis not present

## 2022-12-15 DIAGNOSIS — R278 Other lack of coordination: Secondary | ICD-10-CM | POA: Diagnosis not present

## 2022-12-15 DIAGNOSIS — I4891 Unspecified atrial fibrillation: Secondary | ICD-10-CM | POA: Diagnosis not present

## 2022-12-15 DIAGNOSIS — I639 Cerebral infarction, unspecified: Secondary | ICD-10-CM | POA: Diagnosis not present

## 2022-12-15 DIAGNOSIS — M6281 Muscle weakness (generalized): Secondary | ICD-10-CM | POA: Diagnosis not present

## 2022-12-15 DIAGNOSIS — E875 Hyperkalemia: Secondary | ICD-10-CM | POA: Diagnosis not present

## 2022-12-16 DIAGNOSIS — E875 Hyperkalemia: Secondary | ICD-10-CM | POA: Diagnosis not present

## 2022-12-16 DIAGNOSIS — I639 Cerebral infarction, unspecified: Secondary | ICD-10-CM | POA: Diagnosis not present

## 2022-12-16 DIAGNOSIS — M6281 Muscle weakness (generalized): Secondary | ICD-10-CM | POA: Diagnosis not present

## 2022-12-16 DIAGNOSIS — C253 Malignant neoplasm of pancreatic duct: Secondary | ICD-10-CM | POA: Diagnosis not present

## 2022-12-16 DIAGNOSIS — I480 Paroxysmal atrial fibrillation: Secondary | ICD-10-CM | POA: Diagnosis not present

## 2022-12-16 DIAGNOSIS — I10 Essential (primary) hypertension: Secondary | ICD-10-CM | POA: Diagnosis not present

## 2022-12-18 DIAGNOSIS — B370 Candidal stomatitis: Secondary | ICD-10-CM | POA: Diagnosis not present

## 2022-12-23 DIAGNOSIS — I69391 Dysphagia following cerebral infarction: Secondary | ICD-10-CM | POA: Diagnosis not present

## 2022-12-23 DIAGNOSIS — R278 Other lack of coordination: Secondary | ICD-10-CM | POA: Diagnosis not present

## 2022-12-23 DIAGNOSIS — M6281 Muscle weakness (generalized): Secondary | ICD-10-CM | POA: Diagnosis not present

## 2022-12-23 DIAGNOSIS — Z741 Need for assistance with personal care: Secondary | ICD-10-CM | POA: Diagnosis not present

## 2022-12-23 DIAGNOSIS — I639 Cerebral infarction, unspecified: Secondary | ICD-10-CM | POA: Diagnosis not present

## 2022-12-23 DIAGNOSIS — I4891 Unspecified atrial fibrillation: Secondary | ICD-10-CM | POA: Diagnosis not present

## 2022-12-24 DIAGNOSIS — I639 Cerebral infarction, unspecified: Secondary | ICD-10-CM | POA: Diagnosis not present

## 2022-12-24 DIAGNOSIS — Z741 Need for assistance with personal care: Secondary | ICD-10-CM | POA: Diagnosis not present

## 2022-12-24 DIAGNOSIS — I10 Essential (primary) hypertension: Secondary | ICD-10-CM | POA: Diagnosis not present

## 2022-12-24 DIAGNOSIS — R278 Other lack of coordination: Secondary | ICD-10-CM | POA: Diagnosis not present

## 2022-12-24 DIAGNOSIS — M6281 Muscle weakness (generalized): Secondary | ICD-10-CM | POA: Diagnosis not present

## 2022-12-24 DIAGNOSIS — I4891 Unspecified atrial fibrillation: Secondary | ICD-10-CM | POA: Diagnosis not present

## 2022-12-24 DIAGNOSIS — C253 Malignant neoplasm of pancreatic duct: Secondary | ICD-10-CM | POA: Diagnosis not present

## 2022-12-24 DIAGNOSIS — I480 Paroxysmal atrial fibrillation: Secondary | ICD-10-CM | POA: Diagnosis not present

## 2022-12-24 DIAGNOSIS — E875 Hyperkalemia: Secondary | ICD-10-CM | POA: Diagnosis not present

## 2022-12-24 DIAGNOSIS — I69391 Dysphagia following cerebral infarction: Secondary | ICD-10-CM | POA: Diagnosis not present

## 2022-12-29 ENCOUNTER — Other Ambulatory Visit: Payer: Self-pay | Admitting: *Deleted

## 2022-12-29 NOTE — Patient Outreach (Signed)
Per [facility] Shannon Ortiz discharged from [facility]  skilled nursing facility on 12/24/22. Screening for care coordination services as benefit of health plan and  Primary Care Provider.  Confirmed with Terri, Child psychotherapist, Shannon Ortiz transitioned home with [facility] home health.   Telephone call made to Shannon Ortiz [phone]. No answer. HIPAA compliant voicemail left to request return call.   PCP is with [facility].   Raiford Noble, MSN, RN,BSN [facility] Post Acute Care Coordinator [phone] (Direct dial)

## 2023-01-05 ENCOUNTER — Encounter: Payer: Self-pay | Admitting: Internal Medicine

## 2023-01-05 ENCOUNTER — Ambulatory Visit: Payer: Medicare Other | Attending: Internal Medicine | Admitting: Internal Medicine

## 2023-01-06 ENCOUNTER — Emergency Department (HOSPITAL_COMMUNITY)
Admission: EM | Admit: 2023-01-06 | Discharge: 2023-01-07 | Disposition: A | Discharge status: Home / Self Care | Payer: Medicare Other | Attending: Emergency Medicine | Admitting: Emergency Medicine

## 2023-01-06 ENCOUNTER — Emergency Department (HOSPITAL_COMMUNITY): Payer: Medicare Other

## 2023-01-06 DIAGNOSIS — R000 Tachycardia, unspecified: Secondary | ICD-10-CM | POA: Insufficient documentation

## 2023-01-06 DIAGNOSIS — Z7901 Long term (current) use of anticoagulants: Secondary | ICD-10-CM | POA: Diagnosis not present

## 2023-01-06 DIAGNOSIS — R569 Unspecified convulsions: Secondary | ICD-10-CM | POA: Diagnosis not present

## 2023-01-06 DIAGNOSIS — R404 Transient alteration of awareness: Secondary | ICD-10-CM | POA: Diagnosis not present

## 2023-01-06 DIAGNOSIS — R7989 Other specified abnormal findings of blood chemistry: Secondary | ICD-10-CM | POA: Diagnosis not present

## 2023-01-06 DIAGNOSIS — R55 Syncope and collapse: Secondary | ICD-10-CM | POA: Diagnosis not present

## 2023-01-06 DIAGNOSIS — Z743 Need for continuous supervision: Secondary | ICD-10-CM | POA: Diagnosis not present

## 2023-01-06 DIAGNOSIS — I10 Essential (primary) hypertension: Secondary | ICD-10-CM | POA: Diagnosis not present

## 2023-01-06 DIAGNOSIS — I6782 Cerebral ischemia: Secondary | ICD-10-CM | POA: Diagnosis not present

## 2023-01-06 DIAGNOSIS — I480 Paroxysmal atrial fibrillation: Secondary | ICD-10-CM | POA: Diagnosis not present

## 2023-01-06 LAB — CBC WITH DIFFERENTIAL/PLATELET
Abs Immature Granulocytes: 0.01 10*3/uL (ref 0.00–0.07)
Basophils Absolute: 0 10*3/uL (ref 0.0–0.1)
Basophils Relative: 0 %
Eosinophils Absolute: 0 10*3/uL (ref 0.0–0.5)
Eosinophils Relative: 0 %
HCT: 38.1 % (ref 36.0–46.0)
Hemoglobin: 12.2 g/dL (ref 12.0–15.0)
Immature Granulocytes: 0 %
Lymphocytes Relative: 11 %
Lymphs Abs: 0.5 10*3/uL — ABNORMAL LOW (ref 0.7–4.0)
MCH: 23.2 pg — ABNORMAL LOW (ref 26.0–34.0)
MCHC: 32 g/dL (ref 30.0–36.0)
MCV: 72.6 fL — ABNORMAL LOW (ref 80.0–100.0)
Monocytes Absolute: 0.2 10*3/uL (ref 0.1–1.0)
Monocytes Relative: 4 %
Neutro Abs: 3.6 10*3/uL (ref 1.7–7.7)
Neutrophils Relative %: 85 %
Platelets: 57 10*3/uL — ABNORMAL LOW (ref 150–400)
RBC: 5.25 MIL/uL — ABNORMAL HIGH (ref 3.87–5.11)
RDW: 20 % — ABNORMAL HIGH (ref 11.5–15.5)
WBC: 4.3 10*3/uL (ref 4.0–10.5)
nRBC: 0 % (ref 0.0–0.2)

## 2023-01-06 LAB — COMPREHENSIVE METABOLIC PANEL
ALT: 22 U/L (ref 0–44)
AST: 67 U/L — ABNORMAL HIGH (ref 15–41)
Albumin: 3.7 g/dL (ref 3.5–5.0)
Alkaline Phosphatase: 376 U/L — ABNORMAL HIGH (ref 38–126)
Anion gap: 15 (ref 5–15)
BUN: 29 mg/dL — ABNORMAL HIGH (ref 8–23)
CO2: 20 mmol/L — ABNORMAL LOW (ref 22–32)
Calcium: 10.1 mg/dL (ref 8.9–10.3)
Chloride: 103 mmol/L (ref 98–111)
Creatinine, Ser: 1.03 mg/dL — ABNORMAL HIGH (ref 0.44–1.00)
GFR, Estimated: 54 mL/min — ABNORMAL LOW (ref >60)
Glucose, Bld: 148 mg/dL — ABNORMAL HIGH (ref 70–99)
Potassium: 3.6 mmol/L (ref 3.5–5.1)
Sodium: 138 mmol/L (ref 135–145)
Total Bilirubin: 1.5 mg/dL — ABNORMAL HIGH (ref 0.3–1.2)
Total Protein: 7.5 g/dL (ref 6.5–8.1)

## 2023-01-06 LAB — IMMATURE PLATELET FRACTION: Immature Platelet Fraction: 4.5 % (ref 1.2–8.6)

## 2023-01-06 LAB — TROPONIN I (HIGH SENSITIVITY)
Troponin I (High Sensitivity): 113 ng/L (ref <18)
Troponin I (High Sensitivity): 117 ng/L (ref <18)

## 2023-01-06 LAB — CBG MONITORING, ED: Glucose-Capillary: 117 mg/dL — ABNORMAL HIGH (ref 70–99)

## 2023-01-06 MED ORDER — LACTATED RINGERS IV BOLUS
500.0000 mL | Freq: Once | INTRAVENOUS | Status: AC
  Administered 2023-01-06: 500 mL via INTRAVENOUS

## 2023-01-06 NOTE — ED Notes (Signed)
NSR room- yesterday and today 2 hrs afib RVR. Recent episode was this morning at 0947 for 1 hour 20 mins around 158 beats/ min.

## 2023-01-06 NOTE — ED Notes (Signed)
ED Provider at bedside. 

## 2023-01-06 NOTE — ED Provider Notes (Signed)
Shannon Ortiz Provider Note   CSN: [id number] Arrival date & time: 01/06/23  1711     History  Chief Complaint  Patient presents with   Loss of Consciousness    Shannon Ortiz is a 87 y.o. female.  HPI 87 year old female presents with syncope.  History is from Shannon Ortiz, her daughter, who provides the history over the phone.  She got out of the shower and the grandsons were helping her go back to the bed (she is unable to ambulate without assistance) and she passed out.  The grandson tells me that she went limp.  There was no increased tone or seizure-like activity.  She was limp for about 3 minutes.  Slowly started coming around over a few minutes.  Previous stroke over the last few months has affected her memory and she now cannot communicate normally.  She has not had a recent illness such as a fever or cough.  Right now, grandsons indicate that she is at her baseline.  They do note that is hard for her to get in a lot of fluids.   Home Medications Prior to Admission medications   Medication Sig Start Date End Date Taking? Authorizing Provider  ALPRAZolam Prudy Feeler) 0.5 MG tablet Take 1 tablet (0.5 mg total) by mouth 3 (three) times daily as needed for anxiety. 12/03/22   Meredeth Ide, MD  amLODipine (NORVASC) 10 MG tablet Take 1 tablet (10 mg total) by mouth daily. Please call our office to schedule an appt for future refills. Thank you 2nd attempt. 11/18/22   Marinus Maw, MD  apixaban (ELIQUIS) 2.5 MG TABS tablet Take 1 tablet (2.5 mg total) by mouth 2 (two) times daily. 09/09/22   Marinus Maw, MD  atorvastatin (LIPITOR) 20 MG tablet Take 1 tablet (20 mg total) by mouth daily. 12/04/22   Meredeth Ide, MD  docusate sodium (COLACE) 100 MG capsule Take 100 mg by mouth 2 (two) times daily. Patient not taking: Reported on 11/23/2022    [provider]  feeding supplement (ENSURE SURGERY) LIQD Take 237 mLs by mouth 2 (two) times daily  between meals. Patient taking differently: Take 237 mLs by mouth 3 (three) times a week. 10/17/18   Almon Hercules, MD  losartan (COZAAR) 100 MG tablet Take 100 mg by mouth in the morning. 01/12/19   [provider]  metoprolol succinate (TOPROL-XL) 50 MG 24 hr tablet Take 50 mg by mouth in the morning. 07/07/21   [provider]  ondansetron (ZOFRAN-ODT) 4 MG disintegrating tablet Take 4 mg by mouth every 8 (eight) hours as needed for nausea. Patient not taking: Reported on 11/23/2022 12/17/21   [provider]  pantoprazole (PROTONIX) 40 MG tablet Take 1 tablet (40 mg total) by mouth daily. Patient not taking: Reported on 11/23/2022 01/26/22   Mansouraty, Netty Starring., MD  polyethylene glycol (MIRALAX / GLYCOLAX) 17 g packet Take 17 g by mouth daily as needed for mild constipation. 12/03/22   Meredeth Ide, MD  potassium chloride SA (KLOR-CON M) 20 MEQ tablet Take 1 tablet (20 mEq total) by mouth daily. 05/28/22   Malachy Mood, MD  sennosides (SENOKOT) 8.8 MG/5ML syrup Take 5 mLs by mouth at bedtime. 12/03/22   Meredeth Ide, MD      Allergies    Ace inhibitors, Elavil [amitriptyline hcl], and Ketek [telithromycin]    Review of Systems   Review of Systems  Unable to perform ROS: Patient  nonverbal    Physical Exam Updated Vital Signs BP 114/68   Pulse 75   Temp 98.2 F (36.8 C) (Oral)   Resp 18   Ht 5\' 6"  (1.676 m)   Wt 54 kg   SpO2 100%   BMI 19.21 kg/m  Physical Exam Vitals and nursing note reviewed.  Constitutional:      General: She is not in acute distress.    Appearance: She is well-developed. She is not ill-appearing or diaphoretic.  HENT:     Head: Normocephalic and atraumatic.  Eyes:     Pupils: Pupils are equal, round, and reactive to light.  Cardiovascular:     Rate and Rhythm: Regular rhythm. Tachycardia present.     Heart sounds: Normal heart sounds.     Comments: HR~100. Pulmonary:     Effort: Pulmonary effort is normal.     Breath sounds:  Normal breath sounds. No wheezing.  Abdominal:     General: There is no distension.     Palpations: Abdomen is soft.     Tenderness: There is no abdominal tenderness.  Musculoskeletal:     Cervical back: No rigidity.  Skin:    General: Skin is warm and dry.  Neurological:     Mental Status: She is alert. She is disoriented.     Comments: Patient talks to me but often doesn't make sense. Is trying to say that she is in [place] but cannot tell me what building we are in or reliably tell me the date. No facial droop.  No slurred speech.  5/5 strength in all 4 extremities.  She does not follow commands well enough to do finger-to-nose testing.     ED Results / Procedures / Treatments   Labs (all labs ordered are listed, but only abnormal results are displayed) Labs Reviewed  CBC WITH DIFFERENTIAL/PLATELET - Abnormal; Notable for the following components:      Result Value   RBC 5.25 (*)    MCV 72.6 (*)    MCH 23.2 (*)    RDW 20.0 (*)    Platelets 57 (*)    Lymphs Abs 0.5 (*)    All other components within normal limits  COMPREHENSIVE METABOLIC PANEL - Abnormal; Notable for the following components:   CO2 20 (*)    Glucose, Bld 148 (*)    BUN 29 (*)    Creatinine, Ser 1.03 (*)    AST 67 (*)    Alkaline Phosphatase 376 (*)    Total Bilirubin 1.5 (*)    GFR, Estimated 54 (*)    All other components within normal limits  CBG MONITORING, ED - Abnormal; Notable for the following components:   Glucose-Capillary 117 (*)    All other components within normal limits  TROPONIN I (HIGH SENSITIVITY) - Abnormal; Notable for the following components:   Troponin I (High Sensitivity) 113 (*)    All other components within normal limits  TROPONIN I (HIGH SENSITIVITY) - Abnormal; Notable for the following components:   Troponin I (High Sensitivity) 117 (*)    All other components within normal limits  IMMATURE PLATELET FRACTION    EKG EKG Interpretation Date/Time:  Wednesday January 06 2023 17:30:16 EDT Ventricular Rate:  106 PR Interval:  188 QRS Duration:  125 QT Interval:  347 QTC Calculation: 461 R Axis:   -11  Text Interpretation: Sinus tachycardia Atrial premature complex Left bundle branch block Confirmed by Pricilla Loveless [phone]) on 01/06/2023 5:44:54 PM  Radiology DG Chest Portable 1  View  Result Date: 01/06/2023 CLINICAL DATA:  Syncopal episode EXAM: PORTABLE CHEST 1 VIEW COMPARISON:  11/23/2022 FINDINGS: Cardiac shadow is stable. Pacing device is again seen and stable. Lungs are well aerated bilaterally. Skin folds are noted bilaterally. No acute bony abnormality is seen. IMPRESSION: No active disease. Electronically Signed   By: Alcide Clever M.D.   On: 01/06/2023 18:49   CT Head Wo Contrast  Result Date: 01/06/2023 CLINICAL DATA:  Seizure, new onset. No history of trauma. Syncope episode. EXAM: CT HEAD WITHOUT CONTRAST TECHNIQUE: Contiguous axial images were obtained from the base of the skull through the vertex without intravenous contrast. RADIATION DOSE REDUCTION: This exam was performed according to the departmental dose-optimization program which includes automated exposure control, adjustment of the mA and/or kV according to patient size and/or use of iterative reconstruction technique. COMPARISON:  MRI examination dated November 24, 2022 FINDINGS: Brain: No evidence of acute infarction, hemorrhage, hydrocephalus, extra-axial collection or mass lesion/mass effect. Patchy areas of low-attenuation of the periventricular white matter presumed chronic microvascular ischemic changes. Vascular: No hyperdense vessel or unexpected calcification. Skull: Normal. Negative for fracture or focal lesion. Sinuses/Orbits: No acute finding. Other: None. IMPRESSION: 1. No acute intracranial abnormality. 2. Chronic microvascular ischemic changes of the white matter. Electronically Signed   By: Larose Hires D.O.   On: 01/06/2023 18:21    Procedures Procedures    Medications  Ordered in ED Medications  lactated ringers bolus 500 mL (0 mLs Intravenous Stopped 01/06/23 2302)    ED Course/ Medical Decision Making/ A&P                             Medical Decision Making Amount and/or Complexity of Data Reviewed Labs: ordered.    Details: Troponins elevated but flat and lower than when she was admitted last admission.  Mild bump in creatinine up to 1.03 with BUN elevation consistent with some dehydration. Radiology: ordered and independent interpretation performed.    Details: No head bleed.  No CHF. ECG/medicine tests: ordered and independent interpretation performed.    Details: Mild sinus tachycardia.   I suspect patient had a brief syncopal episode at least in part due to some dehydration.  She was mildly tachycardic on arrival.  After a small bolus of fluids given her known history of CHF, her heart rate is now back down to normal.  Her pacemaker was interrogated and she had a run of A-fib for a couple hours but that was earlier this morning and not correlating to the time that she passed out.  Troponins were sent given limited history and are elevated but much lower than when she was admitted and not consistent with ACS.  At this point, her vitals are normal and I suspect the dehydration played mostly the role.  Does not sound like a seizure based on history.  I discussed this with grandson as I cannot get back in touch with [name] though we had previously discussed discharging home if there was no clear cause otherwise.  I do not think she needs admission at this time and will give return precautions.        Final Clinical Impression(s) / ED Diagnoses Final diagnoses:  Syncope and collapse    Rx / DC Orders ED Discharge Orders     None         Pricilla Loveless, MD 01/06/23 2317

## 2023-01-06 NOTE — ED Notes (Signed)
[facility] called for transport.  

## 2023-01-06 NOTE — Discharge Instructions (Addendum)
Shannon Ortiz seems to be dehydrated today.  Be sure to increase her fluid intake at home.  There were no dangerous heart arrhythmias that caused her passing out today and no signs of a heart attack.  However if she develops recurrent passing out, change in mental status, or any other new/concerning symptoms then return to the ER or call 911.

## 2023-01-06 NOTE — ED Notes (Signed)
This RN called [employer] to make sure interrogation went through.

## 2023-01-06 NOTE — ED Notes (Signed)
Orthostatic VS done. Laying 118/77, HR 97. Sitting 119/87, HR 100. Unable to safely stand patient to get standing BP at this time.

## 2023-01-06 NOTE — ED Triage Notes (Addendum)
Pt BIB EMS from home after syncopal episode. Pt was helped down with episode per family did not fall or hit head. Hx stroke, pancreatic cancer

## 2023-01-07 DIAGNOSIS — E860 Dehydration: Secondary | ICD-10-CM | POA: Diagnosis not present

## 2023-01-07 DIAGNOSIS — Z743 Need for continuous supervision: Secondary | ICD-10-CM | POA: Diagnosis not present

## 2023-01-12 DIAGNOSIS — B370 Candidal stomatitis: Secondary | ICD-10-CM | POA: Diagnosis not present

## 2023-01-12 DIAGNOSIS — I10 Essential (primary) hypertension: Secondary | ICD-10-CM | POA: Diagnosis not present

## 2023-01-12 DIAGNOSIS — E46 Unspecified protein-calorie malnutrition: Secondary | ICD-10-CM | POA: Diagnosis not present

## 2023-01-12 DIAGNOSIS — C250 Malignant neoplasm of head of pancreas: Secondary | ICD-10-CM | POA: Diagnosis not present

## 2023-02-14 DEATH — deceased

## 2023-06-15 ENCOUNTER — Telehealth: Payer: Self-pay | Admitting: Internal Medicine

## 2023-06-15 NOTE — Telephone Encounter (Signed)
Per daughter, patient is deceased. Please call daughter to let her know what to do with monitor. She states she is making funeral arrangements for her brother as well, and if she is unable to answer she would like a detailed voice message if possible.

## 2023-06-16 NOTE — Telephone Encounter (Signed)
 Please see note below about monitor.

## 2023-06-18 NOTE — Telephone Encounter (Signed)
 I left a message on the daughter voicemail letting her know to call [employer] tech support to order a return kit for the monitor.

## 2023-07-07 IMAGING — CR DG ABDOMEN 1V
1 series · 2 of 2 positions shown · non-contrast
Comparison: Pelvic and left hip radiographs 08/17/2018.

CLINICAL DATA: Constipation.  No bowel movement for 1 week.

EXAM:
ABDOMEN - 1 VIEW

[Series 1: dg abd 1 view · 0.14mm/px · 2 of 2 slices shown]
[im 1/2]
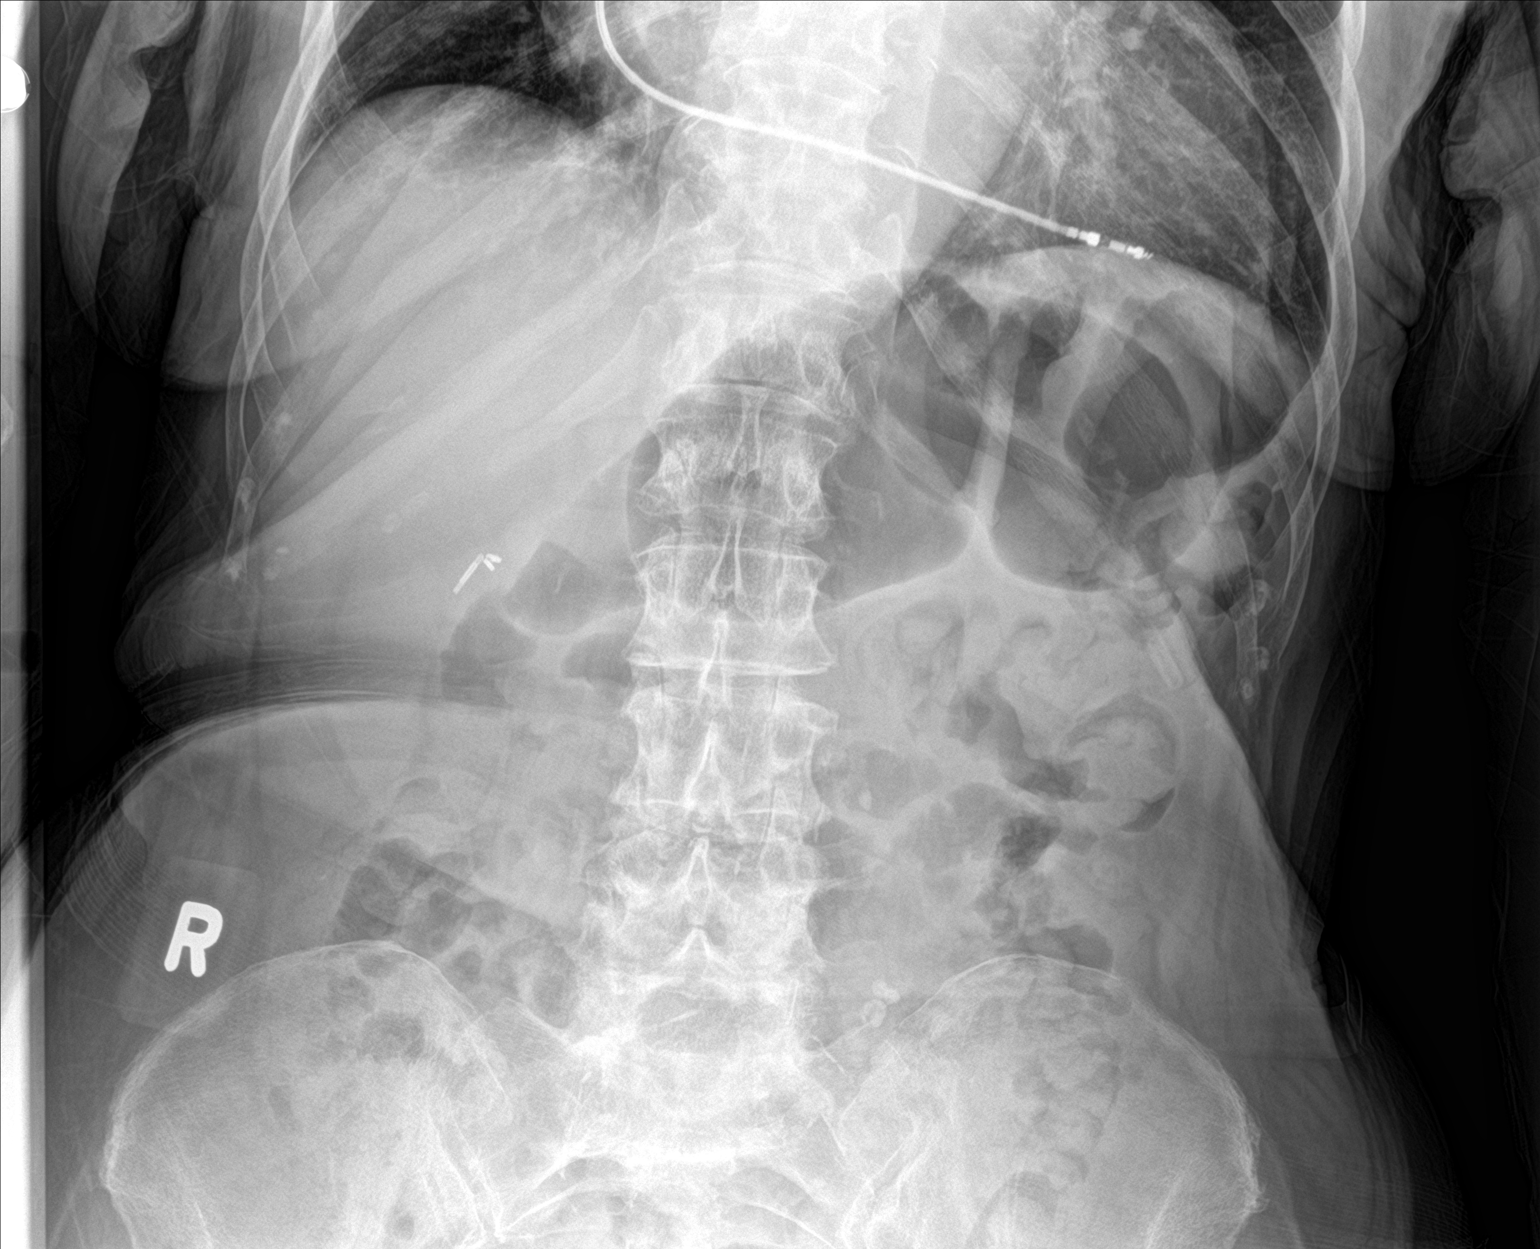
[im 2/2]
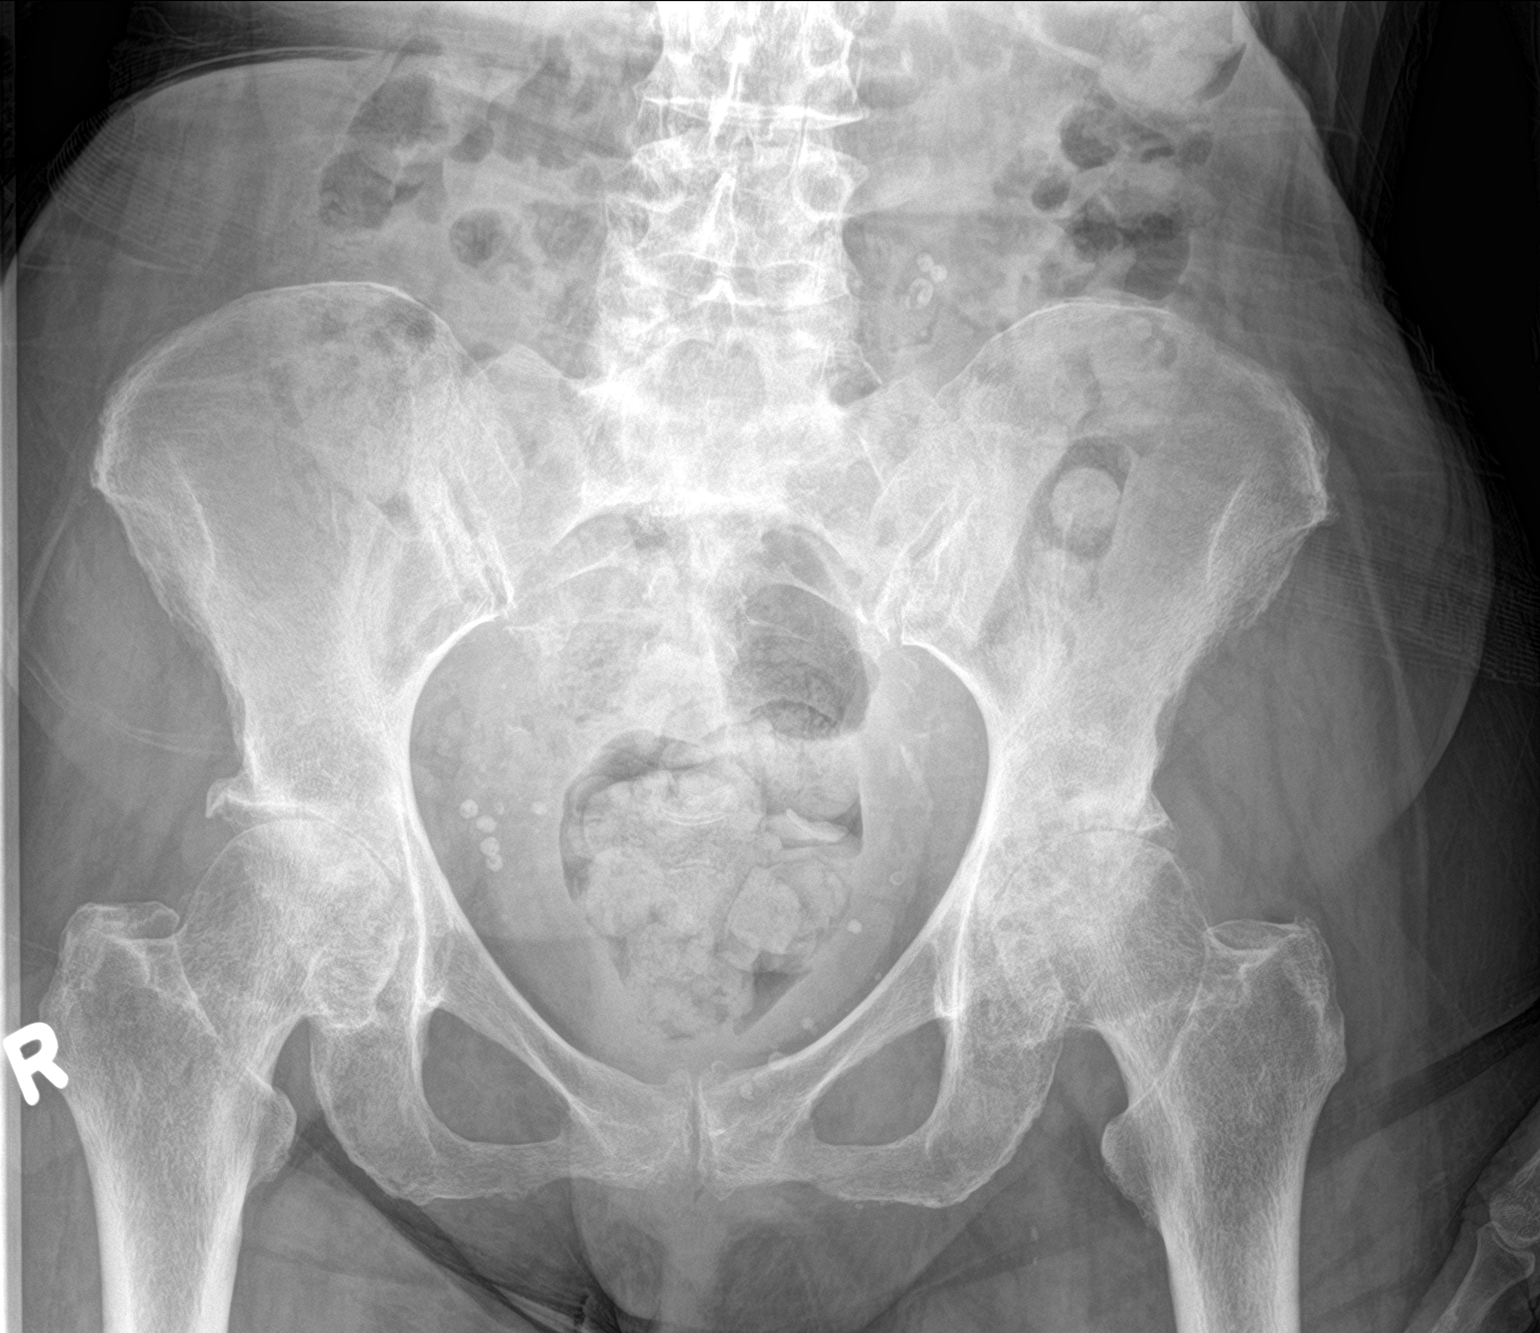

[2 of 2 positions shown; findings below may reference images not displayed]

FINDINGS: The bowel gas pattern is nonobstructive. There is a moderate amount
of stool throughout the colon, greatest distally. No supine evidence
of free intraperitoneal air or bowel wall thickening. Bilateral
pelvic calcifications are grossly unchanged. Moderately advanced
osteoarthritic changes at both hips. Patient has a pacemaker, mild
cardiomegaly and cholecystectomy clips.
IMPRESSION: Moderate colonic stool burden consistent with constipation. No
evidence of bowel obstruction. Bilateral hip osteoarthritis.
